# Patient Record
Sex: Female | Born: 1955 | Race: Black or African American | Hispanic: No | Smoking: Never smoker
Health system: Southern US, Community
[De-identification: ages and names within clinical notes are randomized; demographics above are authoritative.]

## PROBLEM LIST (undated history)

## (undated) DIAGNOSIS — I10 Essential (primary) hypertension: Secondary | ICD-10-CM

## (undated) DIAGNOSIS — D649 Anemia, unspecified: Secondary | ICD-10-CM

## (undated) DIAGNOSIS — M2550 Pain in unspecified joint: Secondary | ICD-10-CM

## (undated) DIAGNOSIS — M24112 Other articular cartilage disorders, left shoulder: Secondary | ICD-10-CM

## (undated) DIAGNOSIS — Z8619 Personal history of other infectious and parasitic diseases: Secondary | ICD-10-CM

## (undated) DIAGNOSIS — G43909 Migraine, unspecified, not intractable, without status migrainosus: Secondary | ICD-10-CM

## (undated) DIAGNOSIS — M0520 Rheumatoid vasculitis with rheumatoid arthritis of unspecified site: Secondary | ICD-10-CM

## (undated) DIAGNOSIS — R0681 Apnea, not elsewhere classified: Secondary | ICD-10-CM

## (undated) DIAGNOSIS — E119 Type 2 diabetes mellitus without complications: Secondary | ICD-10-CM

## (undated) DIAGNOSIS — T8859XA Other complications of anesthesia, initial encounter: Secondary | ICD-10-CM

## (undated) DIAGNOSIS — Z98811 Dental restoration status: Secondary | ICD-10-CM

## (undated) DIAGNOSIS — G5600 Carpal tunnel syndrome, unspecified upper limb: Secondary | ICD-10-CM

## (undated) DIAGNOSIS — M549 Dorsalgia, unspecified: Secondary | ICD-10-CM

## (undated) DIAGNOSIS — M7500 Adhesive capsulitis of unspecified shoulder: Secondary | ICD-10-CM

## (undated) DIAGNOSIS — M65332 Trigger finger, left middle finger: Secondary | ICD-10-CM

## (undated) DIAGNOSIS — E785 Hyperlipidemia, unspecified: Secondary | ICD-10-CM

## (undated) DIAGNOSIS — E669 Obesity, unspecified: Secondary | ICD-10-CM

## (undated) DIAGNOSIS — R011 Cardiac murmur, unspecified: Secondary | ICD-10-CM

## (undated) DIAGNOSIS — M51369 Other intervertebral disc degeneration, lumbar region without mention of lumbar back pain or lower extremity pain: Secondary | ICD-10-CM

## (undated) DIAGNOSIS — Z9289 Personal history of other medical treatment: Secondary | ICD-10-CM

## (undated) DIAGNOSIS — Z972 Presence of dental prosthetic device (complete) (partial): Secondary | ICD-10-CM

## (undated) DIAGNOSIS — M7542 Impingement syndrome of left shoulder: Secondary | ICD-10-CM

## (undated) DIAGNOSIS — E559 Vitamin D deficiency, unspecified: Secondary | ICD-10-CM

## (undated) DIAGNOSIS — M5136 Other intervertebral disc degeneration, lumbar region: Secondary | ICD-10-CM

## (undated) DIAGNOSIS — K219 Gastro-esophageal reflux disease without esophagitis: Secondary | ICD-10-CM

## (undated) DIAGNOSIS — R7303 Prediabetes: Secondary | ICD-10-CM

## (undated) DIAGNOSIS — T4145XA Adverse effect of unspecified anesthetic, initial encounter: Secondary | ICD-10-CM

## (undated) DIAGNOSIS — J302 Other seasonal allergic rhinitis: Secondary | ICD-10-CM

## (undated) DIAGNOSIS — G4730 Sleep apnea, unspecified: Secondary | ICD-10-CM

## (undated) DIAGNOSIS — R600 Localized edema: Secondary | ICD-10-CM

## (undated) HISTORY — DX: Essential (primary) hypertension: I10

## (undated) HISTORY — DX: Type 2 diabetes mellitus without complications: E11.9

## (undated) HISTORY — PX: COLONOSCOPY: SHX174

## (undated) HISTORY — DX: Apnea, not elsewhere classified: R06.81

## (undated) HISTORY — DX: Anemia, unspecified: D64.9

## (undated) HISTORY — DX: Personal history of other medical treatment: Z92.89

## (undated) HISTORY — DX: Dorsalgia, unspecified: M54.9

## (undated) HISTORY — PX: POLYPECTOMY: SHX149

## (undated) HISTORY — DX: Obesity, unspecified: E66.9

## (undated) HISTORY — DX: Rheumatoid vasculitis with rheumatoid arthritis of unspecified site: M05.20

## (undated) HISTORY — DX: Hyperlipidemia, unspecified: E78.5

## (undated) HISTORY — PX: TUBAL LIGATION: SHX77

## (undated) HISTORY — DX: Carpal tunnel syndrome, unspecified upper limb: G56.00

## (undated) HISTORY — DX: Gastro-esophageal reflux disease without esophagitis: K21.9

## (undated) HISTORY — DX: Pain in unspecified joint: M25.50

---

## 1997-11-04 ENCOUNTER — Ambulatory Visit (HOSPITAL_COMMUNITY): Admission: RE | Admit: 1997-11-04 | Discharge: 1997-11-04 | Payer: Self-pay | Admitting: Obstetrics

## 1997-11-09 ENCOUNTER — Other Ambulatory Visit: Admission: RE | Admit: 1997-11-09 | Discharge: 1997-11-09 | Payer: Self-pay | Admitting: Obstetrics & Gynecology

## 1997-11-12 ENCOUNTER — Ambulatory Visit (HOSPITAL_COMMUNITY): Admission: RE | Admit: 1997-11-12 | Discharge: 1997-11-12 | Payer: Self-pay | Admitting: Obstetrics

## 1997-11-18 ENCOUNTER — Inpatient Hospital Stay (HOSPITAL_COMMUNITY): Admission: RE | Admit: 1997-11-18 | Discharge: 1997-11-21 | Payer: Self-pay | Admitting: Obstetrics & Gynecology

## 1997-11-30 ENCOUNTER — Encounter: Admission: RE | Admit: 1997-11-30 | Discharge: 1997-11-30 | Payer: Self-pay | Admitting: Obstetrics & Gynecology

## 1997-12-28 ENCOUNTER — Encounter: Admission: RE | Admit: 1997-12-28 | Discharge: 1997-12-28 | Payer: Self-pay | Admitting: Obstetrics & Gynecology

## 1998-02-22 ENCOUNTER — Encounter: Admission: RE | Admit: 1998-02-22 | Discharge: 1998-02-22 | Payer: Self-pay | Admitting: Obstetrics & Gynecology

## 1998-05-06 ENCOUNTER — Ambulatory Visit (HOSPITAL_COMMUNITY): Admission: RE | Admit: 1998-05-06 | Discharge: 1998-05-06 | Payer: Self-pay | Admitting: Family Medicine

## 1998-05-06 ENCOUNTER — Encounter: Admission: RE | Admit: 1998-05-06 | Discharge: 1998-05-06 | Payer: Self-pay | Admitting: Family Medicine

## 1998-05-13 ENCOUNTER — Encounter: Admission: RE | Admit: 1998-05-13 | Discharge: 1998-05-13 | Payer: Self-pay | Admitting: Family Medicine

## 1998-05-24 ENCOUNTER — Ambulatory Visit (HOSPITAL_COMMUNITY): Admission: RE | Admit: 1998-05-24 | Discharge: 1998-05-24 | Payer: Self-pay | Admitting: Family Medicine

## 1998-06-08 ENCOUNTER — Encounter: Payer: Self-pay | Admitting: Family Medicine

## 1998-06-08 ENCOUNTER — Ambulatory Visit (HOSPITAL_COMMUNITY): Admission: RE | Admit: 1998-06-08 | Discharge: 1998-06-08 | Payer: Self-pay | Admitting: Family Medicine

## 1998-06-13 ENCOUNTER — Encounter: Admission: RE | Admit: 1998-06-13 | Discharge: 1998-06-13 | Payer: Self-pay | Admitting: Family Medicine

## 1998-07-08 ENCOUNTER — Encounter: Admission: RE | Admit: 1998-07-08 | Discharge: 1998-07-08 | Payer: Self-pay | Admitting: Family Medicine

## 1998-07-13 ENCOUNTER — Encounter: Admission: RE | Admit: 1998-07-13 | Discharge: 1998-07-13 | Payer: Self-pay | Admitting: Family Medicine

## 1998-07-27 ENCOUNTER — Encounter: Admission: RE | Admit: 1998-07-27 | Discharge: 1998-07-27 | Payer: Self-pay | Admitting: Family Medicine

## 1998-08-09 ENCOUNTER — Encounter: Admission: RE | Admit: 1998-08-09 | Discharge: 1998-11-07 | Payer: Self-pay | Admitting: Orthopedic Surgery

## 1998-11-18 ENCOUNTER — Ambulatory Visit (HOSPITAL_COMMUNITY): Admission: RE | Admit: 1998-11-18 | Discharge: 1998-11-18 | Payer: Self-pay | Admitting: Obstetrics

## 1998-11-18 ENCOUNTER — Encounter: Admission: RE | Admit: 1998-11-18 | Discharge: 1998-11-18 | Payer: Self-pay | Admitting: Obstetrics & Gynecology

## 1998-11-18 ENCOUNTER — Encounter: Payer: Self-pay | Admitting: Obstetrics

## 1998-12-26 ENCOUNTER — Ambulatory Visit (HOSPITAL_COMMUNITY): Admission: RE | Admit: 1998-12-26 | Discharge: 1998-12-26 | Payer: Self-pay | Admitting: *Deleted

## 1998-12-27 ENCOUNTER — Ambulatory Visit (HOSPITAL_BASED_OUTPATIENT_CLINIC_OR_DEPARTMENT_OTHER): Admission: RE | Admit: 1998-12-27 | Discharge: 1998-12-27 | Payer: Self-pay | Admitting: Orthopedic Surgery

## 1999-01-05 ENCOUNTER — Encounter: Admission: RE | Admit: 1999-01-05 | Discharge: 1999-03-10 | Payer: Self-pay | Admitting: Orthopedic Surgery

## 1999-02-10 ENCOUNTER — Ambulatory Visit (HOSPITAL_COMMUNITY): Admission: RE | Admit: 1999-02-10 | Discharge: 1999-02-10 | Payer: Self-pay | Admitting: *Deleted

## 1999-08-21 HISTORY — PX: VAGINAL HYSTERECTOMY: SUR661

## 2000-01-26 ENCOUNTER — Encounter: Payer: Self-pay | Admitting: Sports Medicine

## 2000-01-26 ENCOUNTER — Ambulatory Visit (HOSPITAL_COMMUNITY): Admission: RE | Admit: 2000-01-26 | Discharge: 2000-01-26 | Payer: Self-pay | Admitting: Obstetrics

## 2000-02-20 ENCOUNTER — Encounter: Admission: RE | Admit: 2000-02-20 | Discharge: 2000-02-20 | Payer: Self-pay | Admitting: Family Medicine

## 2000-05-17 ENCOUNTER — Encounter: Admission: RE | Admit: 2000-05-17 | Discharge: 2000-05-17 | Payer: Self-pay | Admitting: Family Medicine

## 2001-01-26 ENCOUNTER — Inpatient Hospital Stay (HOSPITAL_COMMUNITY): Admission: AD | Admit: 2001-01-26 | Discharge: 2001-01-26 | Payer: Self-pay | Admitting: *Deleted

## 2001-01-27 ENCOUNTER — Inpatient Hospital Stay (HOSPITAL_COMMUNITY): Admission: AD | Admit: 2001-01-27 | Discharge: 2001-01-27 | Payer: Self-pay | Admitting: Obstetrics & Gynecology

## 2001-01-27 ENCOUNTER — Encounter: Payer: Self-pay | Admitting: Obstetrics & Gynecology

## 2001-02-11 ENCOUNTER — Encounter: Admission: RE | Admit: 2001-02-11 | Discharge: 2001-02-11 | Payer: Self-pay | Admitting: Obstetrics & Gynecology

## 2001-02-25 ENCOUNTER — Ambulatory Visit (HOSPITAL_COMMUNITY): Admission: RE | Admit: 2001-02-25 | Discharge: 2001-02-25 | Payer: Self-pay | Admitting: Sports Medicine

## 2001-04-10 ENCOUNTER — Ambulatory Visit (HOSPITAL_COMMUNITY): Admission: RE | Admit: 2001-04-10 | Discharge: 2001-04-10 | Payer: Self-pay | Admitting: Obstetrics & Gynecology

## 2001-04-10 ENCOUNTER — Encounter: Payer: Self-pay | Admitting: Obstetrics & Gynecology

## 2001-05-26 ENCOUNTER — Encounter: Payer: Self-pay | Admitting: Emergency Medicine

## 2001-05-26 ENCOUNTER — Emergency Department (HOSPITAL_COMMUNITY): Admission: EM | Admit: 2001-05-26 | Discharge: 2001-05-26 | Payer: Self-pay | Admitting: Emergency Medicine

## 2001-09-30 ENCOUNTER — Ambulatory Visit (HOSPITAL_COMMUNITY): Admission: RE | Admit: 2001-09-30 | Discharge: 2001-09-30 | Payer: Self-pay | Admitting: Internal Medicine

## 2001-09-30 ENCOUNTER — Encounter: Payer: Self-pay | Admitting: Internal Medicine

## 2002-02-19 ENCOUNTER — Encounter: Payer: Self-pay | Admitting: Internal Medicine

## 2002-02-19 ENCOUNTER — Ambulatory Visit (HOSPITAL_COMMUNITY): Admission: RE | Admit: 2002-02-19 | Discharge: 2002-02-19 | Payer: Self-pay | Admitting: Internal Medicine

## 2002-03-17 ENCOUNTER — Ambulatory Visit (HOSPITAL_COMMUNITY): Admission: RE | Admit: 2002-03-17 | Discharge: 2002-03-17 | Payer: Self-pay | Admitting: *Deleted

## 2002-03-17 ENCOUNTER — Encounter: Payer: Self-pay | Admitting: *Deleted

## 2002-04-02 ENCOUNTER — Encounter: Payer: Self-pay | Admitting: Internal Medicine

## 2002-04-02 ENCOUNTER — Ambulatory Visit (HOSPITAL_COMMUNITY): Admission: RE | Admit: 2002-04-02 | Discharge: 2002-04-02 | Payer: Self-pay | Admitting: Internal Medicine

## 2002-04-24 ENCOUNTER — Emergency Department (HOSPITAL_COMMUNITY): Admission: EM | Admit: 2002-04-24 | Discharge: 2002-04-24 | Payer: Self-pay

## 2002-06-25 ENCOUNTER — Encounter: Admission: RE | Admit: 2002-06-25 | Discharge: 2002-06-25 | Payer: Self-pay | Admitting: Family Medicine

## 2003-02-03 ENCOUNTER — Encounter: Admission: RE | Admit: 2003-02-03 | Discharge: 2003-02-03 | Payer: Self-pay | Admitting: Family Medicine

## 2003-03-26 ENCOUNTER — Encounter: Admission: RE | Admit: 2003-03-26 | Discharge: 2003-03-26 | Payer: Self-pay | Admitting: Family Medicine

## 2003-08-31 ENCOUNTER — Emergency Department (HOSPITAL_COMMUNITY): Admission: AD | Admit: 2003-08-31 | Discharge: 2003-08-31 | Payer: Self-pay | Admitting: Family Medicine

## 2003-11-01 ENCOUNTER — Emergency Department (HOSPITAL_COMMUNITY): Admission: EM | Admit: 2003-11-01 | Discharge: 2003-11-01 | Payer: Self-pay | Admitting: Family Medicine

## 2003-12-08 ENCOUNTER — Emergency Department (HOSPITAL_COMMUNITY): Admission: EM | Admit: 2003-12-08 | Discharge: 2003-12-09 | Payer: Self-pay | Admitting: Podiatry

## 2003-12-09 ENCOUNTER — Encounter: Admission: RE | Admit: 2003-12-09 | Discharge: 2003-12-09 | Payer: Self-pay | Admitting: Family Medicine

## 2004-02-23 ENCOUNTER — Ambulatory Visit (HOSPITAL_COMMUNITY): Admission: RE | Admit: 2004-02-23 | Discharge: 2004-02-23 | Payer: Self-pay | Admitting: Obstetrics & Gynecology

## 2004-06-27 ENCOUNTER — Emergency Department (HOSPITAL_COMMUNITY): Admission: EM | Admit: 2004-06-27 | Discharge: 2004-06-27 | Payer: Self-pay | Admitting: Family Medicine

## 2004-10-03 ENCOUNTER — Ambulatory Visit: Payer: Self-pay | Admitting: Sports Medicine

## 2005-01-10 ENCOUNTER — Ambulatory Visit: Payer: Self-pay | Admitting: *Deleted

## 2005-01-18 ENCOUNTER — Encounter (INDEPENDENT_AMBULATORY_CARE_PROVIDER_SITE_OTHER): Payer: Self-pay | Admitting: *Deleted

## 2005-01-18 LAB — CONVERTED CEMR LAB

## 2005-01-19 ENCOUNTER — Ambulatory Visit: Payer: Self-pay | Admitting: Family Medicine

## 2005-02-26 ENCOUNTER — Ambulatory Visit: Payer: Self-pay | Admitting: Family Medicine

## 2005-08-21 ENCOUNTER — Ambulatory Visit (HOSPITAL_COMMUNITY): Admission: RE | Admit: 2005-08-21 | Discharge: 2005-08-21 | Payer: Self-pay | Admitting: Obstetrics & Gynecology

## 2005-08-27 ENCOUNTER — Ambulatory Visit: Payer: Self-pay | Admitting: Family Medicine

## 2005-09-06 ENCOUNTER — Ambulatory Visit: Payer: Self-pay | Admitting: Family Medicine

## 2005-09-08 ENCOUNTER — Emergency Department (HOSPITAL_COMMUNITY): Admission: EM | Admit: 2005-09-08 | Discharge: 2005-09-08 | Payer: Self-pay | Admitting: Family Medicine

## 2005-09-10 ENCOUNTER — Ambulatory Visit: Payer: Self-pay | Admitting: Sports Medicine

## 2005-09-30 ENCOUNTER — Emergency Department (HOSPITAL_COMMUNITY): Admission: EM | Admit: 2005-09-30 | Discharge: 2005-09-30 | Payer: Self-pay | Admitting: Family Medicine

## 2005-10-10 ENCOUNTER — Ambulatory Visit: Payer: Self-pay | Admitting: Sports Medicine

## 2006-01-08 ENCOUNTER — Ambulatory Visit: Payer: Self-pay | Admitting: Sports Medicine

## 2006-01-10 ENCOUNTER — Ambulatory Visit: Payer: Self-pay | Admitting: Sports Medicine

## 2006-01-11 ENCOUNTER — Emergency Department (HOSPITAL_COMMUNITY): Admission: EM | Admit: 2006-01-11 | Discharge: 2006-01-11 | Payer: Self-pay | Admitting: Family Medicine

## 2006-01-29 ENCOUNTER — Encounter: Admission: RE | Admit: 2006-01-29 | Discharge: 2006-01-29 | Payer: Self-pay | Admitting: Occupational Medicine

## 2006-02-13 ENCOUNTER — Emergency Department (HOSPITAL_COMMUNITY): Admission: EM | Admit: 2006-02-13 | Discharge: 2006-02-13 | Payer: Self-pay | Admitting: Emergency Medicine

## 2006-02-19 ENCOUNTER — Encounter: Payer: Self-pay | Admitting: Vascular Surgery

## 2006-02-19 ENCOUNTER — Ambulatory Visit (HOSPITAL_COMMUNITY): Admission: RE | Admit: 2006-02-19 | Discharge: 2006-02-19 | Payer: Self-pay | Admitting: Internal Medicine

## 2006-02-28 ENCOUNTER — Ambulatory Visit: Payer: Self-pay | Admitting: Family Medicine

## 2006-04-25 ENCOUNTER — Ambulatory Visit (HOSPITAL_BASED_OUTPATIENT_CLINIC_OR_DEPARTMENT_OTHER): Admission: RE | Admit: 2006-04-25 | Discharge: 2006-04-25 | Payer: Self-pay | Admitting: Orthopedic Surgery

## 2006-04-25 HISTORY — PX: CARPAL TUNNEL RELEASE: SHX101

## 2006-08-16 ENCOUNTER — Emergency Department (HOSPITAL_COMMUNITY): Admission: EM | Admit: 2006-08-16 | Discharge: 2006-08-16 | Payer: Self-pay | Admitting: Emergency Medicine

## 2006-08-21 ENCOUNTER — Emergency Department (HOSPITAL_COMMUNITY): Admission: EM | Admit: 2006-08-21 | Discharge: 2006-08-21 | Payer: Self-pay | Admitting: Family Medicine

## 2006-09-05 ENCOUNTER — Ambulatory Visit: Payer: Self-pay | Admitting: Family Medicine

## 2006-10-17 DIAGNOSIS — G5601 Carpal tunnel syndrome, right upper limb: Secondary | ICD-10-CM | POA: Insufficient documentation

## 2006-10-17 DIAGNOSIS — E669 Obesity, unspecified: Secondary | ICD-10-CM | POA: Insufficient documentation

## 2006-10-17 DIAGNOSIS — D509 Iron deficiency anemia, unspecified: Secondary | ICD-10-CM | POA: Insufficient documentation

## 2006-10-18 ENCOUNTER — Encounter (INDEPENDENT_AMBULATORY_CARE_PROVIDER_SITE_OTHER): Payer: Self-pay | Admitting: *Deleted

## 2006-11-28 ENCOUNTER — Ambulatory Visit (HOSPITAL_COMMUNITY): Admission: RE | Admit: 2006-11-28 | Discharge: 2006-11-28 | Payer: Self-pay | Admitting: Obstetrics & Gynecology

## 2007-02-25 ENCOUNTER — Ambulatory Visit: Payer: Self-pay | Admitting: Gastroenterology

## 2007-08-22 ENCOUNTER — Emergency Department (HOSPITAL_COMMUNITY): Admission: EM | Admit: 2007-08-22 | Discharge: 2007-08-22 | Payer: Self-pay | Admitting: Emergency Medicine

## 2007-08-25 ENCOUNTER — Encounter (INDEPENDENT_AMBULATORY_CARE_PROVIDER_SITE_OTHER): Payer: Self-pay | Admitting: Family Medicine

## 2007-08-25 ENCOUNTER — Ambulatory Visit: Payer: Self-pay | Admitting: Sports Medicine

## 2007-08-29 ENCOUNTER — Telehealth (INDEPENDENT_AMBULATORY_CARE_PROVIDER_SITE_OTHER): Payer: Self-pay | Admitting: Family Medicine

## 2007-08-29 LAB — CONVERTED CEMR LAB
BUN: 13 mg/dL (ref 6–23)
Calcium: 8.8 mg/dL (ref 8.4–10.5)

## 2007-09-02 ENCOUNTER — Ambulatory Visit: Payer: Self-pay | Admitting: Family Medicine

## 2007-09-02 ENCOUNTER — Encounter (INDEPENDENT_AMBULATORY_CARE_PROVIDER_SITE_OTHER): Payer: Self-pay | Admitting: Family Medicine

## 2007-09-02 DIAGNOSIS — E2839 Other primary ovarian failure: Secondary | ICD-10-CM | POA: Insufficient documentation

## 2007-09-02 DIAGNOSIS — E559 Vitamin D deficiency, unspecified: Secondary | ICD-10-CM | POA: Insufficient documentation

## 2007-09-30 ENCOUNTER — Encounter: Payer: Self-pay | Admitting: *Deleted

## 2007-10-08 ENCOUNTER — Encounter (INDEPENDENT_AMBULATORY_CARE_PROVIDER_SITE_OTHER): Payer: Self-pay | Admitting: Family Medicine

## 2008-01-07 ENCOUNTER — Telehealth: Payer: Self-pay | Admitting: *Deleted

## 2008-01-07 ENCOUNTER — Ambulatory Visit: Payer: Self-pay | Admitting: Family Medicine

## 2008-01-21 ENCOUNTER — Encounter: Payer: Self-pay | Admitting: *Deleted

## 2008-03-04 ENCOUNTER — Ambulatory Visit (HOSPITAL_COMMUNITY): Admission: RE | Admit: 2008-03-04 | Discharge: 2008-03-04 | Payer: Self-pay | Admitting: Obstetrics & Gynecology

## 2008-03-09 ENCOUNTER — Encounter (INDEPENDENT_AMBULATORY_CARE_PROVIDER_SITE_OTHER): Payer: Self-pay | Admitting: Family Medicine

## 2008-03-09 ENCOUNTER — Ambulatory Visit: Payer: Self-pay | Admitting: Family Medicine

## 2008-03-09 DIAGNOSIS — G43711 Chronic migraine without aura, intractable, with status migrainosus: Secondary | ICD-10-CM | POA: Insufficient documentation

## 2008-03-09 LAB — CONVERTED CEMR LAB
BUN: 11 mg/dL (ref 6–23)
Bilirubin Urine: NEGATIVE
Calcium: 9 mg/dL (ref 8.4–10.5)
Glucose, Urine, Semiquant: NEGATIVE
Ketones, urine, test strip: NEGATIVE
Potassium: 4.3 meq/L (ref 3.5–5.3)
Protein, U semiquant: NEGATIVE
Specific Gravity, Urine: 1.02
Vit D, 1,25-Dihydroxy: 20 — ABNORMAL LOW (ref 30–89)

## 2008-03-11 ENCOUNTER — Encounter (INDEPENDENT_AMBULATORY_CARE_PROVIDER_SITE_OTHER): Payer: Self-pay | Admitting: Family Medicine

## 2008-03-12 ENCOUNTER — Telehealth: Payer: Self-pay | Admitting: *Deleted

## 2008-03-16 ENCOUNTER — Encounter (INDEPENDENT_AMBULATORY_CARE_PROVIDER_SITE_OTHER): Payer: Self-pay | Admitting: Family Medicine

## 2008-07-26 ENCOUNTER — Emergency Department (HOSPITAL_COMMUNITY): Admission: EM | Admit: 2008-07-26 | Discharge: 2008-07-26 | Payer: Self-pay | Admitting: Emergency Medicine

## 2008-08-20 HISTORY — PX: ROTATOR CUFF REPAIR: SHX139

## 2008-08-24 ENCOUNTER — Ambulatory Visit: Payer: Self-pay | Admitting: Sports Medicine

## 2008-09-09 ENCOUNTER — Ambulatory Visit: Payer: Self-pay | Admitting: Sports Medicine

## 2008-09-14 ENCOUNTER — Encounter (INDEPENDENT_AMBULATORY_CARE_PROVIDER_SITE_OTHER): Payer: Self-pay | Admitting: *Deleted

## 2008-09-16 ENCOUNTER — Ambulatory Visit (HOSPITAL_COMMUNITY): Admission: RE | Admit: 2008-09-16 | Discharge: 2008-09-16 | Payer: Self-pay | Admitting: Sports Medicine

## 2008-09-22 ENCOUNTER — Encounter (INDEPENDENT_AMBULATORY_CARE_PROVIDER_SITE_OTHER): Payer: Self-pay | Admitting: *Deleted

## 2008-09-30 ENCOUNTER — Encounter (INDEPENDENT_AMBULATORY_CARE_PROVIDER_SITE_OTHER): Payer: Self-pay | Admitting: *Deleted

## 2008-10-04 ENCOUNTER — Ambulatory Visit (HOSPITAL_BASED_OUTPATIENT_CLINIC_OR_DEPARTMENT_OTHER): Admission: RE | Admit: 2008-10-04 | Discharge: 2008-10-04 | Payer: Self-pay | Admitting: Orthopedic Surgery

## 2008-10-04 HISTORY — PX: SHOULDER ARTHROSCOPY WITH ROTATOR CUFF REPAIR: SHX5685

## 2008-10-06 ENCOUNTER — Encounter: Admission: RE | Admit: 2008-10-06 | Discharge: 2009-01-04 | Payer: Self-pay | Admitting: Orthopedic Surgery

## 2008-10-08 ENCOUNTER — Telehealth: Payer: Self-pay | Admitting: Family Medicine

## 2008-10-08 ENCOUNTER — Encounter: Payer: Self-pay | Admitting: Family Medicine

## 2008-10-08 ENCOUNTER — Ambulatory Visit: Payer: Self-pay | Admitting: Vascular Surgery

## 2008-10-08 ENCOUNTER — Ambulatory Visit: Admission: RE | Admit: 2008-10-08 | Discharge: 2008-10-08 | Payer: Self-pay | Admitting: Family Medicine

## 2008-10-08 ENCOUNTER — Ambulatory Visit: Payer: Self-pay | Admitting: Family Medicine

## 2008-10-08 DIAGNOSIS — R609 Edema, unspecified: Secondary | ICD-10-CM | POA: Insufficient documentation

## 2008-11-22 ENCOUNTER — Encounter (INDEPENDENT_AMBULATORY_CARE_PROVIDER_SITE_OTHER): Payer: Self-pay | Admitting: Family Medicine

## 2008-11-22 ENCOUNTER — Ambulatory Visit: Payer: Self-pay | Admitting: Family Medicine

## 2008-11-22 ENCOUNTER — Ambulatory Visit: Payer: Self-pay

## 2008-11-22 LAB — CONVERTED CEMR LAB
ALT: 11 units/L (ref 0–35)
AST: 15 units/L (ref 0–37)
Blood in Urine, dipstick: NEGATIVE
Chloride: 107 meq/L (ref 96–112)
Creatinine, Ser: 0.99 mg/dL (ref 0.40–1.20)
Ketones, urine, test strip: NEGATIVE
Specific Gravity, Urine: 1.02
Total Bilirubin: 0.3 mg/dL (ref 0.3–1.2)
Total Protein: 7.1 g/dL (ref 6.0–8.3)
pH: 7

## 2008-11-23 ENCOUNTER — Telehealth (INDEPENDENT_AMBULATORY_CARE_PROVIDER_SITE_OTHER): Payer: Self-pay | Admitting: Family Medicine

## 2008-11-23 ENCOUNTER — Encounter: Payer: Self-pay | Admitting: Sports Medicine

## 2008-11-25 ENCOUNTER — Encounter: Payer: Self-pay | Admitting: Cardiovascular Disease

## 2008-11-25 ENCOUNTER — Ambulatory Visit: Payer: Self-pay | Admitting: Cardiovascular Disease

## 2008-11-25 LAB — CONVERTED CEMR LAB
Basophils Absolute: 0 10*3/uL (ref 0.0–0.1)
Basophils Relative: 0 % (ref 0.0–3.0)
Eosinophils Absolute: 0.1 10*3/uL (ref 0.0–0.7)
Free T4: 0.8 ng/dL (ref 0.6–1.6)
HCT: 39.3 % (ref 36.0–46.0)
Hemoglobin: 12.6 g/dL (ref 12.0–15.0)
Lymphocytes Relative: 33.6 % (ref 12.0–46.0)
Monocytes Relative: 6.7 % (ref 3.0–12.0)
RBC: 5.56 M/uL — ABNORMAL HIGH (ref 3.87–5.11)
Sed Rate: 17 mm/hr (ref 0–22)

## 2009-01-05 ENCOUNTER — Encounter: Admission: RE | Admit: 2009-01-05 | Discharge: 2009-01-20 | Payer: Self-pay | Admitting: Orthopedic Surgery

## 2009-04-11 ENCOUNTER — Ambulatory Visit: Payer: Self-pay | Admitting: Cardiovascular Disease

## 2009-04-11 DIAGNOSIS — I10 Essential (primary) hypertension: Secondary | ICD-10-CM | POA: Insufficient documentation

## 2009-04-20 LAB — CONVERTED CEMR LAB
Chloride: 107 meq/L (ref 96–112)
Creatinine, Ser: 0.9 mg/dL (ref 0.4–1.2)
Potassium: 3.6 meq/L (ref 3.5–5.1)

## 2009-05-27 ENCOUNTER — Ambulatory Visit (HOSPITAL_COMMUNITY): Admission: RE | Admit: 2009-05-27 | Discharge: 2009-05-27 | Payer: Self-pay | Admitting: Obstetrics & Gynecology

## 2009-07-02 ENCOUNTER — Emergency Department (HOSPITAL_COMMUNITY): Admission: EM | Admit: 2009-07-02 | Discharge: 2009-07-02 | Payer: Self-pay | Admitting: Family Medicine

## 2009-07-04 ENCOUNTER — Ambulatory Visit: Payer: Self-pay | Admitting: Family Medicine

## 2009-07-04 ENCOUNTER — Encounter: Payer: Self-pay | Admitting: Family Medicine

## 2009-07-07 ENCOUNTER — Encounter: Payer: Self-pay | Admitting: Family Medicine

## 2009-07-07 ENCOUNTER — Ambulatory Visit: Payer: Self-pay | Admitting: Family Medicine

## 2009-07-07 LAB — CONVERTED CEMR LAB
ALT: 14 units/L (ref 0–35)
AST: 25 units/L (ref 0–37)
Albumin: 3.9 g/dL (ref 3.5–5.2)
Alkaline Phosphatase: 96 units/L (ref 39–117)
Calcium: 9.1 mg/dL (ref 8.4–10.5)
Creatinine, Ser: 0.85 mg/dL (ref 0.40–1.20)
Glucose, Bld: 81 mg/dL (ref 70–99)
HCT: 38.8 % (ref 36.0–46.0)
Hemoglobin: 11.9 g/dL — ABNORMAL LOW (ref 12.0–15.0)
MCHC: 30.7 g/dL (ref 30.0–36.0)
RBC: 5.56 M/uL — ABNORMAL HIGH (ref 3.87–5.11)
RDW: 15.4 % (ref 11.5–15.5)
Total Bilirubin: 0.3 mg/dL (ref 0.3–1.2)
WBC: 9.5 10*3/uL (ref 4.0–10.5)

## 2009-09-26 ENCOUNTER — Ambulatory Visit: Payer: Self-pay | Admitting: Family Medicine

## 2009-09-26 ENCOUNTER — Encounter: Payer: Self-pay | Admitting: Family Medicine

## 2009-09-26 LAB — CONVERTED CEMR LAB
Glucose, Urine, Semiquant: NEGATIVE
HDL: 38 mg/dL — ABNORMAL LOW (ref >39)
Ketones, urine, test strip: NEGATIVE
Protein, U semiquant: NEGATIVE
Urobilinogen, UA: 1
pH: 6.5

## 2009-12-28 ENCOUNTER — Ambulatory Visit: Payer: Self-pay | Admitting: Sports Medicine

## 2010-01-05 ENCOUNTER — Ambulatory Visit: Payer: Self-pay | Admitting: Sports Medicine

## 2010-01-21 ENCOUNTER — Emergency Department (HOSPITAL_COMMUNITY): Admission: EM | Admit: 2010-01-21 | Discharge: 2010-01-21 | Payer: Self-pay | Admitting: Emergency Medicine

## 2010-01-24 ENCOUNTER — Encounter: Admission: RE | Admit: 2010-01-24 | Discharge: 2010-03-01 | Payer: Self-pay | Admitting: Sports Medicine

## 2010-01-24 ENCOUNTER — Encounter: Payer: Self-pay | Admitting: Family Medicine

## 2010-01-26 ENCOUNTER — Telehealth: Payer: Self-pay | Admitting: Family Medicine

## 2010-03-13 ENCOUNTER — Ambulatory Visit: Payer: Self-pay | Admitting: Family Medicine

## 2010-03-30 ENCOUNTER — Ambulatory Visit: Payer: Self-pay | Admitting: Sports Medicine

## 2010-06-28 ENCOUNTER — Encounter: Payer: Self-pay | Admitting: Family Medicine

## 2010-07-04 ENCOUNTER — Ambulatory Visit (HOSPITAL_COMMUNITY): Admission: RE | Admit: 2010-07-04 | Discharge: 2010-07-04 | Payer: Self-pay | Admitting: Obstetrics & Gynecology

## 2010-08-01 ENCOUNTER — Emergency Department (HOSPITAL_COMMUNITY)
Admission: EM | Admit: 2010-08-01 | Discharge: 2010-08-01 | Payer: Self-pay | Source: Home / Self Care | Admitting: Emergency Medicine

## 2010-09-10 ENCOUNTER — Encounter: Payer: Self-pay | Admitting: Obstetrics & Gynecology
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
## 2010-09-21 NOTE — Assessment & Plan Note (Signed)
Summary: F/U BACK/L LEG,[facility]   Vital Signs:  Patient profile:   55 year old female BP sitting:   128 / 84  Primary Provider:  Cat Ta MD   History of Present Illness: Mobic helped pain a lot flexeril - can only take at nite 2/2 drowsiness feels that she is moving beter pain in LB still radiates into left leg stops at knee no weakness no red flags  here for reck  Allergies: No Known Drug Allergies  Physical Exam  General:  Obese,well-nourished,in no acute distress; alert,appropriate and cooperative throughout examination Msk:  + SLR on left neg on RT pain w flex at 80 deg with ext at 20 deg some on lat bending none on Rotation  toe, heel and tandem walk OK  reflex dec at left knee and ankle and at rt knee   Impression & Recommendations:  Problem # 1:  LOW BACK PAIN, ACUTE (ICD-724.2)  The following medications were removed from the medication list:    Naprosyn 500 Mg Tabs (Naproxen) .[name]... 1 tablet by mouth twice a day Her updated medication list for this problem includes:    Tgt Ibuprofen 200 Mg Tabs (Ibuprofen) .[name]... Take 2 tablet by mouth every six hours    Vicodin 5-500 Mg Tabs (Hydrocodone-acetaminophen) .[name]... 1 tablet by mouth every six hours    Flexeril 10 Mg Tabs (Cyclobenzaprine hcl) .[name]... 1 by mouth qhs    Flexeril 10 Mg Tabs (Cyclobenzaprine hcl) .[name]... 1 tab by mouth qhs as needed spasm    Mobic 15 Mg Tabs (Meloxicam) .[name]... 1 by mouth with food as needed back pain  this is improved but I think she needs to stay on some mobic to work  Problem # 2:  LUMBAR RADICULOPATHY, LEFT (ICD-724.4)  The following medications were removed from the medication list:    Naprosyn 500 Mg Tabs (Naproxen) .[name]... 1 tablet by mouth twice a day Her updated medication list for this problem includes:    Tgt Ibuprofen 200 Mg Tabs (Ibuprofen) .[name]... Take 2 tablet by mouth every six hours    Vicodin 5-500 Mg Tabs (Hydrocodone-acetaminophen) .[name]... 1 tablet by mouth every six  hours    Flexeril 10 Mg Tabs (Cyclobenzaprine hcl) .[name]... 1 by mouth qhs    Flexeril 10 Mg Tabs (Cyclobenzaprine hcl) .[name]... 1 tab by mouth qhs as needed spasm    Mobic 15 Mg Tabs (Meloxicam) .[name]... 1 by mouth with food as needed back pain  cont on flexeril at night  begin PT  I don't think imaging would help unless worse  this seems disk related not a good surg candiadate  reck by dr Fredric Mare 4 wks  Complete Medication List: 1)  Protonix 40 Mg Tbec (Pantoprazole sodium) .... Take 1 tablet by mouth once a day 2)  Tgt Ibuprofen 200 Mg Tabs (Ibuprofen) .... Take 2 tablet by mouth every six hours 3)  Vicodin 5-500 Mg Tabs (Hydrocodone-acetaminophen) .[name].. 1 tablet by mouth every six hours 4)  Imitrex 25 Mg Tabs (Sumatriptan succinate) .[name].. 1 by mouth may repeat in 1 hour 5)  Vitamin D 69629 Unit Caps (Ergocalciferol) .[name].. 1 by mouth weekly for 8 weeks 6)  Lisinopril 10 Mg Tabs (Lisinopril) .[name].. 1 by mouth daily 7)  Flexeril 10 Mg Tabs (Cyclobenzaprine hcl) .[name].. 1 by mouth qhs 8)  Nitroglycerin 0.2 Mg/hr Pt24 (Nitroglycerin) .... Wear 1/4 patch 12 hours on, then 12 hours off 9)  Iron 325 (65 Fe) Mg Tabs (Ferrous sulfate) .[name].. 1 tab by mouth  two times a day for iron deficiency 10)  Colace 100 Mg Caps (Docusate sodium) .[name].. 1 tab by mouth bid 11)  Flexeril 10 Mg Tabs (Cyclobenzaprine hcl) .[name].. 1 tab by mouth qhs as needed spasm 12)  Mobic 15 Mg Tabs (Meloxicam) .[name].. 1 by mouth with food as needed back pain Prescriptions: FLEXERIL 10 MG TABS (CYCLOBENZAPRINE HCL) 1 by mouth qhs  #30 x 2   Entered and Authorized by:   Enid Baas MD   Signed by:   Enid Baas MD on 01/05/2010   Method used:   Electronically to        [facility]* (retail)       [address]       [address] Shipping/mailing       [place], [place]  [address]       Ph: [phone]       Fax: [phone]   RxID:   [id number] MOBIC 15 MG TABS (MELOXICAM) 1 by mouth with food as needed back pain  #30  x 2   Entered and Authorized by:   Enid Baas MD   Signed by:   Enid Baas MD on 01/05/2010   Method used:   Electronically to        [facility]* (retail)       [address]       [address] Shipping/mailing       [place], [place]  [address]       Ph: [phone]       Fax: [phone]   RxID:   [id number]

## 2010-09-21 NOTE — Miscellaneous (Signed)
Summary: Changing Prob List    Clinical Lists Changes  Problems: Removed problem of LOW BACK PAIN, ACUTE (ICD-724.2) Removed problem of NECK PAIN, ACUTE (ICD-723.1) Removed problem of DYSPNEA (ICD-786.05) Removed problem of SHOULDER PAIN, RIGHT (ICD-719.41) Removed problem of BACK PAIN (ICD-724.5) Removed problem of URINARY URGENCY (ZOX-096.04) Removed problem of HEADACHE (ICD-784.0) Removed problem of ELBOW PAIN NOS (ICD-719.42) Removed problem of SCREENING FOR MALIGNANT NEOPLASM OF THE CERVIX (ICD-V76.2) Removed problem of ENCOUNTER FOR LONG-TERM USE OF OTHER MEDICATIONS (ICD-V58.69) Removed problem of LUMBAR RADICULOPATHY, LEFT (ICD-724.4) Removed problem of HYPERGLYCEMIA (ICD-790.29) Removed problem of ARTHRITIS (ICD-716.90) Observations: Added new observation of PAST MED HX: Current Problems:  HTN GERD Migraine HA Multiple pain complaints: back, elbow, neck, etc  ARTHRITIS (ICD-716.90) VITAMIN D DEFICIENCY (ICD-268.9) OVARIAN FAILURE (ICD-256.39) OBESITY, NOS (ICD-278.00) CARPAL TUNNEL SYNDROME (ICD-354.0) ANEMIA, IRON DEFICIENCY, UNSPEC. (ICD-280.9)   (06/28/2010 21:09) Added new observation of PRIMARY MD: Cat Ta MD (06/28/2010 21:09)       Past History:  Past Medical History: Current Problems:  HTN GERD Migraine HA Multiple pain complaints: back, elbow, neck, etc  ARTHRITIS (ICD-716.90) VITAMIN D DEFICIENCY (ICD-268.9) OVARIAN FAILURE (ICD-256.39) OBESITY, NOS (ICD-278.00) CARPAL TUNNEL SYNDROME (ICD-354.0) ANEMIA, IRON DEFICIENCY, UNSPEC. (ICD-280.9)

## 2010-09-21 NOTE — Miscellaneous (Signed)
Summary: [facility]  [facility]   Imported By: Marily Memos 01/27/2010 11:06:06  _____________________________________________________________________  External Attachment:    Type:   Image     Comment:   External Document

## 2010-09-21 NOTE — Progress Notes (Signed)
Summary: triage   Phone Note Call from Patient Call back at Work Phone [phone]   Caller: Patient Summary of Call: Pt seen at urgent care for uti and started on cipro now thinks she has yeast infection. Initial call taken by: Clydell Hakim,  January 26, 2010 11:24 AM  Follow-up for Phone Call        went to [facility] saturday. was given antibiotics. now has yeast infection. wants [name] to call something in for that. told her she would have to be seen since we did not rx the med. she will call [facility] & see if they will call it in.  Follow-up by: Golden Circle RN,  January 26, 2010 11:31 AM

## 2010-09-21 NOTE — Assessment & Plan Note (Signed)
Summary: F/U [facility]   Vital Signs:  Patient profile:   55 year old female Height:      63 inches Pulse rate:   63 / minute BP sitting:   114 / 75  (right arm)  Vitals Entered By: Tessie Fass CMA (March 13, 2010 4:19 PM) CC: F/U back and leg   Primary Care Provider:  Cat Ta MD  CC:  F/U back and leg.  History of Present Illness: f/u low back pain--has completed PT and not much improvement. She thinks her pain in the very low back is actually aggravated by the PT. She is quite frustrated no new symptoms. Pain is still 4-5 / 10 esp after she stands or sits for a long time. No radiation into legs. No incontinence  Current Medications (verified): 1)  Protonix 40 Mg Tbec (Pantoprazole Sodium) .... Take 1 Tablet By Mouth Once A Day 2)  Imitrex 25 Mg  Tabs (Sumatriptan Succinate) .[name].. 1 By Mouth May Repeat in 1 Hour 3)  Vitamin D 16109 Unit  Caps (Ergocalciferol) .[name].. 1 By Mouth Weekly For 8 Weeks 4)  Lisinopril 10 Mg  Tabs (Lisinopril) .[name].. 1 By Mouth Daily 5)  Flexeril 10 Mg Tabs (Cyclobenzaprine Hcl) .[name].. 1 By Mouth Qhs 6)  Nitroglycerin 0.2 Mg/hr Pt24 (Nitroglycerin) .... Wear 1/4 Patch 12 Hours On, Then 12 Hours Off 7)  Iron 325 (65 Fe) Mg Tabs (Ferrous Sulfate) .[name].. 1 Tab By Mouth Two Times A Day For Iron Deficiency 8)  Colace 100 Mg Caps (Docusate Sodium) .[name].. 1 Tab By Mouth Bid 9)  Flexeril 10 Mg Tabs (Cyclobenzaprine Hcl) .[name].. 1 Tab By Mouth Qhs As Needed Spasm 10)  Mobic 15 Mg Tabs (Meloxicam) .[name].. 1 By Mouth With Food As Needed Back Pain  Allergies: No Known Drug Allergies  Review of Systems  The patient denies fever.         denies incontinence  Physical Exam  General:  overweight-appearing.   Msk:  TTO left SI joint + Pearlean Brownie on right. Poor extension of spine at hips secondary to extreme tightness of lower back muscles and poor core strength Neg SLR B  GAIT is normal   Impression & Recommendations:  Problem # 1:  LOW BACK PAIN, ACUTE  (ICD-724.2)  Orders: Joint Aspirate / Injection, Intermediate (60454) Kenalog 10 mg inj (U9811) we will try SI injection she had no improvement with PT--could consider starting HEP although possibility of that working if formaal PT did not is low she wonders about getting MRI and we can discuss fuurther at next ov  Complete Medication List: 1)  Protonix 40 Mg Tbec (Pantoprazole sodium) .... Take 1 tablet by mouth once a day 2)  Imitrex 25 Mg Tabs (Sumatriptan succinate) .[name].. 1 by mouth may repeat in 1 hour 3)  Vitamin D 91478 Unit Caps (Ergocalciferol) .[name].. 1 by mouth weekly for 8 weeks 4)  Lisinopril 10 Mg Tabs (Lisinopril) .[name].. 1 by mouth daily 5)  Flexeril 10 Mg Tabs (Cyclobenzaprine hcl) .[name].. 1 by mouth qhs 6)  Nitroglycerin 0.2 Mg/hr Pt24 (Nitroglycerin) .... Wear 1/4 patch 12 hours on, then 12 hours off 7)  Iron 325 (65 Fe) Mg Tabs (Ferrous sulfate) .[name].. 1 tab by mouth two times a day for iron deficiency 8)  Colace 100 Mg Caps (Docusate sodium) .[name].. 1 tab by mouth bid 9)  Flexeril 10 Mg Tabs (Cyclobenzaprine hcl) .[name].. 1 tab by mouth qhs as needed spasm 10)  Mobic 15 Mg Tabs (Meloxicam) .[name].. 1 by  mouth with food as needed back pain

## 2010-09-21 NOTE — Assessment & Plan Note (Signed)
Summary: [facility]/U [facility]   Vital Signs:  Patient profile:   55 year old female BP sitting:   116 / 76  Vitals Entered By: Lillia Pauls CMA (March 30, 2010 8:47 AM)  Primary Provider:  Angeline Slim MD   History of Present Illness: 55yo female to office for f/u hip/ SIJ pain.  s/p left SIJ injection with 75% improvement.  Starting to feel knot in left gluteal area w/ sensation of sitting on ball.  Some numbness/tingling radiating down back of left leg down to foot.  Sometimes feels taht leg is week.  Taking flexeril q hs & mobic with some improvement.  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  Allergies: No Known Drug Allergies  Physical Exam  General:  alert and overweight-appearing.   Pulses:  +2/4 lower ext b/l Neurologic:  Sensory change: normal lower ext sensation Reflex change: +2/4 patella & achilles b/l  Strength at foot - bilateral Plantar-flexion:  5/ 5    Dorsi-flexion:  5/ 5    Eversion:  5/ 5   Inversion:  5/ 5  Hip strength - globally weak right hip +4/5; left hip strength +5/5 throughout  Leg strength Quad:  5/ 5   Hamstring:  5/ 5     Gait: walks with slight limp favoring L lower ext. Normal toe walk, heel walk, tandem walk.   Detailed Back/Spine Exam  Lumbosacral Exam:  Inspection-deformity:    Normal Palpation-spinal tenderness:  Abnormal    (+)TTP L paraspinal muscles in area of L3-5.  Also tender over left SI joint.  No midline tenderness Range of Motion:    Forward Flexion:   80 degrees    Hyperextension:   15 degrees    Right Lateral Bend:   20 degrees    Left Lateral Bend:   20 degrees Lying Straight Leg Raise:    Right:  negative    Left:  positive at 60 degrees Sitting Straight Leg Raise:    Right:  negative    Left:  positive at 60 degrees Reverse Straight Leg Raise:    Right:  negative    Left:  negative Contralateral Straight Leg Raise:    Right:  negative    Left:  negative Sciatic Notch:    There is left sciatic  notch tenderness. Toe Walking:    Right:  normal    Left:  normal Heel Walking:    Right:  normal    Left:  normal Fabere Test:    Right:  negative    Left:  positive   Impression & Recommendations:  Problem # 1:  LOW BACK PAIN, ACUTE (ICD-724.2) - LBP with radicular symptoms, likely disc involvement - No red flags on today's examination.  - Reviewed various tx options at this time. - Pt not interested in surgery at this time, therefore defer MRI at this time unless symptoms change. - Start neurontin 300mg  qs & titrate to dose of 300mg  three times a day over 3-week period.  Counseled on side effects of medication. - Stop flexeril b/c may cause over sedation with neurontin. - Encouraged to walk regularly - 5-mins continuously at least 2-3 times daily - Should continue at least 2-3 back stretches daily. - Immediately seek medical attention for saddle anesthesia, incontinence, worsening symptoms, or any other concerns. - f/u 6 wks for re-evaluation, may return sooner if needed.   The following medications were removed from the medication list:    Flexeril 10 Mg Tabs (Cyclobenzaprine hcl) .Marland KitchenMarland KitchenMarland KitchenMarland Kitchen  1 tab by mouth qhs as needed spasm Her updated medication list for this problem includes:    Flexeril 10 Mg Tabs (Cyclobenzaprine hcl) .[name]... 1 by mouth qhs    Mobic 15 Mg Tabs (Meloxicam) .[name]... 1 by mouth with food as needed back pain  Problem # 2:  LUMBAR RADICULOPATHY, LEFT (ICD-724.4) - Sx's likely have discogenic component - see above   The following medications were removed from the medication list:    Flexeril 10 Mg Tabs (Cyclobenzaprine hcl) .[name]... 1 tab by mouth qhs as needed spasm Her updated medication list for this problem includes:    Flexeril 10 Mg Tabs (Cyclobenzaprine hcl) .[name]... 1 by mouth qhs    Mobic 15 Mg Tabs (Meloxicam) .[name]... 1 by mouth with food as needed back pain  Complete Medication List: 1)  Protonix 40 Mg Tbec (Pantoprazole sodium) .... Take 1 tablet by mouth  once a day 2)  Imitrex 25 Mg Tabs (Sumatriptan succinate) .[name].. 1 by mouth may repeat in 1 hour 3)  Vitamin D 16109 Unit Caps (Ergocalciferol) .[name].. 1 by mouth weekly for 8 weeks 4)  Lisinopril 10 Mg Tabs (Lisinopril) .[name].. 1 by mouth daily 5)  Flexeril 10 Mg Tabs (Cyclobenzaprine hcl) .[name].. 1 by mouth qhs 6)  Iron 325 (65 Fe) Mg Tabs (Ferrous sulfate) .[name].. 1 tab by mouth two times a day for iron deficiency 7)  Colace 100 Mg Caps (Docusate sodium) .[name].. 1 tab by mouth bid 8)  Mobic 15 Mg Tabs (Meloxicam) .[name].. 1 by mouth with food as needed back pain 9)  Gabapentin 300 Mg Caps (Gabapentin) .[name].. 1 by mouth tid  Patient Instructions: 1)  Use gabapentin at night for 1 week 2)  then two times a day for 1 week 3)  then three times a day until you see [name] 4)  rtc 6 weeks 5)  keep up at least 2 to 3 back stretches 6)  walk 5 mins at least 3 times per day Prescriptions: GABAPENTIN 300 MG CAPS (GABAPENTIN) 1 by mouth tid  #90 x 2   Entered by:   Lillia Pauls CMA   Authorized by:   Enid Baas MD   Signed by:   Lillia Pauls CMA on 03/30/2010   Method used:   Electronically to        [facility]* (retail)       [address]       [address] Shipping/mailing       [place], [place]  [address]       Ph: [phone]       Fax: [phone]   RxID:   [id number]

## 2010-09-22 NOTE — Letter (Signed)
Summary: [facility]  [facility]   Imported By: Marily Memos 01/05/2010 14:47:08  _____________________________________________________________________  External Attachment:    Type:   Image     Comment:   External Document

## 2010-11-06 LAB — POCT URINALYSIS DIP (DEVICE)
Bilirubin Urine: NEGATIVE
Ketones, ur: NEGATIVE mg/dL
Nitrite: NEGATIVE
Protein, ur: 100 mg/dL — AB
Specific Gravity, Urine: 1.025 (ref 1.005–1.030)
Urobilinogen, UA: 1 mg/dL (ref 0.0–1.0)

## 2010-11-22 LAB — POCT URINALYSIS DIP (DEVICE)
Glucose, UA: NEGATIVE mg/dL
Nitrite: NEGATIVE
Urobilinogen, UA: 0.2 mg/dL (ref 0.0–1.0)
pH: 5 (ref 5.0–8.0)

## 2010-11-22 LAB — URINE CULTURE

## 2010-11-22 LAB — POCT RAPID STREP A (OFFICE): Streptococcus, Group A Screen (Direct): NEGATIVE

## 2010-12-05 LAB — POCT HEMOGLOBIN-HEMACUE: Hemoglobin: 12.9 g/dL (ref 12.0–15.0)

## 2011-01-02 NOTE — Op Note (Signed)
Andrea Montes, Andrea Montes             ACCOUNT NO.:  [id number]   MEDICAL RECORD NO.:  [id number]          PATIENT TYPE:  AMB   LOCATION:  DSC                          FACILITY:  [facility]   PHYSICIAN:  Robert A. Thurston Hole, M.D. DATE OF BIRTH:  1956-07-18   DATE OF PROCEDURE:  10/04/2008  DATE OF DISCHARGE:                               OPERATIVE REPORT   PREOPERATIVE DIAGNOSES:  1. Right shoulder rotator cuff tear.  2. Right shoulder partial labrum tear.  3. Right shoulder impingement.  4. Right shoulder acromioclavicular joint degenerative joint disease      and spurring.   POSTOPERATIVE DIAGNOSES:  1. Right shoulder rotator cuff tear.  2. Right shoulder partial labrum tear.  3. Right shoulder impingement.  4. Right shoulder acromioclavicular joint degenerative joint disease      and spurring.   PROCEDURE:  1. Right shoulder examination under anesthesia followed by      arthroscopically assisted rotator cuff repair using Arthrex suture      anchor x1 plus PushLock anchor x1.  2. Right shoulder debridement of partial labrum tear.  3. Right shoulder subacromial decompression.  4. Right shoulder distal clavicle excision.   SURGEON:  Elana Alm. Thurston Hole, MD   ASSISTANT:  Julien Girt, PA   ANESTHESIA:  General.   OPERATIVE TIME:  1 hour.   COMPLICATIONS:  None.   INDICATION FOR PROCEDURE:  Andrea Montes is a 55 year old woman who has  had significant right shoulder pain for the past 3-4 months, increasing  in nature with exam and MRI documenting a rotator cuff tear, partial  labrum tear with impingement, and AC joint arthropathy.  She has failed  conservative care and is now to undergo arthroscopy and repair.   DESCRIPTION OF PROCEDURE:  Andrea Montes was brought to the operating  room on October 04, 2008, after an interscalene block was placed in the  holding room by Anesthesia.  She was placed on the operating table in  supine position.  After being placed under general  anesthesia, her right  shoulder was examined.  She had full range of motion of her shoulder  with stable ligamentous exam.  She received Ancef 2 g IV preoperatively  for prophylaxis.  She was then placed in a beach chair position, and her  shoulder and arm were prepped using sterile DuraPrep and draped using  sterile technique.  Originally, through a posterior arthroscopic portal,  the arthroscope with a pump attached was placed, and through an anterior  portal, an arthroscopic probe was placed.  On initial inspection, the  articular cartilage and the glenohumeral joint were intact.  She had  partial tearing in the anterior, superior, and posterior labrum 25%-30%,  which was debrided.  The anteroinferior labrum and anteroinferior  glenohumeral ligament complex were intact.  Biceps tendon anchor was  slightly hypermobile, which was debrided, but it was, otherwise, intact.  Biceps tendon was intact.  Rotator cuff showed a complete tear of the  supraspinatus and a partial tear of the infraspinatus, and this was  partially debrided arthroscopically.  The rest of the rotator cuff was  intact.  The  inferior capsular recess was free of pathology.  Subacromial space was entered, and a lateral arthroscopic portal was  made.  Moderately thickened bursitis was resected.  Impingement was  noted, and subacromial decompression was carried out removing 6-8 mm of  the undersurface of the anterior, anterolateral, and anteromedial  acromion, and CA ligament release carried out as well.  The [facility] joint  showed significant spurring and degenerative changes, and the distal 6-  mm clavicle was resected with a 6-mm bur.  After this was done, through  an accessory lateral portal, the rotator cuff tear was repaired  primarily with one Arthrex 5.5-mm suture anchor with the mattress suture  technique.  The 5.5 __________ anchor was deployed, and then each of the  sutures were passed arthroscopically and then tied down  with a firm and  tight repair.  This repair was further reinforced with one 4.5-mm  Arthrex PushLock anchor in the greater tuberosity.  This completely  repaired the rotator cuff tear tightly to the greater tuberosity.  The  shoulder could be brought through a full range of motion with no  impingement on the rotator cuff repair after this was done.  At this  point, it was felt that all pathology had been satisfactorily addressed.  The instruments were removed.  The portals were closed with 3-0 nylon  sutures.  Sterile dressings and abduction sling applied, and the patient  awakened and taken to the recovery room in stable condition.   FOLLOWUP CARE:  Andrea Montes to be followed as an outpatient on Percocet  and Robaxin with early physical therapy.  She will be back in our office  in a week for sutures out and followup.      Robert A. Thurston Hole, M.D.  Electronically Signed     RAW/MEDQ  D:  10/04/2008  T:  10/05/2008  Job:  [id number]

## 2011-01-02 NOTE — Assessment & Plan Note (Signed)
[facility]                         GASTROENTEROLOGY OFFICE NOTE   NAME:Andrea Montes, Andrea Montes                    MRN:          [id number]  DATE:02/25/2007                            DOB:          14-May-1956    PROBLEM:  Diarrhea.   Andrea Montes is a 55 year old African-American female here for  evaluation of diarrhea.  Approximately 2 days ago she developed severe  watery stools.  For 24 hours she had approximately 20 bowel movements.  Since that time her frequency has decreased.  She has had 3 bowel  movements over the past 12 hours.  Stools are loose and watery.  They  are without blood or mucus.  She had had mild abdominal discomfort,  myalgias and arthralgias.  She has had mild nausea but no vomiting.  She  is without frank fever.  Prior to this she had no objective complaints  except for a pyrosis, which is well-controlled with Protonix.   PAST MEDICAL HISTORY:  Pertinent for arthritis.  She is status post  hysterectomy and tubal ligation.   Family history is noncontributory.   MEDICATIONS:  Her only medication is hydrochlorothiazide.   She has no allergies.   She neither smokes nor drinks.  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   Review of systems is positive for feet swelling, joint pains, back pain  and leakage of urine.   She underwent colonoscopy in 2003 by Dr. Juanda Chance, which was normal.   PHYSICAL EXAMINATION:  VITAL SIGNS:  On exam, pulse 76, blood pressure  104/64, weight 258.  HEENT: EOMI. PERRLA. Sclerae are anicteric.  Conjunctivae are pink.  NECK:  Supple without thyromegaly, adenopathy or carotid bruits.  CHEST:  Clear to auscultation and percussion without adventitious  sounds.  CARDIAC:  Regular rhythm; normal S1 S2.  There are no murmurs, gallops  or rubs.  ABDOMEN:  She has very minimal diffuse tenderness without guarding or  rebound.  There are no abdominal masses or organomegaly.  EXTREMITIES:  Full range of motion.  No cyanosis, clubbing or edema.  RECTAL:  Deferred.   IMPRESSION:  Probable acute viral gastroenteritis.  Food poisoning is  less likely, though still possible.  At this point symptoms are  subsiding.   RECOMMENDATION:  No GI workup at this time.  Will continue her on the  current medications and advance her diet.     Barbette Hair. Arlyce Dice, MD,FACG  Electronically Signed    RDK/MedQ  DD: 02/25/2007  DT: 02/26/2007  Job #: [id number]   cc:   Deniece Portela A. Sheffield Slider, M.D.

## 2011-01-05 NOTE — Op Note (Signed)
NAMEFLORINE, Andrea Montes             ACCOUNT NO.:  [id number]   MEDICAL RECORD NO.:  [id number]          PATIENT TYPE:  AMB   LOCATION:  DSC                          FACILITY:  [facility]   PHYSICIAN:  Dionne Ano. Gramig III, M.D.DATE OF BIRTH:  [birth year]   DATE OF PROCEDURE:  DATE OF DISCHARGE:                                 OPERATIVE REPORT   PREOPERATIVE DIAGNOSIS:  Left carpal tunnel syndrome.   POSTOPERATIVE DIAGNOSIS:  Left carpal tunnel syndrome.   PROCEDURE:  1. Left median nerve/peripheral nerve block __________ level for      anesthetic purposes for a carpal tunnel release.  2. Left limited open carpal tunnel release.   SURGEON:  Dionne Ano. Amanda Pea, M.D.   ASSISTANT:  Karie Chimera, P.A.-C.   COMPLICATIONS:  None.   ANESTHESIA:  Peripheral nerve block with IV sedation.   TOURNIQUET TIME:  Less than 15 minutes.   ESTIMATED BLOOD LOSS:  Minimal.   DRAINS:  None.   INDICATIONS FOR THE PROCEDURE:  This patient is a pleasant 55 year old  female who presents with the above mentioned diagnosis.  I have counseled  him regarding the risks and benefits of surgery including risk of infection,  bleeding, anesthesia, damage to normal structures and failure of the surgery  to accomplish its intended goals of alleviating symptoms and restoring  function.  With this in mind, she desires to proceed.  All questions have  been encouraged and answered preoperatively.   OPERATIVE PROCEDURE:  The patient was seen by myself and anesthesia.  Taken  to the operative suite and underwent smooth induction of peripheral  nerve/median nerve block with lidocaine and Marcaine without epinephrine  placed about the wrist and median nerve region in the carpal canal.  This  provided a median nerve/peripheral nerve block and a supplemental field  block was placed as well.  Once this was done, she was prepped and draped a  second time with Betadine scrub and paint.  The sterile field was secured.  The  arm was elevated.  The tourniquet was insufflated to 250 mmHg.  An  incision was made at the distal edge of the transverse carpal ligament.  Dissection was carried down.  Retractor was placed.  Palmar fascia incised.  Following this the distal edge of the transverse carpal ligament was  released under 4.0 loupe magnification without difficulty.  Once this was  done, the fat pad aggressed nicely and distal to proximal dissection was  carried __________ available for canal preparatory device 1, 2 and 3, which  were placed directly midline without patient discomfort or problems.  I then  placed a security clip, the obturator disengaged and the security knife was  then placed in the security clip effectively releasing the proximal leaflet  of the transverse carpal ligament.  Following this I then irrigated the  canal copiously.  All looked quite well and I was pleased with the findings.  Once this was done I then deflated the tourniquet, obtained hemostasis,  irrigated once again and then closed the wound with interrupted Prolene.  Sterile dressing was applied.  The patient had excellent range  of motion to  the fingers and no pain or complications with the surgery.  All passes with  the canal preparatory device went quite smoothly without problems I should  note.  Once this was done, she was taken to the recovery room.  She will be  monitored and then released on Vicodin and Robaxin.  She will notify us if  any problems occur and return to the office to see [name] in seven days.   It has been a pleasure participating in her care.  We looked forward to  participating in her postoperative recovery.           ______________________________  Dionne Ano. Everlene Other, M.D.     Nash Mantis  D:  04/25/2006  T:  04/25/2006  Job:  [id number]

## 2011-01-05 NOTE — Procedures (Signed)
[facility]  Patient:    Andrea Montes, Andrea Montes Visit Number: [id number] MRN: [id number]          Service Type: END Location: ENDO Attending Physician:  Mervin Hack Dictated by:   Hedwig Morton. Juanda Chance, M.D. [facility] Admit Date:  09/30/2001   CC:         Theron Arista C. Eden Emms, M.D. [facility]   Procedure Report  PROCEDURE:  Colonoscopy.  INDICATIONS:  This 55 year old African-American female who works as a Systems developer for cardiology was found to be iron deficient.  She had total abdominal hysterectomy two years ago and notes that she was anemic at that time.  She denies seeing any bright red blood per rectum, hematemesis.  She does not take aspirin or NSAIDs.  There is no family history of colon cancer. She has been evaluated by cardiology for palpitations.  She is undergoing colonoscopy to evaluate her iron deficiency anemia.  INSTRUMENT:  Olympus single-channel videoscope.  PREMEDICATION:  Versed 8 mg IV, Demerol 100 mg IV.  DESCRIPTION OF PROCEDURE:  The Olympus single-channel videoscope was passed under direct vision through rectum to the sigmoid colon.  The patient was monitored by pulse oximetry.  Oxygen saturations were normal.  Her prep was good.  There were first-grade hemorrhoids in the rectum which were visualized by retroflexing the endoscope in the rectal ampulla.  Anal canal itself appeared somewhat erythematous, but there was no active bleeding.  The sigmoid colon had a redundant loop which was difficult to negotiate, but we were able to advance through using adjustable scope.  Descending colon, splenic flexure, and transverse colon were unremarkable.  Hepatic flexure and ascending colon were traversed without difficulty and showed normal-appearing mucosa.  Prep was excellent in the right colon all the way to the cecum.  Cecal pouch area and cecal valve were viewed fully and appeared normal.  Colonoscope was then retracted and colon decompressed.  The  patient tolerated the procedure well.  IMPRESSION: 1. Normal colonoscopy to the cecum. 2. Small internal hemorrhoids.  PLAN:  The patients anemia may be related to past blood loss prior to the hysterectomy that was never compensated, but she needs to be monitored for intermittent GI blood loss.  We will give her Hemoccult cards and restart her on iron supplements which were discontinued one week prior to colonoscopy. The patient was asked to have her blood count rechecked in four weeks and to follow up with Dr. Eden Emms.  If she continues to lose blood, then a small-bowel follow-through and upper endoscopy may be necessary for further evaluation. Dictated by:   Hedwig Morton. Juanda Chance, M.D. [facility] Attending Physician:  Mervin Hack DD:  09/30/01 TD:  09/30/01 Job: [id number]

## 2011-05-25 LAB — POCT URINALYSIS DIP (DEVICE)
Bilirubin Urine: NEGATIVE
Nitrite: POSITIVE — AB
Protein, ur: 100 mg/dL — AB
Urobilinogen, UA: 1 mg/dL (ref 0.0–1.0)
pH: 6 (ref 5.0–8.0)

## 2011-06-05 ENCOUNTER — Other Ambulatory Visit: Payer: Self-pay | Admitting: *Deleted

## 2011-06-05 ENCOUNTER — Other Ambulatory Visit: Payer: Self-pay | Admitting: Cardiovascular Disease

## 2011-06-05 ENCOUNTER — Other Ambulatory Visit: Payer: 59 | Admitting: *Deleted

## 2011-06-05 DIAGNOSIS — N390 Urinary tract infection, site not specified: Secondary | ICD-10-CM

## 2011-06-05 LAB — URINALYSIS, ROUTINE W REFLEX MICROSCOPIC
Bilirubin Urine: NEGATIVE
Ketones, ur: NEGATIVE
Specific Gravity, Urine: 1.02 (ref 1.000–1.030)
Total Protein, Urine: NEGATIVE
Urine Glucose: NEGATIVE
pH: 7 (ref 5.0–8.0)

## 2011-06-08 LAB — URINE CULTURE: Colony Count: >=100000

## 2011-07-05 ENCOUNTER — Other Ambulatory Visit: Payer: Self-pay | Admitting: Obstetrics & Gynecology

## 2011-07-05 DIAGNOSIS — Z1231 Encounter for screening mammogram for malignant neoplasm of breast: Secondary | ICD-10-CM

## 2011-07-06 ENCOUNTER — Ambulatory Visit (HOSPITAL_COMMUNITY)
Admission: RE | Admit: 2011-07-06 | Discharge: 2011-07-06 | Disposition: A | Discharge status: Home / Self Care | Payer: 59 | Source: Ambulatory Visit | Attending: Obstetrics & Gynecology | Admitting: Obstetrics & Gynecology

## 2011-07-06 DIAGNOSIS — Z1231 Encounter for screening mammogram for malignant neoplasm of breast: Secondary | ICD-10-CM | POA: Insufficient documentation

## 2011-09-25 ENCOUNTER — Ambulatory Visit (AMBULATORY_SURGERY_CENTER): Payer: 59 | Admitting: *Deleted

## 2011-09-25 VITALS — Ht 63.0 in | Wt 265.6 lb

## 2011-09-25 DIAGNOSIS — Z1211 Encounter for screening for malignant neoplasm of colon: Secondary | ICD-10-CM

## 2011-09-25 MED ORDER — MOVIPREP 100 G PO SOLR: ORAL | Status: DC

## 2011-10-09 ENCOUNTER — Ambulatory Visit (AMBULATORY_SURGERY_CENTER): Payer: 59 | Admitting: Internal Medicine

## 2011-10-09 ENCOUNTER — Encounter: Payer: Self-pay | Admitting: Internal Medicine

## 2011-10-09 VITALS — BP 132/103 | HR 60 | Temp 97.4°F | Resp 24 | Ht 63.0 in | Wt 265.0 lb

## 2011-10-09 DIAGNOSIS — D126 Benign neoplasm of colon, unspecified: Secondary | ICD-10-CM

## 2011-10-09 DIAGNOSIS — Z1211 Encounter for screening for malignant neoplasm of colon: Secondary | ICD-10-CM

## 2011-10-09 HISTORY — PX: COLONOSCOPY WITH PROPOFOL: SHX5780

## 2011-10-09 MED ORDER — SODIUM CHLORIDE 0.9 % IV SOLN: 500.0000 mL | INTRAVENOUS | Status: DC

## 2011-10-09 NOTE — Patient Instructions (Addendum)

## 2011-10-09 NOTE — Progress Notes (Signed)
Patient did not experience any of the following events: a burn prior to discharge; a fall within the facility; wrong site/side/patient/procedure/implant event; or a hospital transfer or hospital admission upon discharge from the facility. (G8907) Patient did not have preoperative order for IV antibiotic SSI prophylaxis. (G8918)  

## 2011-10-09 NOTE — Op Note (Signed)
[facility] [address] [place], [place]  [address]  COLONOSCOPY PROCEDURE REPORT  PATIENT:  Andrea Montes, Andrea Montes  MR#:  [id number] BIRTHDATE:  1956-04-12, 55 yrs. old  GENDER:  female ENDOSCOPIST:  Hedwig Morton. Juanda Chance, MD REF. BY:  Demetria Pore, M.D. PROCEDURE DATE:  10/09/2011 PROCEDURE:  Colonoscopy with biopsy ASA CLASS:  Class II INDICATIONS:  colorectal cancer screening, average risk last colon 09/2001 was normal MEDICATIONS:   MAC sedation, administered by CRNA, propofol (Diprivan) 250 mg  DESCRIPTION OF PROCEDURE:   After the risks and benefits and of the procedure were explained, informed consent was obtained. Digital rectal exam was performed and revealed no rectal masses. The LB 180AL [id number] endoscope was introduced through the anus and advanced to the cecum, which was identified by both the appendix and ileocecal valve.  The quality of the prep was good, using MoviPrep.  The instrument was then slowly withdrawn as the colon was fully examined. <<PROCEDUREIMAGES>>  FINDINGS:  Two polyps were found. 2 diminutive polyps at 20 cm The polyps were removed using cold biopsy forceps (see image4).  This was otherwise a normal examination of the colon (see image5, image3, image2, and image1).   Retroflexed views in the rectum revealed no abnormalities.    The scope was then withdrawn from the patient and the procedure completed.  COMPLICATIONS:  None ENDOSCOPIC IMPRESSION: 1) Two polyps 2) Otherwise normal examination RECOMMENDATIONS: 1) Await pathology results 2) High fiber diet.  REPEAT EXAM:  In 10 year(s) for.  ______________________________ Hedwig Morton. Juanda Chance, MD  CC:  n. eSIGNED:   Hedwig Morton. Talya Quain at 10/09/2011 09:10 AM  Omar Person, [phone]

## 2011-10-10 ENCOUNTER — Telehealth: Payer: Self-pay

## 2011-10-10 NOTE — Telephone Encounter (Signed)
  Follow up Call-  Call back number 10/09/2011  Post procedure Call Back phone  # [phone]  Permission to leave phone message Yes     Patient questions:  Do you have a fever, pain , or abdominal swelling? no Pain Score  0 *  Have you tolerated food without any problems? yes  Have you been able to return to your normal activities? yes  Do you have any questions about your discharge instructions: Diet   no Medications  no Follow up visit  no  Do you have questions or concerns about your Care? no  Actions: * If pain score is 4 or above: No action needed, pain <4.

## 2011-10-15 ENCOUNTER — Encounter: Payer: Self-pay | Admitting: Internal Medicine

## 2011-10-24 ENCOUNTER — Encounter (HOSPITAL_BASED_OUTPATIENT_CLINIC_OR_DEPARTMENT_OTHER): Payer: 59

## 2011-11-15 ENCOUNTER — Ambulatory Visit (HOSPITAL_BASED_OUTPATIENT_CLINIC_OR_DEPARTMENT_OTHER): Payer: 59 | Attending: Internal Medicine | Admitting: Radiology

## 2011-11-15 VITALS — Ht 63.0 in | Wt 261.0 lb

## 2011-11-15 DIAGNOSIS — G4733 Obstructive sleep apnea (adult) (pediatric): Secondary | ICD-10-CM | POA: Insufficient documentation

## 2011-11-15 DIAGNOSIS — Z6841 Body Mass Index (BMI) 40.0 and over, adult: Secondary | ICD-10-CM | POA: Insufficient documentation

## 2011-11-15 DIAGNOSIS — E669 Obesity, unspecified: Secondary | ICD-10-CM

## 2011-11-15 DIAGNOSIS — G4730 Sleep apnea, unspecified: Secondary | ICD-10-CM

## 2011-11-16 DIAGNOSIS — R0609 Other forms of dyspnea: Secondary | ICD-10-CM

## 2011-11-16 DIAGNOSIS — R0989 Other specified symptoms and signs involving the circulatory and respiratory systems: Secondary | ICD-10-CM

## 2011-11-16 DIAGNOSIS — M62838 Other muscle spasm: Secondary | ICD-10-CM

## 2011-11-16 DIAGNOSIS — G4733 Obstructive sleep apnea (adult) (pediatric): Secondary | ICD-10-CM

## 2011-11-17 NOTE — Procedures (Signed)
Andrea Montes, Andrea Montes             ACCOUNT NO.:  [id number]  MEDICAL RECORD NO.:  [id number]          PATIENT TYPE:  OUT  LOCATION:  SLEEP CENTER                 FACILITY[facility]  [facility]  PHYSICIAN:  Kaycie Pegues D. Maple Hudson, MD, FCCP, FACPDATE OF BIRTH:  [birth year] 09, 1956  DATE OF STUDY:  11/15/2011                           NOCTURNAL POLYSOMNOGRAM  REFERRING PHYSICIAN:  Deirdre Peer. Polite, M.D.  REFERRING PHYSICIAN:  Deirdre Peer. Polite, M.D.  INDICATION FOR STUDY:  Hypersomnia with sleep apnea.  EPWORTH SLEEPINESS SCORE:  10/24.  BMI 46.2, weight 261 pounds, height 63 inches, neck 17 inches.  HOME MEDICATIONS:  Charted and reviewed.  SLEEP ARCHITECTURE:  Split study protocol.  During the diagnostic phase, total sleep time 205.5 minutes with sleep efficiency 86.2%.  Stage I was 3.9%, stage II 89.3%, stage III absent, REM 6.8% of total sleep time. Sleep latency 11.5 minutes, REM latency 117.5 minutes, awake after sleep onset 8 minutes.  Arousal index 17.2.  Bedtime medication:  None.  RESPIRATORY DATA:  Split study protocol.  Apnea/hypopnea index (AHI) 18.4 per hour.  A total of 63 events was scored including 51 obstructive apneas and 12 hypopneas.  Events were more common while sleeping supine. REM AHI 60 per hour.  CPAP was titrated to 14 CWP, AHI 0 per hour.  She wore a small ResMed Quattro FX fullface mask with heated humidifier and a C-Flex setting of 3.  OXYGEN DATA:  Very loud snoring before CPAP with oxygen desaturation to a nadir of 83% on room air.  With CPAP titration, snoring was prevented and mean oxygen saturation held 96.4% on room air.  CARDIAC DATA:  Sinus rhythm with occasional PAC.  MOVEMENT/PARASOMNIA:  Occasional limb jerk with no effect on sleep pattern.  Bathroom x1.  IMPRESSION/RECOMMENDATION: 1. Moderate obstructive sleep apnea/hypopnea syndrome, AHI 18.4 per     hour with very loud snoring and oxygen desaturation to a nadir of     83% on room air. 2. Successful CPAP  titration to 14 CWP, AHI 0 per hour.  She wore a     small ResMed Quattro FX fullface mask     with heated humidifier and a C-Flex setting of 3.  Snoring was     prevented and mean oxygen saturation held 96.4% on room air.     Zeniyah Peaster D. Maple Hudson, MD, [name], FACP Diplomate, American Board of Sleep Medicine    CDY/MEDQ  D:  11/16/2011 11:09:11  T:  11/17/2011 00:34:24  Job:  [id number]

## 2011-11-20 ENCOUNTER — Other Ambulatory Visit: Payer: Self-pay | Admitting: Cardiovascular Disease

## 2012-02-08 ENCOUNTER — Other Ambulatory Visit: Payer: Self-pay | Admitting: *Deleted

## 2012-02-08 ENCOUNTER — Other Ambulatory Visit (INDEPENDENT_AMBULATORY_CARE_PROVIDER_SITE_OTHER): Payer: 59

## 2012-02-08 DIAGNOSIS — R0989 Other specified symptoms and signs involving the circulatory and respiratory systems: Secondary | ICD-10-CM

## 2012-02-08 DIAGNOSIS — R350 Frequency of micturition: Secondary | ICD-10-CM

## 2012-02-08 LAB — URINALYSIS, ROUTINE W REFLEX MICROSCOPIC
Hgb urine dipstick: NEGATIVE
Specific Gravity, Urine: 1.025 (ref 1.000–1.030)
Urine Glucose: NEGATIVE
Urobilinogen, UA: 0.2 (ref 0.0–1.0)

## 2012-03-27 ENCOUNTER — Ambulatory Visit (INDEPENDENT_AMBULATORY_CARE_PROVIDER_SITE_OTHER): Payer: 59 | Admitting: Sports Medicine

## 2012-03-27 VITALS — BP 139/91 | Ht 63.0 in | Wt 260.0 lb

## 2012-03-27 DIAGNOSIS — M549 Dorsalgia, unspecified: Secondary | ICD-10-CM

## 2012-03-27 MED ORDER — MELOXICAM 15 MG PO TABS: ORAL_TABLET | ORAL | Status: DC
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
## 2012-04-10 ENCOUNTER — Ambulatory Visit: Payer: 59 | Admitting: Sports Medicine

## 2012-05-01 ENCOUNTER — Telehealth: Payer: Self-pay | Admitting: *Deleted

## 2012-05-01 NOTE — Telephone Encounter (Signed)
Pt states she is having lower body numbness since this morning.  Denies any trouble going to the bathroom or any foot drop.  States she feels like her lower back is going to "seize up".  She cannot take the muscle relaxer she has at work, and mobic is not helping.  Per Dr. Darrick Penna- advised pt to go home and lay down with feet up, put heat on back and take muscle relaxer.  Advised will have dr. Margaretha Sheffield call to discuss any further changes.

## 2012-05-02 ENCOUNTER — Telehealth: Payer: Self-pay | Admitting: Sports Medicine

## 2012-05-02 NOTE — Telephone Encounter (Signed)
Pt states she is feeling much better today.  Back is still sore, but she is able to walk much better.  Per Dr. Margaretha Sheffield scheduled her for a f/u 05/06/12 at 945am.

## 2012-05-02 NOTE — Telephone Encounter (Signed)
No encounter

## 2012-05-02 NOTE — Telephone Encounter (Signed)
Patient needs a f/u appt with me next week to discuss ongoing low back pain

## 2012-05-06 ENCOUNTER — Ambulatory Visit (INDEPENDENT_AMBULATORY_CARE_PROVIDER_SITE_OTHER): Payer: 59 | Admitting: Sports Medicine

## 2012-05-06 VITALS — BP 110/76 | Ht 63.0 in | Wt 260.0 lb

## 2012-05-06 DIAGNOSIS — M545 Low back pain, unspecified: Secondary | ICD-10-CM

## 2012-05-06 DIAGNOSIS — M2170 Unequal limb length (acquired), unspecified site: Secondary | ICD-10-CM

## 2012-05-06 DIAGNOSIS — M79605 Pain in left leg: Secondary | ICD-10-CM | POA: Insufficient documentation

## 2012-05-06 MED ORDER — DICLOFENAC SODIUM 75 MG PO TBEC: 75.0000 mg | DELAYED_RELEASE_TABLET | Freq: Two times a day (BID) | ORAL | Status: DC

## 2012-05-06 MED ORDER — METHYLPREDNISOLONE ACETATE 80 MG/ML IJ SUSP
80.0000 mg | Freq: Once | INTRAMUSCULAR | Status: AC
  Administered 2012-05-06: 80 mg via INTRAMUSCULAR

## 2012-05-06 MED ORDER — DIAZEPAM 10 MG PO TABS: ORAL_TABLET | ORAL | Status: DC

## 2012-05-06 MED ORDER — METHYLPREDNISOLONE ACETATE 80 MG/ML IJ SUSP: 80.0000 mg | Freq: Once | INTRAMUSCULAR | Status: DC

## 2012-05-06 NOTE — Assessment & Plan Note (Signed)
She is a marked leg length discrepancy on exam.  Approximately 2 cm.  Will correct it with a 5/16 heel lift at this time.  This is likely functional length discrepancy as well as the structural.  She does have significant genu valgus that may be contributing to her anatomic dysfunction.

## 2012-05-06 NOTE — Progress Notes (Signed)
  [facility]  Patient name: Andrea Montes MRN [id number]  Date of birth: 1956-01-08  CC & HPI:  Andrea Montes is a 56 y.o. female presenting today for followup of acute on chronic low back pain.  She reports after being seen last month she does have some improvement in her pain however had an acute decompensation on 9-12.  Since that time she said worsening bilateral lower back pain.  Worsening right gluteal pain.  She does report pain radiating down her legs behind her thigh bilaterally.  When this initially occurred she did have problems walking and had severe pain that has not significantly improved since that time.  She does report difficulty with ambulating and being able to perform activities of daily living due to the pain.    She has scheduled start physical therapy next Tuesday.  She has not been doing strengthening or flexibility activities.   ROS:  No change in bowel or bladder.  No burning or  tingling in her feet.    Pertinent History Reviewed:  Medical & Surgical Hx:  Reviewed: Significant for hypertension, obesity, chronic migraine, iron deficiency anemia, carpal tunnel syndrome  Medications: Reviewed & Updated - see associated section Social History: Reviewed - Significant for nonsmoker   Objective Findings:  Vitals:  Filed Vitals:   05/06/12 0933  BP: 110/76    PE: GENERAL:  Adult obese African American female. In no discomfort; no respiratory distress. Back exam: Positive Bilateral straight leg raise,   Lower extremity DTRs diminished bilaterally to 1/4.   No focal weakness, no focal sensory deficit to light touch.  negative FABER.   Assessment & Plan:

## 2012-05-06 NOTE — Assessment & Plan Note (Addendum)
Acute on chronic low back pain.  This is acutely worsened starting on 9-12.  We are going to obtain imaging of her lumbar spine including plain film x-rays and MRI.  We'll provide her with 80 mg of Depo-Medrol for pain relief.  We will change her anti-inflammatory to diclofenac as she does not feel the Mobic is helping her.  She is having some worsening of her headache and adverse reactions to the muscle relaxer.  We will try half a tablet to see if this has any effect.  She is to follow up with physical therapy starting next Tuesday.    We will call her with results of her imaging studies.  I do to feel the physical therapy will be the foundation for treatment as well as weight loss. Delineate further treatment based on MRI findings.

## 2012-05-06 NOTE — Patient Instructions (Addendum)
It was nice to see you today.  We giving an injection for your back pain that should help of the next coming days.  Like to obtain further imaging including x-ray and MRI of her lumbar spine.    Please make sure to make your physical therapy appointments.  Although this may be slightly uncomfortable for you initially it will ultimately provide you significant pain relief.    You may continue using ice or heat.  We'll change your anti-inflammatory to Voltaren (Diclofenac) to be taken twice daily.  You can also try taking half of the muscle relaxer as needed.  This may help with the hang-over symptoms you are having

## 2012-05-08 ENCOUNTER — Other Ambulatory Visit (HOSPITAL_COMMUNITY): Payer: 59

## 2012-05-08 ENCOUNTER — Ambulatory Visit (HOSPITAL_COMMUNITY)
Admission: RE | Admit: 2012-05-08 | Discharge: 2012-05-08 | Disposition: A | Discharge status: Home / Self Care | Payer: 59 | Source: Ambulatory Visit | Attending: Sports Medicine | Admitting: Sports Medicine

## 2012-05-08 DIAGNOSIS — M79605 Pain in left leg: Secondary | ICD-10-CM

## 2012-05-08 DIAGNOSIS — M129 Arthropathy, unspecified: Secondary | ICD-10-CM | POA: Insufficient documentation

## 2012-05-08 DIAGNOSIS — M545 Low back pain, unspecified: Secondary | ICD-10-CM | POA: Insufficient documentation

## 2012-05-09 ENCOUNTER — Telehealth: Payer: Self-pay | Admitting: Sports Medicine

## 2012-05-09 NOTE — Telephone Encounter (Signed)
I spoke with the patient today on the phone regarding x-rays and MRI findings of her lumbar spine. Dominant finding is facet hypertrophy. No significant spinal or foraminal stenosis. Based on her level of pain I recommended referral to Dr. Maurice Small. She may benefit from a trial of facet injections. I want her to continue with physical therapy as well. At this point in time I would defer further treatment to the discretion of Dr. Maurice Small.

## 2012-05-12 ENCOUNTER — Telehealth: Payer: Self-pay | Admitting: *Deleted

## 2012-05-12 NOTE — Telephone Encounter (Signed)
Message copied by Mora Bellman on Mon May 12, 2012 11:07 AM ------      Message from: Ralene Cork      Created: Fri May 09, 2012 10:52 AM      Regarding: Maurice Small referal       Please refer to Dr.Ibazebo for evaluation and treatment.      ----- Message -----         From: Rad Results In Interface         Sent: 05/09/2012   8:43 AM           To: Ralene Cork, DO

## 2012-05-12 NOTE — Telephone Encounter (Signed)
Called [facility] to set pt up with Dr. Maurice Small.  Pt will need to contact billing before she can be scheduled for an appt.  She will be scheduled when she calls to handle her balance.  Pt notified, and agreeable to call [facility].  Asked her to call back and let me know when appt is.

## 2012-05-13 ENCOUNTER — Ambulatory Visit: Payer: 59 | Attending: Sports Medicine

## 2012-05-13 DIAGNOSIS — M2550 Pain in unspecified joint: Secondary | ICD-10-CM | POA: Insufficient documentation

## 2012-05-13 DIAGNOSIS — R293 Abnormal posture: Secondary | ICD-10-CM | POA: Insufficient documentation

## 2012-05-13 DIAGNOSIS — M2560 Stiffness of unspecified joint, not elsewhere classified: Secondary | ICD-10-CM | POA: Insufficient documentation

## 2012-05-13 DIAGNOSIS — IMO0001 Reserved for inherently not codable concepts without codable children: Secondary | ICD-10-CM | POA: Insufficient documentation

## 2012-05-13 DIAGNOSIS — R5381 Other malaise: Secondary | ICD-10-CM | POA: Insufficient documentation

## 2012-05-15 ENCOUNTER — Ambulatory Visit: Payer: 59 | Admitting: Rehabilitation

## 2012-05-19 ENCOUNTER — Encounter: Payer: 59 | Admitting: Rehabilitative and Restorative Service Providers"

## 2012-05-21 ENCOUNTER — Ambulatory Visit: Payer: 59 | Attending: Sports Medicine | Admitting: Rehabilitative and Restorative Service Providers"

## 2012-05-21 DIAGNOSIS — IMO0001 Reserved for inherently not codable concepts without codable children: Secondary | ICD-10-CM | POA: Insufficient documentation

## 2012-05-21 DIAGNOSIS — R5381 Other malaise: Secondary | ICD-10-CM | POA: Insufficient documentation

## 2012-05-21 DIAGNOSIS — M2550 Pain in unspecified joint: Secondary | ICD-10-CM | POA: Insufficient documentation

## 2012-05-21 DIAGNOSIS — R293 Abnormal posture: Secondary | ICD-10-CM | POA: Insufficient documentation

## 2012-05-21 DIAGNOSIS — M2560 Stiffness of unspecified joint, not elsewhere classified: Secondary | ICD-10-CM | POA: Insufficient documentation

## 2012-05-26 ENCOUNTER — Ambulatory Visit: Payer: 59 | Admitting: Rehabilitative and Restorative Service Providers"

## 2012-05-28 ENCOUNTER — Ambulatory Visit: Payer: 59 | Admitting: Rehabilitative and Restorative Service Providers"

## 2012-06-02 ENCOUNTER — Telehealth: Payer: Self-pay | Admitting: *Deleted

## 2012-06-02 NOTE — Telephone Encounter (Signed)
Referral sent to Dr. Verlee Rossetti office.  See media tab for referral details.

## 2012-06-02 NOTE — Telephone Encounter (Signed)
Message copied by Mora Bellman on Mon Jun 02, 2012  4:08 PM ------      Message from: Lizbeth Bark      Created: Fri May 30, 2012 10:33 AM      Regarding: phone message      Contact: [phone]       Pt would like you to call her regarding the referral.      Would like to be referred Dr. Danielle Dess Due to having  bad debt at [employer].

## 2012-06-03 ENCOUNTER — Ambulatory Visit: Payer: 59 | Admitting: Rehabilitation

## 2012-06-05 ENCOUNTER — Ambulatory Visit: Payer: 59 | Admitting: Rehabilitative and Restorative Service Providers"

## 2012-06-09 ENCOUNTER — Ambulatory Visit: Payer: 59 | Admitting: Rehabilitative and Restorative Service Providers"

## 2012-06-12 ENCOUNTER — Ambulatory Visit: Payer: 59 | Admitting: Rehabilitative and Restorative Service Providers"

## 2012-06-16 ENCOUNTER — Encounter: Payer: 59 | Admitting: Rehabilitative and Restorative Service Providers"

## 2012-06-18 ENCOUNTER — Ambulatory Visit: Payer: 59 | Admitting: Rehabilitative and Restorative Service Providers"

## 2012-08-02 ENCOUNTER — Emergency Department (HOSPITAL_COMMUNITY)
Admission: EM | Admit: 2012-08-02 | Discharge: 2012-08-02 | Disposition: A | Discharge status: Home / Self Care | Payer: 59 | Source: Home / Self Care | Attending: Emergency Medicine | Admitting: Emergency Medicine

## 2012-08-02 ENCOUNTER — Encounter (HOSPITAL_COMMUNITY): Payer: Self-pay | Admitting: *Deleted

## 2012-08-02 DIAGNOSIS — J0100 Acute maxillary sinusitis, unspecified: Secondary | ICD-10-CM

## 2012-08-02 DIAGNOSIS — B029 Zoster without complications: Secondary | ICD-10-CM

## 2012-08-02 MED ORDER — ACYCLOVIR 400 MG PO TABS: 800.0000 mg | ORAL_TABLET | Freq: Three times a day (TID) | ORAL | Status: AC

## 2012-08-02 MED ORDER — FEXOFENADINE HCL 60 MG PO TABS: 60.0000 mg | ORAL_TABLET | Freq: Two times a day (BID) | ORAL | Status: DC

## 2012-08-02 MED ORDER — AMOXICILLIN-POT CLAVULANATE 500-125 MG PO TABS: 1.0000 | ORAL_TABLET | Freq: Two times a day (BID) | ORAL | Status: AC

## 2012-08-02 NOTE — ED Notes (Signed)
Pt reports facial pressure and sneezing that she believes is related to a possible sinus infection and 3 small red areas on back that are painful to touch - unrelieved with warm compresses

## 2012-08-02 NOTE — ED Provider Notes (Signed)
History     CSN: [id number]  Arrival date & time 08/02/12  1116   First MD Initiated Contact with Patient 08/02/12 1147      Chief Complaint  Patient presents with  . Facial Pain  . Abscess    (Consider location/radiation/quality/duration/timing/severity/associated sxs/prior treatment) HPI Comments: Patient presents urgent care this morning complaining of an ongoing sinus congestion, pressure and postnasal dripping and a partially instructed right nostril for about 7 days. She is blowing off-colored mucosa and is also having postnasal drainage. (Patient points to right maxillary sinus region), some in term attempt sensation that her ears pop. She does describe that last week she had a minor cold seemed to have improved. She also is present she's been under a lot of stress taking care of her grandson and work problems etc. She started developing a rash on the right side of her mid back it is extremely sensitive and painful she has several little bumps that are painful to touch but does describe pain and discomfort that radiates toward the right lateral side of her rib cage." She is wondering if this could be shingles".   Patient is a 56 y.o. female presenting with abscess. The history is provided by the patient.  Abscess  This is a new problem. The problem occurs rarely. The abscess is present on the back. The abscess is characterized by redness. Associated symptoms include congestion, rhinorrhea and sore throat. Pertinent negatives include no fever.    Past Medical History  Diagnosis Date  . GERD (gastroesophageal reflux disease)   . Hypertension   . Blood transfusion   . Anemia   . History of blood transfusion   . Arthritis     Past Surgical History  Procedure Date  . Rotator cuff repair 2010  . Carpal tunnel repair 2008  . Colonoscopy   . Vaginal hysterectomy 2001    Family History  Problem Relation Age of Onset  . Kidney disease Brother   . Esophageal cancer Maternal  Uncle   . Colon cancer Neg Hx   . Rectal cancer Neg Hx   . Stomach cancer Neg Hx     History  Substance Use Topics  . Smoking status: Never Smoker   . Smokeless tobacco: Never Used  . Alcohol Use: No    OB History    Grav Para Term Preterm Abortions TAB SAB Ect Mult Living                  Review of Systems  Constitutional: Positive for activity change and appetite change. Negative for fever, chills, diaphoresis and fatigue.  HENT: Positive for congestion, sore throat, rhinorrhea, postnasal drip and sinus pressure. Negative for drooling, trouble swallowing, neck pain, neck stiffness and dental problem.   Respiratory: Negative for shortness of breath and wheezing.   Skin: Positive for rash.  Psychiatric/Behavioral: The patient is not nervous/anxious.     Allergies  Review of patient's allergies indicates no known allergies.  Home Medications   Current Outpatient Rx  Name  Route  Sig  Dispense  Refill  . DOCUSATE SODIUM 100 MG PO CAPS   Oral   Take 100 mg by mouth 2 (two) times daily.           . ERGOCALCIFEROL 50000 UNITS PO CAPS   Oral   Take 50,000 Units by mouth once a week. For 8 weeks          . IRON 325 (65 FE) MG PO TABS   Oral  Take 1 tablet by mouth 2 (two) times daily. For iron deficiency          . FUROSEMIDE 20 MG PO TABS      TAKE 1 TABLET BY MOUTH DAILY   30 tablet   12   . GABAPENTIN 300 MG PO CAPS   Oral   Take 300 mg by mouth 3 (three) times daily.           [name] LISINOPRIL 10 MG PO TABS   Oral   Take 10 mg by mouth daily.           [name] PANTOPRAZOLE SODIUM 40 MG PO TBEC   Oral   Take 40 mg by mouth daily.           . ACYCLOVIR 400 MG PO TABS   Oral   Take 2 tablets (800 mg total) by mouth 3 (three) times daily.   28 tablet   0   . AMOXICILLIN-POT CLAVULANATE 500-125 MG PO TABS   Oral   Take 1 tablet (500 mg total) by mouth 2 (two) times daily.   20 tablet   0   . DIAZEPAM 10 MG PO TABS      Take one hour before  MRI   1 tablet   0   . DICLOFENAC SODIUM 75 MG PO TBEC   Oral   Take 1 tablet (75 mg total) by mouth 2 (two) times daily.   60 tablet   3   . FEXOFENADINE HCL 60 MG PO TABS   Oral   Take 1 tablet (60 mg total) by mouth 2 (two) times daily.   20 tablet   0   . MELOXICAM 15 MG PO TABS      Take 1 tablet by mouth daily for 2 weeks, then as needed.   30 tablet   1   . SUMATRIPTAN SUCCINATE 25 MG PO TABS      1 tab by mouth. May repeat in 1 hour            BP 142/94  Pulse 76  Temp 97.6 F (36.4 C) (Oral)  Resp 16  SpO2 96%  Physical Exam  Vitals reviewed. Constitutional: Vital signs are normal. She appears well-developed and well-nourished.  Non-toxic appearance. She does not have a sickly appearance. She does not appear ill.  HENT:  Head:    Mouth/Throat: Uvula is midline and mucous membranes are normal. Posterior oropharyngeal erythema present. No oropharyngeal exudate or tonsillar abscesses.  Pulmonary/Chest: Effort normal and breath sounds normal. No respiratory distress. She has no decreased breath sounds. She has no wheezes. She has no rhonchi. She has no rales. She exhibits no tenderness.  Neurological: She is alert.  Skin: Rash noted. There is erythema.       ED Course  Procedures (including critical care time)  Labs Reviewed - No data to display No results found.   1. Sinusitis, acute, maxillary   2. Shingles       MDM   Right maxillary sinusitis. With coexistent shingles of the thoracic region ( posteriorly), patient has been prescribed a course of Augmentin, Allegra and a sickle tear.       Jimmie Molly, MD 08/02/12 [phone]

## 2012-09-17 ENCOUNTER — Other Ambulatory Visit (HOSPITAL_COMMUNITY): Payer: Self-pay | Admitting: Internal Medicine

## 2012-09-17 ENCOUNTER — Other Ambulatory Visit (HOSPITAL_COMMUNITY): Payer: Self-pay | Admitting: Obstetrics and Gynecology

## 2012-09-17 DIAGNOSIS — Z1231 Encounter for screening mammogram for malignant neoplasm of breast: Secondary | ICD-10-CM

## 2012-09-25 ENCOUNTER — Ambulatory Visit (HOSPITAL_COMMUNITY)
Admission: RE | Admit: 2012-09-25 | Discharge: 2012-09-25 | Disposition: A | Discharge status: Home / Self Care | Payer: 59 | Source: Ambulatory Visit | Attending: Obstetrics and Gynecology | Admitting: Obstetrics and Gynecology

## 2012-09-25 DIAGNOSIS — Z1231 Encounter for screening mammogram for malignant neoplasm of breast: Secondary | ICD-10-CM | POA: Insufficient documentation

## 2012-09-26 ENCOUNTER — Emergency Department (HOSPITAL_COMMUNITY)
Admission: EM | Admit: 2012-09-26 | Discharge: 2012-09-26 | Disposition: A | Discharge status: Home / Self Care | Payer: 59 | Attending: Emergency Medicine | Admitting: Emergency Medicine

## 2012-09-26 ENCOUNTER — Emergency Department (HOSPITAL_COMMUNITY)
Admission: EM | Admit: 2012-09-26 | Discharge: 2012-09-26 | Disposition: A | Discharge status: Home / Self Care | Payer: 59 | Source: Home / Self Care

## 2012-09-26 ENCOUNTER — Encounter (HOSPITAL_COMMUNITY): Payer: Self-pay

## 2012-09-26 ENCOUNTER — Encounter (HOSPITAL_COMMUNITY): Payer: Self-pay | Admitting: Emergency Medicine

## 2012-09-26 DIAGNOSIS — Z79899 Other long term (current) drug therapy: Secondary | ICD-10-CM | POA: Insufficient documentation

## 2012-09-26 DIAGNOSIS — R221 Localized swelling, mass and lump, neck: Secondary | ICD-10-CM

## 2012-09-26 DIAGNOSIS — K219 Gastro-esophageal reflux disease without esophagitis: Secondary | ICD-10-CM | POA: Insufficient documentation

## 2012-09-26 DIAGNOSIS — Z8739 Personal history of other diseases of the musculoskeletal system and connective tissue: Secondary | ICD-10-CM | POA: Insufficient documentation

## 2012-09-26 DIAGNOSIS — R220 Localized swelling, mass and lump, head: Secondary | ICD-10-CM

## 2012-09-26 DIAGNOSIS — R1310 Dysphagia, unspecified: Secondary | ICD-10-CM | POA: Insufficient documentation

## 2012-09-26 DIAGNOSIS — J029 Acute pharyngitis, unspecified: Secondary | ICD-10-CM

## 2012-09-26 DIAGNOSIS — H9209 Otalgia, unspecified ear: Secondary | ICD-10-CM | POA: Insufficient documentation

## 2012-09-26 DIAGNOSIS — I10 Essential (primary) hypertension: Secondary | ICD-10-CM | POA: Insufficient documentation

## 2012-09-26 DIAGNOSIS — M542 Cervicalgia: Secondary | ICD-10-CM | POA: Insufficient documentation

## 2012-09-26 DIAGNOSIS — D649 Anemia, unspecified: Secondary | ICD-10-CM | POA: Insufficient documentation

## 2012-09-26 LAB — POCT RAPID STREP A: Streptococcus, Group A Screen (Direct): NEGATIVE

## 2012-09-26 MED ORDER — PENICILLIN G BENZATHINE 1200000 UNIT/2ML IM SUSP
1.2000 10*6.[IU] | Freq: Once | INTRAMUSCULAR | Status: AC
  Administered 2012-09-26: 1.2 10*6.[IU] via INTRAMUSCULAR
  Filled 2012-09-26: qty 2

## 2012-09-26 MED ORDER — LIDOCAINE VISCOUS 2 % MT SOLN
20.0000 mL | Freq: Once | OROMUCOSAL | Status: AC
  Administered 2012-09-26: 20 mL via OROMUCOSAL
  Filled 2012-09-26: qty 15

## 2012-09-26 MED ORDER — KETOROLAC TROMETHAMINE 30 MG/ML IJ SOLN
30.0000 mg | Freq: Once | INTRAMUSCULAR | Status: AC
  Administered 2012-09-26: 30 mg via INTRAMUSCULAR
  Filled 2012-09-26: qty 1

## 2012-09-26 MED ORDER — AMOXICILLIN 500 MG PO CAPS: 1000.0000 mg | ORAL_CAPSULE | Freq: Two times a day (BID) | ORAL | Status: DC

## 2012-09-26 MED ORDER — DEXAMETHASONE 1 MG/ML PO CONC
10.0000 mg | Freq: Once | ORAL | Status: AC
  Administered 2012-09-26: 10 mg via ORAL
  Filled 2012-09-26: qty 10

## 2012-09-26 NOTE — ED Notes (Signed)
Rx x 0.  Pt voiced understanding to f/u with PCP and return for worsening condition.  

## 2012-09-26 NOTE — ED Notes (Signed)
C/o sore throat since last night. Posterior nasopharynx reddened, swollen; NAD, handling secretions well

## 2012-09-26 NOTE — ED Notes (Signed)
C/o sore throat on R side and painful swallowing since yesterday.  Pt states she was seen at [facility] and sent to ED for further eval.  Reports headache since this afternoon and pain on R side of neck and posterior neck when she turns her head.

## 2012-09-26 NOTE — ED Provider Notes (Signed)
History     CSN: [id number]  Arrival date & time 09/26/12  2002   First MD Initiated Contact with Patient 09/26/12 2118      Chief Complaint  Patient presents with  . Sore Throat    (Consider location/radiation/quality/duration/timing/severity/associated sxs/prior treatment) HPI  Patient presents with concern over sore throat.  Symptoms began approximately 30 hours ago, since onset has become worse.  There is focal fullness about the right side of the patient's neck and throat.  There is associated pain in that area with lateral head motion, but no pain with anterior/posterior neck motion.  There is no dyspnea, there is mild dysphagia.  There is no fever, no chest pain, no chills or fever. No clear alleviating or exacerbating factors. The patient was seen in urgent care, sent here for further evaluation and management.  Past Medical History  Diagnosis Date  . GERD (gastroesophageal reflux disease)   . Hypertension   . Blood transfusion   . Anemia   . History of blood transfusion   . Arthritis     Past Surgical History  Procedure Date  . Rotator cuff repair 2010  . Carpal tunnel repair 2008  . Colonoscopy   . Vaginal hysterectomy 2001    Family History  Problem Relation Age of Onset  . Kidney disease Brother   . Esophageal cancer Maternal Uncle   . Colon cancer Neg Hx   . Rectal cancer Neg Hx   . Stomach cancer Neg Hx     History  Substance Use Topics  . Smoking status: Never Smoker   . Smokeless tobacco: Never Used  . Alcohol Use: No    OB History    Grav Para Term Preterm Abortions TAB SAB Ect Mult Living                  Review of Systems  Constitutional:       Per HPI, otherwise negative  HENT:       Per HPI, otherwise negative  Eyes: Negative.   Respiratory:       Per HPI, otherwise negative  Cardiovascular:       Per HPI, otherwise negative  Gastrointestinal: Negative for vomiting.  Genitourinary: Negative.   Musculoskeletal:       Per  HPI, otherwise negative  Skin: Negative.   Neurological: Negative for syncope.    Allergies  Review of patient's allergies indicates no known allergies.  Home Medications   Current Outpatient Rx  Name  Route  Sig  Dispense  Refill  . DOCUSATE SODIUM 100 MG PO CAPS   Oral   Take 100 mg by mouth 2 (two) times daily.           . IRON 325 (65 FE) MG PO TABS   Oral   Take 1 tablet by mouth 2 (two) times daily. For iron deficiency          . FUROSEMIDE 20 MG PO TABS   Oral   Take 20 mg by mouth daily.         [name] PANTOPRAZOLE SODIUM 40 MG PO TBEC   Oral   Take 40 mg by mouth daily.           . SUMATRIPTAN SUCCINATE 25 MG PO TABS   Oral   Take 25 mg by mouth as needed. For migraines - may repeat in 1 hour         . SUMATRIPTAN SUCCINATE 25 MG PO TABS  1 tab by mouth. May repeat in 1 hour            BP 152/78  Pulse 87  Temp 98.1 F (36.7 C)  Resp 16  SpO2 97%  Physical Exam  Nursing note and vitals reviewed. Constitutional: She is oriented to person, place, and time. She appears well-developed and well-nourished. No distress.  HENT:  Head: Normocephalic and atraumatic.  Mouth/Throat: Oropharynx is clear and moist and mucous membranes are normal.       Unable to visual the uvula, or posterior oral pharynx as the patient retracts her tongue with exam.  The tongue is symmetric, there is no appreciable asymmetry of the face, and there is mild tenderness to palpation about the right lateral neck anterior to the ear.  Eyes: Conjunctivae normal and EOM are normal.  Neck: Trachea normal, normal range of motion and full passive range of motion without pain. Neck supple. No spinous process tenderness and no muscular tenderness present. No erythema and normal range of motion present.  Cardiovascular: Normal rate and regular rhythm.   Pulmonary/Chest: Effort normal and breath sounds normal. No stridor. No respiratory distress.  Abdominal: She exhibits no distension.   Musculoskeletal: She exhibits no edema.  Lymphadenopathy:       Head (right side): Preauricular adenopathy present. No posterior auricular and no occipital adenopathy present.       Head (left side): No posterior auricular and no occipital adenopathy present.    She has cervical adenopathy.       Right cervical: Superficial cervical adenopathy present.       Left cervical: No superficial cervical, no deep cervical and no posterior cervical adenopathy present.  Neurological: She is alert and oriented to person, place, and time. She displays no atrophy and no tremor. No cranial nerve deficit or sensory deficit. She exhibits normal muscle tone. She displays no seizure activity.  Skin: Skin is warm and dry.  Psychiatric: She has a normal mood and affect.    ED Course  Procedures (including critical care time)  Labs Reviewed - No data to display Mm Digital Screening  09/26/2012  *RADIOLOGY REPORT*  Clinical Data: Screening.  DIGITAL BILATERAL SCREENING MAMMOGRAM WITH CAD  Comparison:  Previous exams.  FINDINGS:  ACR Breast Density Category 2: There is a scattered fibroglandular pattern.  No suspicious masses, architectural distortion, or calcifications are present.  Images were processed with CAD.  IMPRESSION: No mammographic evidence of malignancy.  A result letter of this screening mammogram will be mailed directly to the patient.  RECOMMENDATION: Screening mammogram in one year. (Code:SM-B-01Y)  BI-RADS CATEGORY 1:  Negative.   Original Report Authenticated By: Sherian Rein, M.D.      1. Pharyngitis   2. Head or neck swelling, mass, or lump     At length I discussed the pros and cons of radiographic imaging with the patient.  Specifically CT of her neck to evaluate to the inability to visualize the posterior oropharynx.  Given the absence of dyspnea, fever, distress, there is low suspicion for airspace infection or significant abscess, though these are the considerations we discussed.  The  patient states that she has a family history of laryngeal carcinoma.  She stated an understanding of return precautions, explicitly, and stated a preference for discharge with medications and PMD followup.    MDM  This generally well-appearing female presents with 1+ day of sore throat with dysphagia, no dyspnea, no chest pain, no fever.  Neck is supple, and absent fever,  rash, distress, there is little suspicion for acute systemic toxicity.  The inability to visualize the posterior oropharynx is limiting, but given the absence of clinical signs of distress, definite indication for CT imaging is not concrete.  After a long discussion with the patient she'll be treated empirically for strep pharyngitis, received steroids, analgesics, and was discharged in stable condition, though with explicit return precautions     Gerhard Munch, MD 09/26/12 2153

## 2012-09-26 NOTE — ED Provider Notes (Signed)
History     CSN: [id number]  Arrival date & time 09/26/12  2002   None     Chief Complaint  Patient presents with  . Sore Throat    (Consider location/radiation/quality/duration/timing/severity/associated sxs/prior treatment) HPI Comments: 57 year old obese female presents with chief complaint of "I feel bad". More specifically she is complaining of pain on the right side of her throat was swelling in the right neck. She states it hurts to turn her neck to the left or right. She denies pain on the left side of the neck. She is able to swallow but it is painful. She denies fever chills, vomiting, chest pain, shortness of breath or abdominal pain. The symptoms began yesterday and have progressively worsened.   Past Medical History  Diagnosis Date  . GERD (gastroesophageal reflux disease)   . Hypertension   . Blood transfusion   . Anemia   . History of blood transfusion   . Arthritis     Past Surgical History  Procedure Date  . Rotator cuff repair 2010  . Carpal tunnel repair 2008  . Colonoscopy   . Vaginal hysterectomy 2001    Family History  Problem Relation Age of Onset  . Kidney disease Brother   . Esophageal cancer Maternal Uncle   . Colon cancer Neg Hx   . Rectal cancer Neg Hx   . Stomach cancer Neg Hx     History  Substance Use Topics  . Smoking status: Never Smoker   . Smokeless tobacco: Never Used  . Alcohol Use: No    OB History    Grav Para Term Preterm Abortions TAB SAB Ect Mult Living                  Review of Systems  Constitutional: Positive for activity change. Negative for fever.  HENT: Positive for ear pain, sore throat, neck pain and neck stiffness. Negative for congestion, facial swelling, rhinorrhea, drooling and postnasal drip.   Respiratory: Negative.   Gastrointestinal: Negative.   Skin: Negative.   Neurological: Negative.   Psychiatric/Behavioral: Negative.     Allergies  Review of patient's allergies indicates no known  allergies.  Home Medications   Current Outpatient Rx  Name  Route  Sig  Dispense  Refill  . DIAZEPAM 10 MG PO TABS      Take one hour before MRI   1 tablet   0   . DICLOFENAC SODIUM 75 MG PO TBEC   Oral   Take 1 tablet (75 mg total) by mouth 2 (two) times daily.   60 tablet   3   . DOCUSATE SODIUM 100 MG PO CAPS   Oral   Take 100 mg by mouth 2 (two) times daily.           . ERGOCALCIFEROL 50000 UNITS PO CAPS   Oral   Take 50,000 Units by mouth once a week. For 8 weeks          . IRON 325 (65 FE) MG PO TABS   Oral   Take 1 tablet by mouth 2 (two) times daily. For iron deficiency          . FEXOFENADINE HCL 60 MG PO TABS   Oral   Take 1 tablet (60 mg total) by mouth 2 (two) times daily.   20 tablet   0   . FUROSEMIDE 20 MG PO TABS      TAKE 1 TABLET BY MOUTH DAILY   30 tablet   12   .  GABAPENTIN 300 MG PO CAPS   Oral   Take 300 mg by mouth 3 (three) times daily.           [name] LISINOPRIL 10 MG PO TABS   Oral   Take 10 mg by mouth daily.           . MELOXICAM 15 MG PO TABS      Take 1 tablet by mouth daily for 2 weeks, then as needed.   30 tablet   1   . PANTOPRAZOLE SODIUM 40 MG PO TBEC   Oral   Take 40 mg by mouth daily.           . SUMATRIPTAN SUCCINATE 25 MG PO TABS      1 tab by mouth. May repeat in 1 hour            There were no vitals taken for this visit.  Physical Exam  Nursing note and vitals reviewed. Constitutional: She is oriented to person, place, and time. She appears well-developed and well-nourished. No distress.  HENT:       Bilateral TMs are normal. Am unable to visualize the pharynx. I attempted many times with and without a tongue depressor but the patient consistently and persistently reinforced the tongue to the roof of the mouth and would not allow visual access. Even when attempting to produce a gag reflex for just a one second look she would withdraw.   Eyes: EOM are normal.  Neck:       Decreased range of  motion due to right lateral neck pain. There is tenderness along the lateral aspect of the neck part of which is the musculature. There is swelling of the right neck with much tenderness. Unable to palpate well enough to assess for lymphadenitis as the patient would withdraw and complained of local pain. The patient is able to open her mouth sufficiently wide without pain or evidence of trismus.  Cardiovascular: Normal rate and normal heart sounds.   Pulmonary/Chest: Effort normal and breath sounds normal. No respiratory distress. She has no wheezes.  Lymphadenopathy:    She has cervical adenopathy.  Neurological: She is alert and oriented to person, place, and time. She exhibits normal muscle tone.  Skin: Skin is warm and dry.  Psychiatric: She has a normal mood and affect.    ED Course  Procedures (including critical care time)  Labs Reviewed - No data to display Mm Digital Screening  09/26/2012  *RADIOLOGY REPORT*  Clinical Data: Screening.  DIGITAL BILATERAL SCREENING MAMMOGRAM WITH CAD  Comparison:  Previous exams.  FINDINGS:  ACR Breast Density Category 2: There is a scattered fibroglandular pattern.  No suspicious masses, architectural distortion, or calcifications are present.  Images were processed with CAD.  IMPRESSION: No mammographic evidence of malignancy.  A result letter of this screening mammogram will be mailed directly to the patient.  RECOMMENDATION: Screening mammogram in one year. (Code:SM-B-01Y)  BI-RADS CATEGORY 1:  Negative.   Original Report Authenticated By: Sherian Rein, M.D.      1. Pharyngitis   2. Head or neck swelling, mass, or lump       MDM  The patient has moderate to severe pain in the right lateral neck extending to the ear and face. I am unable to visualize the pharynx due to the patient's inability to allow tongue depression or positioning her tongue in such a way to observe the pharynx as described above. The strep test was negative but uncertain as to  the accuracy of sampling. Because I am unable to perform an adequate exam and the patient's history and other physical findings suggest there may be a potentially serious condition she will be transferred to the emergency department. Differential would include lymphadenopathy, retropharyngeal or peritonsillar abscess, neck mass of unknown etiology.        Hayden Rasmussen, NP 09/26/12 4098  Hayden Rasmussen, NP 09/26/12 2025

## 2012-10-04 ENCOUNTER — Other Ambulatory Visit: Payer: Self-pay

## 2012-12-12 ENCOUNTER — Ambulatory Visit (INDEPENDENT_AMBULATORY_CARE_PROVIDER_SITE_OTHER): Payer: 59 | Admitting: Family Medicine

## 2012-12-12 ENCOUNTER — Encounter (HOSPITAL_COMMUNITY): Payer: Self-pay | Admitting: Radiology

## 2012-12-12 ENCOUNTER — Other Ambulatory Visit: Payer: Self-pay

## 2012-12-12 ENCOUNTER — Encounter: Payer: Self-pay | Admitting: Family Medicine

## 2012-12-12 ENCOUNTER — Emergency Department (HOSPITAL_COMMUNITY): Payer: 59

## 2012-12-12 ENCOUNTER — Emergency Department (HOSPITAL_COMMUNITY)
Admission: EM | Admit: 2012-12-12 | Discharge: 2012-12-12 | Disposition: A | Discharge status: Home / Self Care | Payer: 59 | Attending: Emergency Medicine | Admitting: Emergency Medicine

## 2012-12-12 ENCOUNTER — Encounter (HOSPITAL_COMMUNITY): Payer: Self-pay | Admitting: *Deleted

## 2012-12-12 VITALS — BP 149/81 | Ht 63.0 in | Wt 265.0 lb

## 2012-12-12 DIAGNOSIS — M542 Cervicalgia: Secondary | ICD-10-CM

## 2012-12-12 DIAGNOSIS — K219 Gastro-esophageal reflux disease without esophagitis: Secondary | ICD-10-CM | POA: Insufficient documentation

## 2012-12-12 DIAGNOSIS — Z79899 Other long term (current) drug therapy: Secondary | ICD-10-CM | POA: Insufficient documentation

## 2012-12-12 DIAGNOSIS — Z8739 Personal history of other diseases of the musculoskeletal system and connective tissue: Secondary | ICD-10-CM | POA: Insufficient documentation

## 2012-12-12 DIAGNOSIS — R071 Chest pain on breathing: Secondary | ICD-10-CM | POA: Insufficient documentation

## 2012-12-12 DIAGNOSIS — Z8619 Personal history of other infectious and parasitic diseases: Secondary | ICD-10-CM | POA: Insufficient documentation

## 2012-12-12 DIAGNOSIS — I10 Essential (primary) hypertension: Secondary | ICD-10-CM | POA: Insufficient documentation

## 2012-12-12 DIAGNOSIS — Z862 Personal history of diseases of the blood and blood-forming organs and certain disorders involving the immune mechanism: Secondary | ICD-10-CM | POA: Insufficient documentation

## 2012-12-12 LAB — COMPREHENSIVE METABOLIC PANEL
Alkaline Phosphatase: 87 U/L (ref 39–117)
BUN: 10 mg/dL (ref 6–23)
Chloride: 104 mEq/L (ref 96–112)
Creatinine, Ser: 0.7 mg/dL (ref 0.50–1.10)
GFR calc Af Amer: >90 mL/min (ref >90)
GFR calc non Af Amer: >90 mL/min (ref >90)
Glucose, Bld: 101 mg/dL — ABNORMAL HIGH (ref 70–99)
Potassium: 3.9 mEq/L (ref 3.5–5.1)
Total Bilirubin: 0.3 mg/dL (ref 0.3–1.2)

## 2012-12-12 LAB — PROTIME-INR
INR: 1.07 (ref 0.00–1.49)
Prothrombin Time: 13.8 seconds (ref 11.6–15.2)

## 2012-12-12 LAB — CBC WITH DIFFERENTIAL/PLATELET
Basophils Relative: 0 % (ref 0–1)
Eosinophils Absolute: 0.2 10*3/uL (ref 0.0–0.7)
HCT: 39.8 % (ref 36.0–46.0)
Hemoglobin: 13.3 g/dL (ref 12.0–15.0)
Lymphocytes Relative: 39 % (ref 12–46)
Lymphs Abs: 3.1 10*3/uL (ref 0.7–4.0)
MCHC: 33.4 g/dL (ref 30.0–36.0)
MCV: 68 fL — ABNORMAL LOW (ref 78.0–100.0)
Monocytes Relative: 7 % (ref 3–12)
Neutro Abs: 4 10*3/uL (ref 1.7–7.7)

## 2012-12-12 LAB — APTT: aPTT: 26 seconds (ref 24–37)

## 2012-12-12 MED ORDER — KETOROLAC TROMETHAMINE 60 MG/2ML IM SOLN
60.0000 mg | Freq: Once | INTRAMUSCULAR | Status: AC
  Administered 2012-12-12: 60 mg via INTRAMUSCULAR

## 2012-12-12 MED ORDER — HYDROMORPHONE HCL PF 1 MG/ML IJ SOLN
1.0000 mg | Freq: Once | INTRAMUSCULAR | Status: AC
  Administered 2012-12-12: 1 mg via INTRAVENOUS
  Filled 2012-12-12: qty 1

## 2012-12-12 MED ORDER — HYDROCODONE-ACETAMINOPHEN 5-325 MG PO TABS: 2.0000 | ORAL_TABLET | ORAL | Status: DC | PRN

## 2012-12-12 MED ORDER — SODIUM CHLORIDE 0.9 % IV BOLUS (SEPSIS)
500.0000 mL | Freq: Once | INTRAVENOUS | Status: AC
  Administered 2012-12-12: 500 mL via INTRAVENOUS

## 2012-12-12 MED ORDER — CYCLOBENZAPRINE HCL 10 MG PO TABS: 10.0000 mg | ORAL_TABLET | Freq: Two times a day (BID) | ORAL | Status: DC | PRN

## 2012-12-12 MED ORDER — ONDANSETRON HCL 4 MG/2ML IJ SOLN
4.0000 mg | Freq: Once | INTRAMUSCULAR | Status: AC
  Administered 2012-12-12: 4 mg via INTRAVENOUS
  Filled 2012-12-12: qty 2

## 2012-12-12 MED ORDER — IOHEXOL 350 MG/ML SOLN
100.0000 mL | Freq: Once | INTRAVENOUS | Status: AC | PRN
  Administered 2012-12-12: 100 mL via INTRAVENOUS

## 2012-12-12 NOTE — ED Notes (Signed)
Pt sent here by pcp for r/o L carotid dissection.  L neck pain that radiates to L scapula.  Pcp unable to perform doppler d/t pain.

## 2012-12-12 NOTE — ED Notes (Signed)
Pt states to contact her granddaughter Evalee Mutton at [phone] if question or updates.

## 2012-12-12 NOTE — Progress Notes (Signed)
Chief complaint: Left neck pain  History of present illness: Patient is a 57 year old female who is coming in with acute onset of left neck pain and swelling. Patient states that it started yesterday while she was at work and had more of a swelling as well as erythema of the left trapezius going up the left side of her neck into the left side of her face. Patient states that people she worked with were attempting to give her a massage and stated that they felt it was more of muscle spasm. Patient comes in today unable to move her neck and states that the swelling is worsening. Patient states that she has severe tenderness to palpation. Patient denies any rash, denies any recent illnesses or any fevers or chills. Patient denies any visual changes but states she does have a headache. Patient has done this pain debilitating and was unable to go to work today and was barely able to sleep last night.  Past Medical History  Diagnosis Date  . GERD (gastroesophageal reflux disease)   . Hypertension   . Blood transfusion   . Anemia   . History of blood transfusion   . Arthritis   . Shingles    Past Surgical History  Procedure Laterality Date  . Rotator cuff repair  2010  . Carpal tunnel repair  2008  . Colonoscopy    . Vaginal hysterectomy  2001   Family History  Problem Relation Age of Onset  . Kidney disease Brother   . Esophageal cancer Maternal Uncle   . Colon cancer Neg Hx   . Rectal cancer Neg Hx   . Stomach cancer Neg Hx    History  Substance Use Topics  . Smoking status: Never Smoker   . Smokeless tobacco: Never Used  . Alcohol Use: No    Physical exam Blood pressure 149/81, height 5\' 3"  (1.6 m), weight 265 lb (120.203 kg). General: Patient is very uncomfortable and moderately distressed. Neck exam: On inspection she does have swelling of the left lateral neck going up just posterior to the angle of the mandible an anterior to the auricle of the ear. This area is swollen and  very severe tenderness to palpation.  The swelling does continue into the superior and posterior aspects of the trapezius going towards the deltoid muscle on the lateral aspect of the shoulder. She is also very tender to palpation in this vicinity as well. Patient is able to turn her head approximately 40 to the right and 10 to the left. Patient does have flexion and extension of the neck. She is neurovascularly intact in the upper extremities. Cardiovascular: Regular rate and rhythm no murmur appreciated in the area or the heart. Pulmonary: Clear to auscultation bilaterally Patient is able to move all extremities and is neurovascularly intact with 2+ DTRs  Assessment: Acute onset of neck swelling with severe pain to palpation on the left lateral neck. This is very concerning for multiple urgent problems. This includes carotid dissection, temporal arteritis as well as an upper tourniquet clot. This could also be early herpes zoster or potentially a severe muscle spasm but I feel these are more unlikely. It was our opinion today the patient would be sent to the emergency department for emergent imaging. We did discuss disc case with the triage nurse and send patient over with a letter stating her concerns. Patient was given a shot of Toradol today to help with the pain.

## 2012-12-12 NOTE — ED Notes (Signed)
ekg brought to dr. Adriana Simas, not miller.

## 2012-12-12 NOTE — Progress Notes (Signed)
[facility]: Attending Note:  We saw Ms Cothron in sports med clinic this morning for left neck, jaw and shoulder pain. She has severe hyperesthesia of left neck. We attempted an US of the neck but exam waws difficult secondary to her extreme pain.  We are concerned she has something emergent. Our differential includes possible carotid dissection, temporal arteritis, possibly UE clot although not at top of my list. I think she needs fairly emergent imaging and so we are sending her to the ED. I will cal the triage nurse and make her aware as well.   I spoke with Triage Nurse TRACY at 11:02 and emphasized we were concerned for the above issues.  You can contact me by pager at [phone] or at the [facility] office [phone] or [phone]  I have reviewed the chart, discussed wit the Sports Medicine Fellow. I agree with assessment and treatment plan as detailed in the Fellow's note.

## 2012-12-12 NOTE — ED Provider Notes (Signed)
History     CSN: [id number]  Arrival date & time 12/12/12  1109   First MD Initiated Contact with Patient 12/12/12 1125      Chief Complaint  Patient presents with  . Neck Pain    sent by md for poss corotic disection    (Consider location/radiation/quality/duration/timing/severity/associated sxs/prior treatment) HPI.... complains of pain in left lateral neck radiating to left upper anterior chest wall since yesterday morning. No trauma. No meningeal signs, fever, shortness of breath, radicular symptoms.  Positioning and palpation makes it worse. Patient was sent from her primary care office. Pain is moderate.  Past Medical History  Diagnosis Date  . GERD (gastroesophageal reflux disease)   . Hypertension   . Blood transfusion   . Anemia   . History of blood transfusion   . Arthritis   . Shingles     Past Surgical History  Procedure Laterality Date  . Rotator cuff repair  2010  . Carpal tunnel repair  2008  . Colonoscopy    . Vaginal hysterectomy  2001    Family History  Problem Relation Age of Onset  . Kidney disease Brother   . Esophageal cancer Maternal Uncle   . Colon cancer Neg Hx   . Rectal cancer Neg Hx   . Stomach cancer Neg Hx     History  Substance Use Topics  . Smoking status: Never Smoker   . Smokeless tobacco: Never Used  . Alcohol Use: No    OB History   Grav Para Term Preterm Abortions TAB SAB Ect Mult Living                  Review of Systems  All other systems reviewed and are negative.    Allergies  Review of patient's allergies indicates no known allergies.  Home Medications   Current Outpatient Rx  Name  Route  Sig  Dispense  Refill  . docusate sodium (COLACE) 100 MG capsule   Oral   Take 100 mg by mouth 2 (two) times daily.           . Ferrous Sulfate (IRON) 325 (65 FE) MG TABS   Oral   Take 1 tablet by mouth 2 (two) times daily. For iron deficiency          . furosemide (LASIX) 20 MG tablet   Oral   Take 20 mg  by mouth daily.         . meloxicam (MOBIC) 15 MG tablet   Oral   Take 15 mg by mouth daily as needed for pain.         . methocarbamol (ROBAXIN) 750 MG tablet   Oral   Take 750 mg by mouth every other day as needed. For muscle spasms         . pantoprazole (PROTONIX) 40 MG tablet   Oral   Take 40 mg by mouth daily.           . SUMAtriptan (IMITREX) 25 MG tablet      1 tab by mouth. May repeat in 1 hour          . SUMAtriptan (IMITREX) 25 MG tablet   Oral   Take 25 mg by mouth as needed. For migraines - may repeat in 1 hour           BP 136/56  Pulse 68  Temp(Src) 98 F (36.7 C) (Oral)  Resp 16  SpO2 94%  Physical Exam  Nursing note and vitals  reviewed. Constitutional: She is oriented to person, place, and time. She appears well-developed and well-nourished.  HENT:  Head: Normocephalic and atraumatic.  Eyes: Conjunctivae and EOM are normal. Pupils are equal, round, and reactive to light.  Neck:  Tender and edematous left lateral inferior neck  Cardiovascular: Normal rate, regular rhythm and normal heart sounds.   Pulmonary/Chest: Effort normal and breath sounds normal.  Tender left anterior superior chest wall  Abdominal: Soft. Bowel sounds are normal.  Musculoskeletal: Normal range of motion.  Neurological: She is alert and oriented to person, place, and time.  Skin: Skin is warm and dry.  Psychiatric: She has a normal mood and affect.    ED Course  Procedures (including critical care time)  Labs Reviewed  CBC WITH DIFFERENTIAL - Abnormal; Notable for the following:    RBC 5.85 (*)    MCV 68.0 (*)    MCH 22.7 (*)    All other components within normal limits  COMPREHENSIVE METABOLIC PANEL - Abnormal; Notable for the following:    Glucose, Bld 101 (*)    All other components within normal limits  APTT  PROTIME-INR   No results found.   No diagnosis found.   Date: 12/12/2012  Rate: 84  Rhythm: normal sinus rhythm  QRS Axis: normal   Intervals: normal  ST/T Wave abnormalities: normal  Conduction Disutrbances: none  Narrative Interpretation: unremarkable   Ct Angio Neck W/cm &/or Wo/cm  12/12/2012  *RADIOLOGY REPORT*  Clinical Data:  57 year old female with neck swelling, pain, hypertension.  Possible carotid dissection.  CT ANGIOGRAPHY NECK  Technique:  Multidetector CT imaging of the neck was performed using the standard protocol during bolus administration of intravenous contrast.  Multiplanar CT image reconstructions including MIPs were obtained to evaluate the vascular anatomy. Carotid stenosis measurements (when applicable) are obtained utilizing NASCET criteria, using the distal internal carotid diameter as the denominator.  Contrast: OMNIPAQUE IOHEXOL 350 MG/ML SOLN  Comparison:  Report of the [facility] neck CT 05/26/2001 (no images available).  Findings:  Dependent pulmonary atelectasis in the lung apices. Cardiomegaly.  No superior mediastinal lymphadenopathy. Mediastinal lipomatosis.  Negative thyroid, larynx, pharynx, parapharyngeal spaces, retropharyngeal space, sublingual space, submandibular glands, parotid glands, visualized brain parenchyma, orbit soft tissues. No cervical lymphadenopathy.  No inflammatory changes identified.  No fluid collection.  No superficial hematoma identified.  Small left maxillary sinus mucous retention cyst.  Other visualized paranasal sinuses and mastoids are clear.  Degenerative changes in cervical spine. No acute osseous abnormality identified.  Vascular Findings: Central pulmonary arteries incidentally included and appear patent.  Bovine arch configuration.  Tortuous proximal great vessels.  No arch atherosclerosis.  No great vessel origin stenosis.  Normal right CCA origin.  Normal right CCA aside from a partially retropharyngeal course.  Mild soft and calcified atherosclerotic plaque at the right carotid bifurcation.  Widely patent right ICA origin and bulb.  Negative  cervical right ICA otherwise.  Negative visualized right ICA siphon.  No proximal right subclavian artery stenosis.  Nondominant right vertebral artery origin is normal.  Normal cervical right vertebral artery, incidental fenestration at the skull base (series 5 image 108). Intracranial distal right vertebral artery that is included is normal.  The right PICA origin is normal.  Vertebrobasilar junction and visible basilar artery are normal.  Mildly tortuous and partially retropharyngeal left CCA is otherwise normal.  Left carotid bifurcation is widely patent.  Left ICA origin and bulb are within normal limits.  Cervical left ICA within normal  limits.  Negative visible left ICA siphon.  No proximal left subclavian artery stenosis.  Dominant left vertebral artery origin is normal aside from tortuosity.  Cervical left vertebral artery is normal.  The left vertebral artery remains dominant at the vertebrobasilar junction.  Normal left PICA origin.   Review of the MIP images confirms the above findings.  IMPRESSION:  Negative neck CTA.   Original Report Authenticated By: Erskine Speed, M.D.    Ct Chest W Contrast  12/12/2012  *RADIOLOGY REPORT*  Clinical Data: Left neck and upper chest swelling.  Headache. Hypertension.  CT CHEST WITH CONTRAST  Technique:  Multidetector CT imaging of the chest was performed following the standard protocol during bolus administration of intravenous contrast.  Contrast: OMNIPAQUE IOHEXOL 350 MG/ML SOLN  Comparison: Neck CTA obtained earlier today.  Findings: Minimal dependent atelectasis in both lower lobes.  No lung masses or enlarged lymph nodes.  No soft tissue abnormalities or asymmetry seen.  The examination is limited by streak artifacts produced by the patient's arms.  Small left renal cyst.  Mild thoracic spine degenerative changes.  IMPRESSION: No acute abnormality.   Original Report Authenticated By: Beckie Salts, M.D.        MDM   CT angio neck and CT chest negative  for masses or dissection.  Rx Norco #20 and Flexeril 10 mg #20.   No acute distress at discharge      Donnetta Hutching, MD 12/12/12 1816

## 2013-02-08 ENCOUNTER — Encounter: Payer: Self-pay | Admitting: Sports Medicine

## 2013-02-09 ENCOUNTER — Telehealth: Payer: Self-pay | Admitting: *Deleted

## 2013-02-09 NOTE — Telephone Encounter (Signed)
Pt states she is having the same type of neck swelling on rt side of neck that she was having on lt side in April.  States she wakes up in the mornings with a headache, and has nausea if she turns her head a certain way.  This has been going on for a few days.  Advised Dr. Margaretha Sheffield is out of town this week, and 1st available is 2nd week of July.  Advised her to be seen today at PCP office or urgent care.  Pt agreeable.  Asked pt to call me back and let me know when her appt is.

## 2013-02-09 NOTE — Telephone Encounter (Signed)
Pt states she spoke with her PCP's office and is going to their after hours clinic to be seen today at 5pm

## 2013-02-25 ENCOUNTER — Other Ambulatory Visit: Payer: Self-pay | Admitting: *Deleted

## 2013-02-25 MED ORDER — FUROSEMIDE 20 MG PO TABS: 20.0000 mg | ORAL_TABLET | Freq: Every day | ORAL | Status: DC

## 2013-03-09 ENCOUNTER — Ambulatory Visit: Payer: 59 | Admitting: Sports Medicine

## 2013-03-11 ENCOUNTER — Ambulatory Visit (HOSPITAL_COMMUNITY)
Admission: RE | Admit: 2013-03-11 | Discharge: 2013-03-11 | Disposition: A | Discharge status: Home / Self Care | Payer: 59 | Source: Ambulatory Visit | Attending: Sports Medicine | Admitting: Sports Medicine

## 2013-03-11 ENCOUNTER — Ambulatory Visit (INDEPENDENT_AMBULATORY_CARE_PROVIDER_SITE_OTHER): Payer: 59 | Admitting: Sports Medicine

## 2013-03-11 ENCOUNTER — Encounter: Payer: Self-pay | Admitting: Sports Medicine

## 2013-03-11 VITALS — BP 133/85 | HR 66 | Ht 63.0 in | Wt 265.0 lb

## 2013-03-11 DIAGNOSIS — R221 Localized swelling, mass and lump, neck: Secondary | ICD-10-CM

## 2013-03-11 DIAGNOSIS — Z01812 Encounter for preprocedural laboratory examination: Secondary | ICD-10-CM | POA: Insufficient documentation

## 2013-03-11 DIAGNOSIS — R220 Localized swelling, mass and lump, head: Secondary | ICD-10-CM

## 2013-03-11 DIAGNOSIS — R609 Edema, unspecified: Secondary | ICD-10-CM

## 2013-03-11 DIAGNOSIS — R600 Localized edema: Secondary | ICD-10-CM

## 2013-03-11 LAB — CREATININE, SERUM
Creatinine, Ser: 0.81 mg/dL (ref 0.50–1.10)
GFR calc Af Amer: >90 mL/min (ref >90)
GFR calc non Af Amer: 80 mL/min — ABNORMAL LOW (ref >90)

## 2013-03-11 MED ORDER — PREDNISONE (PAK) 10 MG PO TABS: 10.0000 mg | ORAL_TABLET | Freq: Every day | ORAL | Status: DC

## 2013-03-11 MED ORDER — DIAZEPAM 10 MG PO TABS: ORAL_TABLET | ORAL | Status: DC

## 2013-03-11 NOTE — Progress Notes (Signed)
  Subjective:    Patient ID: Andrea Montes, female    DOB: Oct 15, 1955, 57 y.o.   MRN: [id number]  HPI Patient comes in today complaining of intermittent swelling along the left side of her neck. Please see previous notes for detailed history regarding this. In short, patient last saw Dr. Jennette Kettle who actually sent her to the emergency room after concern was raised about a potential carotid dissection. A CT angiogram was basically unremarkable. She tells me that over the past several months she has been experiencing intermittent swelling is along the left side of her neck. It is limiting her ability to rotate her neck to the left. Her swelling does seem to improve with over-the-counter anti-inflammatories and occasional prednisone. When the swelling occurs she gets intermittent numbness and tingling down the left arm but no swelling in the arm. She is also complaining of some returning numbness and pain down the left leg. X-rays as well as an MRI of her lumbar spine showed facet arthropathy and she was ultimately referred to Dr. Danielle Dess who did injections in her back that were very beneficial. However, she feels like her symptoms are returning because she is having to park on the upper parking deck at work and walking down to her office is very painful.    Review of Systems     Objective:   Physical Exam Well-developed, well-nourished. No acute distress.  Cervical spine: Patient has limited cervical rotation to the left by about 50%. Full cervical rotation to the right. Full flexion and extension. There is definite soft tissue swelling along the left side of the neck primarily at the base of the neck in the supraclavicular region. It is nontender to palpation. It does not appear to be fluctuant. It is nonerythematous. No real tenderness to palpation along cervical midline. No weakness in either upper extremity. Good radial and ulnar pulses.  She has a negative straight leg raise bilaterally. No gross  focal neurological deficit of either lower extremity.  1+ pitting edema in the ankles and one third of the tibias bilaterally       Assessment & Plan:  1. Soft tissue swelling in the neck of unknown etiology 2. Lumbar facet arthropathy 3. Lower extremity edema  Previous ultrasound was inconclusive and CT angiogram was basically unremarkable. However, patient has definite swelling along the left side of her neck and is growing frustrated by this. Going to order an MRI of the soft tissue of the neck. In the meantime, over-the-counter anti-inflammatories do seem to help. He has responded to prednisone in the past as well so I called her in a 6 day Sterapred Dosepak just to have on hold with instructions to take if swelling is severe. I will send her to [facility] medical supply for some compression stockings for her lower extremity edema. I'll followup with the patient via telephone with the MRI findings once available. In regards to her returning low back pain I've agreed to give her permission to park in the lower parking deck at work. If symptoms persist she may need to return to Dr. Danielle Dess for further treatment.

## 2013-03-11 NOTE — Patient Instructions (Addendum)
MRI [facility] FRI JUL 25TH [address] [phone]

## 2013-03-11 NOTE — Addendum Note (Signed)
Addended by: Jacki Cones C on: 03/11/2013 02:06 PM   Modules accepted: Orders

## 2013-03-12 ENCOUNTER — Ambulatory Visit (HOSPITAL_COMMUNITY): Admission: RE | Admit: 2013-03-12 | Payer: 59 | Source: Ambulatory Visit

## 2013-03-12 ENCOUNTER — Ambulatory Visit (HOSPITAL_COMMUNITY)
Admission: RE | Admit: 2013-03-12 | Discharge: 2013-03-12 | Disposition: A | Discharge status: Home / Self Care | Payer: 59 | Source: Ambulatory Visit | Attending: Sports Medicine | Admitting: Sports Medicine

## 2013-03-12 MED ORDER — GADOBENATE DIMEGLUMINE 529 MG/ML IV SOLN
20.0000 mL | Freq: Once | INTRAVENOUS | Status: AC
  Administered 2013-03-12: 20 mL via INTRAVENOUS

## 2013-03-13 ENCOUNTER — Ambulatory Visit (HOSPITAL_COMMUNITY): Payer: 59

## 2013-03-14 ENCOUNTER — Ambulatory Visit (HOSPITAL_BASED_OUTPATIENT_CLINIC_OR_DEPARTMENT_OTHER)
Admission: RE | Admit: 2013-03-14 | Discharge: 2013-03-14 | Disposition: A | Discharge status: Home / Self Care | Payer: 59 | Source: Ambulatory Visit | Attending: Sports Medicine | Admitting: Sports Medicine

## 2013-03-14 MED ORDER — GADOBENATE DIMEGLUMINE 529 MG/ML IV SOLN
20.0000 mL | Freq: Once | INTRAVENOUS | Status: AC | PRN
  Administered 2013-03-14: 20 mL via INTRAVENOUS

## 2013-03-15 ENCOUNTER — Other Ambulatory Visit: Payer: 59

## 2013-03-16 ENCOUNTER — Ambulatory Visit (HOSPITAL_BASED_OUTPATIENT_CLINIC_OR_DEPARTMENT_OTHER)
Admission: RE | Admit: 2013-03-16 | Discharge: 2013-03-16 | Disposition: A | Discharge status: Home / Self Care | Payer: 59 | Source: Ambulatory Visit | Attending: Sports Medicine | Admitting: Sports Medicine

## 2013-03-16 ENCOUNTER — Other Ambulatory Visit: Payer: Self-pay | Admitting: Sports Medicine

## 2013-03-16 DIAGNOSIS — R221 Localized swelling, mass and lump, neck: Secondary | ICD-10-CM

## 2013-03-16 DIAGNOSIS — M542 Cervicalgia: Secondary | ICD-10-CM | POA: Insufficient documentation

## 2013-03-16 DIAGNOSIS — R220 Localized swelling, mass and lump, head: Secondary | ICD-10-CM | POA: Insufficient documentation

## 2013-03-16 DIAGNOSIS — M47812 Spondylosis without myelopathy or radiculopathy, cervical region: Secondary | ICD-10-CM | POA: Insufficient documentation

## 2013-03-16 DIAGNOSIS — M25519 Pain in unspecified shoulder: Secondary | ICD-10-CM | POA: Insufficient documentation

## 2013-03-18 ENCOUNTER — Telehealth: Payer: Self-pay | Admitting: Sports Medicine

## 2013-03-18 NOTE — Telephone Encounter (Signed)
I spoke with Andrea Montes on the phone this morning regarding the MRI scan of the soft tissues of her neck. It is an unremarkable study. At this point I am at a loss to explain the intermittent swelling that she is getting along the left side of her neck. Musculoskeletal workup to date including a CT angiogram has been unremarkable. There is no evidence of any sort of soft tissue mass or obvious vascular pathology. My recommendation is for her to followup with her primary care physician to see if there is some sort of medical reason that she may be experiencing the symptoms. Followup with me when necessary.

## 2013-06-25 ENCOUNTER — Other Ambulatory Visit: Payer: Self-pay

## 2013-10-22 ENCOUNTER — Emergency Department (HOSPITAL_COMMUNITY)
Admission: EM | Admit: 2013-10-22 | Discharge: 2013-10-22 | Disposition: A | Discharge status: Home / Self Care | Payer: 59 | Source: Home / Self Care | Attending: Emergency Medicine | Admitting: Emergency Medicine

## 2013-10-22 ENCOUNTER — Encounter (HOSPITAL_COMMUNITY): Payer: Self-pay | Admitting: Emergency Medicine

## 2013-10-22 DIAGNOSIS — L089 Local infection of the skin and subcutaneous tissue, unspecified: Secondary | ICD-10-CM

## 2013-10-22 HISTORY — DX: Vitamin D deficiency, unspecified: E55.9

## 2013-10-22 MED ORDER — VALACYCLOVIR HCL 1 G PO TABS: 1000.0000 mg | ORAL_TABLET | Freq: Three times a day (TID) | ORAL | Status: AC

## 2013-10-22 MED ORDER — HYDROCODONE-ACETAMINOPHEN 5-325 MG PO TABS: 1.0000 | ORAL_TABLET | ORAL | Status: DC | PRN

## 2013-10-22 NOTE — ED Notes (Signed)
C/o throbbing and itching on L buttocks onset 3 days ago. C/o burning stabbing pain now.  Skin appears excoriated, like blisters that opened and are raw. Hx. Shingles on R back 5-6 mos. ago.

## 2013-10-22 NOTE — Discharge Instructions (Signed)
Shingles Shingles (herpes zoster) is an infection that is caused by the same virus that causes chickenpox (varicella). The infection causes a painful skin rash and fluid-filled blisters, which eventually break open, crust over, and heal. It may occur in any area of the body, but it usually affects only one side of the body or face. The pain of shingles usually lasts about 1 month. However, some people with shingles may develop long-term (chronic) pain in the affected area of the body. Shingles often occurs many years after the person had chickenpox. It is more common:  In people older than 50 years.  In people with weakened immune systems, such as those with HIV, AIDS, or cancer.  In people taking medicines that weaken the immune system, such as transplant medicines.  In people under great stress. CAUSES  Shingles is caused by the varicella zoster virus (VZV), which also causes chickenpox. After a person is infected with the virus, it can remain in the person's body for years in an inactive state (dormant). To cause shingles, the virus reactivates and breaks out as an infection in a nerve root. The virus can be spread from person to person (contagious) through contact with open blisters of the shingles rash. It will only spread to people who have not had chickenpox. When these people are exposed to the virus, they may develop chickenpox. They will not develop shingles. Once the blisters scab over, the person is no longer contagious and cannot spread the virus to others. SYMPTOMS  Shingles shows up in stages. The initial symptoms may be pain, itching, and tingling in an area of the skin. This pain is usually described as burning, stabbing, or throbbing.In a few days or weeks, a painful red rash will appear in the area where the pain, itching, and tingling were felt. The rash is usually on one side of the body in a band or belt-like pattern. Then, the rash usually turns into fluid-filled blisters. They  will scab over and dry up in approximately 2 3 weeks. Flu-like symptoms may also occur with the initial symptoms, the rash, or the blisters. These may include:  Fever.  Chills.  Headache.  Upset stomach. DIAGNOSIS  Your caregiver will perform a skin exam to diagnose shingles. Skin scrapings or fluid samples may also be taken from the blisters. This sample will be examined under a microscope or sent to a lab for further testing. TREATMENT  There is no specific cure for shingles. Your caregiver will likely prescribe medicines to help you manage the pain, recover faster, and avoid long-term problems. This may include antiviral drugs, anti-inflammatory drugs, and pain medicines. HOME CARE INSTRUCTIONS   Take a cool bath or apply cool compresses to the area of the rash or blisters as directed. This may help with the pain and itching.   Only take over-the-counter or prescription medicines as directed by your caregiver.   Rest as directed by your caregiver.  Keep your rash and blisters clean with mild soap and cool water or as directed by your caregiver.  Do not pick your blisters or scratch your rash. Apply an anti-itch cream or numbing creams to the affected area as directed by your caregiver.  Keep your shingles rash covered with a loose bandage (dressing).  Avoid skin contact with:  Babies.   Pregnant women.   Children with eczema.   Elderly people with transplants.   People with chronic illnesses, such as leukemia or AIDS.   Wear loose-fitting clothing to help ease   the pain of material rubbing against the rash.  Keep all follow-up appointments with your caregiver.If the area involved is on your face, you may receive a referral for follow-up to a specialist, such as an eye doctor (ophthalmologist) or an ear, nose, and throat (ENT) doctor. Keeping all follow-up appointments will help you avoid eye complications, chronic pain, or disability.  SEEK IMMEDIATE MEDICAL  CARE IF:   You have facial pain, pain around the eye area, or loss of feeling on one side of your face.  You have ear pain or ringing in your ear.  You have loss of taste.  Your pain is not relieved with prescribed medicines.   Your redness or swelling spreads.   You have more pain and swelling.  Your condition is worsening or has changed.   You have a feveror persistent symptoms for more than 2 3 days.  You have a fever and your symptoms suddenly get worse. MAKE SURE YOU:  Understand these instructions.  Will watch your condition.  Will get help right away if you are not doing well or get worse. Document Released: 08/06/2005 Document Revised: 04/30/2012 Document Reviewed: 03/20/2012 [employer] Patient Information 2014 [employer].  

## 2013-10-22 NOTE — ED Provider Notes (Signed)
CSN: [id number]     Arrival date & time 10/22/13  1840 History   First MD Initiated Contact with Patient 10/22/13 2003     Chief Complaint  Patient presents with  . Rash   (Consider location/radiation/quality/duration/timing/severity/associated sxs/prior Treatment) HPI Comments: 58 year old female presents complaining of possible shingles on her left upper gluteal cleft. She has a history of shingles in a different spot that felt very similar to this. Her symptoms began 2 days ago. They began with a sensation of tingling and burning. The pain started soon after, and the pain has been getting progressively worse and worse. She had her daughter looked at the area and her daughter says it looks scratched. She has not had any discharge from the area. She denies any systemic symptoms at this time. The pain is constant and is worse with sitting down. No history of abscesses.   Past Medical History  Diagnosis Date  . GERD (gastroesophageal reflux disease)   . Hypertension   . Blood transfusion   . Anemia   . History of blood transfusion   . Arthritis   . Shingles   . Vitamin D deficiency    Past Surgical History  Procedure Laterality Date  . Rotator cuff repair  2010  . Carpal tunnel repair  2008  . Colonoscopy    . Vaginal hysterectomy  2001  . Steroid injections in back     Family History  Problem Relation Age of Onset  . Kidney disease Brother   . Hypertension Brother   . Esophageal cancer Maternal Uncle   . Colon cancer Neg Hx   . Rectal cancer Neg Hx   . Stomach cancer Neg Hx   . Leukemia Mother   . Hypertension Brother    History  Substance Use Topics  . Smoking status: Never Smoker   . Smokeless tobacco: Never Used  . Alcohol Use: No   OB History   Grav Para Term Preterm Abortions TAB SAB Ect Mult Living                 Review of Systems  Constitutional: Negative for fever and chills.  Eyes: Negative for visual disturbance.  Respiratory: Negative for cough and  shortness of breath.   Cardiovascular: Negative for chest pain, palpitations and leg swelling.  Gastrointestinal: Negative for nausea, vomiting and abdominal pain.  Endocrine: Negative for polydipsia and polyuria.  Genitourinary: Negative for dysuria, urgency and frequency.  Musculoskeletal: Negative for arthralgias and myalgias.  Skin: Positive for rash.       See history of present illness  Neurological: Negative for dizziness, weakness and light-headedness.    Allergies  Review of patient's allergies indicates no known allergies.  Home Medications   Current Outpatient Rx  Name  Route  Sig  Dispense  Refill  . cholecalciferol (VITAMIN D) 1000 UNITS tablet   Oral   Take 2,000 Units by mouth daily.         . cyclobenzaprine (FLEXERIL) 10 MG tablet   Oral   Take 1 tablet (10 mg total) by mouth 2 (two) times daily as needed for muscle spasms.   20 tablet   0   . docusate sodium (COLACE) 100 MG capsule   Oral   Take 100 mg by mouth 2 (two) times daily.           . Ferrous Sulfate (IRON) 325 (65 FE) MG TABS   Oral   Take 1 tablet by mouth 2 (two) times daily. For iron  deficiency          . furosemide (LASIX) 20 MG tablet   Oral   Take 1 tablet (20 mg total) by mouth daily.   30 tablet   6   . pantoprazole (PROTONIX) 40 MG tablet   Oral   Take 40 mg by mouth daily.           . diazepam (VALIUM) 10 MG tablet      Take one hour before procedure   2 tablet   0   . HYDROcodone-acetaminophen (NORCO) 5-325 MG per tablet   Oral   Take 2 tablets by mouth every 4 (four) hours as needed for pain.   20 tablet   0   . HYDROcodone-acetaminophen (NORCO) 5-325 MG per tablet   Oral   Take 1 tablet by mouth every 4 (four) hours as needed for moderate pain.   30 tablet   0   . meloxicam (MOBIC) 15 MG tablet   Oral   Take 15 mg by mouth daily as needed for pain.         . methocarbamol (ROBAXIN) 750 MG tablet   Oral   Take 750 mg by mouth every other day as  needed. For muscle spasms         . predniSONE (STERAPRED UNI-PAK) 10 MG tablet   Oral   Take 1 tablet (10 mg total) by mouth daily. Sterapred 6 day dose pack   21 tablet   0   . SUMAtriptan (IMITREX) 25 MG tablet      1 tab by mouth. May repeat in 1 hour          . SUMAtriptan (IMITREX) 25 MG tablet   Oral   Take 25 mg by mouth as needed. For migraines - may repeat in 1 hour         . valACYclovir (VALTREX) 1000 MG tablet   Oral   Take 1 tablet (1,000 mg total) by mouth 3 (three) times daily.   21 tablet   0    BP 144/77  Pulse 72  Temp(Src) 97.6 F (36.4 C) (Oral)  Resp 16  SpO2 98% Physical Exam  Nursing note and vitals reviewed. Constitutional: She is oriented to person, place, and time. Vital signs are normal. She appears well-developed and well-nourished. No distress.  HENT:  Head: Normocephalic and atraumatic.  Pulmonary/Chest: Effort normal. No respiratory distress.  Neurological: She is alert and oriented to person, place, and time. She has normal strength. Coordination normal.  Skin: Skin is warm and dry. Rash noted. Rash is vesicular. She is not diaphoretic.     Psychiatric: She has a normal mood and affect. Judgment normal.    ED Course  Procedures (including critical care time) Labs Review Labs Reviewed  HERPES SIMPLEX VIRUS CULTURE  WOUND CULTURE   Imaging Review No results found.   MDM   1. Skin infection    Patient seen in conjunction with the attending Dr. Jake Michaelis, this is most likely a viral infection such as HSV or shingles. HSV culture sent, bacterial culture sent as well. She will return if she has any worsening, we are treating with Valtrex and with Norco at this time.   Meds ordered this encounter  Medications  . valACYclovir (VALTREX) 1000 MG tablet    Sig: Take 1 tablet (1,000 mg total) by mouth 3 (three) times daily.    Dispense:  21 tablet    Refill:  0    Order Specific Question:  Supervising Provider    Answer:   Lynne Leader, Flowella  . HYDROcodone-acetaminophen (NORCO) 5-325 MG per tablet    Sig: Take 1 tablet by mouth every 4 (four) hours as needed for moderate pain.    Dispense:  30 tablet    Refill:  0    Order Specific Question:  Supervising Provider    Answer:  Lynne Leader, Dana       Liam Graham, PA-C 10/22/13 [phone]

## 2013-10-22 NOTE — ED Provider Notes (Signed)
Medical screening examination/treatment/procedure(s) were performed by non-physician practitioner and as supervising physician I was immediately available for consultation/collaboration.  Philipp Deputy, M.D.  Harden Mo, MD 10/22/13 [phone]

## 2013-10-25 LAB — WOUND CULTURE

## 2013-10-26 ENCOUNTER — Telehealth (HOSPITAL_COMMUNITY): Payer: Self-pay | Admitting: *Deleted

## 2013-10-26 LAB — HERPES SIMPLEX VIRUS CULTURE: CULTURE: DETECTED

## 2013-10-26 MED ORDER — AMOXICILLIN 875 MG PO TABS: 875.0000 mg | ORAL_TABLET | Freq: Two times a day (BID) | ORAL | Status: DC

## 2013-10-26 NOTE — ED Notes (Addendum)
Wound culture: Mod. Group B Strep (S. Agalactiae).  3/8 Message sent to Archie Balboa PA.  3/9  Zach sent a Rx. of Amoxicillin to [facility].  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  Lab shown to [employer]. He said to tell pt. to finish Valtrex.  He said he tried to call pt. twice and it went to voicemail. I called and left a message to call. Call 1. Roselyn Meier 10/26/2013  Pt. called back.  Pt. verified x 2 and given results.  Pt. told she needs Amoxicillin for the Strep infection and to finish all of Valtrex for the Herpes infection.  Pt. Told where to pick up her Rx.  Pt. Instructed to notify her partner, that she can pass the virus even when she doesn't have an outbreak, so always practice safe sex and to get treated for each outbreak with Acyclovir or Valtrex. I told her that she can notify her PCP  who can give her 1 yr Rx. to fill as needed or give suppressive therapy if frequent outbreaks. Roselyn Meier 10/26/2013

## 2013-11-19 ENCOUNTER — Other Ambulatory Visit (HOSPITAL_COMMUNITY): Payer: Self-pay | Admitting: Obstetrics and Gynecology

## 2013-11-19 DIAGNOSIS — Z1231 Encounter for screening mammogram for malignant neoplasm of breast: Secondary | ICD-10-CM

## 2013-11-27 ENCOUNTER — Ambulatory Visit (HOSPITAL_COMMUNITY)
Admission: RE | Admit: 2013-11-27 | Discharge: 2013-11-27 | Disposition: A | Discharge status: Home / Self Care | Payer: 59 | Source: Ambulatory Visit | Attending: Obstetrics and Gynecology | Admitting: Obstetrics and Gynecology

## 2013-11-27 DIAGNOSIS — Z1231 Encounter for screening mammogram for malignant neoplasm of breast: Secondary | ICD-10-CM | POA: Insufficient documentation

## 2014-04-19 ENCOUNTER — Ambulatory Visit (INDEPENDENT_AMBULATORY_CARE_PROVIDER_SITE_OTHER): Payer: 59 | Admitting: Sports Medicine

## 2014-04-19 ENCOUNTER — Encounter: Payer: Self-pay | Admitting: Sports Medicine

## 2014-04-19 VITALS — BP 128/71 | Ht 63.0 in | Wt 260.0 lb

## 2014-04-19 DIAGNOSIS — M47816 Spondylosis without myelopathy or radiculopathy, lumbar region: Secondary | ICD-10-CM

## 2014-04-19 DIAGNOSIS — M47817 Spondylosis without myelopathy or radiculopathy, lumbosacral region: Secondary | ICD-10-CM

## 2014-04-20 NOTE — Progress Notes (Signed)
   Subjective:    Patient ID: Andrea Montes, female    DOB: [birth year], 58 y.o.   MRN: [id number]  HPI chief complaint: Low back pain  Patient comes in today for followup on chronic low back pain. She has a well-documented history of lumbar facet arthropathy. She has been treated in the past with facet injections with some relief of symptoms but her most recent injection caused some sort of allergic reaction. She has been followed by Dr.Nudelman for this. Her most recent imaging was done in November. Patient reports that both x-rays and an MRI were done. Those images are not available for my review. Patient reports that Dr.Nudelman has suggested that she continue to treat her symptoms conservatively. She takes over-the-counter anti-inflammatories as needed with some relief. Pain is worse with prolonged walking. Also worse with going from a seated to standing position. Some radiating pain into her legs but no associated numbness or tingling. No groin pain.    Review of Systems     Objective:   Physical Exam Obese. No acute distress  Lumbar spine: Limited lumbar mobility secondary to pain. She has pain with both forward flexion and extension. No spasm. No tenderness over the SI joint. Negative straight leg raise bilaterally. Reflexes are equal the Achilles and patellar tendons bilaterally. No atrophy. Good strength.       Assessment & Plan:  Chronic low back pain secondary to advanced facet arthropathy  Patient will continue to manage her pain with over-the-counter anti-inflammatories. We will not plan on repeating her facet injections given her adverse affect to her last injection. She may occasionally need a day off of work if her pain is severe so I will recertify her FMLA paperwork to reflect this. Followup with me in one year or sooner if needed.

## 2014-05-17 ENCOUNTER — Other Ambulatory Visit (HOSPITAL_COMMUNITY): Payer: Self-pay | Admitting: Neurosurgery

## 2014-05-17 ENCOUNTER — Ambulatory Visit (HOSPITAL_COMMUNITY)
Admission: RE | Admit: 2014-05-17 | Discharge: 2014-05-17 | Disposition: A | Discharge status: Home / Self Care | Payer: 59 | Source: Ambulatory Visit | Attending: Neurosurgery | Admitting: Neurosurgery

## 2014-05-17 DIAGNOSIS — R29898 Other symptoms and signs involving the musculoskeletal system: Secondary | ICD-10-CM | POA: Insufficient documentation

## 2014-05-17 DIAGNOSIS — IMO0002 Reserved for concepts with insufficient information to code with codable children: Secondary | ICD-10-CM | POA: Insufficient documentation

## 2014-05-17 DIAGNOSIS — R209 Unspecified disturbances of skin sensation: Secondary | ICD-10-CM | POA: Diagnosis not present

## 2014-05-17 DIAGNOSIS — M5416 Radiculopathy, lumbar region: Secondary | ICD-10-CM

## 2014-05-18 ENCOUNTER — Other Ambulatory Visit: Payer: Self-pay | Admitting: Cardiovascular Disease

## 2014-05-18 NOTE — Telephone Encounter (Signed)
I don's see where Dr Johnsie Cancel has ever seen this patient. Please advise. Thanks, MI

## 2014-05-21 ENCOUNTER — Other Ambulatory Visit: Payer: Self-pay | Admitting: *Deleted

## 2014-05-21 MED ORDER — CYCLOBENZAPRINE HCL 10 MG PO TABS: 10.0000 mg | ORAL_TABLET | Freq: Two times a day (BID) | ORAL | Status: DC | PRN

## 2014-08-28 ENCOUNTER — Encounter: Payer: 59 | Attending: Internal Medicine | Admitting: Dietician

## 2014-08-28 DIAGNOSIS — Z6841 Body Mass Index (BMI) 40.0 and over, adult: Secondary | ICD-10-CM | POA: Insufficient documentation

## 2014-08-28 DIAGNOSIS — E669 Obesity, unspecified: Secondary | ICD-10-CM | POA: Diagnosis present

## 2014-08-28 DIAGNOSIS — Z713 Dietary counseling and surveillance: Secondary | ICD-10-CM | POA: Diagnosis not present

## 2014-08-28 NOTE — Progress Notes (Signed)
Patient was seen on 08/28/2014 for the Weight Loss Class at the [facility]. The following learning objectives were met by the patient during this class:   Describe healthy choices in each food group  Describe portion size of foods  Use plate method for meal planning  Demonstrate how to read Nutrition Facts food label  Set realistic goals for weight loss, diet changes, and physical activity.   Goals:  1. Make healthy food choices in each food group.  2. Reduce portion size of foods.  3. Increase fruit and vegetable intake.  4. Use plate method for meal planning.  5. Increase physical activity.    Handouts given:   1. Nutrition Strategies for Weight Loss   2. Meal plan/portion card   3. MyPlate Planner   4. Weight Management Recipe Resources   5. [employer], [place], [place]

## 2014-10-08 ENCOUNTER — Other Ambulatory Visit (HOSPITAL_COMMUNITY): Payer: Self-pay | Admitting: Orthopedic Surgery

## 2014-10-08 DIAGNOSIS — M25512 Pain in left shoulder: Secondary | ICD-10-CM

## 2014-10-11 ENCOUNTER — Inpatient Hospital Stay (HOSPITAL_COMMUNITY)
Admission: RE | Admit: 2014-10-11 | Discharge: 2014-10-11 | Disposition: A | Discharge status: Home / Self Care | Payer: 59 | Source: Ambulatory Visit

## 2014-10-11 ENCOUNTER — Other Ambulatory Visit (HOSPITAL_COMMUNITY): Payer: Self-pay | Admitting: Orthopedic Surgery

## 2014-10-11 ENCOUNTER — Ambulatory Visit (HOSPITAL_COMMUNITY)
Admission: RE | Admit: 2014-10-11 | Discharge: 2014-10-11 | Disposition: A | Discharge status: Home / Self Care | Payer: 59 | Source: Ambulatory Visit | Attending: Orthopedic Surgery | Admitting: Orthopedic Surgery

## 2014-10-11 DIAGNOSIS — M25512 Pain in left shoulder: Secondary | ICD-10-CM

## 2014-10-16 ENCOUNTER — Ambulatory Visit (HOSPITAL_BASED_OUTPATIENT_CLINIC_OR_DEPARTMENT_OTHER)
Admission: RE | Admit: 2014-10-16 | Discharge: 2014-10-16 | Disposition: A | Discharge status: Home / Self Care | Payer: 59 | Source: Ambulatory Visit | Attending: Orthopedic Surgery | Admitting: Orthopedic Surgery

## 2014-10-16 DIAGNOSIS — X58XXXA Exposure to other specified factors, initial encounter: Secondary | ICD-10-CM | POA: Insufficient documentation

## 2014-10-16 DIAGNOSIS — M7552 Bursitis of left shoulder: Secondary | ICD-10-CM | POA: Insufficient documentation

## 2014-10-16 DIAGNOSIS — S46812A Strain of other muscles, fascia and tendons at shoulder and upper arm level, left arm, initial encounter: Secondary | ICD-10-CM | POA: Diagnosis not present

## 2014-10-16 DIAGNOSIS — M129 Arthropathy, unspecified: Secondary | ICD-10-CM | POA: Diagnosis not present

## 2014-10-16 DIAGNOSIS — R200 Anesthesia of skin: Secondary | ICD-10-CM | POA: Diagnosis present

## 2014-10-19 DIAGNOSIS — M7542 Impingement syndrome of left shoulder: Secondary | ICD-10-CM

## 2014-10-19 DIAGNOSIS — M24112 Other articular cartilage disorders, left shoulder: Secondary | ICD-10-CM

## 2014-10-19 HISTORY — DX: Impingement syndrome of left shoulder: M75.42

## 2014-10-19 HISTORY — DX: Other articular cartilage disorders, left shoulder: M24.112

## 2014-10-25 ENCOUNTER — Other Ambulatory Visit: Payer: Self-pay | Admitting: Physician Assistant

## 2014-10-28 ENCOUNTER — Encounter (HOSPITAL_BASED_OUTPATIENT_CLINIC_OR_DEPARTMENT_OTHER): Payer: Self-pay | Admitting: *Deleted

## 2014-10-28 NOTE — Pre-Procedure Instructions (Signed)
To come for anesthesia airway evaluation. 

## 2014-10-28 NOTE — Pre-Procedure Instructions (Signed)
CMET results received from [place] at [place] - test performed 10/15/2014

## 2014-10-29 ENCOUNTER — Ambulatory Visit (INDEPENDENT_AMBULATORY_CARE_PROVIDER_SITE_OTHER): Payer: 59 | Admitting: Cardiovascular Disease

## 2014-10-29 DIAGNOSIS — Z01818 Encounter for other preprocedural examination: Secondary | ICD-10-CM

## 2014-10-29 NOTE — Progress Notes (Signed)
Patient ID: Andrea Montes, female   DOB: December 10, 1955, 59 y.o.   MRN: [id number] ECG only

## 2014-11-01 NOTE — Progress Notes (Signed)
Patient here for anesthesia consult for BMI >45. Dr Marcie Bal evaluated patient, [place] for [facility] She will bring CPAP machine DOS

## 2014-11-02 ENCOUNTER — Encounter: Payer: Self-pay | Admitting: Cardiovascular Disease

## 2014-11-02 NOTE — H&P (Signed)
[facility] [address], [place] [address] [phone] A Division of [facility]  Ninetta Lights, M.D.   Robert A. Noemi Chapel, M.D.   Faythe Casa, M.D.   Johnny Bridge, M.D.   Almedia Balls, M.D Ernesta Amble. Percell Miller, M.D.  Joseph Pierini, M.D.  Lanier Prude, M.D.    Verner Chol, M.D. Lovett Calender, PA- C  Mary L. Venida Jarvis, PA-C  Kirstin A. 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 She states this started insidiously about 2 weeks ago. No known injury or change in activity.  Pain is over the deltoid and occasionally radiates to the lateral aspect of her neck and down towards the elbow. She describes a constant toothache with associated cramping and weakness. Any movement is bothersome and she is unable to sleep on that side. She does have a little numbness over the deltoid and lateral aspect of her neck no radicular symptoms into the hand or fingers. She was seen in our Urgent Care on 09/25/14 and was given a subacromial injection with good relief for a week. She has history of a right shoulder attritional rotator cuff tear with subsequent rotator cuff repair 6 years ago.  Past medical, family, social history is detailed on the chart. Review of systems detailed in HPI all others are reviewed and are negative.   EXAMINATION: Well-developed well-nourished female in no acute distress. Alert and oriented x3. Exam of the left shoulder reveals active and passive motion to about 110 degrees. She has external rotation about 30 degrees internal rotation to her back pocket. Positive empty can ops cross body and [place]. Negative drop arm. She is neurovascularly intact distally.  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 2 views cervical spine show significant degenerative disc disease with anterior spurring C5-6, C6-7.  IMPRESSION: Questionable rotator cuff tear versus bursitis.  PLAN: Given the nature of the rotator cuff tear on the right shoulder we feel it's appropriate to obtain a left shoulder MRI to rule out rotator cuff tear. We feel symptoms are coming from the shoulder due to significant relief after the subacromial injection. She will call us when the MRI is complete. We will proceed with arthroscopy for decompression with or without rotator cuff repair depending on MRI. Discussed risks benefits and possible complications of the surgery and she understands.   Ninetta Lights, M.D.  Electronically verified by Ninetta Lights, M.D. DFM(MLS):kah D 10-08-14 T 10-11-14  [facility] [address] [place], [place] [address] [phone] A Division of [facility]  Ninetta Lights, M.D.   Robert A. Noemi Chapel, M.D.   Faythe Casa, M.D.   Johnny Bridge, M.D.   Almedia Balls, M.D Ernesta Amble. Percell Miller, M.D.  Joseph Pierini, M.D.  Lanier Prude, M.D.    Verner Chol, M.D. Lovett Calender, PA- C  Mary L. Venida Jarvis, PA-C  Kirstin A. Shepperson, PA-C  Josh Petersburg, PA-C  [place], [place]   RE: Andrea Montes, Andrea Montes  [id number]      DOB: [birth year] PROGRESS NOTE: 10-18-14 I called Kaedence to go over the MRI of her left shoulder.  Danicka has a history of right shoulder attritional rotator cuff tear and then repair six years ago.  She has done well with this.  She came in approximately one week ago with similar symptoms in the left shoulder.  She was given a subacromial injection which gave her relief, but nothing lasting greater than a week.  At that point we ordered an MRI.  I went over the MRI results with Rayvon this morning in regards to  her left shoulder.  She has a partial thickness tear of the distal supraspinatus with 1.1 centimeter retraction.  At this point we are proceeding with a left shoulder arthroscopic decompression and rotator cuff repair.  All of this was discussed with [name] at length today.  We will see her at the time of operative intervention.  Madalyn Rob, PA-C   Electronically verified by Ninetta Lights, M.D. DFM(MLS):jjh D 10-18-14 T 10-20-14

## 2014-11-04 ENCOUNTER — Encounter (HOSPITAL_BASED_OUTPATIENT_CLINIC_OR_DEPARTMENT_OTHER)
Admission: RE | Disposition: A | Discharge status: Home / Self Care | Payer: Self-pay | Source: Ambulatory Visit | Attending: Orthopedic Surgery

## 2014-11-04 ENCOUNTER — Ambulatory Visit (HOSPITAL_BASED_OUTPATIENT_CLINIC_OR_DEPARTMENT_OTHER): Payer: 59 | Admitting: Anesthesiology

## 2014-11-04 ENCOUNTER — Encounter (HOSPITAL_BASED_OUTPATIENT_CLINIC_OR_DEPARTMENT_OTHER): Payer: Self-pay | Admitting: *Deleted

## 2014-11-04 ENCOUNTER — Ambulatory Visit (HOSPITAL_BASED_OUTPATIENT_CLINIC_OR_DEPARTMENT_OTHER)
Admission: RE | Admit: 2014-11-04 | Discharge: 2014-11-04 | Disposition: A | Discharge status: Home / Self Care | Payer: 59 | Source: Ambulatory Visit | Attending: Orthopedic Surgery | Admitting: Orthopedic Surgery

## 2014-11-04 DIAGNOSIS — M75102 Unspecified rotator cuff tear or rupture of left shoulder, not specified as traumatic: Secondary | ICD-10-CM | POA: Diagnosis present

## 2014-11-04 DIAGNOSIS — G4730 Sleep apnea, unspecified: Secondary | ICD-10-CM | POA: Diagnosis not present

## 2014-11-04 DIAGNOSIS — M24112 Other articular cartilage disorders, left shoulder: Secondary | ICD-10-CM | POA: Diagnosis not present

## 2014-11-04 DIAGNOSIS — Z6841 Body Mass Index (BMI) 40.0 and over, adult: Secondary | ICD-10-CM | POA: Insufficient documentation

## 2014-11-04 DIAGNOSIS — M7542 Impingement syndrome of left shoulder: Secondary | ICD-10-CM | POA: Insufficient documentation

## 2014-11-04 DIAGNOSIS — K219 Gastro-esophageal reflux disease without esophagitis: Secondary | ICD-10-CM | POA: Diagnosis not present

## 2014-11-04 DIAGNOSIS — I10 Essential (primary) hypertension: Secondary | ICD-10-CM | POA: Diagnosis not present

## 2014-11-04 DIAGNOSIS — M89512 Osteolysis, left shoulder: Secondary | ICD-10-CM | POA: Insufficient documentation

## 2014-11-04 HISTORY — DX: Sleep apnea, unspecified: G47.30

## 2014-11-04 HISTORY — DX: Migraine, unspecified, not intractable, without status migrainosus: G43.909

## 2014-11-04 HISTORY — DX: Other intervertebral disc degeneration, lumbar region: M51.36

## 2014-11-04 HISTORY — DX: Localized edema: R60.0

## 2014-11-04 HISTORY — PX: SHOULDER ARTHROSCOPY WITH SUBACROMIAL DECOMPRESSION, ROTATOR CUFF REPAIR AND BICEP TENDON REPAIR: SHX5687

## 2014-11-04 HISTORY — DX: Presence of dental prosthetic device (complete) (partial): Z97.2

## 2014-11-04 HISTORY — DX: Other intervertebral disc degeneration, lumbar region without mention of lumbar back pain or lower extremity pain: M51.369

## 2014-11-04 HISTORY — DX: Other articular cartilage disorders, left shoulder: M24.112

## 2014-11-04 HISTORY — DX: Impingement syndrome of left shoulder: M75.42

## 2014-11-04 HISTORY — DX: Personal history of other infectious and parasitic diseases: Z86.19

## 2014-11-04 HISTORY — PX: RESECTION DISTAL CLAVICAL: SHX5053

## 2014-11-04 HISTORY — DX: Dental restoration status: Z98.811

## 2014-11-04 SURGERY — SHOULDER ARTHROSCOPY WITH SUBACROMIAL DECOMPRESSION, ROTATOR CUFF REPAIR AND BICEP TENDON REPAIR
Anesthesia: General | Site: Shoulder | Laterality: Left

## 2014-11-04 MED ORDER — MIDAZOLAM HCL 2 MG/ML PO SYRP: 12.0000 mg | ORAL_SOLUTION | Freq: Once | ORAL | Status: DC | PRN

## 2014-11-04 MED ORDER — HYDROMORPHONE HCL 1 MG/ML IJ SOLN: 0.2500 mg | INTRAMUSCULAR | Status: DC | PRN

## 2014-11-04 MED ORDER — FENTANYL CITRATE 0.05 MG/ML IJ SOLN
50.0000 ug | INTRAMUSCULAR | Status: DC | PRN
Start: 2014-11-04 — End: 2014-11-04
  Administered 2014-11-04: 100 ug via INTRAVENOUS

## 2014-11-04 MED ORDER — CHLORHEXIDINE GLUCONATE 4 % EX LIQD: 60.0000 mL | Freq: Once | CUTANEOUS | Status: DC

## 2014-11-04 MED ORDER — FENTANYL CITRATE 0.05 MG/ML IJ SOLN
INTRAMUSCULAR | Status: AC
  Filled 2014-11-04: qty 6

## 2014-11-04 MED ORDER — ONDANSETRON HCL 4 MG/2ML IJ SOLN
INTRAMUSCULAR | Status: DC | PRN
Start: 2014-11-04 — End: 2014-11-04
  Administered 2014-11-04: 4 mg via INTRAVENOUS

## 2014-11-04 MED ORDER — CEFAZOLIN SODIUM-DEXTROSE 2-3 GM-% IV SOLR
2.0000 g | INTRAVENOUS | Status: AC
  Administered 2014-11-04: 2 g via INTRAVENOUS

## 2014-11-04 MED ORDER — PROPOFOL 10 MG/ML IV BOLUS
INTRAVENOUS | Status: DC | PRN
  Administered 2014-11-04 (×2): 20 mg via INTRAVENOUS
  Administered 2014-11-04: 200 mg via INTRAVENOUS
  Administered 2014-11-04 (×2): 20 mg via INTRAVENOUS

## 2014-11-04 MED ORDER — BUPIVACAINE HCL (PF) 0.25 % IJ SOLN
INTRAMUSCULAR | Status: AC
  Filled 2014-11-04: qty 60

## 2014-11-04 MED ORDER — ONDANSETRON HCL 4 MG/2ML IJ SOLN: 4.0000 mg | Freq: Once | INTRAMUSCULAR | Status: DC | PRN

## 2014-11-04 MED ORDER — SUCCINYLCHOLINE CHLORIDE 20 MG/ML IJ SOLN
INTRAMUSCULAR | Status: DC | PRN
  Administered 2014-11-04: 100 mg via INTRAVENOUS

## 2014-11-04 MED ORDER — OXYCODONE-ACETAMINOPHEN 5-325 MG PO TABS: 1.0000 | ORAL_TABLET | ORAL | Status: DC | PRN

## 2014-11-04 MED ORDER — GLYCOPYRROLATE 0.2 MG/ML IJ SOLN
INTRAMUSCULAR | Status: DC | PRN
  Administered 2014-11-04: 0.2 mg via INTRAVENOUS

## 2014-11-04 MED ORDER — DEXAMETHASONE SODIUM PHOSPHATE 4 MG/ML IJ SOLN
INTRAMUSCULAR | Status: DC | PRN
  Administered 2014-11-04: 10 mg via INTRAVENOUS

## 2014-11-04 MED ORDER — EPHEDRINE SULFATE 50 MG/ML IJ SOLN
INTRAMUSCULAR | Status: DC | PRN
  Administered 2014-11-04 (×4): 10 mg via INTRAVENOUS

## 2014-11-04 MED ORDER — ONDANSETRON HCL 4 MG PO TABS: 4.0000 mg | ORAL_TABLET | Freq: Three times a day (TID) | ORAL | Status: DC | PRN

## 2014-11-04 MED ORDER — BUPIVACAINE HCL (PF) 0.5 % IJ SOLN
INTRAMUSCULAR | Status: AC
  Filled 2014-11-04: qty 60

## 2014-11-04 MED ORDER — OXYCODONE HCL 5 MG PO TABS: 5.0000 mg | ORAL_TABLET | Freq: Once | ORAL | Status: DC | PRN

## 2014-11-04 MED ORDER — MIDAZOLAM HCL 2 MG/2ML IJ SOLN
1.0000 mg | INTRAMUSCULAR | Status: DC | PRN
  Administered 2014-11-04: 2 mg via INTRAVENOUS

## 2014-11-04 MED ORDER — CEFAZOLIN SODIUM-DEXTROSE 2-3 GM-% IV SOLR
INTRAVENOUS | Status: AC
  Filled 2014-11-04: qty 50

## 2014-11-04 MED ORDER — MIDAZOLAM HCL 2 MG/2ML IJ SOLN
INTRAMUSCULAR | Status: AC
  Filled 2014-11-04: qty 2

## 2014-11-04 MED ORDER — LIDOCAINE HCL (CARDIAC) 10 MG/ML IV SOLN
INTRAVENOUS | Status: DC | PRN
  Administered 2014-11-04: 80 mg via INTRAVENOUS

## 2014-11-04 MED ORDER — BUPIVACAINE-EPINEPHRINE (PF) 0.5% -1:200000 IJ SOLN
INTRAMUSCULAR | Status: DC | PRN
  Administered 2014-11-04: 20 mL via PERINEURAL

## 2014-11-04 MED ORDER — FENTANYL CITRATE 0.05 MG/ML IJ SOLN
INTRAMUSCULAR | Status: AC
  Filled 2014-11-04: qty 2

## 2014-11-04 MED ORDER — LACTATED RINGERS IV SOLN
INTRAVENOUS | Status: DC
  Administered 2014-11-04: 08:00:00 via INTRAVENOUS

## 2014-11-04 MED ORDER — SODIUM CHLORIDE 0.9 % IR SOLN
Status: DC | PRN
  Administered 2014-11-04 (×2): 6000 mL

## 2014-11-04 MED ORDER — OXYCODONE HCL 5 MG/5ML PO SOLN: 5.0000 mg | Freq: Once | ORAL | Status: DC | PRN

## 2014-11-04 MED ORDER — LACTATED RINGERS IV SOLN
INTRAVENOUS | Status: DC
  Administered 2014-11-04 (×2): via INTRAVENOUS

## 2014-11-04 SURGICAL SUPPLY — 75 items
ANCH SUT SWLK 19.1X4.75 VT (Anchor) ×2 IMPLANT
ANCHOR PEEK 4.75X19.1 SWLK C (Anchor) ×2 IMPLANT
APL SKNCLS STERI-STRIP NONHPOA (GAUZE/BANDAGES/DRESSINGS)
BENZOIN TINCTURE PRP APPL 2/3 (GAUZE/BANDAGES/DRESSINGS) IMPLANT
BLADE CUTTER GATOR 3.5 (BLADE) ×3 IMPLANT
BLADE CUTTER MENIS 5.5 (BLADE) IMPLANT
BLADE GREAT WHITE 4.2 (BLADE) ×2 IMPLANT
BLADE SURG 15 STRL LF DISP TIS (BLADE) ×1 IMPLANT
BLADE SURG 15 STRL SS (BLADE) ×2
BUR OVAL 6.0 (BURR) ×2 IMPLANT
CANNULA DRY DOC 8X75 (CANNULA) ×1 IMPLANT
CANNULA TWIST IN 8.25X7CM (CANNULA) IMPLANT
DECANTER SPIKE VIAL GLASS SM (MISCELLANEOUS) IMPLANT
DRAPE STERI 35X30 U-POUCH (DRAPES) ×2 IMPLANT
DRAPE U-SHAPE 47X51 STRL (DRAPES) ×2 IMPLANT
DRAPE U-SHAPE 76X120 STRL (DRAPES) ×4 IMPLANT
DRSG PAD ABDOMINAL 8X10 ST (GAUZE/BANDAGES/DRESSINGS) ×2 IMPLANT
DURAPREP 26ML APPLICATOR (WOUND CARE) ×2 IMPLANT
ELECT MENISCUS 165MM 90D (ELECTRODE) ×2 IMPLANT
ELECT REM PT RETURN 9FT ADLT (ELECTROSURGICAL) ×2
ELECTRODE REM PT RTRN 9FT ADLT (ELECTROSURGICAL) ×1 IMPLANT
GAUZE SPONGE 4X4 12PLY STRL (GAUZE/BANDAGES/DRESSINGS) ×5 IMPLANT
GAUZE XEROFORM 1X8 LF (GAUZE/BANDAGES/DRESSINGS) ×2 IMPLANT
GLOVE BIOGEL M STRL SZ7.5 (GLOVE) ×1 IMPLANT
GLOVE BIOGEL PI IND STRL 6.5 (GLOVE) IMPLANT
GLOVE BIOGEL PI IND STRL 7.0 (GLOVE) ×1 IMPLANT
GLOVE BIOGEL PI INDICATOR 6.5 (GLOVE) ×1
GLOVE BIOGEL PI INDICATOR 7.0 (GLOVE) ×2
GLOVE ECLIPSE 6.5 STRL STRAW (GLOVE) ×2 IMPLANT
GLOVE ECLIPSE 7.0 STRL STRAW (GLOVE) ×2 IMPLANT
GLOVE ORTHO TXT STRL SZ7.5 (GLOVE) ×1 IMPLANT
GLOVE SURG ORTHO 8.0 STRL STRW (GLOVE) ×2 IMPLANT
GOWN STRL REUS W/ TWL LRG LVL3 (GOWN DISPOSABLE) ×2 IMPLANT
GOWN STRL REUS W/ TWL XL LVL3 (GOWN DISPOSABLE) ×1 IMPLANT
GOWN STRL REUS W/TWL LRG LVL3 (GOWN DISPOSABLE) ×6
GOWN STRL REUS W/TWL XL LVL3 (GOWN DISPOSABLE) ×2
IV NS IRRIG 3000ML ARTHROMATIC (IV SOLUTION) ×8 IMPLANT
MANIFOLD NEPTUNE II (INSTRUMENTS) ×2 IMPLANT
NDL SCORPION MULTI FIRE (NEEDLE) IMPLANT
NDL SUT 6 .5 CRC .975X.05 MAYO (NEEDLE) IMPLANT
NEEDLE MAYO TAPER (NEEDLE)
NEEDLE SCORPION MULTI FIRE (NEEDLE) ×2 IMPLANT
NS IRRIG 1000ML POUR BTL (IV SOLUTION) IMPLANT
PACK ARTHROSCOPY DSU (CUSTOM PROCEDURE TRAY) ×2 IMPLANT
PACK BASIN DAY SURGERY FS (CUSTOM PROCEDURE TRAY) ×2 IMPLANT
PASSER SUT SWANSON 36MM LOOP (INSTRUMENTS) IMPLANT
PENCIL BUTTON HOLSTER BLD 10FT (ELECTRODE) ×2 IMPLANT
SET ARTHROSCOPY TUBING (MISCELLANEOUS) ×2
SET ARTHROSCOPY TUBING LN (MISCELLANEOUS) ×1 IMPLANT
SLEEVE SCD COMPRESS KNEE MED (MISCELLANEOUS) ×1 IMPLANT
SLING ARM IMMOBILIZER LRG (SOFTGOODS) ×1 IMPLANT
SLING ARM IMMOBILIZER MED (SOFTGOODS) IMPLANT
SLING ARM LRG ADULT FOAM STRAP (SOFTGOODS) IMPLANT
SLING ARM MED ADULT FOAM STRAP (SOFTGOODS) IMPLANT
SLING ARM XL FOAM STRAP (SOFTGOODS) IMPLANT
SPONGE LAP 4X18 X RAY DECT (DISPOSABLE) ×1 IMPLANT
STRIP CLOSURE SKIN 1/2X4 (GAUZE/BANDAGES/DRESSINGS) IMPLANT
SUCTION FRAZIER TIP 10 FR DISP (SUCTIONS) IMPLANT
SUT ETHIBOND 2 OS 4 DA (SUTURE) IMPLANT
SUT ETHILON 2 0 FS 18 (SUTURE) IMPLANT
SUT ETHILON 3 0 PS 1 (SUTURE) IMPLANT
SUT FIBERWIRE #2 38 T-5 BLUE (SUTURE)
SUT RETRIEVER MED (INSTRUMENTS) IMPLANT
SUT TIGER TAPE 7 IN WHITE (SUTURE) ×2 IMPLANT
SUT VIC AB 0 CT1 27 (SUTURE)
SUT VIC AB 0 CT1 27XBRD ANBCTR (SUTURE) IMPLANT
SUT VIC AB 2-0 SH 27 (SUTURE)
SUT VIC AB 2-0 SH 27XBRD (SUTURE) IMPLANT
SUT VIC AB 3-0 FS2 27 (SUTURE) IMPLANT
SUTURE FIBERWR #2 38 T-5 BLUE (SUTURE) IMPLANT
TAPE FIBER 2MM 7IN #2 BLUE (SUTURE) ×1 IMPLANT
TOWEL OR 17X24 6PK STRL BLUE (TOWEL DISPOSABLE) ×2 IMPLANT
TOWEL OR NON WOVEN STRL DISP B (DISPOSABLE) ×2 IMPLANT
WATER STERILE IRR 1000ML POUR (IV SOLUTION) ×2 IMPLANT
YANKAUER SUCT BULB TIP NO VENT (SUCTIONS) IMPLANT

## 2014-11-04 NOTE — Interval H&P Note (Signed)
History and Physical Interval Note:  11/04/2014 7:32 AM  Andrea Montes  has presented today for surgery, with the diagnosis of other articular cartilage disorders, left shoulder, impingement syndrome of left shoulder, strain of muscle(s) and tendon(s) of the rotator cuff of left shoulder, initial encounter, [place]  The various methods of treatment have been discussed with the patient and family. After consideration of risks, benefits and other options for treatment, the patient has consented to  Procedure(s): LEFT SHOULDER ARTHROSCOPY WITH DEBRIDEMENT, DISTAL CLAVICLE EXCISION, ACROMIOPLASTY, ROTATOR CUFF REPAIR AND BICEP TENODESIS (Left) as a surgical intervention .  The patient's history has been reviewed, patient examined, no change in status, stable for surgery.  I have reviewed the patient's chart and labs.  Questions were answered to the patient's satisfaction.     [name]

## 2014-11-04 NOTE — Anesthesia Postprocedure Evaluation (Signed)
  Anesthesia Post-op Note  Patient: Andrea Montes  Procedure(s) Performed: Procedure(s): LEFT SHOULDER ARTHROSCOPY WITH DEBRIDEMENT, DISTAL CLAVICLE EXCISION, ACROMIOPLASTY, ROTATOR CUFF REPAIR AND BICEP TENODESIS (Left) RESECTION DISTAL CLAVICAL (Left)  Patient Location: PACU  Anesthesia Type: General with regional for post op pain   Level of Consciousness: awake, alert  and oriented  Airway and Oxygen Therapy: Patient Spontanous Breathing  Post-op Pain: none  Post-op Assessment: Post-op Vital signs reviewed  Post-op Vital Signs: Reviewed  Last Vitals:  Filed Vitals:   11/04/14 1145  BP: 136/68  Pulse: 90  Temp: 36.6 C  Resp: 16    Complications: No apparent anesthesia complications

## 2014-11-04 NOTE — Anesthesia Procedure Notes (Addendum)
Anesthesia Regional Block:  Interscalene brachial plexus block  Pre-Anesthetic Checklist: ,, timeout performed, Correct Patient, Correct Site, Correct Laterality, Correct Procedure, Correct Position, site marked, Risks and benefits discussed,  Surgical consent,  Pre-op evaluation,  At surgeon's request and post-op pain management  Laterality: Left and Upper  Prep: chloraprep       Needles:  Injection technique: Single-shot  Needle Type: Echogenic Needle     Needle Length: 5cm 5 cm Needle Gauge: 21 and 21 G    Additional Needles:  Procedures: ultrasound guided (picture in chart) Interscalene brachial plexus block Narrative:  Start time: 11/04/2014 8:17 AM End time: 11/04/2014 8:23 AM Injection made incrementally with aspirations every 5 mL.  Performed by: Personally  Anesthesiologist: CREWS, DAVID   Procedure Name: Intubation Date/Time: 11/04/2014 8:42 AM Performed by: Lyndee Leo Pre-anesthesia Checklist: Patient identified, Emergency Drugs available, Suction available and Patient being monitored Patient Re-evaluated:Patient Re-evaluated prior to inductionOxygen Delivery Method: Circle System Utilized Preoxygenation: Pre-oxygenation with 100% oxygen Intubation Type: IV induction Ventilation: Mask ventilation without difficulty Laryngoscope Size: Mac and 3 Grade View: Grade III Tube type: Oral Tube size: 7.0 mm Number of attempts: 1 Airway Equipment and Method: Stylet and Oral airway Placement Confirmation: ETT inserted through vocal cords under direct vision,  positive ETCO2 and breath sounds checked- equal and bilateral Secured at: 22 cm Tube secured with: Tape Dental Injury: Teeth and Oropharynx as per pre-operative assessment

## 2014-11-04 NOTE — Discharge Instructions (Signed)
Shouder arthroscopy, rotator cuff repair, subacromial decompression  Care After Instructions Refer to this sheet in the next few weeks. These discharge instructions provide you with general information on caring for yourself after you leave the hospital. Your caregiver may also give you specific instructions. Your treatment has been planned according to the most current medical practices available, but unavoidable complications sometimes occur. If you have any problems or questions after discharge, please call your caregiver.  HOME INSTRUCTIONS  You may resume a normal diet and activities as directed. ery Take showers instead of baths until informed otherwise.  Change bandages (dressings) in 3 days.  Swab wounds daily with betadine.  Wash shoulder with soap and water.  Pat dry.  Cover wounds with bandaids. Only take over-the-counter or prescription medicines for pain, discomfort, or fever as directed by your caregiver.  Wear your sling for the next 6 weeks unless otherwise instructed. Eat a well-balanced diet.  Avoid lifting or driving until you are instructed otherwise.  Make an appointment to see your caregiver for stitches (suture) or staple removal as directed.   SEEK MEDICAL CARE IF:  You have swelling of your calf or leg.  You develop shortness of breath or chest pain.  You have redness, swelling, or increasing pain in the wound.  There is pus or any unusual drainage coming from the surgical site.  You notice a bad smell coming from the surgical site or dressing.  The surgical site breaks open after sutures or staples have been removed.  There is persistent bleeding from the suture or staple line.  You are getting worse or are not improving.  You have any other questions or concerns.   SEEK IMMEDIATE MEDICAL CARE IF:   You have a fever greater than 101 You develop a rash.  You have difficulty breathing.  You develop any reaction or side effects to medicines given.  Your knee  motion is decreasing rather than improving.  MAKE SURE YOU:  Understand these instructions.  Will watch your condition.  Will get help right away if you are not doing well or get worse.    Post Anesthesia Home Care Instructions  Activity: Get plenty of rest for the remainder of the day. A responsible adult should stay with you for 24 hours following the procedure.  For the next 24 hours, DO NOT: -Drive a car -Paediatric nurse -Drink alcoholic beverages -Take any medication unless instructed by your physician -Make any legal decisions or sign important papers.  Meals: Start with liquid foods such as gelatin or soup. Progress to regular foods as tolerated. Avoid greasy, spicy, heavy foods. If nausea and/or vomiting occur, drink only clear liquids until the nausea and/or vomiting subsides. Call your physician if vomiting continues.  Special Instructions/Symptoms: Your throat may feel dry or sore from the anesthesia or the breathing tube placed in your throat during surgery. If this causes discomfort, gargle with warm salt water. The discomfort should disappear within 24 hours.  Regional Anesthesia Blocks  1. Numbness or the inability to move the "blocked" extremity may last from 3-48 hours after placement. The length of time depends on the medication injected and your individual response to the medication. If the numbness is not going away after 48 hours, call your surgeon.  2. The extremity that is blocked will need to be protected until the numbness is gone and the  Strength has returned. Because you cannot feel it, you will need to take extra care to avoid injury. Because it may  be weak, you may have difficulty moving it or using it. You may not know what position it is in without looking at it while the block is in effect.  3. For blocks in the legs and feet, returning to weight bearing and walking needs to be done carefully. You will need to wait until the numbness is entirely gone  and the strength has returned. You should be able to move your leg and foot normally before you try and bear weight or walk. You will need someone to be with you when you first try to ensure you do not fall and possibly risk injury.  4. Bruising and tenderness at the needle site are common side effects and will resolve in a few days.  5. Persistent numbness or new problems with movement should be communicated to the surgeon or the [facility] [phone] [place] [phone]).

## 2014-11-04 NOTE — Transfer of Care (Signed)
Immediate Anesthesia Transfer of Care Note  Patient: Andrea Montes  Procedure(s) Performed: Procedure(s): LEFT SHOULDER ARTHROSCOPY WITH DEBRIDEMENT, DISTAL CLAVICLE EXCISION, ACROMIOPLASTY, ROTATOR CUFF REPAIR AND BICEP TENODESIS (Left) RESECTION DISTAL CLAVICAL (Left)  Patient Location: PACU  Anesthesia Type:GA combined with regional for post-op pain  Level of Consciousness: sedated and patient cooperative  Airway & Oxygen Therapy: Patient Spontanous Breathing and Patient connected to face mask oxygen  Post-op Assessment: Report given to RN and Post -op Vital signs reviewed and stable  Post vital signs: Reviewed and stable  Last Vitals:  Filed Vitals:   11/04/14 0822  BP:   Pulse: 82  Temp:   Resp: 19    Complications: No apparent anesthesia complications

## 2014-11-04 NOTE — Anesthesia Preprocedure Evaluation (Signed)
Anesthesia Evaluation  Patient identified by MRN, date of birth, ID band Patient awake    Reviewed: Allergy & Precautions, NPO status , Patient's Chart, lab work & pertinent test results  Airway Mallampati: I  TM Distance: >3 FB Neck ROM: Full    Dental  (+) Teeth Intact, Dental Advisory Given   Pulmonary sleep apnea and Continuous Positive Airway Pressure Ventilation ,  breath sounds clear to auscultation        Cardiovascular hypertension, Pt. on medications Rhythm:Regular Rate:Normal     Neuro/Psych    GI/Hepatic GERD-  Medicated and Controlled,  Endo/Other  Morbid obesity  Renal/GU      Musculoskeletal   Abdominal   Peds  Hematology   Anesthesia Other Findings   Reproductive/Obstetrics                             Anesthesia Physical Anesthesia Plan  ASA: III  Anesthesia Plan: General   Post-op Pain Management:    Induction: Intravenous  Airway Management Planned: Oral ETT  Additional Equipment:   Intra-op Plan:   Post-operative Plan: Extubation in OR  Informed Consent: I have reviewed the patients History and Physical, chart, labs and discussed the procedure including the risks, benefits and alternatives for the proposed anesthesia with the patient or authorized representative who has indicated his/her understanding and acceptance.   Dental advisory given  Plan Discussed with: CRNA, Anesthesiologist and Surgeon  Anesthesia Plan Comments:         Anesthesia Quick Evaluation

## 2014-11-04 NOTE — Progress Notes (Signed)
Assisted Dr. Crews with left, ultrasound guided, interscalene  block. Side rails up, monitors on throughout procedure. See vital signs in flow sheet. Tolerated Procedure well. 

## 2014-11-05 ENCOUNTER — Encounter (HOSPITAL_BASED_OUTPATIENT_CLINIC_OR_DEPARTMENT_OTHER): Payer: Self-pay | Admitting: Orthopedic Surgery

## 2014-11-05 NOTE — Op Note (Signed)
NAMECLEON, THOMA NO.:  [id number]  MEDICAL RECORD NO.:  [id number]  LOCATION:                                 FACILITY:  PHYSICIAN:  Ninetta Lights, M.D. DATE OF BIRTH:  [birth year]  DATE OF PROCEDURE:  11/04/2014 DATE OF DISCHARGE:  11/04/2014                              OPERATIVE REPORT   PREOPERATIVE DIAGNOSES:  Left shoulder rotator cuff tear.  Impingement. Distal clavicle osteolysis.  POSTOPERATIVE DIAGNOSES:  Left shoulder rotator cuff tear.  Impingement. Distal clavicle osteolysis, with marked tearing biceps tendon at the origin of the glenoid with circumferential labral tears.  Mild grade 2 and 3 changes in the shoulder on the glenoid.  Functional full-thickness tear supraspinatus tendon, crescent region.  PROCEDURES:  Left shoulder exam under anesthesia, arthroscopy. Debridement of labrum.  Chondroplasty glenohumeral joint.  Debridement and immobilization of rotator cuff.  Release and resection of intraarticular portion of biceps allowing it to recess into the bicipital groove.  Acromioplasty, CA ligament release.  Excision of distal clavicle.  Arthroscopic-assisted rotator cuff repair with FiberWire suture x2, SwiveLock anchors x2.  SURGEON-Deira Shimer:  Ninetta Lights, M.D.  ASSISTANT:  Elmyra Ricks, PA, present throughout the entire case and necessary for timely completion of procedure.  ANESTHESIA:  General.  ESTIMATED BLOOD LOSS:  Minimal.  SPECIMENS:  None.  CULTURES:  None.  COMPLICATIONS:  None.  DRESSING:  Soft compressive, shoulder immobilizer.  DESCRIPTION OF PROCEDURE:  The patient was brought to the operating room, placed on the operating table in supine position.  After adequate anesthesia had been obtained, shoulder examined.  Full motion and stable shoulder.  Placed in a beach-chair position on the shoulder positioner, prepped and draped in usual sterile fashion.  Three portals; anterior, posterior, and lateral.   Arthroscope introduced, shoulder distended and inspected.  Circumferentially, degenerative tearing labrum debrided. Chondroplasty of the glenoid.  Biceps tendon 50% tearing near the attachment of the glenoid.  Intra-articular portion released, resected laterally to recess the bicipital groove.  Full-thickness tear, supraspinatus crescent region debrided above and below and immobilized. Tuberosity roughened.  Cannula redirected subacromially.  Acromioplasty from type 3 to type 1 acromion, releasing CA ligament.  Periarticular spurs and lateral centimeter of clavicle resected.  With a cannula laterally, the cuff was captured with 2 horizontal mattress sutures, firmly anchored down into the tuberosity which had been roughened. Two SwiveLock anchors.  At completion, adequacy of acromioplasty, cuff repair confirmed, viewing from all portals.  Instruments and fluids were removed.  Portals were closed with nylon.  Sterile compressive dressing applied.  Anesthesia reversed.  Brought to the recovery room.  Tolerated the surgery well.  No complications.     Ninetta Lights, M.D.     DFM/MEDQ  D:  11/04/2014  T:  11/05/2014  Job:  [id number]

## 2014-11-17 ENCOUNTER — Ambulatory Visit: Payer: 59 | Attending: Orthopedic Surgery | Admitting: Physical Therapy

## 2014-11-17 DIAGNOSIS — Z4789 Encounter for other orthopedic aftercare: Secondary | ICD-10-CM | POA: Diagnosis present

## 2014-11-17 DIAGNOSIS — M25612 Stiffness of left shoulder, not elsewhere classified: Secondary | ICD-10-CM

## 2014-11-17 DIAGNOSIS — R29898 Other symptoms and signs involving the musculoskeletal system: Secondary | ICD-10-CM | POA: Diagnosis not present

## 2014-11-17 DIAGNOSIS — M25512 Pain in left shoulder: Secondary | ICD-10-CM | POA: Insufficient documentation

## 2014-11-17 DIAGNOSIS — Z9889 Other specified postprocedural states: Secondary | ICD-10-CM

## 2014-11-17 NOTE — Therapy (Signed)
Andrea Di[facility]az [place], [place], [address] Phone: [phone]   Fax:  [phone]  Physical Therapy Evaluation  Patient Details  Name: Andrea Montes MRN: [id number] Date of Birth: 1956/04/03 Referring Provider:  Kathryne Hitch, MD  Encounter Date: 11/17/2014      PT End of Session - 11/17/14 1145    Visit Number 1   Number of Visits 16   Date for PT Re-Evaluation 01/12/15   PT Start Time 0934   PT Stop Time 1020   PT Time Calculation (min) 46 min   Activity Tolerance Patient tolerated treatment well   Behavior During Therapy [facility] for tasks assessed/performed      Past Medical History  Diagnosis Date  . History of blood transfusion   . Vitamin D deficiency   . Migraines   . Anemia     no current med.  . DDD (degenerative disc disease), lumbar   . History of shingles   . GERD (gastroesophageal reflux disease)     no current med.  . 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 Impingement syndrome of left shoulder region 10/2014  . Sleep apnea     uses CPAP nightly  . Wears partial dentures     upper  . Dental crowns present   . Edema of lower extremity     bilateral    Past Surgical History  Procedure Laterality Date  . Rotator cuff repair  2010  . Vaginal hysterectomy  2001    partial  . Shoulder arthroscopy with rotator cuff repair Right 10/04/2008  . Carpal tunnel release Left 04/25/2006  . Colonoscopy with propofol  10/09/2011  . Shoulder arthroscopy with subacromial decompression, rotator cuff repair and bicep tendon repair Left 11/04/2014    Procedure: LEFT SHOULDER ARTHROSCOPY WITH DEBRIDEMENT, DISTAL CLAVICLE EXCISION, ACROMIOPLASTY, ROTATOR CUFF REPAIR AND BICEP TENODESIS;  Surgeon: Kathryne Hitch, MD;  Location: [place];  Service: Orthopedics;  Laterality: Left;  . Resection distal clavical Left 11/04/2014    Procedure: RESECTION DISTAL CLAVICAL;  Surgeon:  Kathryne Hitch, MD;  Location: [place];  Service: Orthopedics;  Laterality: Left;    There were no vitals filed for this visit.  Visit Diagnosis:  S/P rotator cuff repair  Shoulder weakness  Decreased range of motion of left shoulder      Subjective Assessment - 11/17/14 1118    Symptoms Pt. presents amost 2 weeks post rotator cuff repair with pain in L shoulder, mild edema, and limited ROM. Pt is right handed. She is wearing a sling at all times except when showering and during PT sessions. Pt staes that she is independent with most ADLs, but is not driving due to decreaed reaction time. See follows MD's precaustions and avoids lifting; lives with her daughter and 3 grandchildren and can get help with household activities when necessary.       Pertinent History R shoulder rotator cuff repair 6 years ago   Limitations House hold activities;Lifting;Other (comment)  Laying - pain, can lay on her back only in bed or recliner    How long can you sit comfortably? Not limited   How long can you stand comfortably? Not limited   How long can you walk comfortably? Not limited   Diagnostic tests MRI prior to surgery   Patient Stated Goals Wants to get back to doing crafts with her hands   Currently in Pain? Yes   Pain Score 5  Pain Location Shoulder   Pain Orientation Left   Pain Descriptors / Indicators Throbbing   Pain Type Surgical pain   Pain Onset 1 to 4 weeks ago   Pain Frequency Constant   Aggravating Factors  Moving the shoulder   Pain Relieving Factors medication, heat, cold   Effect of Pain on Daily Activities limited in lifting, driving, and other ADLs involving UE elevation   Multiple Pain Sites No          [facility] PT Assessment - 11/17/14 0952    Assessment   Medical Diagnosis rotator cuff repair   Onset Date 11/04/14   Next MD Visit 12/21/14   Prior Therapy None    Precautions   Precautions Shoulder   Type of Shoulder Precautions no lifting     Shoulder Interventions Shoulder sling/immobilizer;At all times   Precaution Booklet Issued No   Precaution Comments Educated pt on importance of following precautions    Restrictions   Weight Bearing Restrictions No   Balance Screen   Has the patient fallen in the past 6 months No   Has the patient had a decrease in activity level because of a fear of falling?  No   Is the patient reluctant to leave their home because of a fear of falling?  No   Prior Function   Level of Independence Independent with basic ADLs;Independent with homemaking with ambulation;Independent with homemaking with wheelchair;Independent with gait;Independent with transfers   Cognition   Overall Cognitive Status Within Functional Limits for tasks assessed   Observation/Other Assessments   Observations No visible abnormalities    Skin Integrity WNL   Sensation   Light Touch Appears Intact   Coordination   Gross Motor Movements are Fluid and Coordinated Yes   Posture/Postural Control   Posture/Postural Control No significant limitations   PROM   Right Shoulder External Rotation 40 Degrees  at slight abduction   Left Shoulder Flexion 50 Degrees   Left Shoulder ABduction 80 Degrees   Left Shoulder Internal Rotation --  [facility]   Palpation   Palpation TTP at and around incision area          [facility] Adult PT Treatment/Exercise - 11/17/14 0952    Shoulder Exercises: ROM/Strengthening   Pendulum standing, 2 minutes          PT Education - 11/17/14 1143    Education provided Yes   Education Details Posture, crytherapy, protecting the incision, HEP (pendulums, elbow and wrist motions), following MD's orders for precautions     Person(s) Educated Patient   Methods Explanation;Demonstration;Verbal cues   Comprehension Verbalized understanding;Returned demonstration          PT Short Term Goals - 11/17/14 1211    PT SHORT TERM GOAL #1   Title Pt will be independent with basic HEP   Time 4   Period Weeks    Status New   PT SHORT TERM GOAL #2   Title Pt will be able to achieve 120 deg abduction and 100 deg flexion passively with min pain.     Time 4   Period Weeks   Status New   PT SHORT TERM GOAL #3   Title Pt's swelling will decrease to bearly visible in order to decrease pain and improve ROM   Time 4   Period Weeks   Status New   PT SHORT TERM GOAL #4   Title Pt's elbow, wrist, and finger motion will continue to be WNL in order to maintain function of these  joints    Time 4   Period Weeks   Status New           PT Long Term Goals - 11/17/14 1219    PT LONG TERM GOAL #1   Title Pt will be independent with advanced HEP   Time 8   Period Weeks   Status New   PT LONG TERM GOAL #2   Title Pt will have shoulder flexion and abduction srength 4/5 in order to start lifting light grocery bags and kitchen pots while cooking    Time 8   Period Weeks   Status New   PT LONG TERM GOAL #3   Title Pt's L shoulder ROM flexion will improve 130 deg and abduction to 140 deg to be able to safely reach to overhead cabinets     PT LONG TERM GOAL #4   Title Pt will be able to activate mm quickly during rhythmic stabilization in order to return to driving safely    Time 8   Period Weeks   Status New           Plan - 11/17/14 1146    Clinical Impression Statement Pt presents s/p rotator cuff repair on 11/04/2014 with pain, limited ROM and weakness of L shoulder. These limitations prevent pt from driving, lifting, and elevating L UE. She needs skilled PT to improve quantity and quality of motion, regain strength, and prevent re-injury in order to go back to functional mobility of L UE for safe household activities, working, and driving.      Pt will benefit from skilled therapeutic intervention in order to improve on the following deficits Decreased range of motion;Impaired flexibility;Increased edema;Decreased endurance;Postural dysfunction;Increased fascial restricitons;Obesity;Pain;Impaired  UE functional use;Decreased scar mobility;Decreased strength   Rehab Potential Good   PT Frequency 2x / week   PT Duration 8 weeks   PT Treatment/Interventions Moist Heat;Cryotherapy;Electrical Stimulation;Neuromuscular re-education;Manual lymph drainage;Dry needling;Manual techniques;Ultrasound;Therapeutic exercise;Passive range of motion;Scar mobilization;Patient/family education;Therapeutic activities;ADLs/Self Care Home Management   PT Next Visit Plan Measure grip strength, continue PROM shoulder, AROM elbow and wrist/fingers, education, and modalities for pain and swelling    PT Home Exercise Plan Pendulums, elbow flexion, grip exercise, cryotherapy, posture   Consulted and Agree with Plan of Care Patient       Problem List Patient Active Problem List   Diagnosis Date Noted  . Low back pain radiating to both legs 05/06/2012  . Leg length discrepancy 05/06/2012  . HYPERTENSION, BENIGN 04/11/2009  . LEG EDEMA, BILATERAL 10/08/2008  . 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 OVARIAN FAILURE 09/02/2007  . VITAMIN D DEFICIENCY 09/02/2007  . OBESITY, NOS 10/17/2006  . ANEMIA, IRON DEFICIENCY, UNSPEC. 10/17/2006  . CARPAL TUNNEL SYNDROME 10/17/2006    Ileana Roup 11/17/2014, 12:31 PM  [facility] [address] [place], [place], [address] Phone: [phone]   Fax:  [phone]

## 2014-11-17 NOTE — Patient Instructions (Addendum)
ROM: Pendulum (Circular)  Let right arm move in circle clockwise, then counterclockwise, by rocking body weight in circular pattern. Circle _10___ times each direction per set. Do _3___ sessions per day.  Pendulum Side to Side  Bend forward 90 at waist, leaning on table for support. Rock body from side to side and let arm swing freely. Repeat _10___ times. Do __3__ sessions per day.  Finger Flexors  Keeping right fingertips straight, press putty toward base of palm. Repeat __20__ times. Do __3__ sessions per day. Activity: Squeeze flour sifter, plastic squeeze bottles, [place] baster, juice from fruit.*  AROM: Elbow Flexion / Extension  With left hand palm up, gently bend elbow as far as possible. Then straighten arm as far as possible. Repeat __10__ times per set. Do __3__ sessions per day.  SHOULDER: Flexion On Table  Place hands on table, elbows straight. Move hips away from body. Press hands down into table. Hold _3__ seconds. _10__ reps per set, __3_ sets per day. Posture - Sitting   Sit upright, head facing forward. Try using a roll to support lower back. Keep shoulders relaxed, and avoid rounded back. Keep hips level with knees. Avoid crossing legs for long periods.  Copyright  VHI. All rights reserved.   Cryotherapy  ICE 20 mins at most, 3 times a day following exercises.   Cryotherapy means treatment with cold. Ice or gel packs can be used to reduce both pain and swelling. Ice is the most helpful within the first 24 to 48 hours after an injury or flare-up from overusing a muscle or joint. Sprains, strains, spasms, burning pain, shooting pain, and aches can all be eased with ice. Ice can also be used when recovering from surgery. Ice is effective, has very few side effects, and is safe for most people to use. PRECAUTIONS  Ice is not a safe treatment option for people with:  Raynaud phenomenon. This is a condition affecting small blood vessels in the extremities.  Exposure to cold may cause your problems to return.  Cold hypersensitivity. There are many forms of cold hypersensitivity, including:  Cold urticaria. Red, itchy hives appear on the skin when the tissues begin to warm after being iced.  Cold erythema. This is a red, itchy rash caused by exposure to cold.  Cold hemoglobinuria. Red blood cells break down when the tissues begin to warm after being iced. The hemoglobin that carry oxygen are passed into the urine because they cannot combine with blood proteins fast enough.  Numbness or altered sensitivity in the area being iced. If you have any of the following conditions, do not use ice until you have discussed cryotherapy with your caregiver:  Heart conditions, such as arrhythmia, angina, or chronic heart disease.  High blood pressure.  Healing wounds or open skin in the area being iced.  Current infections.  Rheumatoid arthritis.  Poor circulation.  Diabetes. Ice slows the blood flow in the region it is applied. This is beneficial when trying to stop inflamed tissues from spreading irritating chemicals to surrounding tissues. However, if you expose your skin to cold temperatures for too long or without the proper protection, you can damage your skin or nerves. Watch for signs of skin damage due to cold. HOME CARE INSTRUCTIONS Follow these tips to use ice and cold packs safely.  Place a dry or damp towel between the ice and skin. A damp towel will cool the skin more quickly, so you may need to shorten the time that the ice is  used.  For a more rapid response, add gentle compression to the ice.  Ice for no more than 20 minutes at a time. The bonier the area you are icing, the less time it will take to get the benefits of ice.  Check your skin after 5 minutes to make sure there are no signs of a poor response to cold or skin damage.  Rest 20 minutes or more between uses.  Once your skin is numb, you can end your treatment. You can  test numbness by very lightly touching your skin. The touch should be so light that you do not see the skin dimple from the pressure of your fingertip. When using ice, most people will feel these normal sensations in this order: cold, burning, aching, and numbness.  Do not use ice on someone who cannot communicate their responses to pain, such as small children or people with dementia. HOW TO MAKE AN ICE PACK Ice packs are the most common way to use ice therapy. Other methods include ice massage, ice baths, and cryosprays. Muscle creams that cause a cold, tingly feeling do not offer the same benefits that ice offers and should not be used as a substitute unless recommended by your caregiver. To make an ice pack, do one of the following:  Place crushed ice or a bag of frozen vegetables in a sealable plastic bag. Squeeze out the excess air. Place this bag inside another plastic bag. Slide the bag into a pillowcase or place a damp towel between your skin and the bag.  Mix 3 parts water with 1 part rubbing alcohol. Freeze the mixture in a sealable plastic bag. When you remove the mixture from the freezer, it will be slushy. Squeeze out the excess air. Place this bag inside another plastic bag. Slide the bag into a pillowcase or place a damp towel between your skin and the bag. SEEK MEDICAL CARE IF:  You develop white spots on your skin. This may give the skin a blotchy (mottled) appearance.  Your skin turns blue or pale.  Your skin becomes waxy or hard.  Your swelling gets worse. MAKE SURE YOU:   Understand these instructions.  Will watch your condition.  Will get help right away if you are not doing well or get worse. Document Released: 04/02/2011 Document Revised: 12/21/2013 Document Reviewed: 04/02/2011 [facility] Patient Information 2015 [place], [place]. This information is not intended to replace advice given to you by your health care provider. Make sure you discuss any questions you have  with your health care provider.

## 2014-11-30 ENCOUNTER — Ambulatory Visit: Payer: 59 | Attending: Orthopedic Surgery | Admitting: Physical Therapy

## 2014-11-30 DIAGNOSIS — M25612 Stiffness of left shoulder, not elsewhere classified: Secondary | ICD-10-CM

## 2014-11-30 DIAGNOSIS — R29898 Other symptoms and signs involving the musculoskeletal system: Secondary | ICD-10-CM | POA: Insufficient documentation

## 2014-11-30 DIAGNOSIS — Z9889 Other specified postprocedural states: Secondary | ICD-10-CM

## 2014-11-30 DIAGNOSIS — Z4789 Encounter for other orthopedic aftercare: Secondary | ICD-10-CM | POA: Insufficient documentation

## 2014-11-30 DIAGNOSIS — M25512 Pain in left shoulder: Secondary | ICD-10-CM | POA: Diagnosis not present

## 2014-11-30 NOTE — Therapy (Signed)
[place], [place], [address] Phone: [phone]   Fax:  [phone]  Physical Therapy Treatment  Patient Details  Name: Andrea Montes MRN: [id number] Date of Birth: 1956/03/25 Referring Provider:  Seward Carol, MD  Encounter Date: 11/30/2014      PT End of Session - 11/30/14 1331    Visit Number 2   Number of Visits 16   Date for PT Re-Evaluation 01/12/15   PT Start Time 0630   PT Stop Time 0950   PT Time Calculation (min) 63 min   Activity Tolerance Patient tolerated treatment well;No increased pain   Behavior During Therapy [facility] for tasks assessed/performed      Past Medical History  Diagnosis Date  . History of blood transfusion   . Vitamin D deficiency   . Migraines   . Anemia     no current med.  . DDD (degenerative disc disease), lumbar   . History of shingles   . GERD (gastroesophageal reflux disease)     no current med.  . 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 Impingement syndrome of left shoulder region 10/2014  . Sleep apnea     uses CPAP nightly  . Wears partial dentures     upper  . Dental crowns present   . Edema of lower extremity     bilateral    Past Surgical History  Procedure Laterality Date  . Rotator cuff repair  2010  . Vaginal hysterectomy  2001    partial  . Shoulder arthroscopy with rotator cuff repair Right 10/04/2008  . Carpal tunnel release Left 04/25/2006  . Colonoscopy with propofol  10/09/2011  . Shoulder arthroscopy with subacromial decompression, rotator cuff repair and bicep tendon repair Left 11/04/2014    Procedure: LEFT SHOULDER ARTHROSCOPY WITH DEBRIDEMENT, DISTAL CLAVICLE EXCISION, ACROMIOPLASTY, ROTATOR CUFF REPAIR AND BICEP TENODESIS;  Surgeon: Kathryne Hitch, MD;  Location: [place];  Service: Orthopedics;  Laterality: Left;  . Resection distal clavical Left 11/04/2014    Procedure: RESECTION DISTAL  CLAVICAL;  Surgeon: Kathryne Hitch, MD;  Location: [place];  Service: Orthopedics;  Laterality: Left;    There were no vitals filed for this visit.  Visit Diagnosis:  S/P rotator cuff repair  Shoulder weakness  Decreased range of motion of left shoulder      Subjective Assessment - 11/30/14 1318    Subjective pt reports less pain today, did not take her medication before therapy because it makes her drowsy. Sling irritates her skin on the medial side of the elbow and contralateral neck. States that she is compliant with HEP.    Currently in Pain? Yes   Pain Score 4    Pain Location Shoulder   Pain Orientation Left   Pain Descriptors / Indicators Throbbing   Pain Type Surgical pain   Pain Frequency Intermittent   Multiple Pain Sites No          [facility] PT Assessment - 11/30/14 0915    Assessment   Next MD Visit 12/21/14   Home Environment   Living Enviornment Private residence   Observation/Other Assessments   Observations Grip strength R 45 lbs, L 35 lbs   PROM   Right Shoulder External Rotation 66 Degrees   Left Shoulder Flexion 120 Degrees  post tx   Left Shoulder ABduction 85 Degrees  post tx          [facility] Adult PT Treatment/Exercise - 11/30/14  0819    Shoulder Exercises: Standing   External Rotation --   Theraband Level (Shoulder External Rotation) --   Flexion --   Extension --   Retraction --   Shoulder Exercises: ROM/Strengthening   Pendulum standing and sitting, 3 minutes   Manual Therapy   Joint Mobilization GH AP mobs grades 2-3 for imp flexion, inf glide for abd    Passive ROM with distraction into all planes          PT Education - 11/30/14 1330    Education provided Yes   Education Details Pendulums, pain management, positioning    Person(s) Educated Patient   Methods Explanation;Demonstration;Tactile cues;Verbal cues   Comprehension Verbalized understanding;Returned demonstration          PT Short Term Goals -  11/30/14 1358    PT SHORT TERM GOAL #1   Title Pt will be independent with basic HEP   Status On-going   PT SHORT TERM GOAL #2   Title Pt will be able to achieve 120 deg scaption passively with min pain.     Status Achieved   PT SHORT TERM GOAL #3   Title Pt's swelling will decrease to bearly visible in order to decrease pain and improve ROM   Status On-going   PT SHORT TERM GOAL #4   Title Maintain elbow and wrist ROM and set goal for grip strength   Status On-going           PT Long Term Goals - 11/30/14 1359    PT LONG TERM GOAL #1   Title Pt will be independent with advanced HEP   Status On-going   PT LONG TERM GOAL #2   Title Pt will have shoulder flexion and abduction srength 4/5 in order to start lifting light grocery bags and kitchen pots while cooking    Status On-going   PT LONG TERM GOAL #3   Title Pt's L shoulder AROM elevation to 130 deg to be able to safely reach to overhead cabinets     Status On-going   PT LONG TERM GOAL #4   Title Pt will be able to activate mm quickly during rhythmic stabilization in order to return to driving safely    Status On-going   PT LONG TERM GOAL #5   Title Grip strength will improve to __43lb more_ for improved lifting, carrying objects.    Status New          Plan - 11/30/14 1332    Clinical Impression Statement Pt has difficulties relaxing her L UE. Had minimal pain today during PROM and mobilizations. Pain at end range ER, Flexion, and ABD. Measured grip strength today.     PT Next Visit Plan continue PROM shoulder, AROM elbow and wrist/fingers, education, and modalities for pain and swelling. Add soft tissue.   PT Home Exercise Plan Pendulums, elbow flexion, grip exercise, cryotherapy, posture   Consulted and Agree with Plan of Care Patient     Problem List Patient Active Problem List   Diagnosis Date Noted  . Low back pain radiating to both legs 05/06/2012  . Leg length discrepancy 05/06/2012  . HYPERTENSION, BENIGN  04/11/2009  . LEG EDEMA, BILATERAL 10/08/2008  . 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 OVARIAN FAILURE 09/02/2007  . VITAMIN D DEFICIENCY 09/02/2007  . OBESITY, NOS 10/17/2006  . ANEMIA, IRON DEFICIENCY, UNSPEC. 10/17/2006  . CARPAL TUNNEL SYNDROME 10/17/2006    PAA,JENNIFER 11/30/2014, 2:00 PM  [facility]  [place], [place], [address] Phone: [phone]   Fax:  [phone]

## 2014-12-01 ENCOUNTER — Ambulatory Visit: Payer: 59 | Admitting: Physical Therapy

## 2014-12-01 DIAGNOSIS — Z9889 Other specified postprocedural states: Secondary | ICD-10-CM

## 2014-12-01 DIAGNOSIS — M25612 Stiffness of left shoulder, not elsewhere classified: Secondary | ICD-10-CM

## 2014-12-01 DIAGNOSIS — Z4789 Encounter for other orthopedic aftercare: Secondary | ICD-10-CM | POA: Diagnosis not present

## 2014-12-01 DIAGNOSIS — R29898 Other symptoms and signs involving the musculoskeletal system: Secondary | ICD-10-CM

## 2014-12-01 NOTE — Therapy (Signed)
[place], [place], [address] Phone: [phone]   Fax:  [phone]  Physical Therapy Treatment  Patient Details  Name: Andrea Montes MRN: [id number] Date of Birth: November 14, 1955 Referring Provider:  Seward Carol, MD  Encounter Date: 12/01/2014      PT End of Session - 12/01/14 0915    Visit Number 3   Number of Visits 16   Date for PT Re-Evaluation 01/12/15   PT Start Time 0845   PT Stop Time 0945   PT Time Calculation (min) 60 min      Past Medical History  Diagnosis Date  . History of blood transfusion   . Vitamin D deficiency   . Migraines   . Anemia     no current med.  . DDD (degenerative disc disease), lumbar   . History of shingles   . GERD (gastroesophageal reflux disease)     no current med.  . 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 Impingement syndrome of left shoulder region 10/2014  . Sleep apnea     uses CPAP nightly  . Wears partial dentures     upper  . Dental crowns present   . Edema of lower extremity     bilateral    Past Surgical History  Procedure Laterality Date  . Rotator cuff repair  2010  . Vaginal hysterectomy  2001    partial  . Shoulder arthroscopy with rotator cuff repair Right 10/04/2008  . Carpal tunnel release Left 04/25/2006  . Colonoscopy with propofol  10/09/2011  . Shoulder arthroscopy with subacromial decompression, rotator cuff repair and bicep tendon repair Left 11/04/2014    Procedure: LEFT SHOULDER ARTHROSCOPY WITH DEBRIDEMENT, DISTAL CLAVICLE EXCISION, ACROMIOPLASTY, ROTATOR CUFF REPAIR AND BICEP TENODESIS;  Surgeon: Kathryne Hitch, MD;  Location: [place];  Service: Orthopedics;  Laterality: Left;  . Resection distal clavical Left 11/04/2014    Procedure: RESECTION DISTAL CLAVICAL;  Surgeon: Kathryne Hitch, MD;  Location: [place];  Service: Orthopedics;  Laterality: Left;    There were no  vitals filed for this visit.  Visit Diagnosis:  S/P rotator cuff repair  Shoulder weakness  Decreased range of motion of left shoulder      Subjective Assessment - 12/01/14 0846    Subjective No pain today. Was very sore after yesterday.    Currently in Pain? No/denies            [facility] PT Assessment - 11/30/14 0915    Assessment   Next MD Visit 12/21/14   Home Environment   Living Enviornment Private residence   Observation/Other Assessments   Observations Grip strength R 45 lbs, L 35 lbs   PROM   Right Shoulder External Rotation 66 Degrees   Left Shoulder Flexion 120 Degrees  post tx   Left Shoulder ABduction 85 Degrees  post tx          [facility] Adult PT Treatment/Exercise - 12/01/14 0857    Shoulder Exercises: Supine   Other Supine Exercises Scap retraction 2 sets x 10    Other Supine Exercises elevation x 20    Shoulder Exercises: Prone   Other Prone Exercises prone scapular protraction x10    Cryotherapy   Number Minutes Cryotherapy 15 Minutes   Cryotherapy Location Shoulder   Type of Cryotherapy Other (comment)  Vaso   Manual Therapy   Joint Mobilization GH A/P, Gr 1-2 inf glides, PROm all planes, joint distraction/oscillation  Passive ROM all planes in supine and prone      Sidelying PNF for scapular protraction and retraction, elevation and depression (guided resistance) Gentle table slide into Forward flexion        PT Education - 12/01/14 0914    Education provided Yes   Education Details trigger points   Person(s) Educated Patient   Methods Explanation   Comprehension Verbalized understanding          PT Short Term Goals - 12/01/14 0916    PT SHORT TERM GOAL #1   Title Pt will be independent with basic HEP   Status On-going   PT SHORT TERM GOAL #2   Title Pt will be able to achieve 120 deg scaption passively with min pain.     Status On-going   PT SHORT TERM GOAL #3   Title Pt's swelling will decrease to bearly visible in order to  decrease pain and improve ROM   Status On-going   PT SHORT TERM GOAL #4   Title Maintain elbow and wrist ROM and set goal for grip strength   Status On-going           PT Long Term Goals - 12/01/14 0920    PT LONG TERM GOAL #1   Title Pt will be independent with advanced HEP   Status On-going   PT LONG TERM GOAL #2   Title Pt will have shoulder flexion and abduction srength 4/5 in order to start lifting light grocery bags and kitchen pots while cooking    Status On-going   PT LONG TERM GOAL #3   Title Pt's L shoulder AROM elevation to 130 deg to be able to safely reach to overhead cabinets     Status On-going   PT LONG TERM GOAL #4   Title Pt will be able to activate mm quickly during rhythmic stabilization in order to return to driving safely    Status On-going   PT LONG TERM GOAL #5   Title Grip strength will improve to __43lb more_ for improved lifting, carrying objects.    Status On-going               Plan - 12/01/14 0915    Clinical Impression Statement Pain and muscle guarding during manual PT.          Problem List Patient Active Problem List   Diagnosis Date Noted  . Low back pain radiating to both legs 05/06/2012  . Leg length discrepancy 05/06/2012  . HYPERTENSION, BENIGN 04/11/2009  . LEG EDEMA, BILATERAL 10/08/2008  . 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 OVARIAN FAILURE 09/02/2007  . VITAMIN D DEFICIENCY 09/02/2007  . OBESITY, NOS 10/17/2006  . ANEMIA, IRON DEFICIENCY, UNSPEC. 10/17/2006  . CARPAL TUNNEL SYNDROME 10/17/2006    Reggie Bise 12/01/2014, 9:25 AM  [facility] [address] [place], [place], [address] Phone: [phone]   Fax:  [phone]

## 2014-12-06 ENCOUNTER — Ambulatory Visit: Payer: 59 | Admitting: Physical Therapy

## 2014-12-06 DIAGNOSIS — Z4789 Encounter for other orthopedic aftercare: Secondary | ICD-10-CM | POA: Diagnosis not present

## 2014-12-06 DIAGNOSIS — Z9889 Other specified postprocedural states: Secondary | ICD-10-CM

## 2014-12-06 DIAGNOSIS — M25612 Stiffness of left shoulder, not elsewhere classified: Secondary | ICD-10-CM

## 2014-12-06 DIAGNOSIS — R29898 Other symptoms and signs involving the musculoskeletal system: Secondary | ICD-10-CM

## 2014-12-06 NOTE — Therapy (Signed)
[place], [place], [address] Phone: [phone]   Fax:  [phone]  Physical Therapy Treatment  Patient Details  Name: Andrea Montes MRN: [id number] Date of Birth: [birth year] Referring Provider:  Seward Carol, MD  Encounter Date: 12/06/2014      PT End of Session - 12/06/14 0958    Visit Number 4   Number of Visits 16   Date for PT Re-Evaluation 01/12/15   PT Start Time 0932   PT Stop Time 1030   PT Time Calculation (min) 58 min      Past Medical History  Diagnosis Date  . History of blood transfusion   . Vitamin D deficiency   . Migraines   . Anemia     no current med.  . DDD (degenerative disc disease), lumbar   . History of shingles   . GERD (gastroesophageal reflux disease)     no current med.  . 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 Impingement syndrome of left shoulder region 10/2014  . Sleep apnea     uses CPAP nightly  . Wears partial dentures     upper  . Dental crowns present   . Edema of lower extremity     bilateral    Past Surgical History  Procedure Laterality Date  . Rotator cuff repair  2010  . Vaginal hysterectomy  2001    partial  . Shoulder arthroscopy with rotator cuff repair Right 10/04/2008  . Carpal tunnel release Left 04/25/2006  . Colonoscopy with propofol  10/09/2011  . Shoulder arthroscopy with subacromial decompression, rotator cuff repair and bicep tendon repair Left 11/04/2014    Procedure: LEFT SHOULDER ARTHROSCOPY WITH DEBRIDEMENT, DISTAL CLAVICLE EXCISION, ACROMIOPLASTY, ROTATOR CUFF REPAIR AND BICEP TENODESIS;  Surgeon: Kathryne Hitch, MD;  Location: [place];  Service: Orthopedics;  Laterality: Left;  . Resection distal clavical Left 11/04/2014    Procedure: RESECTION DISTAL CLAVICAL;  Surgeon: Kathryne Hitch, MD;  Location: [place];  Service: Orthopedics;  Laterality: Left;    There were no  vitals filed for this visit.  Visit Diagnosis:  S/P rotator cuff repair  Shoulder weakness  Decreased range of motion of left shoulder      Subjective Assessment - 12/06/14 0936    Subjective Alot of pain this weekend.    Pain Score 4    Pain Descriptors / Indicators Throbbing   Pain Type Surgical pain   Aggravating Factors  movement   Pain Relieving Factors sling            [facility] PT Assessment - 12/06/14 1025    PROM   Right Shoulder External Rotation 30 Degrees  pain   Left Shoulder Flexion 120 Degrees   Left Shoulder ABduction 90 Degrees   Left Shoulder Internal Rotation 50 Degrees                   [facility] Adult PT Treatment/Exercise - 12/06/14 1018    Elbow Exercises   Elbow Flexion AROM;Left;20 reps  supine   Shoulder Exercises: Supine   External Rotation AAROM   External Rotation Limitations cane exercise x 10 with 10 sec hold  cues for positioning and gentle stretch   Shoulder Exercises: Seated   Flexion AAROM   Flexion Limitations table slides flexion x 10, scaption x 10, abduction x 3 (painful)   cues for gentle stretch and 30 sec hold time   Modalities   Modalities  Electrical Stimulation   Cryotherapy   Number Minutes Cryotherapy 15 Minutes   Cryotherapy Location Shoulder   Type of Cryotherapy Other (comment)  vaso low pressure 2 snowflakes x 17min   Manual Therapy   Passive ROM all planes in supine                  PT Short Term Goals - 12/01/14 0916    PT SHORT TERM GOAL #1   Title Pt will be independent with basic HEP   Status On-going   PT SHORT TERM GOAL #2   Title Pt will be able to achieve 120 deg scaption passively with min pain.     Status On-going   PT SHORT TERM GOAL #3   Title Pt's swelling will decrease to bearly visible in order to decrease pain and improve ROM   Status On-going   PT SHORT TERM GOAL #4   Title Maintain elbow and wrist ROM and set goal for grip strength   Status On-going           PT  Long Term Goals - 12/01/14 0920    PT LONG TERM GOAL #1   Title Pt will be independent with advanced HEP   Status On-going   PT LONG TERM GOAL #2   Title Pt will have shoulder flexion and abduction srength 4/5 in order to start lifting light grocery bags and kitchen pots while cooking    Status On-going   PT LONG TERM GOAL #3   Title Pt's L shoulder AROM elevation to 130 deg to be able to safely reach to overhead cabinets     Status On-going   PT LONG TERM GOAL #4   Title Pt will be able to activate mm quickly during rhythmic stabilization in order to return to driving safely    Status On-going   PT LONG TERM GOAL #5   Title Grip strength will improve to __43lb more_ for improved lifting, carrying objects.    Status On-going               Plan - 12/06/14 1026    Clinical Impression Statement Pt reports increased pain beginning Thursday of last week from unknown cause, She reports almost going to the ER Saturday due to pain. Her f/u with MD is May 3rd. She does not display warmth or redness in her shoulder. She may call her MD and try to be seen sooner if pain persists.  Pt requires max cues to relax during PROM and pain is controlled if patient remains unguarded.     PT Next Visit Plan continue PROM shoulder, AROM elbow and wrist/fingers, education, and modalities for pain and swelling. Add soft tissue. check protocol and advance as tolerated        Problem List Patient Active Problem List   Diagnosis Date Noted  . Low back pain radiating to both legs 05/06/2012  . Leg length discrepancy 05/06/2012  . HYPERTENSION, BENIGN 04/11/2009  . LEG EDEMA, BILATERAL 10/08/2008  . 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 OVARIAN FAILURE 09/02/2007  . VITAMIN D DEFICIENCY 09/02/2007  . OBESITY, NOS 10/17/2006  . ANEMIA, IRON DEFICIENCY, UNSPEC. 10/17/2006  . CARPAL TUNNEL SYNDROME 10/17/2006    Dorene Ar, PTA 12/06/2014, 10:51 AM  [facility] [address] [place], [place], [address] Phone: [phone]   Fax:  [phone]

## 2014-12-08 ENCOUNTER — Ambulatory Visit: Payer: 59 | Admitting: Physical Therapy

## 2014-12-08 DIAGNOSIS — Z4789 Encounter for other orthopedic aftercare: Secondary | ICD-10-CM | POA: Diagnosis not present

## 2014-12-08 DIAGNOSIS — R29898 Other symptoms and signs involving the musculoskeletal system: Secondary | ICD-10-CM

## 2014-12-08 DIAGNOSIS — Z9889 Other specified postprocedural states: Secondary | ICD-10-CM

## 2014-12-08 DIAGNOSIS — M25612 Stiffness of left shoulder, not elsewhere classified: Secondary | ICD-10-CM

## 2014-12-08 NOTE — Therapy (Signed)
[place], [place], [address] Phone: [phone]   Fax:  [phone]  Physical Therapy Treatment  Patient Details  Name: Andrea Montes MRN: [id number] Date of Birth: [birth year] Referring Provider:  Seward Carol, MD  Encounter Date: 12/08/2014      PT End of Session - 12/08/14 0849    Visit Number 5   Number of Visits 16   Date for PT Re-Evaluation 01/12/15   PT Start Time 0848   PT Stop Time 0948   PT Time Calculation (min) 60 min      Past Medical History  Diagnosis Date  . History of blood transfusion   . Vitamin D deficiency   . Migraines   . Anemia     no current med.  . DDD (degenerative disc disease), lumbar   . History of shingles   . GERD (gastroesophageal reflux disease)     no current med.  . 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 Impingement syndrome of left shoulder region 10/2014  . Sleep apnea     uses CPAP nightly  . Wears partial dentures     upper  . Dental crowns present   . Edema of lower extremity     bilateral    Past Surgical History  Procedure Laterality Date  . Rotator cuff repair  2010  . Vaginal hysterectomy  2001    partial  . Shoulder arthroscopy with rotator cuff repair Right 10/04/2008  . Carpal tunnel release Left 04/25/2006  . Colonoscopy with propofol  10/09/2011  . Shoulder arthroscopy with subacromial decompression, rotator cuff repair and bicep tendon repair Left 11/04/2014    Procedure: LEFT SHOULDER ARTHROSCOPY WITH DEBRIDEMENT, DISTAL CLAVICLE EXCISION, ACROMIOPLASTY, ROTATOR CUFF REPAIR AND BICEP TENODESIS;  Surgeon: Kathryne Hitch, MD;  Location: [place];  Service: Orthopedics;  Laterality: Left;  . Resection distal clavical Left 11/04/2014    Procedure: RESECTION DISTAL CLAVICAL;  Surgeon: Kathryne Hitch, MD;  Location: [place];  Service: Orthopedics;  Laterality: Left;    There were no  vitals filed for this visit.  Visit Diagnosis:  S/P rotator cuff repair  Shoulder weakness  Decreased range of motion of left shoulder      Subjective Assessment - 12/08/14 0849    Subjective The swelling has gone down.  I was sore after last treatment but it wasn't any more than normal.     Currently in Pain? Yes   Pain Score 3    Pain Location Shoulder   Pain Descriptors / Indicators Sore                         [facility] Adult PT Treatment/Exercise - 12/08/14 0908    Shoulder Exercises: Supine   External Rotation Limitations cane exercise x 10 with 10 sec hold  cues for positioning and gentle stretch   Shoulder Exercises: Seated   Flexion Limitations table slides flexion x 10, scaption x 10, no abduction due to significant amount of pain  cues for gentle stretch and 30 sec hold time   Other Seated Exercises shoulder squeezes x 10, 3 sec hold   Shoulder Exercises: Isometric Strengthening   Extension 5X10"  standing with towel on wall   Internal Rotation 5X10"   Internal Rotation Limitations standing with towel on wall   Modalities   Modalities Electrical Stimulation   Cryotherapy   Number Minutes Cryotherapy 15 Minutes  Cryotherapy Location Shoulder   Type of Cryotherapy Other (comment)  vaso low pressure 2 snowflakes x 15 mins   Manual Therapy   Passive ROM all planes in supine                PT Education - 12/08/14 0948    Education provided Yes   Education Details shoulder isometric strengthening extension and internal rotation HEP   Person(s) Educated Patient   Methods Explanation;Demonstration;Tactile cues;Verbal cues;Handout   Comprehension Verbalized understanding;Returned demonstration;Verbal cues required;Tactile cues required          PT Short Term Goals - 12/01/14 0916    PT SHORT TERM GOAL #1   Title Pt will be independent with basic HEP   Status On-going   PT SHORT TERM GOAL #2   Title Pt will be able to achieve 120 deg  scaption passively with min pain.     Status On-going   PT SHORT TERM GOAL #3   Title Pt's swelling will decrease to bearly visible in order to decrease pain and improve ROM   Status On-going   PT SHORT TERM GOAL #4   Title Maintain elbow and wrist ROM and set goal for grip strength   Status On-going           PT Long Term Goals - 12/01/14 0920    PT LONG TERM GOAL #1   Title Pt will be independent with advanced HEP   Status On-going   PT LONG TERM GOAL #2   Title Pt will have shoulder flexion and abduction srength 4/5 in order to start lifting light grocery bags and kitchen pots while cooking    Status On-going   PT LONG TERM GOAL #3   Title Pt's L shoulder AROM elevation to 130 deg to be able to safely reach to overhead cabinets     Status On-going   PT LONG TERM GOAL #4   Title Pt will be able to activate mm quickly during rhythmic stabilization in order to return to driving safely    Status On-going   PT LONG TERM GOAL #5   Title Grip strength will improve to __43lb more_ for improved lifting, carrying objects.    Status On-going               Plan - 12/08/14 0950    Clinical Impression Statement Pt. required cues to relax due to muscle guarding during PROM, but seemed to go a little farther than last treatment.  She said she has been sore but it's no more than normal.  The pt. was able to progress to shoulder squeezes and isometric shoulder extension and internal rotation strengthening.  She was not able to perform scaption table slides due to significant amount of pain in that position.     PT Next Visit Plan continue PROM shoulder, AROM elbow and wrist/fingers, education, and modalities for pain and swelling. add soft tissue. check protocol and advance as tolerated, continue isometric strengthening        Problem List Patient Active Problem List   Diagnosis Date Noted  . Low back pain radiating to both legs 05/06/2012  . Leg length discrepancy 05/06/2012  .  HYPERTENSION, BENIGN 04/11/2009  . LEG EDEMA, BILATERAL 10/08/2008  . 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 OVARIAN FAILURE 09/02/2007  . VITAMIN D DEFICIENCY 09/02/2007  . OBESITY, NOS 10/17/2006  . ANEMIA, IRON DEFICIENCY, UNSPEC. 10/17/2006  . CARPAL TUNNEL SYNDROME 10/17/2006    Glenden Rossell,[place], SPTA 12/08/2014,  9:53 AM  [facility] [address] [place], [place], [address] Phone: [phone]   Fax:  [phone]

## 2014-12-08 NOTE — Patient Instructions (Signed)
shStrengthening: Isometric Extension   Using wall for resistance, press back of left arm into towel using light pressure. Hold __5__ seconds. Repeat _10___ times per set. Do _2___ sets per session. Do __2__ sessions per day.  http://orth.exer.us/805   Copyright  VHI. All rights reserved.   Strengthening: Isometric Internal Rotation   Using door frame for resistance, press palm of right hand into towel using light pressure. Keep elbow in at side. Hold __5__ seconds. Repeat _10___ times per set. Do __2__ sets per session. Do _2___ sessions per day.  http://orth.exer.us/817   Copyright  VHI. All rights reserved.

## 2014-12-13 ENCOUNTER — Ambulatory Visit: Payer: 59 | Admitting: Physical Therapy

## 2014-12-13 DIAGNOSIS — Z9889 Other specified postprocedural states: Secondary | ICD-10-CM

## 2014-12-13 DIAGNOSIS — M25612 Stiffness of left shoulder, not elsewhere classified: Secondary | ICD-10-CM

## 2014-12-13 DIAGNOSIS — R29898 Other symptoms and signs involving the musculoskeletal system: Secondary | ICD-10-CM

## 2014-12-13 DIAGNOSIS — Z4789 Encounter for other orthopedic aftercare: Secondary | ICD-10-CM | POA: Diagnosis not present

## 2014-12-13 NOTE — Therapy (Signed)
[place], [place], [address] Phone: [phone]   Fax:  [phone]  Physical Therapy Treatment  Patient Details  Name: Andrea Montes MRN: [id number] Date of Birth: [birth year] Referring Provider:  Seward Carol, MD  Encounter Date: 12/13/2014      PT End of Session - 12/13/14 0845    Visit Number 6   Number of Visits 16   Date for PT Re-Evaluation 01/12/15   PT Start Time 0845   PT Stop Time 0947   PT Time Calculation (min) 62 min      Past Medical History  Diagnosis Date  . History of blood transfusion   . Vitamin D deficiency   . Migraines   . Anemia     no current med.  . DDD (degenerative disc disease), lumbar   . History of shingles   . GERD (gastroesophageal reflux disease)     no current med.  . 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 Impingement syndrome of left shoulder region 10/2014  . Sleep apnea     uses CPAP nightly  . Wears partial dentures     upper  . Dental crowns present   . Edema of lower extremity     bilateral    Past Surgical History  Procedure Laterality Date  . Rotator cuff repair  2010  . Vaginal hysterectomy  2001    partial  . Shoulder arthroscopy with rotator cuff repair Right 10/04/2008  . Carpal tunnel release Left 04/25/2006  . Colonoscopy with propofol  10/09/2011  . Shoulder arthroscopy with subacromial decompression, rotator cuff repair and bicep tendon repair Left 11/04/2014    Procedure: LEFT SHOULDER ARTHROSCOPY WITH DEBRIDEMENT, DISTAL CLAVICLE EXCISION, ACROMIOPLASTY, ROTATOR CUFF REPAIR AND BICEP TENODESIS;  Surgeon: Kathryne Hitch, MD;  Location: [facility];  Service: Orthopedics;  Laterality: Left;  . Resection distal clavical Left 11/04/2014    Procedure: RESECTION DISTAL CLAVICAL;  Surgeon: Kathryne Hitch, MD;  Location: [facility];  Service: Orthopedics;  Laterality: Left;    There were no  vitals filed for this visit.  Visit Diagnosis:  S/P rotator cuff repair  Shoulder weakness  Decreased range of motion of left shoulder      Subjective Assessment - 12/13/14 0859    Subjective the pt. reports not feeling the "catching" feeling at the end range of PROM.  She says she has been doing her isometric exercises at home and has been able to do more and hold them for longer periods of time.  2/10 pain before and after treatment.     Currently in Pain? Yes   Pain Score 2             [facility] PT Assessment - 12/13/14 0851    PROM   Right Shoulder External Rotation 46 Degrees   Left Shoulder Flexion 125 Degrees   Left Shoulder ABduction 110 Degrees   Left Shoulder Internal Rotation 55 Degrees                     [facility] Adult PT Treatment/Exercise - 12/13/14 0908    Shoulder Exercises: Supine   Other Supine Exercises cane exercise external rotation, flexion, punches 5 sec hold x 15   Shoulder Exercises: Seated   Flexion Limitations table slides flexion x 10  cues for gentle stretch and 30 sec hold time   Other Seated Exercises pulleys flexion x 2 mins   Shoulder Exercises:  Standing   Other Standing Exercises shoulder squeezes 5 sec hold x 20   Shoulder Exercises: Isometric Strengthening   Flexion Other (comment)  10 sec hold x 8   Extension Other (comment)  10 sec hold x 10   External Rotation Other (comment)  10 sec hold x 8   External Rotation Limitations c/o pain; cues for proper technique   Internal Rotation Other (comment)  10 sec hold x 10   ABduction Other (comment)  10 sec hold x 5   ABduction Limitations c/o pain and fatique    Shoulder Exercises: Stretch   External Rotation Stretch 3 reps;30 seconds  sitting - table stretch   Modalities   Modalities Electrical Stimulation   Cryotherapy   Number Minutes Cryotherapy 15 Minutes   Cryotherapy Location Shoulder   Type of Cryotherapy Ice pack   Electrical Stimulation   Electrical Stimulation  Location left shoulder   Electrical Stimulation Action IFC   Electrical Stimulation Parameters level 11   Electrical Stimulation Goals Pain   Manual Therapy   Passive ROM all planes in supine                  PT Short Term Goals - 12/01/14 0916    PT SHORT TERM GOAL #1   Title Pt will be independent with basic HEP   Status On-going   PT SHORT TERM GOAL #2   Title Pt will be able to achieve 120 deg scaption passively with min pain.     Status On-going   PT SHORT TERM GOAL #3   Title Pt's swelling will decrease to bearly visible in order to decrease pain and improve ROM   Status On-going   PT SHORT TERM GOAL #4   Title Maintain elbow and wrist ROM and set goal for grip strength   Status On-going           PT Long Term Goals - 12/01/14 0920    PT LONG TERM GOAL #1   Title Pt will be independent with advanced HEP   Status On-going   PT LONG TERM GOAL #2   Title Pt will have shoulder flexion and abduction srength 4/5 in order to start lifting light grocery bags and kitchen pots while cooking    Status On-going   PT LONG TERM GOAL #3   Title Pt's L shoulder AROM elevation to 130 deg to be able to safely reach to overhead cabinets     Status On-going   PT LONG TERM GOAL #4   Title Pt will be able to activate mm quickly during rhythmic stabilization in order to return to driving safely    Status On-going   PT LONG TERM GOAL #5   Title Grip strength will improve to __43lb more_ for improved lifting, carrying objects.    Status On-going               Plan - 12/13/14 2505    Clinical Impression Statement The pt. appears to be in much less pain and was able to progress to flexion, abduction, and external rotation isometric strengthening, along with pulleys for flexion. She was also able to perform supine cane exercises for shoulder flexion and external rotation.   She has increased shoulder flexion PROM (125), abduction PROM (110), external rotation PROM (46) and  internal rotation PROM (55).     PT Next Visit Plan continue PROM shoulder, AROM elbow and wrist/fingers, education, and modalities for pain. add soft tissue. check protocol and advance  as tolerated, continue isometric strengthening, recheck shoulder/elbow strength        Problem List Patient Active Problem List   Diagnosis Date Noted  . Low back pain radiating to both legs 05/06/2012  . Leg length discrepancy 05/06/2012  . HYPERTENSION, BENIGN 04/11/2009  . LEG EDEMA, BILATERAL 10/08/2008  . 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 OVARIAN FAILURE 09/02/2007  . VITAMIN D DEFICIENCY 09/02/2007  . OBESITY, NOS 10/17/2006  . ANEMIA, IRON DEFICIENCY, UNSPEC. 10/17/2006  . CARPAL TUNNEL SYNDROME 10/17/2006    Andrea Montes,McKinnley, SPTA 12/13/2014, 10:27 AM  [facility] [address] [place], [place], [address] Phone: [phone]   Fax:  [phone]

## 2014-12-15 ENCOUNTER — Ambulatory Visit: Payer: 59 | Admitting: Physical Therapy

## 2014-12-15 DIAGNOSIS — R29898 Other symptoms and signs involving the musculoskeletal system: Secondary | ICD-10-CM

## 2014-12-15 DIAGNOSIS — Z9889 Other specified postprocedural states: Secondary | ICD-10-CM

## 2014-12-15 DIAGNOSIS — M25612 Stiffness of left shoulder, not elsewhere classified: Secondary | ICD-10-CM

## 2014-12-15 DIAGNOSIS — Z4789 Encounter for other orthopedic aftercare: Secondary | ICD-10-CM | POA: Diagnosis not present

## 2014-12-15 NOTE — Therapy (Addendum)
[place], [place], [address] Phone: [phone]   Fax:  [phone]  Physical Therapy Treatment  Patient Details  Name: Andrea Montes MRN: [id number] Date of Birth: Oct 12, 1955 Referring Provider:  Seward Carol, MD  Encounter Date: 12/15/2014      PT End of Session - 12/15/14 1029    Visit Number 7   Number of Visits 16   Date for PT Re-Evaluation 01/12/15   PT Start Time 0850   PT Stop Time 0940   PT Time Calculation (min) 50 min      Past Medical History  Diagnosis Date  . History of blood transfusion   . Vitamin D deficiency   . Migraines   . Anemia     no current med.  . DDD (degenerative disc disease), lumbar   . History of shingles   . GERD (gastroesophageal reflux disease)     no current med.  . 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 Impingement syndrome of left shoulder region 10/2014  . Sleep apnea     uses CPAP nightly  . Wears partial dentures     upper  . Dental crowns present   . Edema of lower extremity     bilateral    Past Surgical History  Procedure Laterality Date  . Rotator cuff repair  2010  . Vaginal hysterectomy  2001    partial  . Shoulder arthroscopy with rotator cuff repair Right 10/04/2008  . Carpal tunnel release Left 04/25/2006  . Colonoscopy with propofol  10/09/2011  . Shoulder arthroscopy with subacromial decompression, rotator cuff repair and bicep tendon repair Left 11/04/2014    Procedure: LEFT SHOULDER ARTHROSCOPY WITH DEBRIDEMENT, DISTAL CLAVICLE EXCISION, ACROMIOPLASTY, ROTATOR CUFF REPAIR AND BICEP TENODESIS;  Surgeon: Kathryne Hitch, MD;  Location: [facility];  Service: Orthopedics;  Laterality: Left;  . Resection distal clavical Left 11/04/2014    Procedure: RESECTION DISTAL CLAVICAL;  Surgeon: Kathryne Hitch, MD;  Location: [facility];  Service: Orthopedics;  Laterality: Left;    There were no  vitals filed for this visit.  Visit Diagnosis:  No diagnosis found.      Subjective Assessment - 12/15/14 0919    Subjective "My pain has definitely gone down and stayed down.  It stays around a 2/10." she reports that she continues to do her isometric exercises at home and is doing well with them.  2/10 pain after treatment.   Currently in Pain? Yes   Pain Score 2    Pain Location Shoulder   Pain Orientation Left            [facility] PT Assessment - 12/15/14 0001    PROM   Right Shoulder External Rotation 55 Degrees   Left Shoulder Flexion 125 Degrees   Left Shoulder ABduction 111 Degrees   Left Shoulder Internal Rotation 55 Degrees                     [facility] Adult PT Treatment/Exercise - 12/15/14 0900    Shoulder Exercises: Supine   Horizontal ABduction Strengthening;10 reps;Theraband   Theraband Level (Shoulder Horizontal ABduction) Level 1 (Yellow)   Horizontal ABduction Limitations cues for control   Flexion Strengthening;10 reps;Theraband   Theraband Level (Shoulder Flexion) Level 1 (Yellow)   Other Supine Exercises cane exercise external rotation, flexion, punches 10 sec hold x 10   Shoulder Exercises: Pulleys   Flexion 2 minutes   ABduction 2  minutes  cues to keep arms out to the side   Shoulder Exercises: Isometric Strengthening   Flexion Other (comment)  30 sec hold x 5   Extension Other (comment)  30 sec hold x 5   Extension Limitations cues for good posture   External Rotation Other (comment)  30 sec hold x 5   Internal Rotation Other (comment)  30 sec hold x 5   ABduction Other (comment)  30 sec hold x 5   Modalities   Modalities Electrical Stimulation;Cryotherapy   Cryotherapy   Number Minutes Cryotherapy 15 Minutes   Cryotherapy Location Shoulder   Type of Cryotherapy Ice pack   Electrical Stimulation   Electrical Stimulation Location left shoulder   Electrical Stimulation Action IFC   Electrical Stimulation Parameters level 14    Electrical Stimulation Goals Pain   Manual Therapy   Passive ROM all planes in supine                  PT Short Term Goals - 12/15/14 0920    PT SHORT TERM GOAL #1   Title Pt will be independent with basic HEP   Time 4   Period Weeks   Status Achieved   PT SHORT TERM GOAL #2   Title Pt will be able to achieve 120 deg scaption passively with min pain.     Time 4   Period Weeks   Status On-going   PT SHORT TERM GOAL #3   Title Pt's swelling will decrease to bearly visible in order to decrease pain and improve ROM   Time 4   Period Weeks   Status Achieved   PT SHORT TERM GOAL #4   Title Maintain elbow and wrist ROM and set goal for grip strength   Time 4   Period Weeks   Status On-going           PT Long Term Goals - 12/01/14 0920    PT LONG TERM GOAL #1   Title Pt will be independent with advanced HEP   Status On-going   PT LONG TERM GOAL #2   Title Pt will have shoulder flexion and abduction srength 4/5 in order to start lifting light grocery bags and kitchen pots while cooking    Status On-going   PT LONG TERM GOAL #3   Title Pt's L shoulder AROM elevation to 130 deg to be able to safely reach to overhead cabinets     Status On-going   PT LONG TERM GOAL #4   Title Pt will be able to activate mm quickly during rhythmic stabilization in order to return to driving safely    Status On-going   PT LONG TERM GOAL #5   Title Grip strength will improve to __43lb more_ for improved lifting, carrying objects.    Status On-going               Plan - 12/15/14 1025    Clinical Impression Statement The pt. is continuing to be at a decreased pain level (2/10) and was able to progress to increased isometric hold time and supine theraband exercises.  She is right on track with her protocol.  She also reported that the swelling has mostly gone down and is staying down.  She sees the MD on Tuesday.    PT Next Visit Plan continue PROM shoulder, AROM, modalities for  pain. check protocol and advance as tolerated,, recheck shoulder/elbow strength for MD note needed next visit  Problem List Patient Active Problem List   Diagnosis Date Noted  . Low back pain radiating to both legs 05/06/2012  . Leg length discrepancy 05/06/2012  . HYPERTENSION, BENIGN 04/11/2009  . LEG EDEMA, BILATERAL 10/08/2008  . 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 OVARIAN FAILURE 09/02/2007  . VITAMIN D DEFICIENCY 09/02/2007  . OBESITY, NOS 10/17/2006  . ANEMIA, IRON DEFICIENCY, UNSPEC. 10/17/2006  . CARPAL TUNNEL SYNDROME 10/17/2006    Orvie Caradine,McKinnley, SPTA 12/15/2014, 12:11 PM  [facility] [address] [place], [place], [address] Phone: [phone]   Fax:  [phone]

## 2014-12-20 ENCOUNTER — Ambulatory Visit: Payer: 59 | Attending: Orthopedic Surgery | Admitting: Physical Therapy

## 2014-12-20 DIAGNOSIS — M25612 Stiffness of left shoulder, not elsewhere classified: Secondary | ICD-10-CM

## 2014-12-20 DIAGNOSIS — Z9889 Other specified postprocedural states: Secondary | ICD-10-CM

## 2014-12-20 DIAGNOSIS — R29898 Other symptoms and signs involving the musculoskeletal system: Secondary | ICD-10-CM | POA: Diagnosis not present

## 2014-12-20 DIAGNOSIS — Z4789 Encounter for other orthopedic aftercare: Secondary | ICD-10-CM | POA: Insufficient documentation

## 2014-12-20 DIAGNOSIS — M25512 Pain in left shoulder: Secondary | ICD-10-CM | POA: Diagnosis not present

## 2014-12-20 NOTE — Therapy (Addendum)
[place], [place], [address] Phone: [phone]   Fax:  [phone]  Physical Therapy Treatment  Patient Details  Name: Andrea Montes MRN: [id number] Date of Birth: Oct 18, 1955 Referring Provider:  Seward Carol, MD  Encounter Date: 12/20/2014      PT End of Session - 12/20/14 0807    Visit Number 8   Number of Visits 16   Date for PT Re-Evaluation 01/12/15   PT Start Time 0805   PT Stop Time 0905   PT Time Calculation (min) 60 min      Past Medical History  Diagnosis Date  . History of blood transfusion   . Vitamin D deficiency   . Migraines   . Anemia     no current med.  . DDD (degenerative disc disease), lumbar   . History of shingles   . GERD (gastroesophageal reflux disease)     no current med.  . 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 Impingement syndrome of left shoulder region 10/2014  . Sleep apnea     uses CPAP nightly  . Wears partial dentures     upper  . Dental crowns present   . Edema of lower extremity     bilateral    Past Surgical History  Procedure Laterality Date  . Rotator cuff repair  2010  . Vaginal hysterectomy  2001    partial  . Shoulder arthroscopy with rotator cuff repair Right 10/04/2008  . Carpal tunnel release Left 04/25/2006  . Colonoscopy with propofol  10/09/2011  . Shoulder arthroscopy with subacromial decompression, rotator cuff repair and bicep tendon repair Left 11/04/2014    Procedure: LEFT SHOULDER ARTHROSCOPY WITH DEBRIDEMENT, DISTAL CLAVICLE EXCISION, ACROMIOPLASTY, ROTATOR CUFF REPAIR AND BICEP TENODESIS;  Surgeon: Kathryne Hitch, MD;  Location: [facility];  Service: Orthopedics;  Laterality: Left;  . Resection distal clavical Left 11/04/2014    Procedure: RESECTION DISTAL CLAVICAL;  Surgeon: Kathryne Hitch, MD;  Location: [facility];  Service: Orthopedics;  Laterality: Left;    There were no  vitals filed for this visit.  Visit Diagnosis:  S/P rotator cuff repair  Shoulder weakness  Decreased range of motion of left shoulder      Subjective Assessment - 12/20/14 0808    Subjective "I'm not in pain today, just stiff.  The swelling has also returned around my shoulder." 2/10 pain before treatment, 4/10 pain after treatment and before ice/e-stim, 4/26 after application of ice and e-stim.     Currently in Pain? Yes   Pain Score 2             [facility] PT Assessment - 12/20/14 0815    ROM / Strength   AROM / PROM / Strength --   PROM   Right Shoulder External Rotation 63 Degrees   Left Shoulder Flexion 130 Degrees   Left Shoulder ABduction 115 Degrees   Left Shoulder Internal Rotation 66 Degrees                     [facility] Adult PT Treatment/Exercise - 12/20/14 0811    Shoulder Exercises: Supine   External Rotation Strengthening;10 reps;Theraband   Theraband Level (Shoulder External Rotation) Level 1 (Yellow)   Internal Rotation 10 reps;Strengthening;Theraband   Theraband Level (Shoulder Internal Rotation) Level 1 (Yellow)   Other Supine Exercises cane exercises -flexion and external rotation x 10  pt. c/o significant amount of pain with flexion  Shoulder Exercises: Sidelying   ABduction 10 reps;AROM  w/ bent elbow; pt. c/o pain   Shoulder Exercises: Pulleys   Flexion 2 minutes   Shoulder Exercises: ROM/Strengthening   UBE (Upper Arm Bike) 90 RPM x 5 min   Cryotherapy   Number Minutes Cryotherapy 15 Minutes   Cryotherapy Location Shoulder   Type of Cryotherapy Ice pack   Electrical Stimulation   Electrical Stimulation Location left shoulder   Electrical Stimulation Action IFC   Electrical Stimulation Parameters level 14   Electrical Stimulation Goals Pain   Manual Therapy   Passive ROM all planes in supine                  PT Short Term Goals - 12/15/14 0920    PT SHORT TERM GOAL #1   Title Pt will be independent with basic HEP    Time 4   Period Weeks   Status Achieved   PT SHORT TERM GOAL #2   Title Pt will be able to achieve 120 deg scaption passively with min pain.     Time 4   Period Weeks   Status On-going   PT SHORT TERM GOAL #3   Title Pt's swelling will decrease to bearly visible in order to decrease pain and improve ROM   Time 4   Period Weeks   Status Achieved   PT SHORT TERM GOAL #4   Title Maintain elbow and wrist ROM and set goal for grip strength   Time 4   Period Weeks   Status On-going           PT Long Term Goals - 12/01/14 0920    PT LONG TERM GOAL #1   Title Pt will be independent with advanced HEP   Status On-going   PT LONG TERM GOAL #2   Title Pt will have shoulder flexion and abduction srength 4/5 in order to start lifting light grocery bags and kitchen pots while cooking    Status On-going   PT LONG TERM GOAL #3   Title Pt's L shoulder AROM elevation to 130 deg to be able to safely reach to overhead cabinets     Status On-going   PT LONG TERM GOAL #4   Title Pt will be able to activate mm quickly during rhythmic stabilization in order to return to driving safely    Status On-going   PT LONG TERM GOAL #5   Title Grip strength will improve to __43lb more_ for improved lifting, carrying objects.    Status On-going               Plan - 12/20/14 1017    Clinical Impression Statement The pt. was stiff upon arrival and had pain throughout mid range of AAROM.  Her PROM ER measued at 63 degrees and her PROM IR rotation measured at 66 degrees. Shoulder flexion was measured AAROM due to the pt. being stiff/in pain and unable to do AROM.  AAROM shoulder flexion measured at 111 degrees.  The pt. also reported that the swelling has returned and it's very tender around her shoulder and surgery incision. She able to perform supine IR and ER theraband exercises and supine AAROM flexion with cane.  The pt. was very limited by pain today.        PT Next Visit Plan continue PROM  shoulder, AROM, modalities for pain. check protocol and advance as tolerated         Problem List Patient Active Problem List  Diagnosis Date Noted  . Low back pain radiating to both legs 05/06/2012  . Leg length discrepancy 05/06/2012  . HYPERTENSION, BENIGN 04/11/2009  . LEG EDEMA, BILATERAL 10/08/2008  . 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 OVARIAN FAILURE 09/02/2007  . VITAMIN D DEFICIENCY 09/02/2007  . OBESITY, NOS 10/17/2006  . ANEMIA, IRON DEFICIENCY, UNSPEC. 10/17/2006  . CARPAL TUNNEL SYNDROME 10/17/2006    Usama Harkless,McKinnley, SPTA 12/20/2014, 9:15 AM  [place], [place], [address] Phone: [phone]   Fax:  [phone]

## 2014-12-22 ENCOUNTER — Ambulatory Visit: Payer: 59 | Admitting: Physical Therapy

## 2014-12-22 DIAGNOSIS — Z4789 Encounter for other orthopedic aftercare: Secondary | ICD-10-CM | POA: Diagnosis not present

## 2014-12-22 DIAGNOSIS — Z9889 Other specified postprocedural states: Secondary | ICD-10-CM

## 2014-12-22 DIAGNOSIS — M25612 Stiffness of left shoulder, not elsewhere classified: Secondary | ICD-10-CM

## 2014-12-22 DIAGNOSIS — R29898 Other symptoms and signs involving the musculoskeletal system: Secondary | ICD-10-CM

## 2014-12-22 NOTE — Therapy (Signed)
[place], [place], [address] Phone: [phone]   Fax:  [phone]  Physical Therapy Treatment  Patient Details  Name: Andrea Montes MRN: [id number] Date of Birth: 11-30-1955 Referring Provider:  Seward Carol, MD  Encounter Date: 12/22/2014      PT End of Session - 12/22/14 1058    Visit Number 9   Number of Visits 16   Date for PT Re-Evaluation 01/12/15   PT Start Time 7619   PT Stop Time 0937   PT Time Calculation (min) 50 min   Activity Tolerance Patient tolerated treatment well;Patient limited by pain   Behavior During Therapy [facility] for tasks assessed/performed      Past Medical History  Diagnosis Date  . History of blood transfusion   . Vitamin D deficiency   . Migraines   . Anemia     no current med.  . DDD (degenerative disc disease), lumbar   . History of shingles   . GERD (gastroesophageal reflux disease)     no current med.  . 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 Impingement syndrome of left shoulder region 10/2014  . Sleep apnea     uses CPAP nightly  . Wears partial dentures     upper  . Dental crowns present   . Edema of lower extremity     bilateral    Past Surgical History  Procedure Laterality Date  . Rotator cuff repair  2010  . Vaginal hysterectomy  2001    partial  . Shoulder arthroscopy with rotator cuff repair Right 10/04/2008  . Carpal tunnel release Left 04/25/2006  . Colonoscopy with propofol  10/09/2011  . Shoulder arthroscopy with subacromial decompression, rotator cuff repair and bicep tendon repair Left 11/04/2014    Procedure: LEFT SHOULDER ARTHROSCOPY WITH DEBRIDEMENT, DISTAL CLAVICLE EXCISION, ACROMIOPLASTY, ROTATOR CUFF REPAIR AND BICEP TENODESIS;  Surgeon: Andrea Hitch, MD;  Location: [place];  Service: Orthopedics;  Laterality: Left;  . Resection distal clavical Left 11/04/2014    Procedure: RESECTION DISTAL  CLAVICAL;  Surgeon: Andrea Hitch, MD;  Location: [place];  Service: Orthopedics;  Laterality: Left;    There were no vitals filed for this visit.  Visit Diagnosis:  S/P rotator cuff repair  Shoulder weakness  Decreased range of motion of left shoulder      Subjective Assessment - 12/22/14 1047    Subjective Pain and swelling is better today at 1/10. Went to see MD yestedray, he is pleased with the progress and says that biceps tendon is just irritated due to exercises as part of regaining strength and motion.    Currently in Pain? Yes   Pain Score 1    Pain Location Shoulder   Pain Orientation Left   Pain Descriptors / Indicators Sore   Pain Type Surgical pain   Pain Onset 1 to 4 weeks ago   Pain Frequency Intermittent   Aggravating Factors  end range motion   Pain Relieving Factors rest   Multiple Pain Sites No          [facility] Adult PT Treatment/Exercise - 12/22/14 1050    Shoulder Exercises: Supine   Other Supine Exercises cane exercises -flex, hor abd, ER x 10   Shoulder Exercises: ROM/Strengthening   UBE (Upper Arm Bike) res level2 4 minutes, 2 frwd, 2 bckwrd   [place] into flexion x 5  pt didn't tolerate, severe pain   Ultrasound  Ultrasound Location L shoulder   Ultrasound Parameters 50% duty, 1 w/cm2 intensity, 1 Mhz frequency 8 minutes   Ultrasound Goals Edema;Pain   Manual Therapy   Joint Mobilization distraction grades 3-4, GH AP didn't tolerate, increased pain. Mobs with movemnt into flexion, abd, ER   Passive ROM all planes in supine          PT Education - 12/22/14 1056    Education provided Yes   Education Details HEP: gentle stretching, no vigorous strengthening secondary to exacerbation of pain    Person(s) Educated Patient   Methods Explanation   Comprehension Verbalized understanding          PT Short Term Goals - 12/22/14 1057    PT SHORT TERM GOAL #1   Title Pt will be independent with basic HEP   Status Achieved    PT SHORT TERM GOAL #2   Title Pt will be able to achieve 120 deg scaption passively with min pain.     Status On-going   PT SHORT TERM GOAL #3   Title Pt's swelling will decrease to bearly visible in order to decrease pain and improve ROM   Status Achieved   PT SHORT TERM GOAL #4   Title Maintain elbow and wrist ROM and set goal for grip strength   Status On-going           PT Long Term Goals - 12/22/14 1058    PT LONG TERM GOAL #1   Title Pt will be independent with advanced HEP   Status On-going   PT LONG TERM GOAL #2   Title Pt will have shoulder flexion and abduction srength 4/5 in order to start lifting light grocery bags and kitchen pots while cooking    Status On-going   PT LONG TERM GOAL #3   Title Pt's L shoulder AROM elevation to 130 deg to be able to safely reach to overhead cabinets     Status On-going   PT LONG TERM GOAL #4   Title Pt will be able to activate mm quickly during rhythmic stabilization in order to return to driving safely    Status On-going   PT LONG TERM GOAL #5   Title Grip strength will improve to __43lb more_ for improved lifting, carrying objects.    Status On-going           Plan - 12/22/14 1059    Clinical Impression Statement Andrea Montes has increase in pain during AROM of L shoulder, especially flexion and abduction. She describes it as "catching of biceps tendon", the pain becomes severe and she is unable to continue exercises. Performed AAROM, mobilizations, PROM, and ultrasound today to improve pain and ROM. Pt tolerated well.    PT Next Visit Plan continue PROM shoulder, AROM, modalities for pain. check protocol and advance as tolerated. Assess HEP, add gentle strengthening.   PT Home Exercise Plan Pendulums, elbow flexion, grip exercise, cryotherapy, posture   Consulted and Agree with Plan of Care Patient        Problem List Patient Active Problem List   Diagnosis Date Noted  . Low back pain radiating to both legs 05/06/2012  .  Leg length discrepancy 05/06/2012  . HYPERTENSION, BENIGN 04/11/2009  . LEG EDEMA, BILATERAL 10/08/2008  . 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 OVARIAN FAILURE 09/02/2007  . VITAMIN D DEFICIENCY 09/02/2007  . OBESITY, NOS 10/17/2006  . ANEMIA, IRON DEFICIENCY, UNSPEC. 10/17/2006  . CARPAL TUNNEL SYNDROME 10/17/2006    PAA,Andrea Montes  12/22/2014, 1:12 PM  [name], [place], [address] Phone: [phone]   Fax:  [phone]

## 2014-12-29 ENCOUNTER — Ambulatory Visit: Payer: 59 | Admitting: Physical Therapy

## 2014-12-29 DIAGNOSIS — M25612 Stiffness of left shoulder, not elsewhere classified: Secondary | ICD-10-CM

## 2014-12-29 DIAGNOSIS — R29898 Other symptoms and signs involving the musculoskeletal system: Secondary | ICD-10-CM

## 2014-12-29 DIAGNOSIS — Z9889 Other specified postprocedural states: Secondary | ICD-10-CM

## 2014-12-29 DIAGNOSIS — Z4789 Encounter for other orthopedic aftercare: Secondary | ICD-10-CM | POA: Diagnosis not present

## 2014-12-29 NOTE — Patient Instructions (Signed)
Resisted External Rotation: in Neutral - Bilateral   Sit or stand, tubing in both hands, elbows at sides, bent to 90, forearms forward. Pinch shoulder blades together and rotate forearms out. Keep elbows at sides. Repeat _10-20___ times per set. Do __2__ sets per session. Do ____2 sessions per day. YELLOW  http://orth.exer.us/967   Copyright  VHI. All rights reserved.  Low Row: Thumbs Up   Face anchor, medium to wide stance. Thumbs up, pull arms back, squeezing shoulder blades together.  Repeat _10-20_ times per set. Do _1-2_ sets per session. Do _5_ sessions per week. RED Anchor Height: Waist  http://tub.exer.us/68   Copyright  VHI. All rights reserved.  Resisted Horizontal Abduction: Bilateral   Sit or stand, tubing in both hands, arms out in front. Keeping arms straight, pinch shoulder blades together and stretch arms out. Repeat __10-20__ times per set. Do __1-2__ sets per session. Do __2__ sessions per day. YELLOW  http://orth.exer.us/969   Copyright  VHI. All rights reserved.

## 2014-12-29 NOTE — Therapy (Signed)
[name], [place], [address] Phone: [phone]   Fax:  [phone]  Physical Therapy Treatment  Patient Details  Name: Andrea Montes MRN: [id number] Date of Birth: [birth year] Referring Provider:  Seward Carol, MD  Encounter Date: 12/29/2014      PT End of Session - 12/29/14 0845    Visit Number 10   Number of Visits 16   Date for PT Re-Evaluation 01/12/15   PT Start Time 0800   PT Stop Time 0900   PT Time Calculation (min) 60 min   Activity Tolerance Patient tolerated treatment well   Behavior During Therapy [facility] for tasks assessed/performed      Past Medical History  Diagnosis Date  . History of blood transfusion   . Vitamin D deficiency   . Migraines   . Anemia     no current med.  . DDD (degenerative disc disease), lumbar   . History of shingles   . GERD (gastroesophageal reflux disease)     no current med.  . 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 Impingement syndrome of left shoulder region 10/2014  . Sleep apnea     uses CPAP nightly  . Wears partial dentures     upper  . Dental crowns present   . Edema of lower extremity     bilateral    Past Surgical History  Procedure Laterality Date  . Rotator cuff repair  2010  . Vaginal hysterectomy  2001    partial  . Shoulder arthroscopy with rotator cuff repair Right 10/04/2008  . Carpal tunnel release Left 04/25/2006  . Colonoscopy with propofol  10/09/2011  . Shoulder arthroscopy with subacromial decompression, rotator cuff repair and bicep tendon repair Left 11/04/2014    Procedure: LEFT SHOULDER ARTHROSCOPY WITH DEBRIDEMENT, DISTAL CLAVICLE EXCISION, ACROMIOPLASTY, ROTATOR CUFF REPAIR AND BICEP TENODESIS;  Surgeon: Kathryne Hitch, MD;  Location: [place];  Service: Orthopedics;  Laterality: Left;  . Resection distal clavical Left 11/04/2014    Procedure: RESECTION DISTAL CLAVICAL;  Surgeon:  Kathryne Hitch, MD;  Location: [place];  Service: Orthopedics;  Laterality: Left;    There were no vitals filed for this visit.  Visit Diagnosis:  S/P rotator cuff repair  Shoulder weakness  Decreased range of motion of left shoulder      Subjective Assessment - 12/29/14 0804    Subjective My arm is not as tight, still not sleeping too well due to pain.  Min pain 1/0-2/10 lateral L shoulder. I can raise             [facility] PT Assessment - 12/29/14 0812    ROM / Strength   AROM / PROM / Strength --  Lt grip: 17, 18, 12 kg , Rt. 19 kg           [facility] Adult PT Treatment/Exercise - 12/29/14 0817    Shoulder Exercises: Supine   Protraction Strengthening;Both;20 reps;Weights   Protraction Weight (lbs) 2   Horizontal ABduction Strengthening;10 reps;Theraband   Theraband Level (Shoulder Horizontal ABduction) Level 1 (Yellow)   External Rotation Strengthening;Both;20 reps;Theraband   Theraband Level (Shoulder External Rotation) Level 1 (Yellow)   Internal Rotation Strengthening;Both;20 reps;Theraband   Theraband Level (Shoulder Internal Rotation) Level 1 (Yellow)   Shoulder Exercises: Standing   Flexion Strengthening;Left;20 reps;Theraband   Theraband Level (Shoulder Flexion) Level 1 (Yellow)   ABduction Strengthening;Left;10 reps;Theraband   Theraband Level (Shoulder ABduction) Level 1 (Yellow)  Shoulder Exercises: ROM/Strengthening   Other ROM/Strengthening Exercises supine cane: ER, chest press, abduction, flexion x10 each    Modalities   Modalities Electrical Stimulation;Cryotherapy   Cryotherapy   Cryotherapy Location Shoulder   Electrical Stimulation   Electrical Stimulation Location left shoulder   Electrical Stimulation Action IFC   Electrical Stimulation Parameters 11   Electrical Stimulation Goals Pain   Manual Therapy   Joint Mobilization distraction grades 3-4, GH AP didn't tolerate, increased pain. Mobs with movemnt into flexion, abd, ER    Passive ROM all planes in supine      Pulleys flexion, scaption 3 min each plane in sitting        PT Education - 12/29/14 0843    Education provided Yes   Education Details strength HEP   Person(s) Educated Patient   Methods Explanation;Demonstration;Handout   Comprehension Returned demonstration;Verbalized understanding          PT Short Term Goals - 12/29/14 0846    PT SHORT TERM GOAL #1   Title Pt will be independent with basic HEP   Status Achieved   PT SHORT TERM GOAL #2   Title Pt will be able to achieve 120 deg scaption passively with min pain.     Status On-going   PT SHORT TERM GOAL #3   Title Pt's swelling will decrease to bearly visible in order to decrease pain and improve ROM   Status Achieved   PT SHORT TERM GOAL #4   Title Maintain elbow and wrist ROM and set goal for grip strength   Status Achieved           PT Long Term Goals - 12/29/14 0847    PT LONG TERM GOAL #1   Title Pt will be independent with advanced HEP   Status On-going   PT LONG TERM GOAL #2   Title Pt will have shoulder flexion and abduction srength 4/5 in order to start lifting light grocery bags and kitchen pots while cooking    Status On-going   PT LONG TERM GOAL #3   Title Pt's L shoulder AROM elevation to 130 deg to be able to safely reach to overhead cabinets     Status On-going   PT LONG TERM GOAL #4   Title Pt will be able to activate mm quickly during rhythmic stabilization in order to return to driving safely    Status On-going   PT LONG TERM GOAL #5   Title Grip strength will improve to __43lb more_ for improved lifting, carrying objects.    Baseline 38 lbs (15 kg)   Status On-going               Plan - 12/29/14 0845    Clinical Impression Statement Patient tolerated light theraband ex with min increase in pain.  Given for HEP as she was not doing much yet for strength.     PT Next Visit Plan check new HEP, cont with PRE and ROM, manual, modailites   PT  Home Exercise Plan added today   Consulted and Agree with Plan of Care Patient        Problem List Patient Active Problem List   Diagnosis Date Noted  . Low back pain radiating to both legs 05/06/2012  . Leg length discrepancy 05/06/2012  . HYPERTENSION, BENIGN 04/11/2009  . LEG EDEMA, BILATERAL 10/08/2008  . 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 OVARIAN FAILURE 09/02/2007  . VITAMIN D DEFICIENCY 09/02/2007  . OBESITY, NOS 10/17/2006  .  ANEMIA, IRON DEFICIENCY, UNSPEC. 10/17/2006  . CARPAL TUNNEL SYNDROME 10/17/2006    Jaynee Winters 12/29/2014, 8:48 AM  [facility] [address] [place], [place], [address] Phone: [phone]   Fax:  [phone]

## 2014-12-31 ENCOUNTER — Ambulatory Visit: Payer: 59 | Admitting: Physical Therapy

## 2014-12-31 DIAGNOSIS — Z9889 Other specified postprocedural states: Secondary | ICD-10-CM

## 2014-12-31 DIAGNOSIS — M25612 Stiffness of left shoulder, not elsewhere classified: Secondary | ICD-10-CM

## 2014-12-31 DIAGNOSIS — Z4789 Encounter for other orthopedic aftercare: Secondary | ICD-10-CM | POA: Diagnosis not present

## 2014-12-31 DIAGNOSIS — R29898 Other symptoms and signs involving the musculoskeletal system: Secondary | ICD-10-CM

## 2014-12-31 NOTE — Therapy (Signed)
[place], [place], [address] Phone: [phone]   Fax:  [phone]  Physical Therapy Treatment  Patient Details  Name: Andrea Montes MRN: [id number] Date of Birth: Sep 25, 1955 Referring Provider:  Seward Carol, MD  Encounter Date: 12/31/2014      PT End of Session - 12/31/14 0915    Visit Number 11   Number of Visits 16   Date for PT Re-Evaluation 01/12/15   PT Start Time 0846   PT Stop Time 0925   PT Time Calculation (min) 39 min   Activity Tolerance Patient tolerated treatment well      Past Medical History  Diagnosis Date  . History of blood transfusion   . Vitamin D deficiency   . Migraines   . Anemia     no current med.  . DDD (degenerative disc disease), lumbar   . History of shingles   . GERD (gastroesophageal reflux disease)     no current med.  . 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 Impingement syndrome of left shoulder region 10/2014  . Sleep apnea     uses CPAP nightly  . Wears partial dentures     upper  . Dental crowns present   . Edema of lower extremity     bilateral    Past Surgical History  Procedure Laterality Date  . Rotator cuff repair  2010  . Vaginal hysterectomy  2001    partial  . Shoulder arthroscopy with rotator cuff repair Right 10/04/2008  . Carpal tunnel release Left 04/25/2006  . Colonoscopy with propofol  10/09/2011  . Shoulder arthroscopy with subacromial decompression, rotator cuff repair and bicep tendon repair Left 11/04/2014    Procedure: LEFT SHOULDER ARTHROSCOPY WITH DEBRIDEMENT, DISTAL CLAVICLE EXCISION, ACROMIOPLASTY, ROTATOR CUFF REPAIR AND BICEP TENODESIS;  Surgeon: Kathryne Hitch, MD;  Location: [place];  Service: Orthopedics;  Laterality: Left;  . Resection distal clavical Left 11/04/2014    Procedure: RESECTION DISTAL CLAVICAL;  Surgeon: Kathryne Hitch, MD;  Location: [place];   Service: Orthopedics;  Laterality: Left;    There were no vitals filed for this visit.  Visit Diagnosis:  S/P rotator cuff repair  Shoulder weakness  Decreased range of motion of left shoulder      Subjective Assessment - 12/31/14 0914    Subjective Pain 2/10 today, LUE.  Going back to light duty work today!   Currently in Pain? Yes   Pain Score 2           [facility] Adult PT Treatment/Exercise - 12/31/14 0857    Shoulder Exercises: Standing   External Rotation Strengthening;Left;20 reps;Theraband   Theraband Level (Shoulder External Rotation) Level 1 (Yellow)   Internal Rotation Strengthening;Left;20 reps;Theraband   Theraband Level (Shoulder Internal Rotation) Level 1 (Yellow)   Flexion Strengthening;Left;20 reps   Theraband Level (Shoulder Flexion) Level 1 (Yellow)   Extension Strengthening;Both;20 reps;Theraband   Theraband Level (Shoulder Extension) Level 3 (Green)   Row Strengthening;Both;20 reps;Theraband   Theraband Level (Shoulder Row) Level 2 (Red);Level 3 (Green)   Shoulder Exercises: ROM/Strengthening   UBE (Upper Arm Bike) level 1, 3 min FW, 3 min back   Modalities   Modalities Electrical Stimulation;Cryotherapy   Electrical Stimulation   Electrical Stimulation Location left shoulder   Electrical Stimulation Action IFC   Electrical Stimulation Parameters 13   Electrical Stimulation Goals Pain           PT Education - 12/31/14  0915    Education provided No          PT Short Term Goals - 12/29/14 0846    PT SHORT TERM GOAL #1   Title Pt will be independent with basic HEP   Status Achieved   PT SHORT TERM GOAL #2   Title Pt will be able to achieve 120 deg scaption passively with min pain.     Status On-going   PT SHORT TERM GOAL #3   Title Pt's swelling will decrease to bearly visible in order to decrease pain and improve ROM   Status Achieved   PT SHORT TERM GOAL #4   Title Maintain elbow and wrist ROM and set goal for grip strength   Status  Achieved           PT Long Term Goals - 12/29/14 0847    PT LONG TERM GOAL #1   Title Pt will be independent with advanced HEP   Status On-going   PT LONG TERM GOAL #2   Title Pt will have shoulder flexion and abduction srength 4/5 in order to start lifting light grocery bags and kitchen pots while cooking    Status On-going   PT LONG TERM GOAL #3   Title Pt's L shoulder AROM elevation to 130 deg to be able to safely reach to overhead cabinets     Status On-going   PT LONG TERM GOAL #4   Title Pt will be able to activate mm quickly during rhythmic stabilization in order to return to driving safely    Status On-going   PT LONG TERM GOAL #5   Title Grip strength will improve to __43lb more_ for improved lifting, carrying objects.    Baseline 38 lbs (15 kg)   Status On-going               Plan - 12/31/14 0915    Clinical Impression Statement Back to work today, requested lighter session as a result.     PT Next Visit Plan check new HEP, cont with PRE and ROM, manual, modailites   Consulted and Agree with Plan of Care Patient        Problem List Patient Active Problem List   Diagnosis Date Noted  . Low back pain radiating to both legs 05/06/2012  . Leg length discrepancy 05/06/2012  . HYPERTENSION, BENIGN 04/11/2009  . LEG EDEMA, BILATERAL 10/08/2008  . 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 OVARIAN FAILURE 09/02/2007  . VITAMIN D DEFICIENCY 09/02/2007  . OBESITY, NOS 10/17/2006  . ANEMIA, IRON DEFICIENCY, UNSPEC. 10/17/2006  . CARPAL TUNNEL SYNDROME 10/17/2006    [name] 12/31/2014, 9:16 AM  [facility] [address] [place], [place], [address] Phone: [phone]   Fax:  [phone]   Raeford Razor, PT 12/31/2014 9:17 AM Phone: [phone] Fax: [phone]

## 2015-01-03 ENCOUNTER — Ambulatory Visit: Payer: 59 | Admitting: Physical Therapy

## 2015-01-03 DIAGNOSIS — Z4789 Encounter for other orthopedic aftercare: Secondary | ICD-10-CM | POA: Diagnosis not present

## 2015-01-03 DIAGNOSIS — M25612 Stiffness of left shoulder, not elsewhere classified: Secondary | ICD-10-CM

## 2015-01-03 DIAGNOSIS — R29898 Other symptoms and signs involving the musculoskeletal system: Secondary | ICD-10-CM

## 2015-01-03 DIAGNOSIS — Z9889 Other specified postprocedural states: Secondary | ICD-10-CM

## 2015-01-03 NOTE — Patient Instructions (Signed)
Printed off info on Home TENS purchase

## 2015-01-03 NOTE — Therapy (Signed)
Andrea Montes, [place], [address] Phone: [phone]   Fax:  [phone]  Physical Therapy Treatment  Patient Details  Name: Andrea Montes MRN: [id number] Date of Birth: 1956-06-06 Referring Provider:  Seward Carol, MD  Encounter Date: 01/03/2015      PT End of Session - 01/03/15 0916    Visit Number 12   Number of Visits 16   Date for PT Re-Evaluation 01/12/15   PT Start Time 0822  pt late, lost keys   PT Stop Time 0912   PT Time Calculation (min) 50 min   Activity Tolerance Patient tolerated treatment well      Past Medical History  Diagnosis Date  . History of blood transfusion   . Vitamin D deficiency   . Migraines   . Anemia     no current med.  . DDD (degenerative disc disease), lumbar   . History of shingles   . GERD (gastroesophageal reflux disease)     no current med.  . 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 Impingement syndrome of left shoulder region 10/2014  . Sleep apnea     uses CPAP nightly  . Wears partial dentures     upper  . Dental crowns present   . Edema of lower extremity     bilateral    Past Surgical History  Procedure Laterality Date  . Rotator cuff repair  2010  . Vaginal hysterectomy  2001    partial  . Shoulder arthroscopy with rotator cuff repair Right 10/04/2008  . Carpal tunnel release Left 04/25/2006  . Colonoscopy with propofol  10/09/2011  . Shoulder arthroscopy with subacromial decompression, rotator cuff repair and bicep tendon repair Left 11/04/2014    Procedure: LEFT SHOULDER ARTHROSCOPY WITH DEBRIDEMENT, DISTAL CLAVICLE EXCISION, ACROMIOPLASTY, ROTATOR CUFF REPAIR AND BICEP TENODESIS;  Surgeon: Kathryne Hitch, MD;  Location: [facility];  Service: Orthopedics;  Laterality: Left;  . Resection distal clavical Left 11/04/2014    Procedure: RESECTION DISTAL CLAVICAL;  Surgeon: Kathryne Hitch, MD;  Location: [facility];  Service: Orthopedics;  Laterality: Left;    There were no vitals filed for this visit.  Visit Diagnosis:  S/P rotator cuff repair  Shoulder weakness  Decreased range of motion of left shoulder      Subjective Assessment - 01/03/15 0827    Subjective Pt reports soreness today, Friday was really "rough", pain end of day 8/10.  Not resting well. Can't get comfortable.            [facility] PT Assessment - 01/03/15 0914    Palpation   Palpation L lateral neck and supraclavicular area puffy and swollen, limiting ROM today           [facility] Adult PT Treatment/Exercise - 01/03/15 0829    Shoulder Exercises: Seated   Elevation AAROM;Strengthening;Both;20 reps   Extension AAROM;Strengthening;Both;20 reps   Horizontal ABduction AAROM;Both;20 reps   External Rotation AAROM;Left;20 reps   Flexion AAROM;Strengthening;5 reps   Other Seated Exercises cane ex initially done in sitting, pain and decr ROM limited, change to supine   Shoulder Exercises: ROM/Strengthening   UBE (Upper Arm Bike) level 1, 6 min (3 min FW and 3 min back)   Cryotherapy   Number Minutes Cryotherapy 15 Minutes   Cryotherapy Location Shoulder   Type of Cryotherapy --  Game Ready   Manual Therapy   Manual therapy comments K tape for edema and L shoulder  pain, 2 fans           PT Education - 01/03/15 0915    Education provided Yes   Education Details home TENS and K tape for swelling   Person(s) Educated Patient   Methods Explanation;Demonstration;Handout   Comprehension Verbalized understanding          PT Short Term Goals - 12/29/14 0846    PT SHORT TERM GOAL #1   Title Pt will be independent with basic HEP   Status Achieved   PT SHORT TERM GOAL #2   Title Pt will be able to achieve 120 deg scaption passively with min pain.     Status On-going   PT SHORT TERM GOAL #3   Title Pt's swelling will decrease to bearly visible in order to decrease pain and improve ROM   Status Achieved    PT SHORT TERM GOAL #4   Title Maintain elbow and wrist ROM and set goal for grip strength   Status Achieved           PT Long Term Goals - 12/29/14 0847    PT LONG TERM GOAL #1   Title Pt will be independent with advanced HEP   Status On-going   PT LONG TERM GOAL #2   Title Pt will have shoulder flexion and abduction srength 4/5 in order to start lifting light grocery bags and kitchen pots while cooking    Status On-going   PT LONG TERM GOAL #3   Title Pt's L shoulder AROM elevation to 130 deg to be able to safely reach to overhead cabinets     Status On-going   PT LONG TERM GOAL #4   Title Pt will be able to activate mm quickly during rhythmic stabilization in order to return to driving safely    Status On-going   PT LONG TERM GOAL #5   Title Grip strength will improve to __43lb more_ for improved lifting, carrying objects.    Baseline 38 lbs (15 kg)   Status On-going               Plan - 01/03/15 0916    Clinical Impression Statement Patient stiff today, unable to lift arm to 90 deg with use of cane.  She reports significant pain at night and difficulty getting comfortable. May consider home TENS.    PT Next Visit Plan check new HEP, cont with PRE and ROM, manual, modailites   PT Home Exercise Plan as prev.    Consulted and Agree with Plan of Care Patient        Problem List Patient Active Problem List   Diagnosis Date Noted  . Low back pain radiating to both legs 05/06/2012  . Leg length discrepancy 05/06/2012  . HYPERTENSION, BENIGN 04/11/2009  . LEG EDEMA, BILATERAL 10/08/2008  . 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 OVARIAN FAILURE 09/02/2007  . VITAMIN D DEFICIENCY 09/02/2007  . OBESITY, NOS 10/17/2006  . ANEMIA, IRON DEFICIENCY, UNSPEC. 10/17/2006  . CARPAL TUNNEL SYNDROME 10/17/2006    Andrea Montes 01/03/2015, 9:18 AM  [facility] [address] [place], [place],  [address] Phone: [phone]   Fax:  [phone]    Andrea Montes, PT 01/03/2015 9:19 AM Phone: [phone] Fax: [phone]

## 2015-01-05 ENCOUNTER — Ambulatory Visit: Payer: 59 | Admitting: Physical Therapy

## 2015-01-05 DIAGNOSIS — Z9889 Other specified postprocedural states: Secondary | ICD-10-CM

## 2015-01-05 DIAGNOSIS — R29898 Other symptoms and signs involving the musculoskeletal system: Secondary | ICD-10-CM

## 2015-01-05 DIAGNOSIS — Z4789 Encounter for other orthopedic aftercare: Secondary | ICD-10-CM | POA: Diagnosis not present

## 2015-01-05 DIAGNOSIS — M25612 Stiffness of left shoulder, not elsewhere classified: Secondary | ICD-10-CM

## 2015-01-05 NOTE — Therapy (Signed)
[place], [place], [address] Phone: [phone]   Fax:  [phone]  Physical Therapy Treatment  Patient Details  Name: Andrea Montes MRN: [id number] Date of Birth: 07-01-1956 Referring Provider:  Seward Carol, MD  Encounter Date: 01/05/2015      PT End of Session - 01/05/15 0945    Visit Number 13   Number of Visits 16   Date for PT Re-Evaluation 01/12/15   PT Start Time 0805   PT Stop Time 0905   PT Time Calculation (min) 60 min      Past Medical History  Diagnosis Date  . History of blood transfusion   . Vitamin D deficiency   . Migraines   . Anemia     no current med.  . DDD (degenerative disc disease), lumbar   . History of shingles   . GERD (gastroesophageal reflux disease)     no current med.  . 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 Impingement syndrome of left shoulder region 10/2014  . Sleep apnea     uses CPAP nightly  . Wears partial dentures     upper  . Dental crowns present   . Edema of lower extremity     bilateral    Past Surgical History  Procedure Laterality Date  . Rotator cuff repair  2010  . Vaginal hysterectomy  2001    partial  . Shoulder arthroscopy with rotator cuff repair Right 10/04/2008  . Carpal tunnel release Left 04/25/2006  . Colonoscopy with propofol  10/09/2011  . Shoulder arthroscopy with subacromial decompression, rotator cuff repair and bicep tendon repair Left 11/04/2014    Procedure: LEFT SHOULDER ARTHROSCOPY WITH DEBRIDEMENT, DISTAL CLAVICLE EXCISION, ACROMIOPLASTY, ROTATOR CUFF REPAIR AND BICEP TENODESIS;  Surgeon: Kathryne Hitch, MD;  Location: [facility];  Service: Orthopedics;  Laterality: Left;  . Resection distal clavical Left 11/04/2014    Procedure: RESECTION DISTAL CLAVICAL;  Surgeon: Kathryne Hitch, MD;  Location: [facility];  Service: Orthopedics;  Laterality: Left;    There were  no vitals filed for this visit.  Visit Diagnosis:  S/P rotator cuff repair  Shoulder weakness  Decreased range of motion of left shoulder      Subjective Assessment - 01/05/15 0806    Subjective I am always in pain. I am still not sleeping good. I am back at work and have good days and bad days.    Currently in Pain? Yes   Pain Score 2    Pain Location Shoulder   Pain Orientation Left   Pain Descriptors / Indicators Aching;Throbbing   Pain Type Surgical pain   Aggravating Factors  move a certain way, raising it up, donning shirt   Pain Relieving Factors rest            [facility] PT Assessment - 01/05/15 0811    ROM / Strength   AROM / PROM / Strength AROM   AROM   AROM Assessment Site Shoulder   Right/Left Shoulder Left   Left Shoulder Flexion 112 Degrees  AAROM with pulleys   Left Shoulder ABduction 92 Degrees  AAROM with pulleys   PROM   Right Shoulder External Rotation 65 Degrees   Left Shoulder Flexion 140 Degrees   Left Shoulder ABduction 125 Degrees   Left Shoulder Internal Rotation 70 Degrees                     [facility]  Adult PT Treatment/Exercise - 01/05/15 0808    Shoulder Exercises: Supine   Other Supine Exercises cane pullover x 20, press ups x 20   Other Supine Exercises supine left puch x 10, then left AROM in pain free ROM  from 45 degrees to 130 degrees of flexion x 10 as well as small circles from supine    Shoulder Exercises: Standing   External Rotation Strengthening;Left;20 reps;Theraband   Theraband Level (Shoulder External Rotation) Level 1 (Yellow)   Internal Rotation Strengthening;Left;20 reps;Theraband   Theraband Level (Shoulder Internal Rotation) Level 1 (Yellow)   Flexion Strengthening;Left;20 reps   Theraband Level (Shoulder Flexion) Level 1 (Yellow)   Extension Strengthening;Both;20 reps;Theraband   Theraband Level (Shoulder Extension) Level 2 (Red)   Row Strengthening;Both;20 reps;Theraband   Theraband Level (Shoulder Row)  Level 2 (Red);Level 3 (Green)   Other Standing Exercises UE ranger x 20 flexion x 10, small ROM horiz ab/add x 10   Other Standing Exercises wall slides x 10 with RUE assist x 10, wall ladder  flexion    Shoulder Exercises: Pulleys   Flexion 2 minutes   ABduction 2 minutes  cues to keep arms out to the side   Shoulder Exercises: ROM/Strengthening   UBE (Upper Arm Bike) Level 2 3 min for 3 min back   Cryotherapy   Number Minutes Cryotherapy 15 Minutes   Cryotherapy Location Shoulder   Type of Cryotherapy Other (comment)  vaso medium pressure 32 degrees   Manual Therapy   Manual Therapy Joint mobilization;Passive ROM   Joint Mobilization grade 3 A/P and iferior glides to left glenohumeral joint   Passive ROM followed by PROM left shoulder flexion, abduction, ER and IR to tolerance. Pt reports decreased pain after manual and was able to relax during treatment.                   PT Short Term Goals - 12/29/14 0846    PT SHORT TERM GOAL #1   Title Pt will be independent with basic HEP   Status Achieved   PT SHORT TERM GOAL #2   Title Pt will be able to achieve 120 deg scaption passively with min pain.     Status On-going   PT SHORT TERM GOAL #3   Title Pt's swelling will decrease to bearly visible in order to decrease pain and improve ROM   Status Achieved   PT SHORT TERM GOAL #4   Title Maintain elbow and wrist ROM and set goal for grip strength   Status Achieved           PT Long Term Goals - 12/29/14 0847    PT LONG TERM GOAL #1   Title Pt will be independent with advanced HEP   Status On-going   PT LONG TERM GOAL #2   Title Pt will have shoulder flexion and abduction srength 4/5 in order to start lifting light grocery bags and kitchen pots while cooking    Status On-going   PT LONG TERM GOAL #3   Title Pt's L shoulder AROM elevation to 130 deg to be able to safely reach to overhead cabinets     Status On-going   PT LONG TERM GOAL #4   Title Pt will be able  to activate mm quickly during rhythmic stabilization in order to return to driving safely    Status On-going   PT LONG TERM GOAL #5   Title Grip strength will improve to __43lb more_ for improved lifting, carrying objects.  Baseline 38 lbs (15 kg)   Status On-going               Plan - 01/05/15 1700    Clinical Impression Statement Pt is 8 weeks s/p RTC repair. Her PROM is improving.See objective measures. SHe continues to have difficulty with reaching activites in standing/ seated positions due to weakness. She reports compliance with HEP and slow strengthening progress. She does well with gravity eliminated positions and AROM. She tolerated manual without increased pain.    PT Next Visit Plan check new HEP, cont with PRE and ROM, manual, modailites        Problem List Patient Active Problem List   Diagnosis Date Noted  . Low back pain radiating to both legs 05/06/2012  . Leg length discrepancy 05/06/2012  . HYPERTENSION, BENIGN 04/11/2009  . LEG EDEMA, BILATERAL 10/08/2008  . 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 OVARIAN FAILURE 09/02/2007  . VITAMIN D DEFICIENCY 09/02/2007  . OBESITY, NOS 10/17/2006  . ANEMIA, IRON DEFICIENCY, UNSPEC. 10/17/2006  . CARPAL TUNNEL SYNDROME 10/17/2006    Dorene Ar, PTA 01/05/2015, 9:52 AM  [facility] [address] [place], [place], [address] Phone: [phone]   Fax:  [phone]

## 2015-01-12 ENCOUNTER — Ambulatory Visit: Payer: 59 | Admitting: Physical Therapy

## 2015-01-12 DIAGNOSIS — R29898 Other symptoms and signs involving the musculoskeletal system: Secondary | ICD-10-CM

## 2015-01-12 DIAGNOSIS — Z9889 Other specified postprocedural states: Secondary | ICD-10-CM

## 2015-01-12 DIAGNOSIS — Z4789 Encounter for other orthopedic aftercare: Secondary | ICD-10-CM | POA: Diagnosis not present

## 2015-01-12 DIAGNOSIS — M25612 Stiffness of left shoulder, not elsewhere classified: Secondary | ICD-10-CM

## 2015-01-12 DIAGNOSIS — M7989 Other specified soft tissue disorders: Secondary | ICD-10-CM

## 2015-01-12 DIAGNOSIS — M25512 Pain in left shoulder: Secondary | ICD-10-CM

## 2015-01-12 NOTE — Patient Instructions (Signed)
Advised patient to use an anchor (door, cabinet, hook) to reach for and do a sustained gentle stretch into flexion, abd/scaption.

## 2015-01-12 NOTE — Therapy (Signed)
[place], [place], [address] Phone: [phone]   Fax:  [phone]  Physical Therapy Treatment  Patient Details  Name: Andrea Montes MRN: [id number] Date of Birth: 02-Jun-1956 Referring Provider:  Seward Carol, MD  Encounter Date: 01/12/2015      PT End of Session - 01/12/15 0855    Visit Number 14   Number of Visits 26   Date for PT Re-Evaluation 03/09/15   PT Start Time 0818  late   PT Stop Time 0905   PT Time Calculation (min) 47 min   Activity Tolerance Patient limited by pain      Past Medical History  Diagnosis Date  . History of blood transfusion   . Vitamin D deficiency   . Migraines   . Anemia     no current med.  . DDD (degenerative disc disease), lumbar   . History of shingles   . GERD (gastroesophageal reflux disease)     no current med.  . 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 Impingement syndrome of left shoulder region 10/2014  . Sleep apnea     uses CPAP nightly  . Wears partial dentures     upper  . Dental crowns present   . Edema of lower extremity     bilateral    Past Surgical History  Procedure Laterality Date  . Rotator cuff repair  2010  . Vaginal hysterectomy  2001    partial  . Shoulder arthroscopy with rotator cuff repair Right 10/04/2008  . Carpal tunnel release Left 04/25/2006  . Colonoscopy with propofol  10/09/2011  . Shoulder arthroscopy with subacromial decompression, rotator cuff repair and bicep tendon repair Left 11/04/2014    Procedure: LEFT SHOULDER ARTHROSCOPY WITH DEBRIDEMENT, DISTAL CLAVICLE EXCISION, ACROMIOPLASTY, ROTATOR CUFF REPAIR AND BICEP TENODESIS;  Surgeon: Kathryne Hitch, MD;  Location: [facility];  Service: Orthopedics;  Laterality: Left;  . Resection distal clavical Left 11/04/2014    Procedure: RESECTION DISTAL CLAVICAL;  Surgeon: Kathryne Hitch, MD;  Location: [facility];   Service: Orthopedics;  Laterality: Left;    There were no vitals filed for this visit.  Visit Diagnosis:  S/P rotator cuff repair  Shoulder weakness  Decreased range of motion of left shoulder  Pain in joint, shoulder region, left  Swelling of limb      Subjective Assessment - 01/12/15 0821    Subjective Patient arr late, swollen, has diff lifting arm, lifting, working, sleeping and doing her hair.     Currently in Pain? Yes   Pain Score 2    Pain Location Shoulder   Pain Orientation Left   Pain Descriptors / Indicators Aching;Throbbing   Pain Type Chronic pain;Surgical pain   Pain Radiating Towards mid humerus   Pain Onset More than a month ago   Pain Frequency Intermittent            [facility] PT Assessment - 01/12/15 0830    AROM   Left Shoulder Flexion 90 Degrees   Left Shoulder ABduction 70 Degrees   Left Shoulder Internal Rotation 70 Degrees  reach to L hip   Left Shoulder External Rotation 30 Degrees  L side of head   PROM   Left Shoulder Flexion 140 Degrees   Left Shoulder ABduction 125 Degrees   Left Shoulder Internal Rotation 70 Degrees   Strength   Left Shoulder Flexion 3-/5   Left Shoulder ABduction 3-/5   Left Shoulder  Internal Rotation 4-/5   Left Shoulder External Rotation 3/5           [facility] Adult PT Treatment/Exercise - 01/12/15 0833    Shoulder Exercises: Supine   Other Supine Exercises supine cane chest press 1 lb and overhead , ER and abd with cane mod cues to perfomr correctly   Shoulder Exercises: ROM/Strengthening   UBE (Upper Arm Bike) Level 2 3 min for 3 min back   Ball on Wall flex, Abd  too painful, weak   Other ROM/Strengthening Exercises wall ladder x3 for flexion and abd focus on 30 sec hold and realxing scapula. pain with eccentiric lowering, achieved approx. 120 in each direction   Cryotherapy   Number Minutes Cryotherapy 15 Minutes   Cryotherapy Location Shoulder   Type of Cryotherapy Ice pack   Manual Therapy   Manual  Therapy Joint mobilization;Passive ROM   Joint Mobilization grade 3 A/P and iferior glides to left glenohumeral joint   Passive ROM PROM all planes            PT Education - 01/12/15 0855    Education provided Yes   Education Details stretching          PT Short Term Goals - 01/12/15 0839    PT SHORT TERM GOAL #1   Title Pt will be independent with basic HEP   Status Achieved   PT SHORT TERM GOAL #2   Title Pt will be able to achieve 120 deg scaption passively with min pain.     Status Achieved   PT SHORT TERM GOAL #3   Title Pt's swelling will decrease to bearly visible in order to decrease pain and improve ROM   Baseline fluctuates   Status Partially Met   PT SHORT TERM GOAL #4   Title Maintain elbow and wrist ROM and set goal for grip strength   Status Achieved           PT Long Term Goals - 01/12/15 0839    PT LONG TERM GOAL #1   Title Pt will be independent with advanced HEP   Status On-going   PT LONG TERM GOAL #2   Title Pt will have shoulder flexion and abduction srength 4/5 in order to start lifting light grocery bags and kitchen pots while cooking    Baseline 3-/5   Status On-going   PT LONG TERM GOAL #3   Title Pt's L shoulder AROM elevation to 130 deg to be able to safely reach to overhead cabinets     Status On-going   PT LONG TERM GOAL #4   Title Pt will be able to activate mm quickly during rhythmic stabilization in order to return to driving safely    Status On-going   PT LONG TERM GOAL #5   Title Grip strength will improve to __43lb more_ for improved lifting, carrying objects.    Baseline 38 lbs   Status On-going               Plan - 01/12/15 0165    Clinical Impression Statement Patient has been renewed for 8 more weeks of PT as she has significant weakness, limitations in AROM and pain/swelling.  She is making slow gains. She may benefit from Home TENS unit for improving rest and comfort at night.    Pt will benefit from skilled  therapeutic intervention in order to improve on the following deficits Decreased range of motion;Impaired flexibility;Increased edema;Decreased endurance;Postural dysfunction;Increased fascial restricitons;Obesity;Pain;Impaired UE functional use;Decreased scar  mobility;Decreased strength;Decreased activity tolerance   PT Frequency 2x / week   PT Duration 8 weeks   PT Treatment/Interventions Moist Heat;Cryotherapy;Electrical Stimulation;Neuromuscular re-education;Manual lymph drainage;Dry needling;Manual techniques;Ultrasound;Therapeutic exercise;Passive range of motion;Scar mobilization;Patient/family education;Therapeutic activities;ADLs/Self Care Home Management;Iontophoresis 72m/ml Dexamethasone;Taping;Vasopneumatic Device   PT Next Visit Plan cont pain min PRE and AROM/AAROM, modalities, ionto?   PT Home Exercise Plan as prev.    Consulted and Agree with Plan of Care Patient        Problem List Patient Active Problem List   Diagnosis Date Noted  . Low back pain radiating to both legs 05/06/2012  . Leg length discrepancy 05/06/2012  . HYPERTENSION, BENIGN 04/11/2009  . LEG EDEMA, BILATERAL 10/08/2008  . 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 OVARIAN FAILURE 09/02/2007  . VITAMIN D DEFICIENCY 09/02/2007  . OBESITY, NOS 10/17/2006  . ANEMIA, IRON DEFICIENCY, UNSPEC. 10/17/2006  . CARPAL TUNNEL SYNDROME 10/17/2006    Marvine Encalade 01/12/2015, 9:07 AM  [facility] [address]Phone: [phone]  Fax:  [phone] JRaeford Razor PT 01/12/2015 9:09 AM Phone: [phone]: [phone]

## 2015-01-19 ENCOUNTER — Ambulatory Visit: Payer: 59 | Attending: Orthopedic Surgery | Admitting: Physical Therapy

## 2015-01-19 DIAGNOSIS — Z9889 Other specified postprocedural states: Secondary | ICD-10-CM | POA: Insufficient documentation

## 2015-01-19 DIAGNOSIS — M7989 Other specified soft tissue disorders: Secondary | ICD-10-CM | POA: Diagnosis present

## 2015-01-19 DIAGNOSIS — M25612 Stiffness of left shoulder, not elsewhere classified: Secondary | ICD-10-CM

## 2015-01-19 DIAGNOSIS — M25512 Pain in left shoulder: Secondary | ICD-10-CM | POA: Insufficient documentation

## 2015-01-19 DIAGNOSIS — M7582 Other shoulder lesions, left shoulder: Secondary | ICD-10-CM | POA: Insufficient documentation

## 2015-01-19 DIAGNOSIS — R29898 Other symptoms and signs involving the musculoskeletal system: Secondary | ICD-10-CM | POA: Diagnosis present

## 2015-01-19 NOTE — Patient Instructions (Signed)

## 2015-01-19 NOTE — Therapy (Signed)
[place], [place], [address] Phone: [phone]   Fax:  [phone]  Physical Therapy Treatment  Patient Details  Name: Andrea Montes MRN: [id number] Date of Birth: September 01, 1955 Referring Provider:  Kathryne Hitch, MD  Encounter Date: 01/19/2015      PT End of Session - 01/19/15 0825    Visit Number 15   Number of Visits 26   Date for PT Re-Evaluation 03/09/15   PT Start Time 0805   PT Stop Time 0905   PT Time Calculation (min) 60 min      Past Medical History  Diagnosis Date  . History of blood transfusion   . Vitamin D deficiency   . Migraines   . Anemia     no current med.  . DDD (degenerative disc disease), lumbar   . History of shingles   . GERD (gastroesophageal reflux disease)     no current med.  . 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 Impingement syndrome of left shoulder region 10/2014  . Sleep apnea     uses CPAP nightly  . Wears partial dentures     upper  . Dental crowns present   . Edema of lower extremity     bilateral    Past Surgical History  Procedure Laterality Date  . Rotator cuff repair  2010  . Vaginal hysterectomy  2001    partial  . Shoulder arthroscopy with rotator cuff repair Right 10/04/2008  . Carpal tunnel release Left 04/25/2006  . Colonoscopy with propofol  10/09/2011  . Shoulder arthroscopy with subacromial decompression, rotator cuff repair and bicep tendon repair Left 11/04/2014    Procedure: LEFT SHOULDER ARTHROSCOPY WITH DEBRIDEMENT, DISTAL CLAVICLE EXCISION, ACROMIOPLASTY, ROTATOR CUFF REPAIR AND BICEP TENODESIS;  Surgeon: Kathryne Hitch, MD;  Location: [facility];  Service: Orthopedics;  Laterality: Left;  . Resection distal clavical Left 11/04/2014    Procedure: RESECTION DISTAL CLAVICAL;  Surgeon: Kathryne Hitch, MD;  Location: [facility];  Service: Orthopedics;  Laterality: Left;    There were no  vitals filed for this visit.  Visit Diagnosis:  S/P rotator cuff repair  Shoulder weakness  Decreased range of motion of left shoulder  Pain in joint, shoulder region, left  Swelling of limb      Subjective Assessment - 01/19/15 0825    Currently in Pain? Yes   Pain Score 3    Pain Location Shoulder   Pain Orientation Left   Pain Descriptors / Indicators Aching;Throbbing   Pain Type Chronic pain;Surgical pain   Pain Frequency Intermittent   Aggravating Factors  raising arm up   Pain Relieving Factors rest                         [facility] Adult PT Treatment/Exercise - 01/19/15 0804    Shoulder Exercises: Supine   External Rotation Strengthening;Both;20 reps;Theraband   Theraband Level (Shoulder External Rotation) Level 1 (Yellow)   Internal Rotation Strengthening;Both;20 reps;Theraband   Theraband Level (Shoulder Internal Rotation) Level 1 (Yellow)   Other Supine Exercises supine cane pressup 2# x 20 3# x 20, also pullovers 2# x 20, 3 # x 20  supine biceps curls 3# x 20, tricep press 1 # x 20   Other Supine Exercises supine 1# clocks 12 to 6, 3 to 9 x 20 each   Shoulder Exercises: ROM/Strengthening   UBE (Upper Arm Bike) Level 2 3 min  for 3 min back   Other ROM/Strengthening Exercises wall ladder flexion and scaption x 3 each, painful   Other ROM/Strengthening Exercises UE ranger flexion, scaption x 10 each, painful, standing cane x 10 each flexion, scaption, abduction    Modalities   Modalities Cryotherapy;Iontophoresis   Cryotherapy   Number Minutes Cryotherapy 15 Minutes   Cryotherapy Location Shoulder   Type of Cryotherapy Ice pack   Iontophoresis   Type of Iontophoresis Dexamethasone   Location lt shoulder   Dose 1.0 cc   Time 6 hours                  PT Short Term Goals - 01/12/15 6222    PT SHORT TERM GOAL #1   Title Pt will be independent with basic HEP   Status Achieved   PT SHORT TERM GOAL #2   Title Pt will be able to achieve  120 deg scaption passively with min pain.     Status Achieved   PT SHORT TERM GOAL #3   Title Pt's swelling will decrease to bearly visible in order to decrease pain and improve ROM   Baseline fluctuates   Status Partially Met   PT SHORT TERM GOAL #4   Title Maintain elbow and wrist ROM and set goal for grip strength   Status Achieved           PT Long Term Goals - 01/12/15 0839    PT LONG TERM GOAL #1   Title Pt will be independent with advanced HEP   Status On-going   PT LONG TERM GOAL #2   Title Pt will have shoulder flexion and abduction srength 4/5 in order to start lifting light grocery bags and kitchen pots while cooking    Baseline 3-/5   Status On-going   PT LONG TERM GOAL #3   Title Pt's L shoulder AROM elevation to 130 deg to be able to safely reach to overhead cabinets     Status On-going   PT LONG TERM GOAL #4   Title Pt will be able to activate mm quickly during rhythmic stabilization in order to return to driving safely    Status On-going   PT LONG TERM GOAL #5   Title Grip strength will improve to __43lb more_ for improved lifting, carrying objects.    Baseline 38 lbs   Status On-going               Plan - 01/19/15 9798    Clinical Impression Statement Pt given info on where to purchase Tens unit. Pt also began Ionto trial for pain and inflammation today on left shoulder. Most of treatment focused on strengthening in gravity eliminated position due to intolerance to gravity resisted positions.    PT Next Visit Plan cont pain min PRE and AROM/AAROM, modalities, Ionto        Problem List Patient Active Problem List   Diagnosis Date Noted  . Low back pain radiating to both legs 05/06/2012  . Leg length discrepancy 05/06/2012  . HYPERTENSION, BENIGN 04/11/2009  . LEG EDEMA, BILATERAL 10/08/2008  . 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 OVARIAN FAILURE 09/02/2007  . VITAMIN D DEFICIENCY 09/02/2007  . OBESITY, NOS 10/17/2006   . ANEMIA, IRON DEFICIENCY, UNSPEC. 10/17/2006  . CARPAL TUNNEL SYNDROME 10/17/2006    Dorene Ar, PTA 01/19/2015, 12:17 PM  [facility] [address] [place], [place], [address] Phone: [phone]   Fax:  [phone]

## 2015-01-21 ENCOUNTER — Ambulatory Visit: Payer: 59 | Admitting: Physical Therapy

## 2015-01-25 ENCOUNTER — Encounter: Payer: 59 | Admitting: Physical Therapy

## 2015-01-26 ENCOUNTER — Encounter: Payer: Self-pay | Admitting: Physical Therapy

## 2015-01-26 ENCOUNTER — Ambulatory Visit: Payer: 59 | Admitting: Physical Therapy

## 2015-01-26 DIAGNOSIS — M25612 Stiffness of left shoulder, not elsewhere classified: Secondary | ICD-10-CM

## 2015-01-26 DIAGNOSIS — R29898 Other symptoms and signs involving the musculoskeletal system: Secondary | ICD-10-CM

## 2015-01-26 DIAGNOSIS — Z9889 Other specified postprocedural states: Secondary | ICD-10-CM

## 2015-01-26 DIAGNOSIS — M7989 Other specified soft tissue disorders: Secondary | ICD-10-CM

## 2015-01-26 DIAGNOSIS — M25512 Pain in left shoulder: Secondary | ICD-10-CM

## 2015-01-26 NOTE — Therapy (Signed)
[place], [place], [address] Phone: [phone]   Fax:  [phone]  Physical Therapy Treatment  Patient Details  Name: Andrea Montes MRN: [id number] Date of Birth: 05/02/1956 Referring Provider:  Seward Carol, MD  Encounter Date: 01/26/2015      PT End of Session - 01/26/15 0904    Visit Number 16   Number of Visits 26   Date for PT Re-Evaluation 03/09/15   PT Start Time 0805   PT Stop Time 0904   PT Time Calculation (min) 59 min   Activity Tolerance Patient tolerated treatment well      Past Medical History  Diagnosis Date  . History of blood transfusion   . Vitamin D deficiency   . Migraines   . Anemia     no current med.  . DDD (degenerative disc disease), lumbar   . History of shingles   . GERD (gastroesophageal reflux disease)     no current med.  . 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 Impingement syndrome of left shoulder region 10/2014  . Sleep apnea     uses CPAP nightly  . Wears partial dentures     upper  . Dental crowns present   . Edema of lower extremity     bilateral    Past Surgical History  Procedure Laterality Date  . Rotator cuff repair  2010  . Vaginal hysterectomy  2001    partial  . Shoulder arthroscopy with rotator cuff repair Right 10/04/2008  . Carpal tunnel release Left 04/25/2006  . Colonoscopy with propofol  10/09/2011  . Shoulder arthroscopy with subacromial decompression, rotator cuff repair and bicep tendon repair Left 11/04/2014    Procedure: LEFT SHOULDER ARTHROSCOPY WITH DEBRIDEMENT, DISTAL CLAVICLE EXCISION, ACROMIOPLASTY, ROTATOR CUFF REPAIR AND BICEP TENODESIS;  Surgeon: Kathryne Hitch, MD;  Location: [place] [facility];  Service: Orthopedics;  Laterality: Left;  . Resection distal clavical Left 11/04/2014    Procedure: RESECTION DISTAL CLAVICAL;  Surgeon: Kathryne Hitch, MD;  Location: [place];   Service: Orthopedics;  Laterality: Left;    There were no vitals filed for this visit.  Visit Diagnosis:  S/P rotator cuff repair  Shoulder weakness  Decreased range of motion of left shoulder  Pain in joint, shoulder region, left  Swelling of limb      Subjective Assessment - 01/26/15 0809    Subjective Really tired today, missed last visit due to family event, called [name].  She states her pain is getting better everyday, still has soreness anterior L shoulder. Sees MD 02/01/15.    Currently in Pain? Yes   Pain Score 2    Pain Location Shoulder   Pain Orientation Left   Aggravating Factors  raising arm , sleeping on it, lifting   Pain Relieving Factors rest, ice, pillows            [facility] PT Assessment - 01/26/15 0811    AROM   Left Shoulder Flexion 110 Degrees   Left Shoulder ABduction 80 Degrees          [facility] Adult PT Treatment/Exercise - 01/26/15 0815    Shoulder Exercises: Supine   Protraction Strengthening;20 reps   Protraction Weight (lbs) 6   Horizontal ABduction Strengthening;Both;20 reps;Theraband   Theraband Level (Shoulder Horizontal ABduction) Level 3 (Green)   External Rotation Strengthening;Both;10 reps;Theraband   Theraband Level (Shoulder External Rotation) Level 3 (Green)   External Rotation Weight (lbs) cues for  proper ROM, substitution   Internal Rotation Strengthening;Left;20 reps;Theraband   Theraband Level (Shoulder Internal Rotation) Level 3 (Green)   Flexion Strengthening;10 reps   Theraband Level (Shoulder Flexion) Level 3 (Green)   ABduction Strengthening;Left;10 reps   Theraband Level (Shoulder ABduction) Level 3 (Green)   Other Supine Exercises manual resist by PT: ER/IR, flex/ext and h. add/abd (rhythmic stab) 2 rounds x 10 each    Shoulder Exercises: Standing   Flexion Strengthening;Left;10 reps   Shoulder Flexion Weight (lbs) 2   ABduction Strengthening;Left;10 reps;Weights   Shoulder ABduction Weight (lbs) 2   Row Left;20  reps;Weights   Row Weight (lbs) 3, then added triceps ext   Other Standing Exercises bicep curls with bar and 6lb cuff x 20    Shoulder Exercises: Pulleys   Flexion 3 minutes   ABduction 2 minutes   Shoulder Exercises: ROM/Strengthening   Other ROM/Strengthening Exercises supine cane 3 lbs weight overhead, chest press 6 lbs x20    Cryotherapy   Number Minutes Cryotherapy 10 Minutes   Cryotherapy Location Shoulder   Type of Cryotherapy Ice pack   Iontophoresis   Type of Iontophoresis Dexamethasone   Location lt shoulder   Dose 1.0 cc   Time 6 hours   Manual Therapy   Manual Therapy Soft tissue mobilization   Manual therapy comments to anterior deltoid, mid delt and pec release, mod pressure tolerated   Soft tissue mobilization see above   Passive ROM PROM all planes                PT Education - 01/26/15 0904    Education provided No          PT Short Term Goals - 01/26/15 0856    PT SHORT TERM GOAL #1   Title Pt will be independent with basic HEP   Status Achieved   PT SHORT TERM GOAL #2   Title Pt will be able to achieve 120 deg scaption passively with min pain.     Status Achieved   PT SHORT TERM GOAL #3   Title Pt's swelling will decrease to bearly visible in order to decrease pain and improve ROM   Status Achieved   PT SHORT TERM GOAL #4   Title Maintain elbow and wrist ROM and set goal for grip strength   Status Achieved           PT Long Term Goals - 01/26/15 0856    PT LONG TERM GOAL #1   Title Pt will be independent with advanced HEP   Status On-going   PT LONG TERM GOAL #2   Title Pt will have shoulder flexion and abduction srength 4/5 in order to start lifting light grocery bags and kitchen pots while cooking    Status On-going   PT LONG TERM GOAL #3   Title Pt's L shoulder AROM elevation to 130 deg to be able to safely reach to overhead cabinets     Status On-going   PT LONG TERM GOAL #4   Title Pt will be able to activate mm quickly  during rhythmic stabilization in order to return to driving safely    Status On-going   PT LONG TERM GOAL #5   Title Grip strength will improve to __43lb more_ for improved lifting, carrying objects.    Status On-going               Plan - 01/26/15 0848    Clinical Impression Statement Patient with improved AROM against gravity  and was able to strengthen without lasting pain. Repeated Ionto today, added in soft tissue to ease soreness that seems persistent.  Fascia restricted in anterior shoulder        Problem List Patient Active Problem List   Diagnosis Date Noted  . Low back pain radiating to both legs 05/06/2012  . Leg length discrepancy 05/06/2012  . HYPERTENSION, BENIGN 04/11/2009  . LEG EDEMA, BILATERAL 10/08/2008  . 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 OVARIAN FAILURE 09/02/2007  . VITAMIN D DEFICIENCY 09/02/2007  . OBESITY, NOS 10/17/2006  . ANEMIA, IRON DEFICIENCY, UNSPEC. 10/17/2006  . CARPAL TUNNEL SYNDROME 10/17/2006    Graison Leinberger 01/26/2015, 9:06 AM  [facility] [address] [place], [place], [address] Phone: [phone]   Fax:  [phone]     Raeford Razor, PT 01/26/2015 9:09 AM Phone: [phone] Fax: [phone]

## 2015-01-28 ENCOUNTER — Ambulatory Visit: Payer: 59 | Admitting: Physical Therapy

## 2015-01-28 DIAGNOSIS — Z9889 Other specified postprocedural states: Secondary | ICD-10-CM

## 2015-01-28 DIAGNOSIS — M25512 Pain in left shoulder: Secondary | ICD-10-CM

## 2015-01-28 DIAGNOSIS — R29898 Other symptoms and signs involving the musculoskeletal system: Secondary | ICD-10-CM

## 2015-01-28 DIAGNOSIS — M25612 Stiffness of left shoulder, not elsewhere classified: Secondary | ICD-10-CM

## 2015-01-28 DIAGNOSIS — M7989 Other specified soft tissue disorders: Secondary | ICD-10-CM

## 2015-01-28 NOTE — Therapy (Signed)
[place], [place], [address] Phone: [phone]   Fax:  [phone]  Physical Therapy Treatment  Patient Details  Name: Andrea Montes MRN: [id number] Date of Birth: July 07, 1956 Referring Provider:  Seward Carol, MD  Encounter Date: 01/28/2015      PT End of Session - 01/28/15 0841    Visit Number 16   Number of Visits 26   Date for PT Re-Evaluation 03/09/15   PT Start Time 0800   PT Stop Time 0109   PT Time Calculation (min) 55 min      Past Medical History  Diagnosis Date  . History of blood transfusion   . Vitamin D deficiency   . Migraines   . Anemia     no current med.  . DDD (degenerative disc disease), lumbar   . History of shingles   . GERD (gastroesophageal reflux disease)     no current med.  . 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 Impingement syndrome of left shoulder region 10/2014  . Sleep apnea     uses CPAP nightly  . Wears partial dentures     upper  . Dental crowns present   . Edema of lower extremity     bilateral    Past Surgical History  Procedure Laterality Date  . Rotator cuff repair  2010  . Vaginal hysterectomy  2001    partial  . Shoulder arthroscopy with rotator cuff repair Right 10/04/2008  . Carpal tunnel release Left 04/25/2006  . Colonoscopy with propofol  10/09/2011  . Shoulder arthroscopy with subacromial decompression, rotator cuff repair and bicep tendon repair Left 11/04/2014    Procedure: LEFT SHOULDER ARTHROSCOPY WITH DEBRIDEMENT, DISTAL CLAVICLE EXCISION, ACROMIOPLASTY, ROTATOR CUFF REPAIR AND BICEP TENODESIS;  Surgeon: Kathryne Hitch, MD;  Location: [facility];  Service: Orthopedics;  Laterality: Left;  . Resection distal clavical Left 11/04/2014    Procedure: RESECTION DISTAL CLAVICAL;  Surgeon: Kathryne Hitch, MD;  Location: [facility];  Service: Orthopedics;  Laterality: Left;    There were  no vitals filed for this visit.  Visit Diagnosis:  S/P rotator cuff repair  Shoulder weakness  Decreased range of motion of left shoulder  Swelling of limb  Pain in joint, shoulder region, left      Subjective Assessment - 01/28/15 0807    Subjective This is my last scheduled appointment. I see MD 02/01/15. I feel like my shoulder is a whole lot stronger than it has been. I still cannot raise my arm out to the side.    Currently in Pain? No/denies   Aggravating Factors  raising arm to side, sleeping on it.    Pain Relieving Factors rest, ice, pillows   Effect of Pain on Daily Activities rest, ice, pillows            [facility] PT Assessment - 01/28/15 0811    ROM / Strength   AROM / PROM / Strength --  L grip 23 Kg, Rt 9kg due to trigger finger   AROM   Left Shoulder Flexion 115 Degrees   Left Shoulder ABduction 90 Degrees   Left Shoulder Internal Rotation 70 Degrees  reach to L hip   Left Shoulder External Rotation 35 Degrees  L side of head   PROM   Right Shoulder External Rotation 45 Degrees   Left Shoulder Flexion 140 Degrees   Left Shoulder ABduction 132 Degrees   Left Shoulder Internal Rotation  75 Degrees   Strength   Left Shoulder Flexion 3+/5   Left Shoulder ABduction 3-/5   Left Shoulder Internal Rotation 4/5   Left Shoulder External Rotation 3+/5                     [facility] Adult PT Treatment/Exercise - 01/28/15 0833    Shoulder Exercises: Supine   Flexion 15 reps;Weights   Shoulder Flexion Weight (lbs) 2   Flexion Limitations puches and AROM    Shoulder Exercises: Sidelying   External Rotation Strengthening;Left;20 reps;Weights   External Rotation Weight (lbs) 2   Shoulder Exercises: Standing   ABduction Strengthening;Left;10 reps;Weights  Dc due to poor form    Other Standing Exercises UE ranger x 20 flexion x 10, small ROM horiz ab/add x 10   Shoulder Exercises: ROM/Strengthening   UBE (Upper Arm Bike) Level 2 3 min for 3 min back    Cryotherapy   Number Minutes Cryotherapy 15 Minutes   Cryotherapy Location Shoulder   Type of Cryotherapy Other (comment)  vasopneumatic device 32 degrees low pressure                  PT Short Term Goals - 01/26/15 0856    PT SHORT TERM GOAL #1   Title Pt will be independent with basic HEP   Status Achieved   PT SHORT TERM GOAL #2   Title Pt will be able to achieve 120 deg scaption passively with min pain.     Status Achieved   PT SHORT TERM GOAL #3   Title Pt's swelling will decrease to bearly visible in order to decrease pain and improve ROM   Status Achieved   PT SHORT TERM GOAL #4   Title Maintain elbow and wrist ROM and set goal for grip strength   Status Achieved           PT Long Term Goals - 01/28/15 2671    PT LONG TERM GOAL #1   Title Pt will be independent with advanced HEP   Status On-going   PT LONG TERM GOAL #2   Title Pt will have shoulder flexion and abduction srength 4/5 in order to start lifting light grocery bags and kitchen pots while cooking    Status On-going  3+/5, 3-/5   PT LONG TERM GOAL #3   Title Pt's L shoulder AROM elevation to 130 deg to be able to safely reach to overhead cabinets     Status On-going   PT LONG TERM GOAL #4   Title Pt will be able to activate mm quickly during rhythmic stabilization in order to return to driving safely    Time 8   Status Achieved   PT LONG TERM GOAL #5   Title Grip strength will improve to __43lb more_ for improved lifting, carrying objects.    Time 8   Status Achieved  49               Plan - 01/28/15 0825    Clinical Impression Statement Pt has returned to driving and reports using L UE for lifting 5lb or less including grocerry bags and pots. Her grip strength has improved to 49lbs. Her AROM is impoving slowly as well as her strength-see objective measures. LTG #4 #5 achieved. Pt seed MD soonto discuss continuing PT.    PT Next Visit Plan cont pain min PRE and AROM/AAROM,  modalities,Dc  Ionto due to rash, See what MD says.  Problem List Patient Active Problem List   Diagnosis Date Noted  . Low back pain radiating to both legs 05/06/2012  . Leg length discrepancy 05/06/2012  . HYPERTENSION, BENIGN 04/11/2009  . LEG EDEMA, BILATERAL 10/08/2008  . 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 OVARIAN FAILURE 09/02/2007  . VITAMIN D DEFICIENCY 09/02/2007  . OBESITY, NOS 10/17/2006  . ANEMIA, IRON DEFICIENCY, UNSPEC. 10/17/2006  . CARPAL TUNNEL SYNDROME 10/17/2006    Dorene Ar, PTA 01/28/2015, 8:42 AM  [facility] [address] [place], [place], [address] Phone: [phone]   Fax:  [phone]

## 2015-02-02 ENCOUNTER — Encounter (HOSPITAL_BASED_OUTPATIENT_CLINIC_OR_DEPARTMENT_OTHER): Payer: Self-pay | Admitting: *Deleted

## 2015-02-02 ENCOUNTER — Other Ambulatory Visit: Payer: Self-pay | Admitting: Physician Assistant

## 2015-02-02 NOTE — H&P (Signed)
  [facility] [address] [place], [place] [address] [phone] A Division of [facility]  Ninetta Lights, M.D.   Robert A. Noemi Chapel, M.D.   Faythe Casa, M.D.   Johnny Bridge, M.D.   Almedia Balls, M.D Ernesta Amble. Percell Miller, M.D.  Joseph Pierini, M.D.  Lanier Prude, M.D.    Verner Chol, M.D. Lovett Calender, PA- C  Mary L. Venida Jarvis, PA-C  Kirstin A. Shepperson, PA-C  [place], PA-C  [place], [place]   RE: Andrea, Montes                                [id number]      DOB: 1956/03/11 PROGRESS NOTE: 02-01-15 Andrea Montes comes in for follow up.  Three months out from left shoulder rotator cuff repair.  Completed the first phase of therapy reasonably well.  Second phase she is really being beleaguered by stiffness.  They have really not been able to progress with strengthening.  She is getting more and more adhesive capsulitis and has somewhat plateaued in terms of that improving.  She comes in to discuss options.   The other issue is a new problem in the right hand.  Increasing triggering right middle finger.  This has been mild in the past, but lately incredibly symptomatic for her.  No numbness.  No tingling.  No new injury.  She does do repetitive work.   History and general exam is reviewed.    EXAMINATION: Specifically, obvious triggering A1 pulley, right long finger.  Neurovascularly intact.  Left shoulder she only has about 60% passive motion with very tight end points.  Neurovascularly intact.    DISPOSITION:  1. In regards to her shoulder, I feel like she has plateaued and she would really be helped with manipulation to breakup scar tissue and then reinstitution of therapy for motion and strength.  We can give therapy more time, but she really has plateaued based on what she is telling me and looking at her therapy notes.  She would like to proceed with that.  We are going to do it in the near  future and then aggressive therapy thereafter.   2. The other issue is her right hand.  At the time we manipulate her left shoulder, I am going to do an A1 pulley release of the right long finger.  That procedure, risks, benefits and complications reviewed and she understands.  I will see her at the time of operative intervention.  I am hoping to let her go back to work after a couple of days after both of these are done.    Ninetta Lights, M.D.   Electronically verified by Ninetta Lights, M.D. DFM:jjh D 02-01-15 T 02-02-15

## 2015-02-02 NOTE — Progress Notes (Signed)
Patient here at [facility] 11-04-14 for shoulder surgery, had preop anesthesia consultation by Dr Marcie Bal due to BMI and OSA. Was cleared for surgery here and she brought her CPAP. She is having a shoulder manipulation 02-03-15 and she will bring her CPAP machine dos

## 2015-02-03 ENCOUNTER — Ambulatory Visit (HOSPITAL_BASED_OUTPATIENT_CLINIC_OR_DEPARTMENT_OTHER): Payer: 59 | Admitting: Anesthesiology

## 2015-02-03 ENCOUNTER — Encounter (HOSPITAL_BASED_OUTPATIENT_CLINIC_OR_DEPARTMENT_OTHER): Payer: Self-pay | Admitting: Anesthesiology

## 2015-02-03 ENCOUNTER — Ambulatory Visit (HOSPITAL_BASED_OUTPATIENT_CLINIC_OR_DEPARTMENT_OTHER)
Admission: RE | Admit: 2015-02-03 | Discharge: 2015-02-03 | Disposition: A | Discharge status: Home / Self Care | Payer: 59 | Source: Ambulatory Visit | Attending: Orthopedic Surgery | Admitting: Orthopedic Surgery

## 2015-02-03 ENCOUNTER — Encounter (HOSPITAL_BASED_OUTPATIENT_CLINIC_OR_DEPARTMENT_OTHER)
Admission: RE | Disposition: A | Discharge status: Home / Self Care | Payer: Self-pay | Source: Ambulatory Visit | Attending: Orthopedic Surgery

## 2015-02-03 DIAGNOSIS — M24612 Ankylosis, left shoulder: Secondary | ICD-10-CM | POA: Insufficient documentation

## 2015-02-03 DIAGNOSIS — G4733 Obstructive sleep apnea (adult) (pediatric): Secondary | ICD-10-CM | POA: Diagnosis not present

## 2015-02-03 DIAGNOSIS — M65332 Trigger finger, left middle finger: Secondary | ICD-10-CM | POA: Diagnosis not present

## 2015-02-03 HISTORY — DX: Adhesive capsulitis of unspecified shoulder: M75.00

## 2015-02-03 HISTORY — PX: TRIGGER FINGER RELEASE: SHX641

## 2015-02-03 HISTORY — PX: CLOSED MANIPULATION SHOULDER WITH STERIOD INJECTION: SHX5611

## 2015-02-03 LAB — POCT HEMOGLOBIN-HEMACUE: HEMOGLOBIN: 13.4 g/dL (ref 12.0–15.0)

## 2015-02-03 SURGERY — CLOSED MANIPULATION SHOULDER WITH STEROID INJECTION
Anesthesia: General | Laterality: Right

## 2015-02-03 MED ORDER — CHLORHEXIDINE GLUCONATE 4 % EX LIQD: 60.0000 mL | Freq: Once | CUTANEOUS | Status: DC

## 2015-02-03 MED ORDER — MIDAZOLAM HCL 2 MG/2ML IJ SOLN
INTRAMUSCULAR | Status: AC
  Filled 2015-02-03: qty 2

## 2015-02-03 MED ORDER — LIDOCAINE HCL (CARDIAC) 20 MG/ML IV SOLN
INTRAVENOUS | Status: DC | PRN
  Administered 2015-02-03: 50 mg via INTRAVENOUS

## 2015-02-03 MED ORDER — FENTANYL CITRATE (PF) 100 MCG/2ML IJ SOLN
INTRAMUSCULAR | Status: AC
  Filled 2015-02-03: qty 2

## 2015-02-03 MED ORDER — MIDAZOLAM HCL 2 MG/2ML IJ SOLN
1.0000 mg | INTRAMUSCULAR | Status: DC | PRN
  Administered 2015-02-03: 1 mg via INTRAVENOUS

## 2015-02-03 MED ORDER — HYDROMORPHONE HCL 1 MG/ML IJ SOLN
0.2500 mg | INTRAMUSCULAR | Status: DC | PRN
  Administered 2015-02-03 (×2): 0.5 mg via INTRAVENOUS

## 2015-02-03 MED ORDER — BUPIVACAINE-EPINEPHRINE (PF) 0.25% -1:200000 IJ SOLN
INTRAMUSCULAR | Status: AC
  Filled 2015-02-03: qty 30

## 2015-02-03 MED ORDER — OXYCODONE HCL 5 MG PO TABS
5.0000 mg | ORAL_TABLET | Freq: Once | ORAL | Status: AC | PRN
  Administered 2015-02-03: 5 mg via ORAL

## 2015-02-03 MED ORDER — FENTANYL CITRATE (PF) 100 MCG/2ML IJ SOLN
50.0000 ug | INTRAMUSCULAR | Status: DC | PRN
  Administered 2015-02-03: 50 ug via INTRAVENOUS

## 2015-02-03 MED ORDER — ONDANSETRON HCL 4 MG/2ML IJ SOLN: 4.0000 mg | Freq: Once | INTRAMUSCULAR | Status: DC | PRN

## 2015-02-03 MED ORDER — SUCCINYLCHOLINE CHLORIDE 20 MG/ML IJ SOLN
INTRAMUSCULAR | Status: DC | PRN
  Administered 2015-02-03: 100 mg via INTRAVENOUS

## 2015-02-03 MED ORDER — BUPIVACAINE HCL (PF) 0.5 % IJ SOLN
INTRAMUSCULAR | Status: AC
  Filled 2015-02-03: qty 30

## 2015-02-03 MED ORDER — BUPIVACAINE HCL (PF) 0.5 % IJ SOLN
INTRAMUSCULAR | Status: DC | PRN
  Administered 2015-02-03: 4 mL via INTRA_ARTICULAR
  Administered 2015-02-03: 7 mL

## 2015-02-03 MED ORDER — CEFAZOLIN SODIUM-DEXTROSE 2-3 GM-% IV SOLR
2.0000 g | INTRAVENOUS | Status: AC
  Administered 2015-02-03: 2 g via INTRAVENOUS

## 2015-02-03 MED ORDER — ONDANSETRON HCL 4 MG/2ML IJ SOLN
INTRAMUSCULAR | Status: DC | PRN
  Administered 2015-02-03: 4 mg via INTRAVENOUS

## 2015-02-03 MED ORDER — FENTANYL CITRATE (PF) 100 MCG/2ML IJ SOLN
INTRAMUSCULAR | Status: AC
  Filled 2015-02-03: qty 4

## 2015-02-03 MED ORDER — LACTATED RINGERS IV SOLN: INTRAVENOUS | Status: DC

## 2015-02-03 MED ORDER — SCOPOLAMINE 1 MG/3DAYS TD PT72: 1.0000 | MEDICATED_PATCH | Freq: Once | TRANSDERMAL | Status: DC | PRN

## 2015-02-03 MED ORDER — DEXAMETHASONE SODIUM PHOSPHATE 4 MG/ML IJ SOLN
INTRAMUSCULAR | Status: DC | PRN
  Administered 2015-02-03: 10 mg via INTRAVENOUS

## 2015-02-03 MED ORDER — ACETAMINOPHEN 160 MG/5ML PO SOLN: 960.0000 mg | Freq: Once | ORAL | Status: DC

## 2015-02-03 MED ORDER — OXYCODONE HCL 5 MG PO TABS
ORAL_TABLET | ORAL | Status: AC
  Filled 2015-02-03: qty 1

## 2015-02-03 MED ORDER — HYDROMORPHONE HCL 1 MG/ML IJ SOLN
INTRAMUSCULAR | Status: AC
  Filled 2015-02-03: qty 1

## 2015-02-03 MED ORDER — BUPIVACAINE-EPINEPHRINE (PF) 0.5% -1:200000 IJ SOLN
INTRAMUSCULAR | Status: AC
  Filled 2015-02-03: qty 30

## 2015-02-03 MED ORDER — METHYLPREDNISOLONE ACETATE 80 MG/ML IJ SUSP
INTRAMUSCULAR | Status: AC
  Filled 2015-02-03: qty 1

## 2015-02-03 MED ORDER — PROPOFOL 10 MG/ML IV BOLUS
INTRAVENOUS | Status: DC | PRN
  Administered 2015-02-03: 200 mg via INTRAVENOUS

## 2015-02-03 MED ORDER — METHYLPREDNISOLONE ACETATE 80 MG/ML IJ SUSP
INTRAMUSCULAR | Status: DC | PRN
  Administered 2015-02-03: 80 mg via INTRA_ARTICULAR

## 2015-02-03 MED ORDER — LACTATED RINGERS IV SOLN
INTRAVENOUS | Status: DC
  Administered 2015-02-03: 10:00:00 via INTRAVENOUS

## 2015-02-03 MED ORDER — CEFAZOLIN SODIUM-DEXTROSE 2-3 GM-% IV SOLR
INTRAVENOUS | Status: AC
  Filled 2015-02-03: qty 50

## 2015-02-03 MED ORDER — ACETAMINOPHEN 500 MG PO TABS: 1000.0000 mg | ORAL_TABLET | Freq: Once | ORAL | Status: DC

## 2015-02-03 SURGICAL SUPPLY — 56 items
BANDAGE ELASTIC 3 VELCRO ST LF (GAUZE/BANDAGES/DRESSINGS) ×3 IMPLANT
BLADE SURG 15 STRL LF DISP TIS (BLADE) ×2 IMPLANT
BLADE SURG 15 STRL SS (BLADE) ×3
BNDG CMPR 9X4 STRL LF SNTH (GAUZE/BANDAGES/DRESSINGS) ×2
BNDG COHESIVE 3X5 TAN STRL LF (GAUZE/BANDAGES/DRESSINGS) ×3 IMPLANT
BNDG ESMARK 4X9 LF (GAUZE/BANDAGES/DRESSINGS) ×1 IMPLANT
CORDS BIPOLAR (ELECTRODE) IMPLANT
COVER BACK TABLE 60X90IN (DRAPES) ×3 IMPLANT
COVER MAYO STAND STRL (DRAPES) ×3 IMPLANT
CUFF TOURNIQUET SINGLE 18IN (TOURNIQUET CUFF) ×1 IMPLANT
DRAPE EXTREMITY T 121X128X90 (DRAPE) ×3 IMPLANT
DRAPE SURG 17X23 STRL (DRAPES) ×3 IMPLANT
DRSG PAD ABDOMINAL 8X10 ST (GAUZE/BANDAGES/DRESSINGS) ×2 IMPLANT
DURAPREP 26ML APPLICATOR (WOUND CARE) ×3 IMPLANT
GAUZE SPONGE 4X4 12PLY STRL (GAUZE/BANDAGES/DRESSINGS) ×3 IMPLANT
GAUZE XEROFORM 1X8 LF (GAUZE/BANDAGES/DRESSINGS) ×3 IMPLANT
GLOVE BIOGEL PI IND STRL 7.0 (GLOVE) ×2 IMPLANT
GLOVE BIOGEL PI IND STRL 8 (GLOVE) ×2 IMPLANT
GLOVE BIOGEL PI INDICATOR 7.0 (GLOVE) ×3
GLOVE BIOGEL PI INDICATOR 8 (GLOVE) ×1
GLOVE ECLIPSE 6.5 STRL STRAW (GLOVE) ×1 IMPLANT
GLOVE ECLIPSE 7.0 STRL STRAW (GLOVE) ×3 IMPLANT
GLOVE ORTHO TXT STRL SZ7.5 (GLOVE) ×4 IMPLANT
GLOVE SURG ORTHO 8.0 STRL STRW (GLOVE) ×3 IMPLANT
GOWN STRL REUS W/ TWL LRG LVL3 (GOWN DISPOSABLE) ×4 IMPLANT
GOWN STRL REUS W/ TWL XL LVL3 (GOWN DISPOSABLE) ×2 IMPLANT
GOWN STRL REUS W/TWL LRG LVL3 (GOWN DISPOSABLE) ×6
GOWN STRL REUS W/TWL XL LVL3 (GOWN DISPOSABLE) ×3
NDL HYPO 25X1 1.5 SAFETY (NEEDLE) ×2 IMPLANT
NDL SAFETY ECLIPSE 18X1.5 (NEEDLE) ×2 IMPLANT
NDL SPNL 22GX3.5 QUINCKE BK (NEEDLE) IMPLANT
NEEDLE HYPO 18GX1.5 SHARP (NEEDLE) ×3
NEEDLE HYPO 22GX1.5 SAFETY (NEEDLE) ×3 IMPLANT
NEEDLE HYPO 25X1 1.5 SAFETY (NEEDLE) ×3 IMPLANT
NEEDLE SPNL 22GX3.5 QUINCKE BK (NEEDLE) IMPLANT
NS IRRIG 1000ML POUR BTL (IV SOLUTION) ×3 IMPLANT
PACK BASIN DAY SURGERY FS (CUSTOM PROCEDURE TRAY) ×3 IMPLANT
PAD ALCOHOL SWAB (MISCELLANEOUS) ×6 IMPLANT
PAD CAST 3X4 CTTN HI CHSV (CAST SUPPLIES) ×4 IMPLANT
PADDING CAST ABS 3INX4YD NS (CAST SUPPLIES)
PADDING CAST ABS 4INX4YD NS (CAST SUPPLIES)
PADDING CAST ABS COTTON 3X4 (CAST SUPPLIES) ×2 IMPLANT
PADDING CAST ABS COTTON 4X4 ST (CAST SUPPLIES) ×2 IMPLANT
PADDING CAST COTTON 3X4 STRL (CAST SUPPLIES) ×3
SLING ARM FOAM STRAP LRG (SOFTGOODS) ×1 IMPLANT
SPLINT FIBERGLASS 3X35 (CAST SUPPLIES) ×2 IMPLANT
SPLINT PLASTER CAST XFAST 3X15 (CAST SUPPLIES) IMPLANT
SPLINT PLASTER XTRA FASTSET 3X (CAST SUPPLIES)
STOCKINETTE 4X48 STRL (DRAPES) ×3 IMPLANT
SUT ETHILON 3 0 PS 1 (SUTURE) ×3 IMPLANT
SWABSTICK POVIDONE IODINE SNGL (MISCELLANEOUS) IMPLANT
SYR 20CC LL (SYRINGE) ×3 IMPLANT
SYR BULB 3OZ (MISCELLANEOUS) ×3 IMPLANT
SYR CONTROL 10ML LL (SYRINGE) ×4 IMPLANT
TOWEL OR 17X24 6PK STRL BLUE (TOWEL DISPOSABLE) ×3 IMPLANT
UNDERPAD 30X30 (UNDERPADS AND DIAPERS) ×3 IMPLANT

## 2015-02-03 NOTE — Anesthesia Procedure Notes (Signed)
Procedure Name: Intubation Date/Time: 02/03/2015 10:13 AM Performed by: Lieutenant Diego Pre-anesthesia Checklist: Patient identified, Emergency Drugs available, Suction available and Patient being monitored Patient Re-evaluated:Patient Re-evaluated prior to inductionOxygen Delivery Method: Circle System Utilized Preoxygenation: Pre-oxygenation with 100% oxygen Intubation Type: IV induction Ventilation: Mask ventilation without difficulty Laryngoscope Size: Miller and 2 Grade View: Grade I Tube type: Oral Number of attempts: 1 Airway Equipment and Method: Stylet and Oral airway Placement Confirmation: ETT inserted through vocal cords under direct vision,  positive ETCO2 and breath sounds checked- equal and bilateral Secured at: 21 cm Tube secured with: Tape Dental Injury: Teeth and Oropharynx as per pre-operative assessment

## 2015-02-03 NOTE — Anesthesia Postprocedure Evaluation (Signed)
  Anesthesia Post-op Note  Patient: Andrea Montes  Procedure(s) Performed: Procedure(s): LEFT  MANIPULATION SHOULDER UNDER ANESTHESIA WITH INJECTION OF STEROID (Left) RIGHT LONG FINGER TRIGGER RELEASE (Right)  Patient Location: PACU  Anesthesia Type:General and GA combined with regional for post-op pain  Level of Consciousness: awake, alert  and oriented  Airway and Oxygen Therapy: Patient Spontanous Breathing and Patient connected to nasal cannula oxygen  Post-op Pain: none  Post-op Assessment: Post-op Vital signs reviewed, Patient's Cardiovascular Status Stable, Respiratory Function Stable, RESPIRATORY FUNCTION UNSTABLE, No signs of Nausea or vomiting and Pain level controlled              Post-op Vital Signs: stable  Last Vitals:  Filed Vitals:   02/03/15 1130  BP: 100/79  Pulse: 72  Temp:   Resp: 21    Complications: No apparent anesthesia complications

## 2015-02-03 NOTE — Discharge Instructions (Signed)
Care After  Left shoulder:  Wear sling until block has worn off.  Start physical therapy tomorrow.  Right trigger finger: Do not remove bandages.  Do not shower.  May ice for up to 20 minutes at a time for pain and swelling.  Follow up appointment in one week.  Refer to this sheet in the next few weeks. These discharge instructions provide you with general information on caring for yourself after you leave the hospital. Your  SEEK MEDICAL CARE IF: You have swelling of your calf or leg.  You develop shortness of breath or chest pain.  You have redness, swelling, or increasing pain in the wound.  There is pus or any unusual drainage coming from the surgical site.  You notice a bad smell coming from the surgical site or dressing.  The surgical site breaks open after sutures or staples have been removed.  There is persistent bleeding from the suture or staple line.  You are getting worse or are not improving.  You have any other questions or concerns.  SEEK IMMEDIATE MEDICAL CARE IF:  You have a fever.  You develop a rash.  You have difficulty breathing.  You develop any reaction or side effects to medicines given.  Your knee motion is decreasing rather than improving.  MAKE SURE YOU:  Understand these instructions.  Will watch your condition.  Will get help right away if you are not doing well or get worse.   Regional Anesthesia Blocks  1. Numbness or the inability to move the "blocked" extremity may last from 3-48 hours after placement. The length of time depends on the medication injected and your individual response to the medication. If the numbness is not going away after 48 hours, call your surgeon.  2. The extremity that is blocked will need to be protected until the numbness is gone and the  Strength has returned. Because you cannot feel it, you will need to take extra care to avoid injury. Because it may be weak, you may have difficulty moving it or using it. You may not know  what position it is in without looking at it while the block is in effect.  3. For blocks in the legs and feet, returning to weight bearing and walking needs to be done carefully. You will need to wait until the numbness is entirely gone and the strength has returned. You should be able to move your leg and foot normally before you try and bear weight or walk. You will need someone to be with you when you first try to ensure you do not fall and possibly risk injury.  4. Bruising and tenderness at the needle site are common side effects and will resolve in a few days.  5. Persistent numbness or new problems with movement should be communicated to the surgeon or the Tuesday [phone] [place] [phone]).   Post Anesthesia Home Care Instructions  Activity: Get plenty of rest for the remainder of the day. A responsible adult should stay with you for 24 hours following the procedure.  For the next 24 hours, DO NOT: -Drive a car -Paediatric nurse -Drink alcoholic beverages -Take any medication unless instructed by your physician -Make any legal decisions or sign important papers.  Meals: Start with liquid foods such as gelatin or soup. Progress to regular foods as tolerated. Avoid greasy, spicy, heavy foods. If nausea and/or vomiting occur, drink only clear liquids until the nausea and/or vomiting subsides. Call your  physician if vomiting continues.  Special Instructions/Symptoms: Your throat may feel dry or sore from the anesthesia or the breathing tube placed in your throat during surgery. If this causes discomfort, gargle with warm salt water. The discomfort should disappear within 24 hours.  If you had a scopolamine patch placed behind your ear for the management of post- operative nausea and/or vomiting:  1. The medication in the patch is effective for 72 hours, after which it should be removed.  Wrap patch in a tissue and discard in the trash.  Wash hands thoroughly with soap and water. 2. You may remove the patch earlier than 72 hours if you experience unpleasant side effects which may include dry mouth, dizziness or visual disturbances. 3. Avoid touching the patch. Wash your hands with soap and water after contact with the patch.

## 2015-02-03 NOTE — Interval H&P Note (Signed)
History and Physical Interval Note:  02/03/2015 7:28 AM  Andrea Montes  has presented today for surgery, with the diagnosis of ADHESIVE CAPSULITIS OF LEFT SHOULDER,TRIGGER FINGER,RIGHT MIDDLE FINGER  The various methods of treatment have been discussed with the patient and family. After consideration of risks, benefits and other options for treatment, the patient has consented to  Procedure(s): LEFT  MANIPULATION SHOULDER UNDER ANESTHESIA (Left) RIGHT LONG FINGER TRIGGER RELEASE (Right) as a surgical intervention .  The patient's history has been reviewed, patient examined, no change in status, stable for surgery.  I have reviewed the patient's chart and labs.  Questions were answered to the patient's satisfaction.     Dodge Ator F

## 2015-02-03 NOTE — Transfer of Care (Signed)
Immediate Anesthesia Transfer of Care Note  Patient: Andrea Montes  Procedure(s) Performed: Procedure(s): LEFT  MANIPULATION SHOULDER UNDER ANESTHESIA WITH INJECTION OF STEROID (Left) RIGHT LONG FINGER TRIGGER RELEASE (Right)  Patient Location: PACU  Anesthesia Type:General  Level of Consciousness: awake  Airway & Oxygen Therapy: Patient Spontanous Breathing and Patient connected to face mask oxygen  Post-op Assessment: Report given to RN and Post -op Vital signs reviewed and stable  Post vital signs: Reviewed and stable  Last Vitals:  Filed Vitals:   02/03/15 0955  BP: 116/70  Pulse:   Temp:   Resp: 18    Complications: No apparent anesthesia complications

## 2015-02-03 NOTE — Anesthesia Preprocedure Evaluation (Signed)
Anesthesia Evaluation  Patient identified by MRN, date of birth, ID band Patient awake    Reviewed: Allergy & Precautions, NPO status , Patient's Chart, lab work & pertinent test results  Airway Mallampati: II  TM Distance: >3 FB Neck ROM: Full    Dental  (+) Teeth Intact, Dental Advisory Given   Pulmonary  breath sounds clear to auscultation        Cardiovascular Rhythm:Regular Rate:Normal     Neuro/Psych    GI/Hepatic   Endo/Other    Renal/GU      Musculoskeletal   Abdominal   Peds  Hematology   Anesthesia Other Findings   Reproductive/Obstetrics                             Anesthesia Physical Anesthesia Plan  ASA: III  Anesthesia Plan: General   Post-op Pain Management:    Induction: Intravenous  Airway Management Planned: Oral ETT  Additional Equipment:   Intra-op Plan:   Post-operative Plan: Extubation in OR  Informed Consent:   Dental advisory given  Plan Discussed with: CRNA and Anesthesiologist  Anesthesia Plan Comments: (59 year old female S/P L. Shoulder  arthroscopy and decompression on 11/04/14 now with frozen shoulder Sleep apnea Obesity  Plan GA with interscalene block  [facility])        Anesthesia Quick Evaluation

## 2015-02-03 NOTE — Progress Notes (Signed)
Assisted Dr. Joslin with left, ultrasound guided, popliteal block. Side rails up, monitors on throughout procedure. See vital signs in flow sheet. Tolerated Procedure well. 

## 2015-02-04 ENCOUNTER — Encounter (HOSPITAL_BASED_OUTPATIENT_CLINIC_OR_DEPARTMENT_OTHER): Payer: Self-pay | Admitting: Orthopedic Surgery

## 2015-02-04 ENCOUNTER — Ambulatory Visit: Payer: 59 | Admitting: Physical Therapy

## 2015-02-04 DIAGNOSIS — Z9889 Other specified postprocedural states: Secondary | ICD-10-CM | POA: Diagnosis not present

## 2015-02-04 DIAGNOSIS — M7989 Other specified soft tissue disorders: Secondary | ICD-10-CM

## 2015-02-04 DIAGNOSIS — M25612 Stiffness of left shoulder, not elsewhere classified: Secondary | ICD-10-CM

## 2015-02-04 DIAGNOSIS — M25512 Pain in left shoulder: Secondary | ICD-10-CM

## 2015-02-04 DIAGNOSIS — R29898 Other symptoms and signs involving the musculoskeletal system: Secondary | ICD-10-CM

## 2015-02-04 NOTE — Therapy (Signed)
[place], [place], [address] Phone: [phone]   Fax:  [phone]  Physical Therapy Treatment/Re-Evaluation  Patient Details  Name: Andrea Montes MRN: [id number] Date of Birth: [birth year] Referring Provider:  Seward Carol, MD  Encounter Date: 02/04/2015      PT End of Session - 02/04/15 0730    Visit Number 17   Number of Visits 26   Date for PT Re-Evaluation 03/09/15   PT Start Time 0702   PT Stop Time 0755   PT Time Calculation (min) 53 min   Activity Tolerance Patient tolerated treatment well      Past Medical History  Diagnosis Date  . History of blood transfusion   . Vitamin D deficiency   . Migraines   . Anemia     no current med.  [name] History of shingles   . GERD (gastroesophageal reflux disease)     no current med.  . 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 Impingement syndrome of left shoulder region 10/2014  . Sleep apnea     uses CPAP nightly  . Wears partial dentures     upper  . Dental crowns present   . Edema of lower extremity     bilateral  . DDD (degenerative disc disease), lumbar   . Frozen shoulder     surgery 11-04-14    Past Surgical History  Procedure Laterality Date  . Rotator cuff repair  2010  . Vaginal hysterectomy  2001    partial  . Shoulder arthroscopy with rotator cuff repair Right 10/04/2008  . Carpal tunnel release Left 04/25/2006  . Colonoscopy with propofol  10/09/2011  . Shoulder arthroscopy with subacromial decompression, rotator cuff repair and bicep tendon repair Left 11/04/2014    Procedure: LEFT SHOULDER ARTHROSCOPY WITH DEBRIDEMENT, DISTAL CLAVICLE EXCISION, ACROMIOPLASTY, ROTATOR CUFF REPAIR AND BICEP TENODESIS;  Surgeon: Kathryne Hitch, MD;  Location: [facility];  Service: Orthopedics;  Laterality: Left;  . Resection distal clavical Left 11/04/2014    Procedure: RESECTION DISTAL CLAVICAL;  Surgeon: Kathryne Hitch, MD;  Location: [facility];  Service: Orthopedics;  Laterality: Left;    There were no vitals filed for this visit.  Visit Diagnosis:  S/P rotator cuff repair - Plan: PT plan of care cert/re-cert  Shoulder weakness - Plan: PT plan of care cert/re-cert  Decreased range of motion of left shoulder - Plan: PT plan of care cert/re-cert  Swelling of limb - Plan: PT plan of care cert/re-cert  Pain in joint, shoulder region, left - Plan: PT plan of care cert/re-cert      Subjective Assessment - 02/04/15 0705    Subjective Patient underwent left shoulder manipulation yesterday morning and right trigger finger release.  States her left shoulder is still a little numb and not overly sore.  Feels right hand bandage feels too tight so she is going to go by MD office  afterwards.     Currently in Pain? Yes   Pain Score 4    Pain Location Shoulder   Pain Orientation Left   Pain Type Surgical pain            [facility] PT Assessment - 02/04/15 0711    AROM   Left Shoulder Flexion 115 Degrees   Left Shoulder ABduction 85 Degrees   Left Shoulder Internal Rotation 60 Degrees   Left Shoulder External Rotation --  behind back to L4-5  [facility] Adult PT Treatment/Exercise - 02/04/15 0722    Shoulder Exercises: Supine   External Rotation AAROM;Left;15 reps  UE Ranger   Flexion AAROM;Left;15 reps  UE Ranger   Shoulder Exercises: Seated   Row AAROM;Left;10 reps  UE Ranger   Shoulder Exercises: Standing   Flexion AAROM;Left;10 reps;Other (comment)   Theraband Level (Shoulder Flexion) --  UE Ranger wall mount   Other Standing Exercises UE Ranger on floor, 2 steps and mat table 10x each   Other Standing Exercises NMR supraspinatus 15x UE Ranger   Shoulder Exercises: ROM/Strengthening   Other ROM/Strengthening Exercises Ladder walk 10x 2-3 rungs from top   Cryotherapy   Number Minutes Cryotherapy 10 Minutes   Cryotherapy Location Shoulder    Type of Cryotherapy Ice pack                  PT Short Term Goals - 02/04/15 6811    PT SHORT TERM GOAL #1   Title Pt will be independent with basic HEP   Status Achieved   PT SHORT TERM GOAL #2   Title Pt will be able to achieve 120 deg scaption passively with min pain.     Status Achieved   PT SHORT TERM GOAL #3   Title Pt's swelling will decrease to bearly visible in order to decrease pain and improve ROM   Status Achieved   PT SHORT TERM GOAL #4   Title Maintain elbow and wrist ROM and set goal for grip strength   Status Achieved           PT Long Term Goals - 02/04/15 5726    PT LONG TERM GOAL #1   Title Pt will be independent with advanced HEP   Time 4   Period Weeks   Status On-going   PT LONG TERM GOAL #2   Title Pt will have shoulder flexion and abduction srength 4/5 in order to start lifting light grocery bags and kitchen pots while cooking    Time 4   Period Weeks   Status On-going   PT LONG TERM GOAL #3   Title Pt's L shoulder AROM elevation to 130 deg to be able to safely reach to overhead cabinets     Time 4   Period Weeks   Status On-going   PT LONG TERM GOAL #4   Title Pt will be able to activate mm quickly during rhythmic stabilization in order to return to driving safely    Status Achieved   PT LONG TERM GOAL #5   Title Grip strength will improve to __43lb more_ for improved lifting, carrying objects.    Status Achieved   Additional Long Term Goals   Additional Long Term Goals Yes   PT LONG TERM GOAL #6   Title Patient will have improved ER to 70 degrees and IR behind back to L1 needed for fixing her hair and dressing tasks   Time 4   Period Weeks   Status New               Plan - 02/04/15 0732    Clinical Impression Statement Patient s/p left shoulder manipulation and right trigger finger release yesterday on 02/03/15.  The patient states her left shoulder not hurting much but her right hand/finger is hurting.  Patient reports  today's ROM is the best she has ever had.  Passively  her ROM  is 150 degrees flexion and abduction, ER to 70 degrees.  Actively she is limited by decreased  strength and compensatory shoulder hike:  flex 115, abd 85.  Patient states she "feels wonderful"  today although pain block from yesterday may still be controlling her pain level.  Patient states the doctor wants her to have PT 5x/week.  She plans to go back to work Monday and will need early AM appts.  Continue PT for progressive PROM, AAROM to full range and strengthening in newly found ROM.     Pt will benefit from skilled therapeutic intervention in order to improve on the following deficits Decreased range of motion;Impaired flexibility;Increased edema;Decreased endurance;Postural dysfunction;Increased fascial restricitons;Obesity;Pain;Impaired UE functional use;Decreased scar mobility;Decreased strength;Decreased activity tolerance   PT Frequency 5x / week   PT Duration 4 weeks   PT Treatment/Interventions Moist Heat;Cryotherapy;Electrical Stimulation;Neuromuscular re-education;Manual lymph drainage;Dry needling;Manual techniques;Ultrasound;Therapeutic exercise;Passive range of motion;Scar mobilization;Patient/family education;Therapeutic activities;ADLs/Self Care Home Management;Iontophoresis 4mg /ml Dexamethasone;Taping;Vasopneumatic Device   PT Next Visit Plan Continue with manual techniques as needed; progressive AAROM/AROM and strengthening to decrease shoulder hike with reaching        Problem List Patient Active Problem List   Diagnosis Date Noted  . Low back pain radiating to both legs 05/06/2012  . Leg length discrepancy 05/06/2012  . HYPERTENSION, BENIGN 04/11/2009  . LEG EDEMA, BILATERAL 10/08/2008  . 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 OVARIAN FAILURE 09/02/2007  . VITAMIN D DEFICIENCY 09/02/2007  . OBESITY, NOS 10/17/2006  . ANEMIA, IRON DEFICIENCY, UNSPEC. 10/17/2006  . CARPAL TUNNEL SYNDROME  10/17/2006    Alvera Singh 02/04/2015, 9:20 AM  [facility] [address] [place], [place], [address] Phone: [phone]   Fax:  [phone]   Ruben Im, PT 02/04/2015 9:22 AM Phone: [phone] Fax: [phone]

## 2015-02-04 NOTE — Op Note (Signed)
Andrea Montes, Andrea Montes NO.:  [id number]  MEDICAL RECORD NO.:  [id number]  LOCATION:                                FACILITY:  [facility]  PHYSICIAN:  Ninetta Lights, M.D. DATE OF BIRTH:  28-Apr-1956  DATE OF PROCEDURE:  02/03/2015 DATE OF DISCHARGE:                              OPERATIVE REPORT   PREOPERATIVE DIAGNOSIS: 1. Left shoulder status post rotator cuff repair.  Early     arthrofibrosis for __________ therapy. 2. Marked triggering A1 pulley, right middle finger.  POSTOPERATIVE DIAGNOSIS: 1. Left shoulder status post rotator cuff repair.  Early     arthrofibrosis for __________ therapy. 2. Marked triggering A1 pulley, right middle finger.  PROCEDURE: 1. Right shoulder exam under anesthesia with manipulation.     Subacromial injection Depo-Medrol Marcaine. 2. Release of A1 pulley, right long finger.  SURGEON:  Ninetta Lights, M.D.  ASSISTANT:  Elmyra Ricks, P.A.  ANESTHESIA:  General.  BLOOD LOSS:  Minimal.  SPECIMENS:  None.  CULTURES:  None.  COMPLICATIONS:  None.  DRESSING:  Soft compressive.  PROCEDURE IN DETAIL:  The patient was brought to the operating room; and after adequate anesthesia had been obtained, left shoulder examined. Lacking about 20% of motion from adhesions.  Gently manipulated, achieving full motion and stable shoulder without difficulty.  Injected subacromially Depo-Medrol Marcaine.  Attention was turned on the right.  Forearm tourniquet.  Prepped and draped in usual sterile fashion.  Exsanguinated with elevation of Esmarch.  Tourniquet inflated to 250 mmHg.  A small transverse incision made over the A1 pulley, long finger.  Skin and subcutaneous tissue divided.  Neurovascular structures identified and retracted.  A1 pulley incised in its entirety.  Some attrition on the tendon.  Obliterated all triggering.  A small portion of pulley excised.  Wound irrigated.  Closed with nylon.  Injected Marcaine. Sterile  compressive dressing applied.  Tourniquet deflated and removed. Anesthesia reversed.  Brought to the recovery room.  Tolerated the surgery well.  No complications.     Ninetta Lights, M.D.     DFM/MEDQ  D:  02/03/2015  T:  02/04/2015  Job:  [id number]

## 2015-02-07 ENCOUNTER — Encounter: Payer: 59 | Admitting: Physical Therapy

## 2015-02-08 ENCOUNTER — Ambulatory Visit: Payer: 59 | Admitting: Physical Therapy

## 2015-02-08 DIAGNOSIS — M7989 Other specified soft tissue disorders: Secondary | ICD-10-CM

## 2015-02-08 DIAGNOSIS — Z9889 Other specified postprocedural states: Secondary | ICD-10-CM | POA: Diagnosis not present

## 2015-02-08 DIAGNOSIS — R29898 Other symptoms and signs involving the musculoskeletal system: Secondary | ICD-10-CM

## 2015-02-08 DIAGNOSIS — M25512 Pain in left shoulder: Secondary | ICD-10-CM

## 2015-02-08 DIAGNOSIS — M25612 Stiffness of left shoulder, not elsewhere classified: Secondary | ICD-10-CM

## 2015-02-08 NOTE — Therapy (Addendum)
[place], [place], [address] Phone: [phone]   Fax:  [phone]  Physical Therapy Treatment  Patient Details  Name: Andrea Montes MRN: [id number] Date of Birth: Mar 19, 1956 Referring Provider:  Seward Carol, MD  Encounter Date: 02/08/2015      PT End of Session - 02/08/15 0847    Visit Number 18   Number of Visits 26   Date for PT Re-Evaluation 03/09/15   PT Start Time 0755   PT Stop Time 0855   PT Time Calculation (min) 60 min   Activity Tolerance Patient tolerated treatment well   Behavior During Therapy [facility] for tasks assessed/performed      Past Medical History  Diagnosis Date  . History of blood transfusion   . Vitamin D deficiency   . Migraines   . Anemia     no current med.  [name] History of shingles   . GERD (gastroesophageal reflux disease)     no current med.  . 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 Impingement syndrome of left shoulder region 10/2014  . Sleep apnea     uses CPAP nightly  . Wears partial dentures     upper  . Dental crowns present   . Edema of lower extremity     bilateral  . DDD (degenerative disc disease), lumbar   . Frozen shoulder     surgery 11-04-14    Past Surgical History  Procedure Laterality Date  . Rotator cuff repair  2010  . Vaginal hysterectomy  2001    partial  . Shoulder arthroscopy with rotator cuff repair Right 10/04/2008  . Carpal tunnel release Left 04/25/2006  . Colonoscopy with propofol  10/09/2011  . Shoulder arthroscopy with subacromial decompression, rotator cuff repair and bicep tendon repair Left 11/04/2014    Procedure: LEFT SHOULDER ARTHROSCOPY WITH DEBRIDEMENT, DISTAL CLAVICLE EXCISION, ACROMIOPLASTY, ROTATOR CUFF REPAIR AND BICEP TENODESIS;  Surgeon: Kathryne Hitch, MD;  Location: [facility];  Service: Orthopedics;  Laterality: Left;  . Resection distal clavical Left 11/04/2014   Procedure: RESECTION DISTAL CLAVICAL;  Surgeon: Kathryne Hitch, MD;  Location: [place];  Service: Orthopedics;  Laterality: Left;  . Closed manipulation shoulder with steriod injection Left 02/03/2015    Procedure: LEFT  MANIPULATION SHOULDER UNDER ANESTHESIA WITH INJECTION OF STEROID;  Surgeon: Kathryne Hitch, MD;  Location: [place];  Service: Orthopedics;  Laterality: Left;  . Trigger finger release Right 02/03/2015    Procedure: RIGHT LONG FINGER TRIGGER RELEASE;  Surgeon: Kathryne Hitch, MD;  Location: [facility];  Service: Orthopedics;  Laterality: Right;    There were no vitals filed for this visit.  Visit Diagnosis:  Shoulder weakness  Decreased range of motion of left shoulder  Swelling of limb  Pain in joint, shoulder region, left      Subjective Assessment - 02/08/15 0751    Subjective Stiff only no pain sleeping good Back to work My arm is working like it never has.moved this far   Currently in Pain? No/denies                         [facility] Adult PT Treatment/Exercise - 02/08/15 0800    Shoulder Exercises: Supine   Protraction Strengthening   Protraction Limitations 5 LBS 2 sets 10   Horizontal ABduction Weight (lbs) 5 lbs.   Horizontal ABduction Limitations small movements   External Rotation PROM;AROM;AAROM;Left;10  reps   External Rotation Limitations 63 degrees AA ROM   Internal Rotation Strengthening   Theraband Level (Shoulder Internal Rotation) Level 3 (Green)  2 sets of 10   Flexion 10 reps   Flexion Limitations 160 degrees AROM   ABduction Limitations 127 degrees AAROM  LT   Shoulder Exercises: Standing   Other Standing Exercises UE RANGER multiple moves with focus on shoulder posture   Other Standing Exercises Wall ladder and reaching 10 reps    Cryotherapy   Number Minutes Cryotherapy 10 Minutes   Cryotherapy Location Shoulder   Type of Cryotherapy --  Cold pack                   PT Short Term Goals - 02/04/15 0017    PT SHORT TERM GOAL #1   Title Pt will be independent with basic HEP   Status Achieved   PT SHORT TERM GOAL #2   Title Pt will be able to achieve 120 deg scaption passively with min pain.     Status Achieved   PT SHORT TERM GOAL #3   Title Pt's swelling will decrease to bearly visible in order to decrease pain and improve ROM   Status Achieved   PT SHORT TERM GOAL #4   Title Maintain elbow and wrist ROM and set goal for grip strength   Status Achieved           PT Long Term Goals - 02/04/15 4944    PT LONG TERM GOAL #1   Title Pt will be independent with advanced HEP   Time 4   Period Weeks   Status On-going   PT LONG TERM GOAL #2   Title Pt will have shoulder flexion and abduction srength 4/5 in order to start lifting light grocery bags and kitchen pots while cooking    Time 4   Period Weeks   Status On-going   PT LONG TERM GOAL #3   Title Pt's L shoulder AROM elevation to 130 deg to be able to safely reach to overhead cabinets     Time 4   Period Weeks   Status On-going   PT LONG TERM GOAL #4   Title Pt will be able to activate mm quickly during rhythmic stabilization in order to return to driving safely    Status Achieved   PT LONG TERM GOAL #5   Title Grip strength will improve to __43lb more_ for improved lifting, carrying objects.    Status Achieved   Additional Long Term Goals   Additional Long Term Goals Yes   PT LONG TERM GOAL #6   Title Patient will have improved ER to 70 degrees and IR behind back to L1 needed for fixing her hair and dressing tasks   Time 4   Period Weeks   Status New               Plan - 02/08/15 0848    Clinical Impression Statement ROM improving .  She is back to work .[name] FLEXION  AA 160, ABD 127, ER 63,  IR 82 degrees.  Movements continue to have shoulder hike.     PT Next Visit Plan Continue with manual techniques as needed; progressive AAROM/AROM and strengthening to decrease shoulder  hike with reaching   Consulted and Agree with Plan of Care Patient        Problem List Patient Active Problem List   Diagnosis Date Noted  . Low back pain radiating to both  legs 05/06/2012  . Leg length discrepancy 05/06/2012  . HYPERTENSION, BENIGN 04/11/2009  . LEG EDEMA, BILATERAL 10/08/2008  . 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 OVARIAN FAILURE 09/02/2007  . VITAMIN D DEFICIENCY 09/02/2007  . OBESITY, NOS 10/17/2006  . ANEMIA, IRON DEFICIENCY, UNSPEC. 10/17/2006  . CARPAL TUNNEL SYNDROME 10/17/2006    [facility] 02/08/2015, 8:52 AM  [facility] [address] [place], [place], [address] Phone: [phone]   Fax:  [phone]  Melvenia Needles, PTA 02/08/2015 8:52 AM Phone: [phone] Fax: [phone]   PHYSICAL THERAPY DISCHARGE SUMMARY  Visits from Start of Care: 18  Current functional level related to goals / functional outcomes: Unknown, did not return   Remaining deficits: Unknown, see above for most recent info    Education / Equipment: HEP, RICE and posture Plan: Patient agrees to discharge.  Patient goals were partially met. Patient is being discharged due to not returning since the last visit.  ?????   Raeford Razor, PT 07/26/16 1:29 PM Phone: [phone] Fax: [phone]

## 2015-02-09 ENCOUNTER — Ambulatory Visit: Payer: 59 | Admitting: Physical Therapy

## 2015-02-10 ENCOUNTER — Ambulatory Visit: Payer: 59 | Admitting: Physical Therapy

## 2015-02-11 ENCOUNTER — Ambulatory Visit: Payer: 59 | Admitting: Physical Therapy

## 2015-02-11 ENCOUNTER — Encounter: Payer: 59 | Admitting: Physical Therapy

## 2015-02-14 ENCOUNTER — Ambulatory Visit: Payer: 59 | Admitting: Physical Therapy

## 2015-02-14 ENCOUNTER — Encounter: Payer: 59 | Admitting: Physical Therapy

## 2015-02-16 ENCOUNTER — Encounter: Payer: 59 | Admitting: Physical Therapy

## 2015-02-17 ENCOUNTER — Encounter: Payer: 59 | Admitting: Physical Therapy

## 2015-02-18 ENCOUNTER — Encounter: Payer: 59 | Admitting: Physical Therapy

## 2015-04-11 ENCOUNTER — Ambulatory Visit (INDEPENDENT_AMBULATORY_CARE_PROVIDER_SITE_OTHER): Payer: 59 | Admitting: Sports Medicine

## 2015-04-11 ENCOUNTER — Encounter: Payer: Self-pay | Admitting: Sports Medicine

## 2015-04-11 VITALS — BP 137/76 | HR 78 | Ht 63.0 in | Wt 255.0 lb

## 2015-04-11 DIAGNOSIS — M47816 Spondylosis without myelopathy or radiculopathy, lumbar region: Secondary | ICD-10-CM

## 2015-04-11 MED ORDER — MELOXICAM 15 MG PO TABS: 15.0000 mg | ORAL_TABLET | Freq: Every day | ORAL | Status: DC | PRN

## 2015-04-11 NOTE — Progress Notes (Signed)
   Subjective:    Patient ID: Andrea Montes, female    DOB: 1956/05/08, 59 y.o.   MRN: [id number]  HPI  Patient comes in today for her annual follow-up of chronic low back pain. She has a well-documented history of lumbar facet arthropathy. She has been treated in the past with medications and injections. Unfortunately she had some sort of allergic reaction to the last cortisone injection she had. She takes meloxicam as needed and this does seem to work. Pain is primarily diffuse across her low back. At times it is quite severe. She has trouble ambulating when it is bad. No numbness or tingling into her legs. She has seen Dr.Nudelman in the past who has recommended treating her conservatively without surgery. Pain is worse when going from seated to standing. No groin pain. No recent trauma.  Past medical history and surgical history reviewed. Interim surgical history significant for recent left shoulder arthroscopy for rotator cuff tear. She also had undergo a manipulation under anesthesia after developing a postoperative adhesive capsulitis. She is now doing much better in this regards.  Medications reviewed Allergies reviewed    Review of Systems     Objective:   Physical Exam Obese. No acute distress  Lumbar spine: Limited mobility secondary to pain particularly with extension. No spasm. No tenderness to palpation or percussion along the lumbar midline. No focal neurological deficit of either lower extremity.       Assessment & Plan:  Chronic low back pain secondary to advanced facet arthropathy  I will refill her meloxicam. She is not taking it daily. She only takes it when she needs it. I will update her FMLA paperwork. I've also provided her with a permanent handicap placard. Follow-up with me as needed.

## 2015-07-31 ENCOUNTER — Encounter (HOSPITAL_COMMUNITY): Payer: Self-pay | Admitting: Emergency Medicine

## 2015-07-31 ENCOUNTER — Emergency Department (HOSPITAL_COMMUNITY)
Admission: EM | Admit: 2015-07-31 | Discharge: 2015-07-31 | Disposition: A | Discharge status: Home / Self Care | Payer: 59 | Source: Home / Self Care

## 2015-07-31 DIAGNOSIS — N390 Urinary tract infection, site not specified: Secondary | ICD-10-CM

## 2015-07-31 LAB — POCT URINALYSIS DIP (DEVICE)
BILIRUBIN URINE: NEGATIVE
Glucose, UA: NEGATIVE mg/dL
HGB URINE DIPSTICK: NEGATIVE
Ketones, ur: NEGATIVE mg/dL
NITRITE: NEGATIVE
Protein, ur: NEGATIVE mg/dL
Specific Gravity, Urine: 1.025 (ref 1.005–1.030)
Urobilinogen, UA: 0.2 mg/dL (ref 0.0–1.0)
pH: 5.5 (ref 5.0–8.0)

## 2015-07-31 MED ORDER — SULFAMETHOXAZOLE-TRIMETHOPRIM 800-160 MG PO TABS: 1.0000 | ORAL_TABLET | Freq: Two times a day (BID) | ORAL | Status: AC

## 2015-07-31 NOTE — ED Provider Notes (Signed)
CSN: [id number]     Arrival date & time 07/31/15  1300 History   None    No chief complaint on file.  (Consider location/radiation/quality/duration/timing/severity/associated sxs/prior Treatment) HPI History obtained from patient:   LOCATION:bladder SEVERITY: DURATION:1 week CONTEXT:sudden onset QUALITY:burning MODIFYING FACTORS: ASSOCIATED SYMPTOMS: TIMING:constant OCCUPATION:cardiology tech  Past Medical History  Diagnosis Date  . History of blood transfusion   . Vitamin D deficiency   . Migraines   . Anemia     no current med.  [name] History of shingles   . GERD (gastroesophageal reflux disease)     no current med.  . 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 Impingement syndrome of left shoulder region 10/2014  . Sleep apnea     uses CPAP nightly  . Wears partial dentures     upper  . Dental crowns present   . Edema of lower extremity     bilateral  . DDD (degenerative disc disease), lumbar   . Frozen shoulder     surgery 11-04-14   Past Surgical History  Procedure Laterality Date  . Rotator cuff repair  2010  . Vaginal hysterectomy  2001    partial  . Shoulder arthroscopy with rotator cuff repair Right 10/04/2008  . Carpal tunnel release Left 04/25/2006  . Colonoscopy with propofol  10/09/2011  . Shoulder arthroscopy with subacromial decompression, rotator cuff repair and bicep tendon repair Left 11/04/2014    Procedure: LEFT SHOULDER ARTHROSCOPY WITH DEBRIDEMENT, DISTAL CLAVICLE EXCISION, ACROMIOPLASTY, ROTATOR CUFF REPAIR AND BICEP TENODESIS;  Surgeon: Kathryne Hitch, MD;  Location: [facility];  Service: Orthopedics;  Laterality: Left;  . Resection distal clavical Left 11/04/2014    Procedure: RESECTION DISTAL CLAVICAL;  Surgeon: Kathryne Hitch, MD;  Location: [place] [facility];  Service: Orthopedics;  Laterality: Left;  . Closed manipulation shoulder with steriod injection Left 02/03/2015    Procedure: LEFT  MANIPULATION  SHOULDER UNDER ANESTHESIA WITH INJECTION OF STEROID;  Surgeon: Kathryne Hitch, MD;  Location: [place];  Service: Orthopedics;  Laterality: Left;  . Trigger finger release Right 02/03/2015    Procedure: RIGHT LONG FINGER TRIGGER RELEASE;  Surgeon: Kathryne Hitch, MD;  Location: [place];  Service: Orthopedics;  Laterality: Right;   Family History  Problem Relation Age of Onset  . Kidney disease Brother   . Hypertension Brother   . Esophageal cancer Maternal Uncle   . Leukemia Mother   . Hypertension Brother    Social History  Substance Use Topics  . Smoking status: Never Smoker   . Smokeless tobacco: Never Used  . Alcohol Use: No   OB History    No data available     Review of Systems ROS +'vedysuria  Denies: HEADACHE, NAUSEA, ABDOMINAL PAIN, CHEST PAIN, CONGESTION,  SHORTNESS OF BREATH, Pregnancy (hyst)  Allergies  Review of patient's allergies indicates no known allergies.  Home Medications   Prior to Admission medications   Medication Sig Start Date End Date Taking? Authorizing Provider  cholecalciferol (VITAMIN D) 1000 UNITS tablet Take 50,000 Units by mouth once a week.     Historical Provider, MD  cyclobenzaprine (FLEXERIL) 10 MG tablet Take 1 tablet (10 mg total) by mouth 2 (two) times daily as needed for muscle spasms. 05/21/14   Carlos Levering Draper, DO  furosemide (LASIX) 20 MG tablet TAKE 1 TABLET BY MOUTH ONCE DAILY 05/18/14   Josue Hector, MD  meloxicam (MOBIC) 15 MG tablet Take 1 tablet (15 mg total)  by mouth daily as needed for pain. 04/11/15   Carlos Levering Draper, DO  ondansetron (ZOFRAN) 4 MG tablet Take 1 tablet (4 mg total) by mouth every 8 (eight) hours as needed for nausea or vomiting. 11/04/14   Aundra Dubin, PA-C  oxyCODONE-acetaminophen (ROXICET) 5-325 MG per tablet Take 1-2 tablets by mouth every 4 (four) hours as needed. Patient not taking: Reported on 04/11/2015 11/04/14   Aundra Dubin, PA-C   Meds Ordered and Administered  this Visit  Medications - No data to display  BP 159/80 mmHg  Pulse 60  Temp(Src) 98.2 F (36.8 C) (Oral)  SpO2 98% No data found.   Physical Exam  Constitutional: She is oriented to person, place, and time. She appears well-developed and well-nourished. No distress.  HENT:  Head: Normocephalic and atraumatic.  Eyes: Conjunctivae are normal.  Neck: Normal range of motion. Neck supple.  Cardiovascular: Normal rate.   Pulmonary/Chest: Effort normal.  Abdominal:  NO CVA-T  Neurological: She is alert and oriented to person, place, and time.  Skin: Skin is warm and dry.  Psychiatric: She has a normal mood and affect. Her behavior is normal. Judgment and thought content normal.  Nursing note and vitals reviewed.   ED Course  Procedures (including critical care time)  Labs Review Labs Reviewed  POCT URINALYSIS DIP (DEVICE) - Abnormal; Notable for the following:    Leukocytes, UA SMALL (*)    All other components within normal limits    Imaging Review No results found.   Visual Acuity Review  Right Eye Distance:   Left Eye Distance:   Bilateral Distance:    Right Eye Near:   Left Eye Near:    Bilateral Near:         MDM  No diagnosis found. Although UA is not overwhelming, I have discussed plan of care with patient and she would prefer an antibx.  Patient is also advised to have her blood pressure rechecked by a staff member at her office sometime this week just to make sure that is getting better. The blood pressure remains high she should see her primary care provider for follow-up sooner.  Bactrim DX is provided Instructions of care provided discharged home in stable condition THIS NOTE WAS GENERATED USING A VOICE RECOGNITION SOFTWARE PROGRAM. ALL REASONABLE EFFORTS  WERE MADE TO PROOFREAD THIS DOCUMENT FOR ACCURACY.     Konrad Felix, [place] 07/31/15 1453

## 2015-07-31 NOTE — ED Notes (Signed)
Pt discharged by Lucita Lora, PA

## 2015-07-31 NOTE — Discharge Instructions (Signed)

## 2015-07-31 NOTE — ED Notes (Signed)
C/o UTI sx onset x1 week Sx include urinary freq/urgency, back pain, and lower abd pain Denies fevers, n/v/d A&O x4... No acute distress.

## 2015-08-29 DIAGNOSIS — Z01419 Encounter for gynecological examination (general) (routine) without abnormal findings: Secondary | ICD-10-CM | POA: Diagnosis not present

## 2015-08-29 DIAGNOSIS — R350 Frequency of micturition: Secondary | ICD-10-CM | POA: Diagnosis not present

## 2015-09-09 ENCOUNTER — Ambulatory Visit (INDEPENDENT_AMBULATORY_CARE_PROVIDER_SITE_OTHER): Payer: 59 | Admitting: Family Medicine

## 2015-09-09 ENCOUNTER — Other Ambulatory Visit (INDEPENDENT_AMBULATORY_CARE_PROVIDER_SITE_OTHER): Payer: 59

## 2015-09-09 ENCOUNTER — Encounter: Payer: Self-pay | Admitting: Family Medicine

## 2015-09-09 VITALS — BP 122/84 | HR 71 | Ht 63.0 in | Wt 274.0 lb

## 2015-09-09 DIAGNOSIS — M65341 Trigger finger, right ring finger: Secondary | ICD-10-CM | POA: Insufficient documentation

## 2015-09-09 DIAGNOSIS — M6530 Trigger finger, unspecified finger: Secondary | ICD-10-CM

## 2015-09-09 NOTE — Assessment & Plan Note (Signed)
Patient given injection today and tolerated the procedure well. We discussed icing regimen and home exercises. We discussed which activities to do an which ones to potentially avoid. We discussed bracing at night and patient was given a brace. Patient and will come back and see me again in 3 weeks for further evaluation if not improved. Discuss we can repeat injection every 3-4 months.

## 2015-09-09 NOTE — Progress Notes (Signed)
Pre visit review using our clinic review tool, if applicable. No additional management support is needed unless otherwise documented below in the visit note. 

## 2015-09-09 NOTE — Patient Instructions (Signed)
Good to see you Ice 20 minutes 2 times daily. Usually after activity and before bed. Wear brace next 48 hours then nightly for a week pennsaid pinkie amount topically 2 times daily as needed.  Ask your PCP about some inflammatory markers See me again if you want me to help the back or can repeat injection in 3 months.

## 2015-09-09 NOTE — Progress Notes (Signed)
[facility] [address] [place], [place] [address] Phone: [phone] Subjective:    I'm seeing this patient by the request  of:  POLITE,RONALD D, MD   CC: Left middle finger pain  [id number] Andrea Montes is a 60 y.o. female coming in with complaint of left finger pain. Patient states that she has had this pain for quite some time. Possibly weeks to months. States that there is a triggering. Can get stuck in a locked position. Severely painful. He had one on the contralateral side and did have an injection approximately 1 month ago. States that that one did very well. Describes it otherwise as fairly normal but may be having some mild weakness in strength. No numbness that is constant. Rates the severity of pain as 9 out of 10 when it does occur with the popping. No injury that she can think of.     Past Medical History  Diagnosis Date  . History of blood transfusion   . Vitamin D deficiency   . Migraines   . Anemia     no current med.  [name] History of shingles   . GERD (gastroesophageal reflux disease)     no current med.  . 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 Impingement syndrome of left shoulder region 10/2014  . Sleep apnea     uses CPAP nightly  . Wears partial dentures     upper  . Dental crowns present   . Edema of lower extremity     bilateral  . DDD (degenerative disc disease), lumbar   . Frozen shoulder     surgery 11-04-14   Past Surgical History  Procedure Laterality Date  . Rotator cuff repair  2010  . Vaginal hysterectomy  2001    partial  . Shoulder arthroscopy with rotator cuff repair Right 10/04/2008  . Carpal tunnel release Left 04/25/2006  . Colonoscopy with propofol  10/09/2011  . Shoulder arthroscopy with subacromial decompression, rotator cuff repair and bicep tendon repair Left 11/04/2014    Procedure: LEFT SHOULDER ARTHROSCOPY WITH DEBRIDEMENT, DISTAL CLAVICLE EXCISION, ACROMIOPLASTY,  ROTATOR CUFF REPAIR AND BICEP TENODESIS;  Surgeon: Kathryne Hitch, MD;  Location: [facility];  Service: Orthopedics;  Laterality: Left;  . Resection distal clavical Left 11/04/2014    Procedure: RESECTION DISTAL CLAVICAL;  Surgeon: Kathryne Hitch, MD;  Location: [place];  Service: Orthopedics;  Laterality: Left;  . Closed manipulation shoulder with steriod injection Left 02/03/2015    Procedure: LEFT  MANIPULATION SHOULDER UNDER ANESTHESIA WITH INJECTION OF STEROID;  Surgeon: Kathryne Hitch, MD;  Location: [facility];  Service: Orthopedics;  Laterality: Left;  . Trigger finger release Right 02/03/2015    Procedure: RIGHT LONG FINGER TRIGGER RELEASE;  Surgeon: Kathryne Hitch, MD;  Location: [facility];  Service: Orthopedics;  Laterality: Right;   Social History   Social History  . Marital Status: Legally Separated    Spouse Name: N/A  . Number of Children: N/A  . Years of Education: N/A   Social History Main Topics  . Smoking status: Never Smoker   . Smokeless tobacco: Never Used  . Alcohol Use: No  . Drug Use: No  . Sexual Activity: Not Asked   Other Topics Concern  . None   Social History Narrative   No Known Allergies Family History  Problem Relation Age of Onset  . Kidney disease Brother   . Hypertension Brother   .  Esophageal cancer Maternal Uncle   . Leukemia Mother   . Hypertension Brother     Past medical history, social, surgical and family history all reviewed in electronic medical record.  No pertanent information unless stated regarding to the chief complaint.   Review of Systems: No headache, visual changes, nausea, vomiting, diarrhea, constipation, dizziness, abdominal pain, skin rash, fevers, chills, night sweats, weight loss, swollen lymph nodes, body aches, joint swelling, muscle aches, chest pain, shortness of breath, mood changes.   Objective Blood pressure 122/84, pulse 71, height 5\' 3"  (1.6 m),  weight 274 lb (124.286 kg), SpO2 97 %.  General: No apparent distress alert and oriented x3 mood and affect normal, dressed appropriately.  HEENT: Pupils equal, extraocular movements intact  Respiratory: Patient's speak in full sentences and does not appear short of breath  Cardiovascular: No lower extremity edema, non tender, no erythema  Skin: Warm dry intact with no signs of infection or rash on extremities or on axial skeleton.  Abdomen: Soft nontender  Neuro: Cranial nerves II through XII are intact, neurovascularly intact in all extremities with 2+ DTRs and 2+ pulses.  Lymph: No lymphadenopathy of posterior or anterior cervical chain or axillae bilaterally.  Gait normal with good balance and coordination.  MSK:  Non tender with full range of motion and good stability and symmetric strength and tone of shoulders, elbows, wrist, hip, knee and ankles bilaterally.  Left hand exam shows the patient does have a trigger finger with the trigger nodule at the A2 pulley on the middle finger on the left hand. Severely tender to palpation.  Limited musculoskeletal skeletal ultrasound was performed and interpreted by Hulan Saas, M  Limited ultrasound shows the patient does have a trigger nodule sitting superficial on it within the flexor tendon sheath. No tear of the tendon noted. Impression: Trigger finger  Procedure: Real-time Ultrasound Guided Injection of left middle finger flexor tendon sheath injection Device: GE Logiq E  Ultrasound guided injection is preferred based studies that show increased duration, increased effect, greater accuracy, decreased procedural pain, increased response rate, and decreased cost with ultrasound guided versus blind injection.  Verbal informed consent obtained.  Time-out conducted.  Noted no overlying erythema, induration, or other signs of local infection.  Skin prepped in a sterile fashion.  Local anesthesia: Topical Ethyl chloride.  With sterile technique  and under real time ultrasound guidance:  The 25-gauge half-inch needle patient was injected with a total of 0.5 mL of 0.5% Marcaine and 0.5 mL of Kenalog 40 mg/dL. Completed without difficulty  Pain immediately resolved suggesting accurate placement of the medication.  Advised to call if fevers/chills, erythema, induration, drainage, or persistent bleeding.  Images permanently stored and available for review in the ultrasound unit.  Impression: Technically successful ultrasound guided injection.    Impression and Recommendations:     This case required medical decision making of moderate complexity.      Note: This dictation was prepared with Dragon dictation along with smaller phrase technology. Any transcriptional errors that result from this process are unintentional.

## 2015-10-07 ENCOUNTER — Telehealth: Payer: Self-pay | Admitting: Family Medicine

## 2015-10-07 NOTE — Telephone Encounter (Signed)
Patient states trigger finger is coming back.  Stating it is left middle finger.  Would like to know what to do next.

## 2015-10-10 NOTE — Telephone Encounter (Signed)
I would consider one more injection if she would like.  Otherwise she would need surgery .

## 2015-10-10 NOTE — Telephone Encounter (Signed)
lmovm for pt to return call.  

## 2015-10-11 DIAGNOSIS — Z1231 Encounter for screening mammogram for malignant neoplasm of breast: Secondary | ICD-10-CM | POA: Diagnosis not present

## 2015-10-18 ENCOUNTER — Ambulatory Visit: Payer: 59 | Admitting: Family Medicine

## 2015-10-18 DIAGNOSIS — E559 Vitamin D deficiency, unspecified: Secondary | ICD-10-CM | POA: Diagnosis not present

## 2015-10-18 DIAGNOSIS — E663 Overweight: Secondary | ICD-10-CM | POA: Diagnosis not present

## 2015-10-18 DIAGNOSIS — M2550 Pain in unspecified joint: Secondary | ICD-10-CM | POA: Diagnosis not present

## 2015-10-18 DIAGNOSIS — Z0000 Encounter for general adult medical examination without abnormal findings: Secondary | ICD-10-CM | POA: Diagnosis not present

## 2015-10-18 DIAGNOSIS — R350 Frequency of micturition: Secondary | ICD-10-CM | POA: Diagnosis not present

## 2015-10-18 DIAGNOSIS — G4730 Sleep apnea, unspecified: Secondary | ICD-10-CM | POA: Diagnosis not present

## 2015-10-18 DIAGNOSIS — Z6841 Body Mass Index (BMI) 40.0 and over, adult: Secondary | ICD-10-CM | POA: Diagnosis not present

## 2015-10-26 MED FILL — VIT D2 1.25 MG (50,000 UNIT: 1.25 MG | 70 days supply | Qty: 10 | Fill #0

## 2015-10-26 MED FILL — MELOXICAM 15 MG TABLET: 15 | 60 days supply | Qty: 60 | Fill #1

## 2015-10-26 MED FILL — CIPROFLOXACIN HCL 250 MG TA: 250 | 5 days supply | Qty: 10 | Fill #0

## 2015-10-26 MED FILL — SUMATRIPTAN SUCC 25 MG TAB: 25 | 30 days supply | Qty: 9 | Fill #1

## 2015-11-02 DIAGNOSIS — B349 Viral infection, unspecified: Secondary | ICD-10-CM | POA: Diagnosis not present

## 2015-11-02 DIAGNOSIS — J209 Acute bronchitis, unspecified: Secondary | ICD-10-CM | POA: Diagnosis not present

## 2015-11-03 MED FILL — HYDROCODONE-CHLORPHENIRAM S: 10-8 | 20 days supply | Qty: 100 | Fill #0

## 2015-11-03 MED FILL — VENTOLIN HFA 90 MCG INHALER: 108 (90 BAS | 25 days supply | Qty: 18 | Fill #0

## 2015-12-01 DIAGNOSIS — B029 Zoster without complications: Secondary | ICD-10-CM | POA: Diagnosis not present

## 2015-12-27 DIAGNOSIS — R300 Dysuria: Secondary | ICD-10-CM | POA: Diagnosis not present

## 2015-12-27 DIAGNOSIS — N390 Urinary tract infection, site not specified: Secondary | ICD-10-CM | POA: Diagnosis not present

## 2015-12-28 MED FILL — CIPROFLOXACIN HCL 250 MG TA: 250 | 7 days supply | Qty: 14 | Fill #0

## 2016-01-06 MED FILL — SULFAMETHOXAZOLE/TMP DS TAB: 800-160 | 7 days supply | Qty: 14 | Fill #0

## 2016-02-17 ENCOUNTER — Encounter: Payer: Self-pay | Admitting: Podiatry

## 2016-02-17 ENCOUNTER — Ambulatory Visit (INDEPENDENT_AMBULATORY_CARE_PROVIDER_SITE_OTHER): Payer: 59 | Admitting: Podiatry

## 2016-02-17 ENCOUNTER — Ambulatory Visit (INDEPENDENT_AMBULATORY_CARE_PROVIDER_SITE_OTHER): Payer: 59 | Admitting: Family

## 2016-02-17 ENCOUNTER — Encounter: Payer: Self-pay | Admitting: Family

## 2016-02-17 ENCOUNTER — Ambulatory Visit (INDEPENDENT_AMBULATORY_CARE_PROVIDER_SITE_OTHER): Payer: 59

## 2016-02-17 VITALS — BP 144/77 | HR 75 | Resp 16

## 2016-02-17 VITALS — BP 128/72 | HR 57 | Temp 98.0°F | Resp 16 | Ht 63.0 in | Wt 271.0 lb

## 2016-02-17 DIAGNOSIS — M722 Plantar fascial fibromatosis: Secondary | ICD-10-CM

## 2016-02-17 DIAGNOSIS — M6530 Trigger finger, unspecified finger: Secondary | ICD-10-CM

## 2016-02-17 MED ORDER — TRIAMCINOLONE ACETONIDE 10 MG/ML IJ SUSP
10.0000 mg | Freq: Once | INTRAMUSCULAR | Status: AC
  Administered 2016-02-17: 10 mg

## 2016-02-17 NOTE — Progress Notes (Signed)
   Subjective:    Patient ID: Andrea Montes, female    DOB: 19-Dec-1955, 60 y.o.   MRN: [id number]  HPI    Review of Systems  Cardiovascular: Positive for leg swelling.  All other systems reviewed and are negative.      Objective:   Physical Exam        Assessment & Plan:

## 2016-02-17 NOTE — Assessment & Plan Note (Signed)
Symptoms and exam consistent with trigger finger and confirmed on ultrasound. Cortisone injection provided with relief experienced. Continue ice and range of motion exercises. Follow up as needed.

## 2016-02-17 NOTE — Patient Instructions (Signed)
Thank you for choosing [facility].  Summary/Instructions:  Ice for 20 minutes 2-3 times per day as needed.  Monitor for signs of infection.  No heavy lifting for the next 24 hours.  Range of motion exercises.  OTC Ibuprofen or Aleve as needed.   If your symptoms worsen or fail to improve, please contact our office for further instruction, or in case of emergency go directly to the emergency room at the closest medical facility.   Trigger Finger Trigger finger (digital tendinitis and stenosing tenosynovitis) is a common disorder that causes an often painful catching of the fingers or thumb. It occurs as a clicking, snapping, or locking of a finger in the palm of the hand. This is caused by a problem with the tendons that flex or bend the fingers sliding smoothly through their sheaths. The condition may occur in any finger or a couple fingers at the same time.  The finger may lock with the finger curled or suddenly straighten out with a snap. This is more common in patients with rheumatoid arthritis and diabetes. Left untreated, the condition may get worse to the point where the finger becomes locked in flexion, like making a fist, or less commonly locked with the finger straightened out. CAUSES   Inflammation and scarring that lead to swelling around the tendon sheath.  Repeated or forceful movements.  Rheumatoid arthritis, an autoimmune disease that affects joints.  Gout.  Diabetes mellitus. SIGNS AND SYMPTOMS  Soreness and swelling of your finger.  A painful clicking or snapping as you bend and straighten your finger. DIAGNOSIS  Your health care provider will do a physical exam of your finger to diagnose trigger finger. TREATMENT   Splinting for 6-8 weeks may be helpful.  Nonsteroidal anti-inflammatory medicines (NSAIDs) can help to relieve the pain and inflammation.  Cortisone injections, along with splinting, may speed up recovery. Several injections may be  required. Cortisone may give relief after one injection.  Surgery is another treatment that may be used if conservative treatments do not work. Surgery can be minor, without incisions (a cut does not have to be made), and can be done with a needle through the skin.  Other surgical choices involve an open procedure in which the surgeon opens the hand through a small incision and cuts the pulley so the tendon can again slide smoothly. Your hand will still work fine. HOME CARE INSTRUCTIONS  Apply ice to the injured area, twice per day:  Put ice in a plastic bag.  Place a towel between your skin and the bag.  Leave the ice on for 20 minutes, 3-4 times a day.  Rest your hand often. MAKE SURE YOU:   Understand these instructions.  Will watch your condition.  Will get help right away if you are not doing well or get worse.   This information is not intended to replace advice given to you by your health care provider. Make sure you discuss any questions you have with your health care provider.   Document Released: 05/26/2004 Document Revised: 04/08/2013 Document Reviewed: 01/06/2013 Elsevier Interactive Patient Education [employer].

## 2016-02-17 NOTE — Progress Notes (Signed)
Subjective:    Patient ID: Andrea Montes, female    DOB: 1956/03/04, 60 y.o.   MRN: [id number]  Chief Complaint  Patient presents with  . Hand Pain    has a knot below middle finger on left hand, x1 week, states it causing pain and not able to bend it, has times where it will stick and she will have to physically move it herself, has happened before and she got an injection which helped     HPI:  Andrea Montes is a 60 y.o. 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 and presents today for a Sports Medicine visit.   This is a new problem. Previously seen in Sports Medicine and noted to have a trigger finger in multiple fingers. It was treated with a cortisone injection which helped to relieve her symptoms. Now has the associated symptom of a knot located on her middle finger. Range of motion is limited and describes a sticking sensation where she physically has to move it at times. These symptoms have recently returned within the last week. Denies any attempted treatments or factors that make it better.   No Known Allergies   Current Outpatient Prescriptions on File Prior to Visit  Medication Sig Dispense Refill  . cholecalciferol (VITAMIN D) 1000 UNITS tablet Take 50,000 Units by mouth once a week.     . cyclobenzaprine (FLEXERIL) 10 MG tablet Take 1 tablet (10 mg total) by mouth 2 (two) times daily as needed for muscle spasms. 20 tablet 0  . furosemide (LASIX) 20 MG tablet TAKE 1 TABLET BY MOUTH ONCE DAILY 30 tablet 6  . meloxicam (MOBIC) 15 MG tablet Take 1 tablet (15 mg total) by mouth daily  as needed for pain. 60 tablet 1  . ondansetron (ZOFRAN) 4 MG tablet Take 1 tablet (4 mg total) by mouth every 8 (eight) hours as needed for nausea or vomiting. 40 tablet 0  . oxyCODONE-acetaminophen (ROXICET) 5-325 MG per tablet Take 1-2 tablets by mouth every 4 (four) hours as needed. 60 tablet 0   No current facility-administered medications on file prior to visit.     Past Surgical History  Procedure Laterality Date  . Rotator cuff repair  2010  . Vaginal hysterectomy  2001    partial  . Shoulder arthroscopy with rotator cuff repair Right 10/04/2008  . Carpal tunnel release Left 04/25/2006  . Colonoscopy with propofol  10/09/2011  . Shoulder arthroscopy with subacromial decompression, rotator cuff repair and bicep tendon repair Left 11/04/2014    Procedure: LEFT SHOULDER ARTHROSCOPY WITH DEBRIDEMENT, DISTAL CLAVICLE EXCISION, ACROMIOPLASTY, ROTATOR CUFF REPAIR AND BICEP TENODESIS;  Surgeon: Kathryne Hitch, MD;  Location: [facility];  Service: Orthopedics;  Laterality: Left;  . Resection distal clavical Left 11/04/2014    Procedure: RESECTION DISTAL CLAVICAL;  Surgeon: Kathryne Hitch, MD;  Location: [facility];  Service: Orthopedics;  Laterality: Left;  . Closed manipulation shoulder with steriod injection Left 02/03/2015    Procedure: LEFT  MANIPULATION SHOULDER UNDER ANESTHESIA WITH INJECTION OF STEROID;  Surgeon: Kathryne Hitch, MD;  Location: [facility];  Service: Orthopedics;  Laterality: Left;  . Trigger finger release Right 02/03/2015  Procedure: RIGHT LONG FINGER TRIGGER RELEASE;  Surgeon: Kathryne Hitch, MD;  Location: [facility];  Service: Orthopedics;  Laterality: Right;     Review of Systems  Constitutional: Negative for fever and chills.  Musculoskeletal:       Positive for left hand pain and decreased range of motion.   Neurological: Negative for weakness and numbness.      Objective:    BP 128/72 mmHg  Pulse 57   Temp(Src) 98 F (36.7 C) (Oral)  Resp 16  Ht 5\' 3"  (1.6 m)  Wt 271 lb (122.925 kg)  BMI 48.02 kg/m2  SpO2 97% Nursing note and vital signs reviewed.  Physical Exam  Constitutional: She is oriented to person, place, and time. She appears well-developed and well-nourished. No distress.  Cardiovascular: Normal rate, regular rhythm, normal heart sounds and intact distal pulses.   Pulmonary/Chest: Effort normal and breath sounds normal.  Musculoskeletal:  Left hand - no obvious deformity, discoloration, or edema palpable tenderness and small mass located over flexor digitorum tendon. Range of motion is restricted in flexion secondary to discomfort. There is a sticking point when attempting to extend.   Neurological: She is alert and oriented to person, place, and time.  Skin: Skin is warm and dry.  Psychiatric: She has a normal mood and affect. Her behavior is normal. Judgment and thought content normal.    Examination: Ultrasound of the left fingers.  Date:  02/17/2016 Patient Name: Andrea Montes History:  Painful nodule with limited range of motion. Findings: Limited ultrasound of the fingers on the left hand shows a trigger nodule located on/within the flexor tendon of the 3rd metacarpal. No evidence of tear. Extensor tendon and metacarpal appear with tear or fracture.    Impression:  Trigger finger of the left 3rd metacarpal.     All images are located under the media tab. [name] ordered, performed and interpreted by Terri Piedra, FNP     Procedure: Trigger finger injection of left third middle finger  Description: Informed consent was obtained with discussion including risks and benefits of the procedure. Patient verbally wished to continued. Time out was performed. The site was marked and identified. It was cleansed with betadine using a concentric circular pattern. Skin anesthesia was applied using cold spray applied for 10 seconds. Injection of 0.5 : 0.5  Kenalog to  Sensoricaine was injected following aspiration with no return noted. Following the injection there was instant relief confirming appropriate placement. A bandage was applied to the area and post care instructions were provided. The procedure was tolerated well with no complications.      Assessment & Plan:   Problem List Items Addressed This Visit      Musculoskeletal and Integument   Trigger finger, acquired - Primary    Symptoms and exam consistent with trigger finger and confirmed on ultrasound. Cortisone injection provided with relief experienced. Continue ice and range of motion exercises. Follow up as needed.       Relevant Orders   [name]       I am having Ms. Kalas maintain her cholecalciferol, furosemide, cyclobenzaprine, oxyCODONE-acetaminophen, ondansetron, and meloxicam.    Follow-up: Return if symptoms worsen or fail to improve.  Mauricio Po, FNP

## 2016-02-17 NOTE — Progress Notes (Signed)
Pre visit review using our clinic review tool, if applicable. No additional management support is needed unless otherwise documented below in the visit note. 

## 2016-02-17 NOTE — Progress Notes (Signed)
Subjective:     Patient ID: Andrea Montes, female   DOB: 09/22/1955, 60 y.o.   MRN: [id number]  HPI patient states she's getting a lot of pain in her arch right over left with inflammation of the medial band and she no she needs new orthotics as hers have worn out. Patient does have obesity which is complicating factor   Review of Systems  All other systems reviewed and are negative.      Objective:   Physical Exam  Constitutional: She is oriented to person, place, and time.  Cardiovascular: Intact distal pulses.   Musculoskeletal: Normal range of motion.  Neurological: She is oriented to person, place, and time.  Skin: Skin is warm.  Nursing note and vitals reviewed.  Neurovascular status intact muscle strength adequate range of motion within normal limits with patient found to have exquisite discomfort in the mid arch region right with fluid buildup noted in the mid arch. Moderate depression of the arch is noted bilateral and patient's found to have good digital perfusion and is well oriented 3     Assessment:     Inflammatory changes consistent with plantar fasciitis right over left with flattening of the arch which is contributory with history of orthotics    Plan:     H&P x-rays reviewed and all conditions discussed. Today I injected the mid fascial right 3 mg Kenalog 5 mg Xylocaine and applied fascial brace and then scanned for custom orthotics to reduce plantar stresses. Patient will be seen back to recheck  X-ray report indicated that there is flattening the arch bilateral with spur formation and no indications of advanced arthritis

## 2016-03-07 ENCOUNTER — Telehealth: Payer: Self-pay

## 2016-03-07 ENCOUNTER — Other Ambulatory Visit (INDEPENDENT_AMBULATORY_CARE_PROVIDER_SITE_OTHER): Payer: 59 | Admitting: *Deleted

## 2016-03-07 DIAGNOSIS — Z79899 Other long term (current) drug therapy: Secondary | ICD-10-CM | POA: Diagnosis not present

## 2016-03-07 MED ORDER — FUROSEMIDE 20 MG PO TABS: 20.0000 mg | ORAL_TABLET | Freq: Every day | ORAL | Status: DC

## 2016-03-07 NOTE — Telephone Encounter (Signed)
Informed patient that Dr. Johnsie Cancel want to have lab work done. Patient will have BMET done today. Refill sent to patient's pharmacy. Patient verbalized understanding.

## 2016-03-07 NOTE — Telephone Encounter (Signed)
Patient requesting refill on her Furosemide. Last office visit 10/2014. Patient stated she was to follow-up with Dr. Johnsie Cancel as needed. Will forward to Dr. Johnsie Cancel to approve refill.

## 2016-03-07 NOTE — Telephone Encounter (Signed)
Needs BMET ok to refill

## 2016-03-08 LAB — BASIC METABOLIC PANEL
BUN: 14 mg/dL (ref 7–25)
CHLORIDE: 102 mmol/L (ref 98–110)
CO2: 26 mmol/L (ref 20–31)
Calcium: 8.8 mg/dL (ref 8.6–10.4)
Creat: 0.85 mg/dL (ref 0.50–1.05)
GLUCOSE: 121 mg/dL — AB (ref 65–99)
POTASSIUM: 3.8 mmol/L (ref 3.5–5.3)
SODIUM: 139 mmol/L (ref 135–146)

## 2016-03-09 ENCOUNTER — Ambulatory Visit: Payer: 59 | Admitting: *Deleted

## 2016-03-09 DIAGNOSIS — M722 Plantar fascial fibromatosis: Secondary | ICD-10-CM

## 2016-03-09 NOTE — Progress Notes (Signed)
Patient ID: Andrea Montes, female   DOB: 09/02/1955, 60 y.o.   MRN: [id number] Patient presents for orthotic pick up.  Verbal and written break in and wear instructions given.  Patient will follow up in 4 weeks if symptoms worsen or fail to improve.

## 2016-03-09 NOTE — Patient Instructions (Signed)

## 2016-04-11 ENCOUNTER — Ambulatory Visit (INDEPENDENT_AMBULATORY_CARE_PROVIDER_SITE_OTHER): Payer: 59 | Admitting: Sports Medicine

## 2016-04-11 ENCOUNTER — Encounter: Payer: Self-pay | Admitting: Sports Medicine

## 2016-04-11 VITALS — BP 151/75 | Ht 63.0 in | Wt 250.0 lb

## 2016-04-11 DIAGNOSIS — M47816 Spondylosis without myelopathy or radiculopathy, lumbar region: Secondary | ICD-10-CM | POA: Diagnosis not present

## 2016-04-11 MED ORDER — METHYLPREDNISOLONE ACETATE 80 MG/ML IJ SUSP
80.0000 mg | Freq: Once | INTRAMUSCULAR | Status: AC
  Administered 2016-04-11: 80 mg via INTRAMUSCULAR

## 2016-04-11 MED ORDER — CYCLOBENZAPRINE HCL 10 MG PO TABS: 10.0000 mg | ORAL_TABLET | Freq: Every evening | ORAL | 0 refills | Status: DC | PRN

## 2016-04-11 MED ORDER — DICLOFENAC SODIUM 75 MG PO TBEC: 75.0000 mg | DELAYED_RELEASE_TABLET | Freq: Two times a day (BID) | ORAL | 2 refills | Status: DC | PRN

## 2016-04-11 MED FILL — FUROSEMIDE 20 MG TABLET: 20 | 90 days supply | Qty: 90 | Fill #0

## 2016-04-11 MED FILL — SUMATRIPTAN SUCC 25 MG TAB: 25 | 30 days supply | Qty: 9 | Fill #2

## 2016-04-11 MED FILL — CYCLOBENZAPRINE 10 MG TAB: 10 | 30 days supply | Qty: 30 | Fill #0

## 2016-04-11 MED FILL — DICLOFENAC SOD EC 75 MG TAB: 75 | 30 days supply | Qty: 60 | Fill #0

## 2016-04-11 NOTE — Progress Notes (Signed)
   Subjective:    Patient ID: Andrea Montes, female    DOB: 14-Jun-1956, 60 y.o.   MRN: [id number]  HPI chief complaint: Low back pain  Patient comes in today for her annual follow-up of chronic low back pain. She has a well-documented history of lumbar facet arthropathy. She's been treated in the past with medications and injections. She is currently taking meloxicam but it is not very beneficial. She would like a change of medication. She would also like a refill on Flexeril which she has taken in the past with good results. She denies any recent trauma. No numbness or tingling in her legs. Pain is primarily across her low back and at times can be quite severe. She has seen neurosurgery in the past and they have recommended conservative treatment without surgery.  Interim medical history and surgical history reviewed Medications reviewed Allergies reviewed    Review of Systems     Objective:   Physical Exam  Obese. No acute distress.  Lumbar spine: Limited range of motion secondary to pain especially with extension. No spasm. Diffuse tenderness to palpation along the lumbosacral area but nothing focal. No focal neurological deficits of either lower extremity.      Assessment & Plan:   Chronic low back pain secondary to advanced facet arthropathy  I will change her meloxicam to Voltaren 75 mg twice daily with food as needed. I've also given her a prescription for Flexeril 10 mg to take daily at bedtime as needed. We will also inject her with 80 mg of Depo-Medrol IM today. I will update her FMLA paperwork and she will follow-up with me as needed.

## 2016-05-24 ENCOUNTER — Ambulatory Visit (INDEPENDENT_AMBULATORY_CARE_PROVIDER_SITE_OTHER): Payer: 59 | Admitting: Sports Medicine

## 2016-05-24 ENCOUNTER — Encounter: Payer: Self-pay | Admitting: Sports Medicine

## 2016-05-24 VITALS — BP 144/65 | HR 60 | Ht 63.0 in | Wt 255.0 lb

## 2016-05-24 DIAGNOSIS — M79605 Pain in left leg: Secondary | ICD-10-CM

## 2016-05-24 DIAGNOSIS — M545 Low back pain, unspecified: Secondary | ICD-10-CM

## 2016-05-24 DIAGNOSIS — S39012A Strain of muscle, fascia and tendon of lower back, initial encounter: Secondary | ICD-10-CM | POA: Diagnosis not present

## 2016-05-24 MED ORDER — CYCLOBENZAPRINE HCL 10 MG PO TABS: 10.0000 mg | ORAL_TABLET | Freq: Three times a day (TID) | ORAL | 3 refills | Status: DC | PRN

## 2016-05-24 MED ORDER — TRAMADOL HCL 50 MG PO TABS: 50.0000 mg | ORAL_TABLET | Freq: Four times a day (QID) | ORAL | 2 refills | Status: DC | PRN

## 2016-05-24 MED FILL — CYCLOBENZAPRINE 10 MG TAB: 10 | 30 days supply | Qty: 90 | Fill #0

## 2016-05-24 MED FILL — traMADol HCL 50 MG TABS: 50 | 15 days supply | Qty: 60 | Fill #0
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
## 2016-05-24 NOTE — Patient Instructions (Addendum)
Thera-wrap= provides 6 hours of heat Tramadol 4 x a day until control of pain  Flexeril BID  Do not sit for more than 2 hours in a row Easy rotation, lateral movements to get motion back
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
## 2016-06-06 ENCOUNTER — Encounter: Payer: Self-pay | Admitting: Sports Medicine

## 2016-06-06 ENCOUNTER — Ambulatory Visit (INDEPENDENT_AMBULATORY_CARE_PROVIDER_SITE_OTHER): Payer: 59 | Admitting: Sports Medicine

## 2016-06-06 VITALS — BP 139/51 | HR 67 | Ht 63.0 in | Wt 255.0 lb

## 2016-06-06 DIAGNOSIS — M545 Low back pain, unspecified: Secondary | ICD-10-CM
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
## 2016-06-16 DIAGNOSIS — H5203 Hypermetropia, bilateral: Secondary | ICD-10-CM | POA: Diagnosis not present

## 2016-06-22 MED FILL — AMOX-CLAV 875-125 MG TABLET: 875-125 | 7 days supply | Qty: 14 | Fill #0

## 2016-08-31 ENCOUNTER — Encounter: Payer: Self-pay | Admitting: Gastroenterology

## 2016-09-13 ENCOUNTER — Encounter: Payer: Self-pay | Admitting: Gastroenterology

## 2016-10-22 ENCOUNTER — Ambulatory Visit (AMBULATORY_SURGERY_CENTER): Payer: Self-pay | Admitting: *Deleted

## 2016-10-22 ENCOUNTER — Encounter: Payer: Self-pay | Admitting: Gastroenterology

## 2016-10-22 VITALS — Ht 63.0 in | Wt 277.6 lb

## 2016-10-22 DIAGNOSIS — Z86010 Personal history of colonic polyps: Secondary | ICD-10-CM

## 2016-10-22 MED ORDER — NA SULFATE-K SULFATE-MG SULF 17.5-3.13-1.6 GM/177ML PO SOLN: 1.0000 | Freq: Once | ORAL | 0 refills | Status: AC

## 2016-10-22 MED FILL — SUPREP BOWEL PREP KIT: 17.5-3.13-1 | 1 days supply | Qty: 354 | Fill #0

## 2016-10-22 NOTE — Progress Notes (Signed)
No egg or soy allergy known to patient  No issues with past sedation with any surgeries  or procedures, no intubation problems  No diet pills per patient No home 02 use per patient  No blood thinners per patient  Pt states occ issues with constipation  - not chronic, no meds for this  No A fib or A flutter

## 2016-10-25 ENCOUNTER — Encounter: Payer: Self-pay | Admitting: Gastroenterology

## 2016-11-05 ENCOUNTER — Encounter: Payer: Self-pay | Admitting: Gastroenterology

## 2016-11-05 ENCOUNTER — Ambulatory Visit (AMBULATORY_SURGERY_CENTER): Payer: 59 | Admitting: Gastroenterology

## 2016-11-05 VITALS — BP 122/65 | HR 59 | Temp 97.7°F | Resp 18 | Ht 63.0 in | Wt 277.0 lb

## 2016-11-05 DIAGNOSIS — D124 Benign neoplasm of descending colon: Secondary | ICD-10-CM | POA: Diagnosis not present

## 2016-11-05 DIAGNOSIS — Z1211 Encounter for screening for malignant neoplasm of colon: Secondary | ICD-10-CM | POA: Diagnosis not present

## 2016-11-05 DIAGNOSIS — D122 Benign neoplasm of ascending colon: Secondary | ICD-10-CM

## 2016-11-05 DIAGNOSIS — Z86010 Personal history of colonic polyps: Secondary | ICD-10-CM | POA: Diagnosis present

## 2016-11-05 MED ORDER — SODIUM CHLORIDE 0.9 % IV SOLN: 500.0000 mL | INTRAVENOUS | Status: AC

## 2016-11-05 NOTE — Patient Instructions (Signed)
YOU HAD AN ENDOSCOPIC PROCEDURE TODAY AT [facility]:   Refer to the procedure report that was given to you for any specific questions about what was found during the examination.  If the procedure report does not answer your questions, please call your gastroenterologist to clarify.  If you requested that your care partner not be given the details of your procedure findings, then the procedure report has been included in a sealed envelope for you to review at your convenience later.  YOU SHOULD EXPECT: Some feelings of bloating in the abdomen. Passage of more gas than usual.  Walking can help get rid of the air that was put into your GI tract during the procedure and reduce the bloating. If you had a lower endoscopy (such as a colonoscopy or flexible sigmoidoscopy) you may notice spotting of blood in your stool or on the toilet paper. If you underwent a bowel prep for your procedure, you may not have a normal bowel movement for a few days.  Please Note:  You might notice some irritation and congestion in your nose or some drainage.  This is from the oxygen used during your procedure.  There is no need for concern and it should clear up in a day or so.  SYMPTOMS TO REPORT IMMEDIATELY:   Following lower endoscopy (colonoscopy or flexible sigmoidoscopy):  Excessive amounts of blood in the stool  Significant tenderness or worsening of abdominal pains  Swelling of the abdomen that is new, acute  Fever of 100F or higher  For urgent or emergent issues, a gastroenterologist can be reached at any hour by calling [phone].   DIET:  We do recommend a small meal at first, but then you may proceed to your regular diet.  Drink plenty of fluids but you should avoid alcoholic beverages for 24 hours.  ACTIVITY:  You should plan to take it easy for the rest of today and you should NOT DRIVE or use heavy machinery until tomorrow (because of the sedation medicines used during the test).     FOLLOW UP: Our staff will call the number listed on your records the next business day following your procedure to check on you and address any questions or concerns that you may have regarding the information given to you following your procedure. If we do not reach you, we will leave a message.  However, if you are feeling well and you are not experiencing any problems, there is no need to return our call.  We will assume that you have returned to your regular daily activities without incident.  If any biopsies were taken you will be contacted by phone or by letter within the next 1-3 weeks.  Please call us at [phone] if you have not heard about the biopsies in 3 weeks.   SIGNATURES/CONFIDENTIALITY: You and/or your care partner have signed paperwork which will be entered into your electronic medical record.  These signatures attest to the fact that that the information above on your After Visit Summary has been reviewed and is understood.  Full responsibility of the confidentiality of this discharge information lies with you and/or your care-partner.  Await pathology  Please read over handout about polyps  Continue your normal medications

## 2016-11-05 NOTE — Progress Notes (Signed)
Pt. Reports no change in medical or surgical history since pre-visit.   

## 2016-11-05 NOTE — Progress Notes (Signed)
A/ox3 pleased with MAC, report to [employer]

## 2016-11-05 NOTE — Progress Notes (Signed)
Called to room to assist during endoscopic procedure.  Patient ID and intended procedure confirmed with present staff. Received instructions for my participation in the procedure from the performing physician.  

## 2016-11-05 NOTE — Op Note (Signed)
[facility] Patient Name: Andrea Montes Procedure Date: 11/05/2016 7:52 AM MRN: [id number] Endoscopist: Mauri Pole , MD Age: 61 Referring MD:  Date of Birth: 1955/09/13 Gender: Female Account #: [id number] Procedure:                Colonoscopy Indications:              Surveillance: Personal history of adenomatous                            polyps on last colonoscopy 5 years ago, High risk                            colon cancer surveillance: Personal history of                            adenoma less than 10 mm in size Medicines:                Monitored Anesthesia Care Procedure:                Pre-Anesthesia Assessment:                           - Prior to the procedure, a History and Physical                            was performed, and patient medications and                            allergies were reviewed. The patient's tolerance of                            previous anesthesia was also reviewed. The risks                            and benefits of the procedure and the sedation                            options and risks were discussed with the patient.                            All questions were answered, and informed consent                            was obtained. Prior Anticoagulants: The patient has                            taken no previous anticoagulant or antiplatelet                            agents. ASA Grade Assessment: III - A patient with                            severe systemic disease. After reviewing the risks  and benefits, the patient was deemed in                            satisfactory condition to undergo the procedure.                           After obtaining informed consent, the colonoscope                            was passed under direct vision. Throughout the                            procedure, the patient's blood pressure, pulse, and                            oxygen saturations were  monitored continuously. The                            Colonoscope was introduced through the anus and                            advanced to the the terminal ileum, with                            identification of the appendiceal orifice and IC                            valve. The colonoscopy was performed without                            difficulty. The patient tolerated the procedure                            well. The quality of the bowel preparation was                            excellent. The terminal ileum, ileocecal valve,                            appendiceal orifice, and rectum were photographed. Scope In: 8:14:23 AM Scope Out: 8:29:48 AM Scope Withdrawal Time: 0 hours 13 minutes 15 seconds  Total Procedure Duration: 0 hours 15 minutes 25 seconds  Findings:                 The perianal and digital rectal examinations were                            normal.                           Two sessile polyps were found in the descending                            colon and ascending colon. The polyps were 4 to 7  mm in size. These polyps were removed with a cold                            snare. Resection and retrieval were complete.                           The exam was otherwise without abnormality. Complications:            No immediate complications. Estimated Blood Loss:     Estimated blood loss was minimal. Impression:               - Two 4 to 7 mm polyps in the descending colon and                            in the ascending colon, removed with a cold snare.                            Resected and retrieved.                           - The examination was otherwise normal. Recommendation:           - Patient has a contact number available for                            emergencies. The signs and symptoms of potential                            delayed complications were discussed with the                            patient. Return to normal  activities tomorrow.                            Written discharge instructions were provided to the                            patient.                           - Resume previous diet.                           - Continue present medications.                           - Await pathology results.                           - Repeat colonoscopy in 5 years for surveillance                            based on pathology results. Mauri Pole, MD 11/05/2016 8:38:37 AM This report has been signed electronically.

## 2016-11-06 ENCOUNTER — Telehealth: Payer: Self-pay

## 2016-11-06 DIAGNOSIS — Z0000 Encounter for general adult medical examination without abnormal findings: Secondary | ICD-10-CM | POA: Diagnosis not present

## 2016-11-06 DIAGNOSIS — R300 Dysuria: Secondary | ICD-10-CM | POA: Diagnosis not present

## 2016-11-06 DIAGNOSIS — E663 Overweight: Secondary | ICD-10-CM | POA: Diagnosis not present

## 2016-11-06 DIAGNOSIS — Z6841 Body Mass Index (BMI) 40.0 and over, adult: Secondary | ICD-10-CM | POA: Diagnosis not present

## 2016-11-06 DIAGNOSIS — G4730 Sleep apnea, unspecified: Secondary | ICD-10-CM | POA: Diagnosis not present

## 2016-11-06 NOTE — Telephone Encounter (Signed)
Patient does not have voicemail set up

## 2016-11-07 MED FILL — CIPROFLOXACIN HCL 250 MG TA: 250 | 5 days supply | Qty: 10 | Fill #0

## 2016-11-09 ENCOUNTER — Encounter: Payer: Self-pay | Admitting: Gastroenterology

## 2016-11-09 MED FILL — FUROSEMIDE 20 MG TABLET: 20 | 90 days supply | Qty: 90 | Fill #1

## 2016-12-06 MED FILL — CIPROFLOXACIN HCL 250 MG TA: 250 | 5 days supply | Qty: 10 | Fill #0

## 2016-12-07 DIAGNOSIS — R300 Dysuria: Secondary | ICD-10-CM | POA: Diagnosis not present

## 2016-12-11 MED FILL — NITROFURANTOIN MONO-MCR 100: 100 | 5 days supply | Qty: 10 | Fill #0

## 2017-01-03 DIAGNOSIS — G4733 Obstructive sleep apnea (adult) (pediatric): Secondary | ICD-10-CM | POA: Diagnosis not present

## 2017-03-17 ENCOUNTER — Emergency Department (HOSPITAL_COMMUNITY)
Admission: EM | Admit: 2017-03-17 | Discharge: 2017-03-17 | Disposition: A | Discharge status: Home / Self Care | Payer: 59 | Attending: Emergency Medicine | Admitting: Emergency Medicine

## 2017-03-17 ENCOUNTER — Encounter (HOSPITAL_COMMUNITY): Payer: Self-pay | Admitting: Emergency Medicine

## 2017-03-17 DIAGNOSIS — R51 Headache: Secondary | ICD-10-CM | POA: Diagnosis not present

## 2017-03-17 DIAGNOSIS — S338XXA Sprain of other parts of lumbar spine and pelvis, initial encounter: Secondary | ICD-10-CM | POA: Insufficient documentation

## 2017-03-17 DIAGNOSIS — Y998 Other external cause status: Secondary | ICD-10-CM | POA: Insufficient documentation

## 2017-03-17 DIAGNOSIS — Y929 Unspecified place or not applicable: Secondary | ICD-10-CM | POA: Insufficient documentation

## 2017-03-17 DIAGNOSIS — Y9389 Activity, other specified: Secondary | ICD-10-CM | POA: Diagnosis not present

## 2017-03-17 DIAGNOSIS — S39012A Strain of muscle, fascia and tendon of lower back, initial encounter: Secondary | ICD-10-CM | POA: Diagnosis not present

## 2017-03-17 DIAGNOSIS — Z79899 Other long term (current) drug therapy: Secondary | ICD-10-CM | POA: Insufficient documentation

## 2017-03-17 DIAGNOSIS — I10 Essential (primary) hypertension: Secondary | ICD-10-CM | POA: Insufficient documentation

## 2017-03-17 NOTE — Discharge Instructions (Signed)
You can take Tylenol as directed or your Celebrex as prescribed and he can also take a muscle relaxer as needed. Contact Dr. polite to arrange to be seen in the office if you continue to have significant pain in 3 or 4 days.
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
## 2017-04-15 ENCOUNTER — Encounter: Payer: Self-pay | Admitting: Sports Medicine

## 2017-04-15 ENCOUNTER — Ambulatory Visit
Admission: RE | Admit: 2017-04-15 | Discharge: 2017-04-15 | Disposition: A | Discharge status: Home / Self Care | Payer: 59 | Source: Ambulatory Visit | Attending: Sports Medicine | Admitting: Sports Medicine

## 2017-04-15 ENCOUNTER — Ambulatory Visit (INDEPENDENT_AMBULATORY_CARE_PROVIDER_SITE_OTHER): Payer: 59 | Admitting: Sports Medicine

## 2017-04-15 VITALS — BP 122/80 | Ht 63.0 in | Wt 260.0 lb

## 2017-04-15 DIAGNOSIS — M25511 Pain in right shoulder: Secondary | ICD-10-CM

## 2017-04-15 DIAGNOSIS — M4696 Unspecified inflammatory spondylopathy, lumbar region: Secondary | ICD-10-CM

## 2017-04-15 DIAGNOSIS — J322 Chronic ethmoidal sinusitis: Secondary | ICD-10-CM | POA: Diagnosis not present

## 2017-04-15 DIAGNOSIS — M47816 Spondylosis without myelopathy or radiculopathy, lumbar region: Secondary | ICD-10-CM

## 2017-04-15 DIAGNOSIS — S4991XA Unspecified injury of right shoulder and upper arm, initial encounter: Secondary | ICD-10-CM | POA: Diagnosis not present

## 2017-04-15 MED ORDER — METHYLPREDNISOLONE ACETATE 80 MG/ML IJ SUSP
80.0000 mg | Freq: Once | INTRAMUSCULAR | Status: AC
  Administered 2017-04-15: 80 mg via INTRAMUSCULAR

## 2017-04-15 MED FILL — AZITHROMYCIN 250 MG TABLET: 250 | 5 days supply | Qty: 6 | Fill #0
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
## 2017-04-23 ENCOUNTER — Telehealth: Payer: Self-pay | Admitting: Sports Medicine

## 2017-04-23 NOTE — Telephone Encounter (Signed)
Spoke with Andrea Montes on the phone today after reviewing the x-ray of her right shoulder. She has degenerative changes in the glenohumeral joint and acromioclavicular joint. She still struggling with pain. I recommend that she return to the office for consideration of a cortisone injection. She will schedule this at her earliest convenience.

## 2017-05-02 ENCOUNTER — Encounter: Payer: Self-pay | Admitting: Sports Medicine

## 2017-05-02 ENCOUNTER — Ambulatory Visit (INDEPENDENT_AMBULATORY_CARE_PROVIDER_SITE_OTHER): Payer: 59 | Admitting: Sports Medicine

## 2017-05-02 VITALS — BP 172/96 | Ht 63.0 in | Wt 263.0 lb

## 2017-05-02 DIAGNOSIS — M25511 Pain in right shoulder: Secondary | ICD-10-CM

## 2017-05-02 MED ORDER — TRAMADOL HCL 50 MG PO TABS: 50.0000 mg | ORAL_TABLET | Freq: Four times a day (QID) | ORAL | 2 refills | Status: DC | PRN

## 2017-05-02 MED ORDER — MELOXICAM 15 MG PO TABS: 15.0000 mg | ORAL_TABLET | Freq: Every day | ORAL | 1 refills | Status: AC | PRN

## 2017-05-02 MED ORDER — METHYLPREDNISOLONE ACETATE 40 MG/ML IJ SUSP
40.0000 mg | Freq: Once | INTRAMUSCULAR | Status: AC
  Administered 2017-05-02: 40 mg via INTRA_ARTICULAR

## 2017-05-02 MED FILL — traMADol HCL 50 MG TABS: 50 | 15 days supply | Qty: 60 | Fill #0

## 2017-05-02 MED FILL — MELOXICAM 15 MG TABLET: 15 | 60 days supply | Qty: 60 | Fill #0

## 2017-05-02 MED FILL — CYCLOBENZAPRINE 10 MG TAB: 10 | 30 days supply | Qty: 90 | Fill #1

## 2017-05-02 NOTE — Progress Notes (Signed)
  Patient comes in today for a subacromial cortisone injection into the right shoulder. Recent x-rays have shown degenerative changes in the shoulder. She also has a history of bilateral rotator cuff repairs. She still having significant pain. Patient's shoulder was injected today without complication. I've given her a sling to wear for comfort only. She understands that she is not to use it continuously. I've refilled both her tramadol and her meloxicam. She will be out of work tomorrow and will follow-up with me in 3 weeks. If symptoms persist at follow-up we will need to consider further diagnostic imaging. Call with questions or concerns in the interim.  Consent obtained and verified. Time-out conducted. Noted no overlying erythema, induration, or other signs of local infection. Skin prepped in a sterile fashion. Topical analgesic spray: Ethyl chloride. Joint: right shoulder (subacromial) Needle: 25g 1.5 inch Completed without difficulty. Meds: 3cc 1% xylocaine, 1cc (40mg ) depomedrol  Advised to call if fevers/chills, erythema, induration, drainage, or persistent bleeding.

## 2017-05-27 ENCOUNTER — Ambulatory Visit (INDEPENDENT_AMBULATORY_CARE_PROVIDER_SITE_OTHER): Payer: 59 | Admitting: Sports Medicine

## 2017-05-27 ENCOUNTER — Encounter: Payer: Self-pay | Admitting: Sports Medicine

## 2017-05-27 DIAGNOSIS — M25511 Pain in right shoulder: Secondary | ICD-10-CM

## 2017-05-27 MED ORDER — DIAZEPAM 10 MG PO TABS: ORAL_TABLET | ORAL | 0 refills | Status: DC

## 2017-05-27 MED FILL — diazePAM 10 MG TABS: 10 | 1 days supply | Qty: 2 | Fill #0

## 2017-05-27 NOTE — Progress Notes (Signed)
   Subjective:    Patient ID: Andrea Montes, female    DOB: 1956/03/28, 61 y.o.   MRN: [id number]  HPI [name] presents today for follow up of R shoulder pain. 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 After 2 weeks, she noted an increase in dull pain over her R anterior shoulder. Has been using mobic and flexeril. Notes the tramadol doesn't help her pain as much. No new injury to the area. No swelling or redness.   Review of Systems     Objective:   Physical Exam BP 140/82   Ht 5\' 3"  (1.6 m)   Wt 263 lb (119.3 kg)   BMI 46.59 kg/m   R shoulder: +painful arch, +pain on empty can test, decreased passive ROM, diffuse tenderness over R subscapularis and biceps insertions. No signs of effusion.      Assessment & Plan:  Right shoulder pain: concern for rotator cuff pathology based on exam and area of tenderness. Given minor improvement with injection, will pursue MRI imaging to further elucidate specific rotator cuff pathology (bursitis vs partial tear). Continue flexeril and mobic. Will prescribe open MRI with one dose of valium given patient's feelings of claustrophobia (has had MRI in the past and done well with PRN benzo).   Ralene Ok, MD   Patient seen and evaluated with the resident. I agree with the above plan of care. Patient received 2 weeks of symptom relief with a subacromial cortisone injection. However, her pain has returned. She has limited active range of motion as well as some mild weakness on exam. Previous x-rays showed nothing acute. We will get an MRI specifically to rule out a rotator cuff tear. Phone follow-up with the results of that study when available and we will delineate a more definitive treatment plan based on those findings.

## 2017-06-01 ENCOUNTER — Other Ambulatory Visit (HOSPITAL_BASED_OUTPATIENT_CLINIC_OR_DEPARTMENT_OTHER): Payer: 59

## 2017-06-01 ENCOUNTER — Ambulatory Visit (HOSPITAL_BASED_OUTPATIENT_CLINIC_OR_DEPARTMENT_OTHER)
Admission: RE | Admit: 2017-06-01 | Discharge: 2017-06-01 | Disposition: A | Discharge status: Home / Self Care | Payer: 59 | Source: Ambulatory Visit | Attending: Sports Medicine | Admitting: Sports Medicine

## 2017-06-01 DIAGNOSIS — M659 Synovitis and tenosynovitis, unspecified: Secondary | ICD-10-CM | POA: Diagnosis not present

## 2017-06-01 DIAGNOSIS — M25511 Pain in right shoulder: Secondary | ICD-10-CM | POA: Insufficient documentation

## 2017-06-01 DIAGNOSIS — M75101 Unspecified rotator cuff tear or rupture of right shoulder, not specified as traumatic: Secondary | ICD-10-CM | POA: Diagnosis not present

## 2017-06-01 DIAGNOSIS — M19011 Primary osteoarthritis, right shoulder: Secondary | ICD-10-CM | POA: Diagnosis not present

## 2017-06-01 DIAGNOSIS — Z9889 Other specified postprocedural states: Secondary | ICD-10-CM | POA: Insufficient documentation

## 2017-06-04 ENCOUNTER — Telehealth: Payer: Self-pay | Admitting: Sports Medicine
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
## 2017-06-04 NOTE — Addendum Note (Signed)
Addended by: Jolinda Croak E on: 06/04/2017 03:36 PM   Modules accepted: Orders

## 2017-06-07 ENCOUNTER — Ambulatory Visit (INDEPENDENT_AMBULATORY_CARE_PROVIDER_SITE_OTHER): Payer: 59 | Admitting: Orthopedic Surgery

## 2017-06-07 ENCOUNTER — Encounter (INDEPENDENT_AMBULATORY_CARE_PROVIDER_SITE_OTHER): Payer: Self-pay | Admitting: Orthopedic Surgery

## 2017-06-07 DIAGNOSIS — M67442 Ganglion, left hand: Secondary | ICD-10-CM

## 2017-06-07 DIAGNOSIS — M75111 Incomplete rotator cuff tear or rupture of right shoulder, not specified as traumatic: Secondary | ICD-10-CM | POA: Diagnosis not present
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
## 2017-06-17 ENCOUNTER — Telehealth (INDEPENDENT_AMBULATORY_CARE_PROVIDER_SITE_OTHER): Payer: Self-pay | Admitting: Orthopedic Surgery

## 2017-06-20 ENCOUNTER — Telehealth: Payer: 59 | Admitting: Physician Assistant

## 2017-06-20 DIAGNOSIS — B9789 Other viral agents as the cause of diseases classified elsewhere: Secondary | ICD-10-CM

## 2017-06-20 DIAGNOSIS — J329 Chronic sinusitis, unspecified: Secondary | ICD-10-CM | POA: Diagnosis not present

## 2017-06-20 MED ORDER — FLUTICASONE PROPIONATE 50 MCG/ACT NA SUSP: 2.0000 | Freq: Every day | NASAL | 0 refills | Status: DC

## 2017-06-20 NOTE — Progress Notes (Signed)

## 2017-06-22 DIAGNOSIS — J209 Acute bronchitis, unspecified: Secondary | ICD-10-CM | POA: Diagnosis not present

## 2017-06-24 ENCOUNTER — Ambulatory Visit (INDEPENDENT_AMBULATORY_CARE_PROVIDER_SITE_OTHER): Payer: 59

## 2017-06-24 ENCOUNTER — Ambulatory Visit (INDEPENDENT_AMBULATORY_CARE_PROVIDER_SITE_OTHER): Payer: 59 | Admitting: Physical Medicine and Rehabilitation

## 2017-06-24 ENCOUNTER — Encounter (INDEPENDENT_AMBULATORY_CARE_PROVIDER_SITE_OTHER): Payer: Self-pay | Admitting: Physical Medicine and Rehabilitation

## 2017-06-24 VITALS — BP 136/81 | HR 81

## 2017-06-24 DIAGNOSIS — G8929 Other chronic pain: Secondary | ICD-10-CM | POA: Diagnosis not present

## 2017-06-24 DIAGNOSIS — M25511 Pain in right shoulder: Secondary | ICD-10-CM

## 2017-06-24 NOTE — Progress Notes (Signed)
Andrea Montes - 61 y.o. female MRN [id number]  Date of birth: November 07, 1955  Office Visit Note: Visit Date: 06/24/2017 PCP: Seward Carol, MD Referred by: Seward Carol, MD  Subjective: Chief Complaint  Patient presents with  . Right Shoulder - Pain   HPI: Andrea Montes is a 61 year old female worsening chronic right shoulder pain.  She has a history of prior rotator cuff repair by Dr. Marlou Sa.  She has an MRI of the right shoulder which is reviewed below with tear involving 50-75% of the tendon thickness.  Dr. Marlou Sa requested shoulder arthrogram to rule out full-thickness tear.    ROS Otherwise per HPI.  Assessment & Plan: Visit Diagnoses:  1. Chronic right shoulder pain     Plan: Findings:  Diagnostic fluoroscopic glenohumeral joint arthrogram.  There was excellent flow contrast around the joint without extravasation through the rotator cuff.  Further image evaluation will be done by Dr. Marlou Sa.    Meds & Orders: No orders of the defined types were placed in this encounter.   Orders Placed This Encounter  Procedures  . Large Joint Inj: R glenohumeral  . XR C-ARM NO REPORT    Follow-up: Return if symptoms worsen or fail to improve, for Dr. Marlou Sa.   Procedures: Large Joint Inj: R glenohumeral on 06/24/2017 8:44 AM Indications: pain and diagnostic evaluation Details: 22 G 3.5 in needle, anterior approach  Arthrogram: Yes  Medications: (Isovue Contrast, normal saline, 8 mL) Outcome: tolerated well, no immediate complications  Fluoroscopic guidance utilized to position the needle tip intra-articularly.  Arthrogram showed no extravasation across rotator cuff and good flow intra-articularly.  Further interpretation will be looked at by Dr. Marlou Sa. Procedure, treatment alternatives, risks and benefits explained, specific risks discussed. Consent was given by the patient. Immediately prior to procedure a time out was called to verify the correct patient, procedure, equipment, support  staff and site/side marked as required. Patient was prepped and draped in the usual sterile fashion.      No notes on file   Clinical History: MRI OF THE RIGHT SHOULDER WITHOUT CONTRAST  TECHNIQUE: Multiplanar, multisequence MR imaging of the shoulder was performed. No intravenous contrast was administered.  COMPARISON:  Right shoulder x-rays dated April 15, 2017. Right shoulder MRI dated September 16, 2008.  FINDINGS: Rotator cuff: Evidence of prior rotator cuff repair with several suture anchors in the greater tuberosity. There is bursal and articular surface tearing of the distal supraspinatus tendon involving approximately 50-75% of the tendon thickness. The infraspinatus, teres minor, and subscapularis tendons are intact. Mild subscapularis tendinosis.  Muscles:  Mild fatty infiltration of the infraspinatus muscle.  Biceps long head: Mild tendinosis of the intra-articular portion. Mild tenosynovitis. No dislocation.  Acromioclavicular Joint: Mild arthropathy of the acromioclavicular joint. Type II acromion. Trace subacromial/subdeltoid bursal fluid.  Glenohumeral Joint: Diffuse cartilage thinning with focal full-thickness defect along the anterior inferior glenoid, with underlying subchondral cystic change, progressed when compared to prior study. No joint effusion.  Labrum:  Prior labral debridement.  No definite recurrent tear.  Bones:  No fracture or dislocation.  Other: None.  IMPRESSION: 1. Evidence of prior rotator cuff repair with new bursal and articular surface tearing of the distal supraspinatus tendon involving approximately 50-75% of the tendon thickness in total. 2. Mild biceps tendinosis and tenosynovitis. 3. Progressed moderate glenohumeral degenerative changes with full-thickness cartilage loss along the glenoid and underlying subchondral cystic change. 4. Mild degenerative changes of the acromioclavicular joint.   Electronically  Signed   By: Gwyndolyn Saxon  Marzella Schlein M.D.   On: 06/01/2017 11:27  She reports that  has never smoked. she has never used smokeless tobacco. No results for input(s): HGBA1C, LABURIC in the last 8760 hours.  Objective:  VS:  HT:    WT:   BMI:     BP:136/81  HR:81bpm  TEMP: ( )  RESP:  Physical Exam  Ortho Exam Imaging: Xr C-arm No Report  Result Date: 06/24/2017 Please see Notes or Procedures tab for imaging impression.   Past Medical/Family/Surgical/Social History: Medications & Allergies reviewed per EMR Patient Active Problem List   Diagnosis Date Noted  . Trigger finger, acquired 09/09/2015  . Low back pain radiating to both legs 05/06/2012  . Leg length discrepancy 05/06/2012  . HYPERTENSION, BENIGN 04/11/2009  . LEG EDEMA, BILATERAL 10/08/2008  . 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 OVARIAN FAILURE 09/02/2007  . VITAMIN D DEFICIENCY 09/02/2007  . OBESITY, NOS 10/17/2006  . ANEMIA, IRON DEFICIENCY, UNSPEC. 10/17/2006  . CARPAL TUNNEL SYNDROME 10/17/2006   Past Medical History:  Diagnosis Date  . Anemia    no current med.  . 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 DDD (degenerative disc disease), lumbar   . Dental crowns present   . Edema of lower extremity    bilateral  . Frozen shoulder    surgery 11-04-14  . GERD (gastroesophageal reflux disease)    no current med.  [name] History of blood transfusion   . History of shingles   . Impingement syndrome of left shoulder region 10/2014  . Migraines   . Sleep apnea    uses CPAP nightly  . Vitamin D deficiency   . Wears partial dentures    upper   Family History  Problem Relation Age of Onset  . Kidney disease Brother   . Hypertension Brother   . Leukemia Mother   . Esophageal cancer Maternal Uncle   . Hypertension Brother   . Colon cancer Neg Hx   . Colon polyps Neg Hx   . Rectal cancer Neg Hx   . Stomach cancer Neg Hx    Past Surgical History:  Procedure Laterality  Date  . CARPAL TUNNEL RELEASE Left 04/25/2006  . COLONOSCOPY    . COLONOSCOPY WITH PROPOFOL  10/09/2011  . POLYPECTOMY    . ROTATOR CUFF REPAIR  2010  . SHOULDER ARTHROSCOPY WITH ROTATOR CUFF REPAIR Right 10/04/2008  . VAGINAL HYSTERECTOMY  2001   partial   Social History   Occupational History  . Not on file  Tobacco Use  . Smoking status: Never Smoker  . Smokeless tobacco: Never Used  Substance and Sexual Activity  . Alcohol use: No  . Drug use: No  . Sexual activity: Not on file

## 2017-06-24 NOTE — Progress Notes (Deleted)
Right shoulder pain for 1 month. Was in [place] in July and started noticing pain after. Previous right RCR 10 years ago. Pain is constant. Here today for right shoulder arthrogram.

## 2017-06-24 NOTE — Patient Instructions (Signed)

## 2017-07-14 ENCOUNTER — Emergency Department (HOSPITAL_COMMUNITY): Payer: 59

## 2017-07-14 ENCOUNTER — Encounter (HOSPITAL_COMMUNITY): Payer: Self-pay

## 2017-07-14 ENCOUNTER — Emergency Department (HOSPITAL_COMMUNITY)
Admission: EM | Admit: 2017-07-14 | Discharge: 2017-07-15 | Disposition: A | Discharge status: Home / Self Care | Payer: 59 | Attending: Emergency Medicine | Admitting: Emergency Medicine

## 2017-07-14 ENCOUNTER — Other Ambulatory Visit: Payer: Self-pay

## 2017-07-14 DIAGNOSIS — R197 Diarrhea, unspecified: Secondary | ICD-10-CM | POA: Diagnosis not present

## 2017-07-14 DIAGNOSIS — R079 Chest pain, unspecified: Secondary | ICD-10-CM | POA: Diagnosis not present

## 2017-07-14 DIAGNOSIS — I10 Essential (primary) hypertension: Secondary | ICD-10-CM | POA: Diagnosis not present

## 2017-07-14 DIAGNOSIS — R1013 Epigastric pain: Secondary | ICD-10-CM | POA: Insufficient documentation

## 2017-07-14 DIAGNOSIS — Z79899 Other long term (current) drug therapy: Secondary | ICD-10-CM | POA: Diagnosis not present

## 2017-07-14 DIAGNOSIS — R109 Unspecified abdominal pain: Secondary | ICD-10-CM | POA: Diagnosis not present

## 2017-07-14 DIAGNOSIS — R1084 Generalized abdominal pain: Secondary | ICD-10-CM | POA: Diagnosis not present

## 2017-07-14 DIAGNOSIS — R112 Nausea with vomiting, unspecified: Secondary | ICD-10-CM | POA: Diagnosis not present

## 2017-07-14 DIAGNOSIS — R1110 Vomiting, unspecified: Secondary | ICD-10-CM | POA: Diagnosis not present

## 2017-07-14 LAB — CBC WITH DIFFERENTIAL/PLATELET
BASOS PCT: 0 %
Basophils Absolute: 0 10*3/uL (ref 0.0–0.1)
Eosinophils Absolute: 0.1 10*3/uL (ref 0.0–0.7)
Eosinophils Relative: 0 %
HEMATOCRIT: 48.4 % — AB (ref 36.0–46.0)
Hemoglobin: 15.3 g/dL — ABNORMAL HIGH (ref 12.0–15.0)
Lymphocytes Relative: 6 %
Lymphs Abs: 1.4 10*3/uL (ref 0.7–4.0)
MCH: 22.7 pg — AB (ref 26.0–34.0)
MCHC: 31.6 g/dL (ref 30.0–36.0)
MCV: 71.7 fL — AB (ref 78.0–100.0)
MONO ABS: 0.5 10*3/uL (ref 0.1–1.0)
MONOS PCT: 2 %
NEUTROS ABS: 20.5 10*3/uL — AB (ref 1.7–7.7)
Neutrophils Relative %: 92 %
Platelets: 257 10*3/uL (ref 150–400)
RBC: 6.75 MIL/uL — ABNORMAL HIGH (ref 3.87–5.11)
RDW: 15.6 % — AB (ref 11.5–15.5)
WBC: 22.5 10*3/uL — ABNORMAL HIGH (ref 4.0–10.5)

## 2017-07-14 LAB — COMPREHENSIVE METABOLIC PANEL
ALBUMIN: 4 g/dL (ref 3.5–5.0)
ALK PHOS: 108 U/L (ref 38–126)
ALT: 16 U/L (ref 14–54)
AST: 30 U/L (ref 15–41)
Anion gap: 12 (ref 5–15)
BILIRUBIN TOTAL: 0.8 mg/dL (ref 0.3–1.2)
BUN: 15 mg/dL (ref 6–20)
CO2: 26 mmol/L (ref 22–32)
Calcium: 9.4 mg/dL (ref 8.9–10.3)
Chloride: 103 mmol/L (ref 101–111)
Creatinine, Ser: 1.04 mg/dL — ABNORMAL HIGH (ref 0.44–1.00)
GFR calc Af Amer: >60 mL/min (ref >60)
GFR calc non Af Amer: 58 mL/min — ABNORMAL LOW (ref >60)
GLUCOSE: 158 mg/dL — AB (ref 65–99)
POTASSIUM: 3.9 mmol/L (ref 3.5–5.1)
Sodium: 141 mmol/L (ref 135–145)
TOTAL PROTEIN: 8.8 g/dL — AB (ref 6.5–8.1)

## 2017-07-14 LAB — POC OCCULT BLOOD, ED: Fecal Occult Bld: NEGATIVE

## 2017-07-14 MED ORDER — MORPHINE SULFATE (PF) 4 MG/ML IV SOLN
4.0000 mg | Freq: Once | INTRAVENOUS | Status: AC
  Administered 2017-07-14: 4 mg via INTRAVENOUS
  Filled 2017-07-14: qty 1

## 2017-07-14 MED ORDER — SODIUM CHLORIDE 0.9 % IV BOLUS (SEPSIS)
2000.0000 mL | Freq: Once | INTRAVENOUS | Status: AC
  Administered 2017-07-14: 2000 mL via INTRAVENOUS

## 2017-07-14 MED ORDER — SODIUM CHLORIDE 0.9 % IV BOLUS (SEPSIS): 1000.0000 mL | Freq: Once | INTRAVENOUS | Status: DC

## 2017-07-14 MED ORDER — IOPAMIDOL (ISOVUE-300) INJECTION 61%
100.0000 mL | Freq: Once | INTRAVENOUS | Status: AC | PRN
  Administered 2017-07-14: 100 mL via INTRAVENOUS

## 2017-07-14 NOTE — ED Triage Notes (Signed)
Patient reports of abdominal pain, diarrhea and vomiting since 1630 today. States she may have had blood in stool but unsure.

## 2017-07-14 NOTE — ED Provider Notes (Addendum)
[facility] EMERGENCY DEPARTMENT Provider Note   CSN: [id number] Arrival date & time: 07/14/17  1817     History   Chief Complaint Chief Complaint  Patient presents with  . Abdominal Pain  . Diarrhea    HPI Andrea Montes is a 61 y.o. female.  Planes of diffuse abdominal pain intermittent, with onset this afternoon with approximately 8 episodes of vomiting and 8 episodes of diarrhea prior to coming here.  She denies any nausea at present.  Pain lasts 3 or 4 minutes at a time.  No treatment prior to coming here.  Denies any fever.  Nothing makes symptoms better or worse.  Associated symptoms include generalized weakness no treatment prior to coming here no other associated symptoms.  HPI  Past Medical History:  Diagnosis Date  . Anemia    no current med.  . 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 DDD (degenerative disc disease), lumbar   . Dental crowns present   . Edema of lower extremity    bilateral  . Frozen shoulder    surgery 11-04-14  . GERD (gastroesophageal reflux disease)    no current med.  [name] History of blood transfusion   . History of shingles   . Impingement syndrome of left shoulder region 10/2014  . Migraines   . Sleep apnea    uses CPAP nightly  . Vitamin D deficiency   . Wears partial dentures    upper    Patient Active Problem List   Diagnosis Date Noted  . Trigger finger, acquired 09/09/2015  . Low back pain radiating to both legs 05/06/2012  . Leg length discrepancy 05/06/2012  . HYPERTENSION, BENIGN 04/11/2009  . LEG EDEMA, BILATERAL 10/08/2008  . 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 OVARIAN FAILURE 09/02/2007  . VITAMIN D DEFICIENCY 09/02/2007  . OBESITY, NOS 10/17/2006  . ANEMIA, IRON DEFICIENCY, UNSPEC. 10/17/2006  . CARPAL TUNNEL SYNDROME 10/17/2006    Past Surgical History:  Procedure Laterality Date  . CARPAL TUNNEL RELEASE Left 04/25/2006  . CLOSED MANIPULATION SHOULDER WITH STERIOD  INJECTION Left 02/03/2015   Procedure: LEFT  MANIPULATION SHOULDER UNDER ANESTHESIA WITH INJECTION OF STEROID;  Surgeon: Kathryne Hitch, MD;  Location: [facility];  Service: Orthopedics;  Laterality: Left;  . COLONOSCOPY    . COLONOSCOPY WITH PROPOFOL  10/09/2011  . POLYPECTOMY    . RESECTION DISTAL CLAVICAL Left 11/04/2014   Procedure: RESECTION DISTAL CLAVICAL;  Surgeon: Kathryne Hitch, MD;  Location: [facility];  Service: Orthopedics;  Laterality: Left;  . ROTATOR CUFF REPAIR  2010  . SHOULDER ARTHROSCOPY WITH ROTATOR CUFF REPAIR Right 10/04/2008  . SHOULDER ARTHROSCOPY WITH SUBACROMIAL DECOMPRESSION, ROTATOR CUFF REPAIR AND BICEP TENDON REPAIR Left 11/04/2014   Procedure: LEFT SHOULDER ARTHROSCOPY WITH DEBRIDEMENT, DISTAL CLAVICLE EXCISION, ACROMIOPLASTY, ROTATOR CUFF REPAIR AND BICEP TENODESIS;  Surgeon: Kathryne Hitch, MD;  Location: [place];  Service: Orthopedics;  Laterality: Left;  . TRIGGER FINGER RELEASE Right 02/03/2015   Procedure: RIGHT LONG FINGER TRIGGER RELEASE;  Surgeon: Kathryne Hitch, MD;  Location: [place];  Service: Orthopedics;  Laterality: Right;  [name] VAGINAL HYSTERECTOMY  2001   partial    OB History    No data available       Home Medications    Prior to Admission medications   Medication Sig Start Date End Date Taking? Authorizing Provider  cholecalciferol (VITAMIN D) 1000 UNITS tablet Take 1,000 Units by mouth once a  week.     [provider]  cyclobenzaprine (FLEXERIL) 10 MG tablet Take 1 tablet (10 mg total) by mouth 3 (three) times daily as needed for muscle spasms. Patient not taking: Reported on 11/05/2016 05/24/16   Sherilyn Banker, MD  diazepam (VALIUM) 10 MG tablet Take one pill 1-2 hours prior to MRI 05/27/17   Thurman Coyer, DO  diclofenac (VOLTAREN) 75 MG EC tablet Take 1 tablet (75 mg total) by mouth 2 (two) times daily as needed. Take with food Patient not taking: Reported on  11/05/2016 04/11/16   Thurman Coyer, DO  fluticasone [facility]) 50 MCG/ACT nasal spray Place 2 sprays into both nostrils daily. 06/20/17   Brunetta Jeans, PA-C  furosemide (LASIX) 20 MG tablet Take 1 tablet (20 mg total) by mouth daily. 03/07/16   Josue Hector, MD  meloxicam (MOBIC) 15 MG tablet Take 1 tablet (15 mg total) by mouth daily as needed for pain. 05/02/17   Thurman Coyer, DO  SUMAtriptan (IMITREX) 25 MG tablet  03/06/16   [provider]  traMADol (ULTRAM) 50 MG tablet Take 1 tablet (50 mg total) by mouth every 6 (six) hours as needed. 05/02/17   Thurman Coyer, DO    Family History Family History  Problem Relation Age of Onset  . Kidney disease Brother   . Hypertension Brother   . Leukemia Mother   . Esophageal cancer Maternal Uncle   . Hypertension Brother   . Colon cancer Neg Hx   . Colon polyps Neg Hx   . Rectal cancer Neg Hx   . Stomach cancer Neg Hx     Social History Social History   Tobacco Use  . Smoking status: Never Smoker  . Smokeless tobacco: Never Used  Substance Use Topics  . Alcohol use: No  . Drug use: No     Allergies   Patient has no known allergies.   Review of Systems Review of Systems  HENT: Negative.   Respiratory: Negative.   Cardiovascular: Negative.   Gastrointestinal: Positive for abdominal pain, blood in stool, diarrhea and vomiting.       Questionable blood in stool  Musculoskeletal: Negative.   Skin: Negative.   Neurological: Positive for weakness.  Psychiatric/Behavioral: Negative.   All other systems reviewed and are negative.    Physical Exam Updated Vital Signs BP (!) 160/71 (BP Location: Right Arm)   Pulse 75   Temp 97.7 F (36.5 C) (Oral)   Resp 17   Ht 5\' 3"  (1.6 m)   Wt 120.7 kg (266 lb)   SpO2 100%   BMI 47.12 kg/m   Physical Exam  Constitutional: She appears well-developed and well-nourished.  HENT:  Head: Normocephalic and atraumatic.  Eyes: Conjunctivae are normal. Pupils are  equal, round, and reactive to light.  Neck: Neck supple. No tracheal deviation present. No thyromegaly present.  Cardiovascular: Normal rate and regular rhythm.  No murmur heard. Pulmonary/Chest: Effort normal and breath sounds normal.  Abdominal: Soft. Bowel sounds are normal. She exhibits no distension and no mass. There is tenderness. There is no rebound and no guarding.  Obese mild diffuse tenderness  Genitourinary: Rectum normal. Rectal exam shows no tenderness and guaiac negative stool.  Genitourinary Comments: Rectum normal tone no gross blood  Musculoskeletal: Normal range of motion. She exhibits no edema or tenderness.  Neurological: She is alert. Coordination normal.  Skin: Skin is warm and dry. No rash noted.  Psychiatric: She has a normal mood and affect.  Nursing note and vitals reviewed.    ED Treatments / Results  Labs (all labs ordered are listed, but only abnormal results are displayed) Labs Reviewed  COMPREHENSIVE METABOLIC PANEL  CBC WITH DIFFERENTIAL/PLATELET  POC OCCULT BLOOD, ED    EKG  EKG Interpretation None       Radiology No results found.  Procedures Procedures (including critical care time)  Medications Ordered in ED Medications  sodium chloride 0.9 % bolus 2,000 mL (not administered)      X-rays reviewed by me Results for orders placed or performed during the hospital encounter of 07/14/17  Comprehensive metabolic panel  Result Value Ref Range   Sodium 141 135 - 145 mmol/L   Potassium 3.9 3.5 - 5.1 mmol/L   Chloride 103 101 - 111 mmol/L   CO2 26 22 - 32 mmol/L   Glucose, Bld 158 (H) 65 - 99 mg/dL   BUN 15 6 - 20 mg/dL   Creatinine, Ser 1.04 (H) 0.44 - 1.00 mg/dL   Calcium 9.4 8.9 - 10.3 mg/dL   Total Protein 8.8 (H) 6.5 - 8.1 g/dL   Albumin 4.0 3.5 - 5.0 g/dL   AST 30 15 - 41 U/L   ALT 16 14 - 54 U/L   Alkaline Phosphatase 108 38 - 126 U/L   Total Bilirubin 0.8 0.3 - 1.2 mg/dL   GFR calc non Af Amer 58 (L) >60 mL/min   GFR  calc Af Amer >60 >60 mL/min   Anion gap 12 5 - 15  CBC with Differential/Platelet  Result Value Ref Range   WBC 22.5 (H) 4.0 - 10.5 K/uL   RBC 6.75 (H) 3.87 - 5.11 MIL/uL   Hemoglobin 15.3 (H) 12.0 - 15.0 g/dL   HCT 48.4 (H) 36.0 - 46.0 %   MCV 71.7 (L) 78.0 - 100.0 fL   MCH 22.7 (L) 26.0 - 34.0 pg   MCHC 31.6 30.0 - 36.0 g/dL   RDW 15.6 (H) 11.5 - 15.5 %   Platelets 257 150 - 400 K/uL   Neutrophils Relative % 92 %   Neutro Abs 20.5 (H) 1.7 - 7.7 K/uL   Lymphocytes Relative 6 %   Lymphs Abs 1.4 0.7 - 4.0 K/uL   Monocytes Relative 2 %   Monocytes Absolute 0.5 0.1 - 1.0 K/uL   Eosinophils Relative 0 %   Eosinophils Absolute 0.1 0.0 - 0.7 K/uL   Basophils Relative 0 %   Basophils Absolute 0.0 0.0 - 0.1 K/uL  POC occult blood, ED  Result Value Ref Range   Fecal Occult Bld NEGATIVE NEGATIVE   Dg Abd Acute W/chest  Result Date: 07/14/2017 CLINICAL DATA:  Pain and vomiting EXAM: DG ABDOMEN ACUTE W/ 1V CHEST COMPARISON:  12/12/2012, 05/08/2012 FINDINGS: Single-view chest demonstrates linear atelectasis at the left base. No consolidation or effusion. Normal heart size. Supine and upright views of the abdomen demonstrate no free air beneath the diaphragm. Nonspecific diffuse decreased bowel gas with small amount of colon gas and fluid levels. No abnormal calcifications IMPRESSION: 1. No radiographic evidence for acute cardiopulmonary abnormality 2. Nonspecific diffuse decreased bowel gas. If obstruction is suspected, correlation with CT could be obtained. Electronically Signed   By: Donavan Foil M.D.   On: 07/14/2017 22:58   Xr C-arm No Report  Result Date: 06/24/2017 Please see Notes or Procedures tab for imaging impression.  Initial Impression / Assessment and Plan / ED Course  I have reviewed the triage vital signs and the nursing notes.  Pertinent labs &  imaging results that were available during my care of the patient were reviewed by me and considered in my medical decision making  (see chart for details).     CT scan of the abdomen pelvis ordered as patient has a significant leukocytosis, diffuse abdominal tenderness. 12:15 PM feels improved after treatment with intravenous normal saline bolus , and IV pain morphine.  She feels ready to go home.  She is able to drink without nausea or vomiting.  Prescription Norco, Reglan, avoid dairy, encourage oral hydration.  Imodium for diarrhea.  Follow-up with Dr. Delfina Redwood if not feeling better in 2 days.  Strongly suspect gastroenteritis based on CT scan and history and physical [place] Controlled Substance reporting System queried Final Clinical Impressions(s) / ED Diagnoses  Dx #1 nausea vomiting diarrhea Final diagnoses:  None  #2 generalized abdominal pain  ED Discharge Orders    None       Orlie Dakin, MD 07/15/17 [place], [place], MD 07/15/17 [phone]

## 2017-07-15 MED ORDER — METOCLOPRAMIDE HCL 10 MG PO TABS: 10.0000 mg | ORAL_TABLET | Freq: Four times a day (QID) | ORAL | 0 refills | Status: DC | PRN

## 2017-07-15 MED ORDER — HYDROCODONE-ACETAMINOPHEN 5-325 MG PO TABS: 1.0000 | ORAL_TABLET | Freq: Four times a day (QID) | ORAL | 0 refills | Status: DC | PRN

## 2017-07-15 NOTE — Discharge Instructions (Signed)
Avoid milk or foods containing milk such as cheese or ice cream while having diarrhea.  Take Tylenol for mild pain over the pain medicine prescribed for bad pain.  Do not take Tylenol together with the pain medicine prescribed as the combination can be dangerous to your liver.  Take Imodium as directed for diarrhea.  Call Dr. Delfina Redwood to be seen in the office if not improving in 2 days.  Return if your condition worsens for any reason. Make sure that you drink at least six 8 ounce glasses of water each day in order to stay well-hydrated.

## 2017-08-05 ENCOUNTER — Telehealth (INDEPENDENT_AMBULATORY_CARE_PROVIDER_SITE_OTHER): Payer: Self-pay | Admitting: Orthopedic Surgery

## 2017-08-05 NOTE — Telephone Encounter (Signed)
Returned call to patient left message to call back [phone]

## 2017-08-06 ENCOUNTER — Telehealth (INDEPENDENT_AMBULATORY_CARE_PROVIDER_SITE_OTHER): Payer: Self-pay | Admitting: Orthopedic Surgery

## 2017-08-06 NOTE — Telephone Encounter (Signed)
Patient requesting an appointment on 08/14/17 if possible, she states she's still having problem with arm, after seeing Dr Ernestina Patches as well and nothing was full explained to her after appointment with Dr Ernestina Patches

## 2017-08-06 NOTE — Telephone Encounter (Signed)
This looks like primarily arthritis.  The arthrogram did not show a full-thickness rotator cuff tear.  I think in terms of the shoulder there is nothing really definitively operative to do at this time that is going to give her predictable pain relief with a combination of partial-thickness rotator cuff tear and arthritis in the joint.  In regards to the finger that is something that we can work on if she would like.  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  We could consider injection into the joint.  I do not think there is a predictable surgical problem in the right shoulder at this time

## 2017-08-06 NOTE — Telephone Encounter (Signed)
I tried calling patient. No answer. LM for patient to [facility] to discuss questions that she had an response from Dr Marlou Sa about her concerns.

## 2017-08-06 NOTE — Telephone Encounter (Signed)
Patient returned call asked for a call back on her work number. The number to contact patient is [phone]

## 2017-08-06 NOTE — Telephone Encounter (Signed)
IC s/w patient and scheduled her an appt to see Dr Marlou Sa

## 2017-08-06 NOTE — Telephone Encounter (Signed)
Please advise. Patient is [employer] employee who is wanting to know results of arthrogram that she had done with Dr Ernestina Patches, states no one ever advised of results. Also wants to know what to do about the continued pain she is having. There is no where to work her in on your schedule, you are already over booked tomorrow and next Wednesday. Can you please advise. Thanks.

## 2017-08-14 ENCOUNTER — Other Ambulatory Visit: Payer: Self-pay | Admitting: Pediatrics

## 2017-08-21 ENCOUNTER — Other Ambulatory Visit: Payer: Self-pay | Admitting: Pediatrics

## 2017-08-30 ENCOUNTER — Encounter (INDEPENDENT_AMBULATORY_CARE_PROVIDER_SITE_OTHER): Payer: Self-pay | Admitting: Orthopedic Surgery

## 2017-08-30 ENCOUNTER — Ambulatory Visit (INDEPENDENT_AMBULATORY_CARE_PROVIDER_SITE_OTHER): Payer: 59 | Admitting: Orthopedic Surgery

## 2017-08-30 DIAGNOSIS — M75111 Incomplete rotator cuff tear or rupture of right shoulder, not specified as traumatic: Secondary | ICD-10-CM

## 2017-08-30 DIAGNOSIS — M67442 Ganglion, left hand: Secondary | ICD-10-CM | POA: Diagnosis not present

## 2017-08-30 NOTE — Progress Notes (Signed)
Office Visit Note   Patient: Andrea Montes           Date of Birth: May 03, 1956           MRN: [id number] Visit Date: 08/30/2017 Requested by: Seward Carol, MD [address] [place], [place] [address] PCP: Seward Carol, MD  Subjective: Chief Complaint  Patient presents with  . Right Shoulder - Follow-up    HPI: Andrea Montes is a 62 year old patient with right shoulder pain.  She had a arthrogram which was negative for full-thickness rotator cuff tear.  She is doing much better with her pain in the right shoulder.  On the left long finger she has trigger finger as well as a cyst.  That cyst is interfering with her gripping activities and it is painful when it is hit.  She has not reported much in the way of finger locking recently.  She does state that the cyst has increased in size.              ROS: All systems reviewed are negative as they relate to the chief complaint within the history of present illness.  Patient denies  fevers or chills.   Assessment & Plan: Visit Diagnoses:  1. Incomplete tear of right rotator cuff   2. Ganglion cyst of finger of left hand     Plan: Impression is improvement in right shoulder with good range of motion and fewer symptoms.  In regards to that left finger the cyst has increased in size.  This is likely a cyst emanating from the A1 pulley.  Plan is cyst removal and trigger finger release.  Risk and benefits are discussed.  All questions answered.  Anticipate doing this with IV regional.  Follow-Up Instructions: No Follow-up on file.   Orders:  No orders of the defined types were placed in this encounter.  No orders of the defined types were placed in this encounter.     Procedures: No procedures performed   Clinical Data: No additional findings.  Objective: Vital Signs: There were no vitals taken for this visit.  Physical Exam:   Constitutional: Patient appears well-developed HEENT:  Head: Normocephalic Eyes:EOM  are normal Neck: Normal range of motion Cardiovascular: Normal rate Pulmonary/chest: Effort normal Neurologic: Patient is alert Skin: Skin is warm Psychiatric: Patient has normal mood and affect    Ortho Exam: Orthopedic exam demonstrates improved shoulder range of motion with equivocal impingement signs.  On the left finger there is a palpable cyst about the size of a garden P.  This is tender to palpation at the base of the long finger over the A1 pulley.  Motor sensory function to the finger is intact and all the tendons are functional in terms of flexor and extensor tendons.  Specialty Comments:  No specialty comments available.  Imaging: No results found.   PMFS History: Patient Active Problem List   Diagnosis Date Noted  . Trigger finger, acquired 09/09/2015  . Low back pain radiating to both legs 05/06/2012  . Leg length discrepancy 05/06/2012  . HYPERTENSION, BENIGN 04/11/2009  . LEG EDEMA, BILATERAL 10/08/2008  . 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 OVARIAN FAILURE 09/02/2007  . VITAMIN D DEFICIENCY 09/02/2007  . OBESITY, NOS 10/17/2006  . ANEMIA, IRON DEFICIENCY, UNSPEC. 10/17/2006  . CARPAL TUNNEL SYNDROME 10/17/2006   Past Medical History:  Diagnosis Date  . Anemia    no current med.  . 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  DDD (degenerative disc disease), lumbar   . Dental crowns present   . Edema of lower extremity    bilateral  . Frozen shoulder    surgery 11-04-14  . GERD (gastroesophageal reflux disease)    no current med.  [name] History of blood transfusion   . History of shingles   . Impingement syndrome of left shoulder region 10/2014  . Migraines   . Sleep apnea    uses CPAP nightly  . Vitamin D deficiency   . Wears partial dentures    upper    Family History  Problem Relation Age of Onset  . Kidney disease Brother   . Hypertension Brother   . Leukemia Mother   . Esophageal cancer Maternal  Uncle   . Hypertension Brother   . Colon cancer Neg Hx   . Colon polyps Neg Hx   . Rectal cancer Neg Hx   . Stomach cancer Neg Hx     Past Surgical History:  Procedure Laterality Date  . CARPAL TUNNEL RELEASE Left 04/25/2006  . CLOSED MANIPULATION SHOULDER WITH STERIOD INJECTION Left 02/03/2015   Procedure: LEFT  MANIPULATION SHOULDER UNDER ANESTHESIA WITH INJECTION OF STEROID;  Surgeon: Kathryne Hitch, MD;  Location: [facility];  Service: Orthopedics;  Laterality: Left;  . COLONOSCOPY    . COLONOSCOPY WITH PROPOFOL  10/09/2011  . POLYPECTOMY    . RESECTION DISTAL CLAVICAL Left 11/04/2014   Procedure: RESECTION DISTAL CLAVICAL;  Surgeon: Kathryne Hitch, MD;  Location: [facility];  Service: Orthopedics;  Laterality: Left;  . ROTATOR CUFF REPAIR  2010  . SHOULDER ARTHROSCOPY WITH ROTATOR CUFF REPAIR Right 10/04/2008  . SHOULDER ARTHROSCOPY WITH SUBACROMIAL DECOMPRESSION, ROTATOR CUFF REPAIR AND BICEP TENDON REPAIR Left 11/04/2014   Procedure: LEFT SHOULDER ARTHROSCOPY WITH DEBRIDEMENT, DISTAL CLAVICLE EXCISION, ACROMIOPLASTY, ROTATOR CUFF REPAIR AND BICEP TENODESIS;  Surgeon: Kathryne Hitch, MD;  Location: [facility];  Service: Orthopedics;  Laterality: Left;  . TRIGGER FINGER RELEASE Right 02/03/2015   Procedure: RIGHT LONG FINGER TRIGGER RELEASE;  Surgeon: Kathryne Hitch, MD;  Location: [facility];  Service: Orthopedics;  Laterality: Right;  [name] VAGINAL HYSTERECTOMY  2001   partial   Social History   Occupational History  . Not on file  Tobacco Use  . Smoking status: Never Smoker  . Smokeless tobacco: Never Used  Substance and Sexual Activity  . Alcohol use: No  . Drug use: No  . Sexual activity: Not on file

## 2017-09-06 ENCOUNTER — Telehealth (INDEPENDENT_AMBULATORY_CARE_PROVIDER_SITE_OTHER): Payer: Self-pay | Admitting: Orthopedic Surgery

## 2017-09-06 NOTE — Telephone Encounter (Signed)
Patient called checking on her records request. I told her they were mailed to her as I tried to call her to pick up but when I tried to call, it was disconnected.

## 2017-09-19 ENCOUNTER — Other Ambulatory Visit (INDEPENDENT_AMBULATORY_CARE_PROVIDER_SITE_OTHER): Payer: Self-pay | Admitting: Orthopedic Surgery

## 2017-09-19 DIAGNOSIS — M65332 Trigger finger, left middle finger: Secondary | ICD-10-CM

## 2017-09-26 ENCOUNTER — Other Ambulatory Visit (INDEPENDENT_AMBULATORY_CARE_PROVIDER_SITE_OTHER): Payer: Self-pay | Admitting: Orthopedic Surgery

## 2017-10-07 NOTE — Pre-Procedure Instructions (Signed)
Andrea Montes  10/07/2017      [place], [place] - [address] [facility] [address] [place] [address] Phone: [phone] Fax: [phone]    Your procedure is scheduled on Feb 21.  Report to [facility] Admitting at 945 A.M.  Call this number if you have problems the morning of surgery:  [phone]   Remember:  Do not eat food or drink liquids after midnight.  Take these medicines the morning of surgery with A SIP OF WATER: Cyclobenzaprine (Flexeril) if needed Proair inhaler if needed- bring with you on the day of surgery Sumatriptan (Imitrex) if needed Tramadol (Ultram) if needed   Stop taking Meloxicam (Mobic), BC's, Goody's, Herbal medications, Fish Oil, Ibuprofen, Advil, Motrin, Aleve, Vitamins   Do not wear jewelry, make-up or nail polish.  Do not wear lotions, powders, or perfumes, or deodorant.  Do not shave 48 hours prior to surgery.  Men may shave face and neck.  Do not bring valuables to the hospital.  [facility] is not responsible for any belongings or valuables.  Contacts, dentures or bridgework may not be worn into surgery.  Leave your suitcase in the car.  After surgery it may be brought to your room.  For patients admitted to the hospital, discharge time will be determined by your treatment team.  Patients discharged the day of surgery will not be allowed to drive home.    Special instructions:  [facility] - Preparing for Surgery  Before surgery, you can play an important role.  Because skin is not sterile, your skin needs to be as free of germs as possible.  You can reduce the number of germs on you skin by washing with CHG (chlorahexidine gluconate) soap before surgery.  CHG is an antiseptic cleaner which kills germs and bonds with the skin to continue killing germs even after washing.  Please DO NOT use if you have an allergy to CHG or antibacterial soaps.  If your skin becomes  reddened/irritated stop using the CHG and inform your nurse when you arrive at Short Stay.  Do not shave (including legs and underarms) for at least 48 hours prior to the first CHG shower.  You may shave your face.  Please follow these instructions carefully:   1.  Shower with CHG Soap the night before surgery and the  morning of Surgery.  2.  If you choose to wash your hair, wash your hair first as usual with your  normal shampoo.  3.  After you shampoo, rinse your hair and body thoroughly to remove the Shampoo.  4.  Use CHG as you would any other liquid soap.  You can apply chg directly  to the skin and wash gently with scrungie or a clean washcloth.  5.  Apply the CHG Soap to your body ONLY FROM THE NECK DOWN.  Do not use on open wounds or open sores.  Avoid contact with your eyes, ears, mouth and genitals (private parts).  Wash genitals (private parts)       with your normal soap.  6.  Wash thoroughly, paying special attention to the area where your surgery  will be performed.  7.  Thoroughly rinse your body with warm water from the neck down.  8.  DO NOT shower/wash with your normal soap after using and rinsing off   the CHG Soap.  9.  Pat yourself dry with a clean towel.  10.  Wear clean pajamas.            11.  Place clean sheets on your bed the night of your first shower and do not sleep with pets.  Day of Surgery  Do not apply any lotions/deoderants the morning of surgery.  Please wear clean clothes to the hospital/surgery center.     Please read over the following fact sheets that you were given. Pain Booklet, Coughing and Deep Breathing and Surgical Site Infection Prevention

## 2017-10-08 ENCOUNTER — Other Ambulatory Visit: Payer: Self-pay

## 2017-10-08 ENCOUNTER — Encounter (HOSPITAL_COMMUNITY): Payer: Self-pay

## 2017-10-08 ENCOUNTER — Telehealth (INDEPENDENT_AMBULATORY_CARE_PROVIDER_SITE_OTHER): Payer: Self-pay | Admitting: Orthopedic Surgery

## 2017-10-08 ENCOUNTER — Encounter (HOSPITAL_COMMUNITY)
Admission: RE | Admit: 2017-10-08 | Discharge: 2017-10-08 | Disposition: A | Discharge status: Home / Self Care | Payer: 59 | Source: Ambulatory Visit | Attending: Orthopedic Surgery | Admitting: Orthopedic Surgery

## 2017-10-08 DIAGNOSIS — M65332 Trigger finger, left middle finger: Secondary | ICD-10-CM | POA: Diagnosis not present

## 2017-10-08 DIAGNOSIS — I10 Essential (primary) hypertension: Secondary | ICD-10-CM | POA: Diagnosis not present

## 2017-10-08 DIAGNOSIS — E559 Vitamin D deficiency, unspecified: Secondary | ICD-10-CM | POA: Diagnosis not present

## 2017-10-08 DIAGNOSIS — Z8619 Personal history of other infectious and parasitic diseases: Secondary | ICD-10-CM | POA: Diagnosis not present

## 2017-10-08 DIAGNOSIS — G4730 Sleep apnea, unspecified: Secondary | ICD-10-CM | POA: Diagnosis not present

## 2017-10-08 DIAGNOSIS — Z79899 Other long term (current) drug therapy: Secondary | ICD-10-CM | POA: Diagnosis not present

## 2017-10-08 DIAGNOSIS — L720 Epidermal cyst: Secondary | ICD-10-CM | POA: Diagnosis not present

## 2017-10-08 DIAGNOSIS — K219 Gastro-esophageal reflux disease without esophagitis: Secondary | ICD-10-CM | POA: Diagnosis not present

## 2017-10-08 DIAGNOSIS — M5136 Other intervertebral disc degeneration, lumbar region: Secondary | ICD-10-CM | POA: Diagnosis not present

## 2017-10-08 HISTORY — DX: Other complications of anesthesia, initial encounter: T88.59XA

## 2017-10-08 HISTORY — DX: Adverse effect of unspecified anesthetic, initial encounter: T41.45XA

## 2017-10-08 LAB — BASIC METABOLIC PANEL
Anion gap: 14 (ref 5–15)
BUN: 12 mg/dL (ref 6–20)
CHLORIDE: 104 mmol/L (ref 101–111)
CO2: 21 mmol/L — ABNORMAL LOW (ref 22–32)
Calcium: 8.8 mg/dL — ABNORMAL LOW (ref 8.9–10.3)
Creatinine, Ser: 0.98 mg/dL (ref 0.44–1.00)
GFR calc non Af Amer: >60 mL/min (ref >60)
Glucose, Bld: 136 mg/dL — ABNORMAL HIGH (ref 65–99)
POTASSIUM: 3.6 mmol/L (ref 3.5–5.1)
SODIUM: 139 mmol/L (ref 135–145)

## 2017-10-08 LAB — CBC
HEMATOCRIT: 40.5 % (ref 36.0–46.0)
Hemoglobin: 12.7 g/dL (ref 12.0–15.0)
MCH: 22.1 pg — ABNORMAL LOW (ref 26.0–34.0)
MCHC: 31.4 g/dL (ref 30.0–36.0)
MCV: 70.4 fL — AB (ref 78.0–100.0)
PLATELETS: 233 10*3/uL (ref 150–400)
RBC: 5.75 MIL/uL — ABNORMAL HIGH (ref 3.87–5.11)
RDW: 15.1 % (ref 11.5–15.5)
WBC: 16.9 10*3/uL — AB (ref 4.0–10.5)

## 2017-10-08 NOTE — Progress Notes (Signed)
PCP - Jori Moll Polite Cardiologist - Jenkins Rouge  Chest x-ray - not needed EKG - 10/08/17 Stress Test - denies ECHO - 2010 Cardiac Cath - denies  Sleep Study - 2013 CPAP - at night    Anesthesia review: yes requested anesthesia records from last surgery   Patient denies shortness of breath, fever, cough and chest pain at PAT appointment   Patient verbalized understanding of instructions that were given to them at the PAT appointment. Patient was also instructed that they will need to review over the PAT instructions again at home before surgery.

## 2017-10-08 NOTE — Telephone Encounter (Signed)
Janett Billow with [phone]  left the following message on my voicemail at 2:01 today:    "Patient's WBC came back at 16.9.  Two months ago it was at 22.5.  Please make sure Dr. Marlou Sa is aware of this should he want to order more lab work"

## 2017-10-08 NOTE — Pre-Procedure Instructions (Signed)
Andrea Montes  10/08/2017      [place], [place] - [address] [facility] [address] [place] [address] Phone: [phone] Fax: [phone]    Your procedure is scheduled on Feb 21.  Report to [facility] Admitting at 945 A.M.  Call this number if you have problems the morning of surgery:  [phone]   Remember:  Do not eat food or drink liquids after midnight.  Take these medicines the morning of surgery with A SIP OF WATER: Cyclobenzaprine (Flexeril) if needed Proair inhaler if needed- bring with you on the day of surgery Sumatriptan (Imitrex) if needed Tramadol (Ultram) if needed   7 days prior to surgery STOP taking any Aspirin(unless otherwise instructed by your surgeon), Aleve, Naproxen, Ibuprofen, Motrin, Advil, Goody's, BC's, all herbal medications, fish oil, and all vitamins, Mobic     Do not wear jewelry, make-up or nail polish.  Do not wear lotions, powders, or perfumes, or deodorant.  Do not shave 48 hours prior to surgery.  Men may shave face and neck.  Do not bring valuables to the hospital.  [facility] is not responsible for any belongings or valuables.  Contacts, dentures or bridgework may not be worn into surgery.  Leave your suitcase in the car.  After surgery it may be brought to your room.  For patients admitted to the hospital, discharge time will be determined by your treatment team.  Patients discharged the day of surgery will not be allowed to drive home.    Special instructions:   [facility]- Preparing For Surgery  Before surgery, you can play an important role. Because skin is not sterile, your skin needs to be as free of germs as possible. You can reduce the number of germs on your skin by washing with CHG (chlorahexidine gluconate) Soap before surgery.  CHG is an antiseptic cleaner which kills germs and bonds with the skin to continue killing germs even after washing.  Please  do not use if you have an allergy to CHG or antibacterial soaps. If your skin becomes reddened/irritated stop using the CHG.  Do not shave (including legs and underarms) for at least 48 hours prior to first CHG shower. It is OK to shave your face.  Please follow these instructions carefully.   1. Shower the NIGHT BEFORE SURGERY and the MORNING OF SURGERY with CHG.   2. If you chose to wash your hair, wash your hair first as usual with your normal shampoo.  3. After you shampoo, rinse your hair and body thoroughly to remove the shampoo.  4. Use CHG as you would any other liquid soap. You can apply CHG directly to the skin and wash gently with a scrungie or a clean washcloth.   5. Apply the CHG Soap to your body ONLY FROM THE NECK DOWN.  Do not use on open wounds or open sores. Avoid contact with your eyes, ears, mouth and genitals (private parts). Wash Face and genitals (private parts)  with your normal soap.  6. Wash thoroughly, paying special attention to the area where your surgery will be performed.  7. Thoroughly rinse your body with warm water from the neck down.  8. DO NOT shower/wash with your normal soap after using and rinsing off the CHG Soap.  9. Pat yourself dry with a CLEAN TOWEL.  10. Wear CLEAN PAJAMAS to bed the night before surgery, wear comfortable clothes the morning of surgery  11. Place CLEAN SHEETS on your bed the night of your first shower and DO NOT SLEEP WITH PETS.    Day of Surgery: Do not apply any deodorants/lotions. Please wear clean clothes to the hospital/surgery center.    Please read over the following fact sheets that you were given. Pain Booklet, Coughing and Deep Breathing and Surgical Site Infection Prevention

## 2017-10-08 NOTE — Progress Notes (Addendum)
Notified Dr. Forbes Cellar office of WBC count  1:24 PM 10/09/17 Dr. Marlou Sa said that the [facility] is ok and that the patient just needs to follow up with her PCP

## 2017-10-09 MED ORDER — DEXTROSE 5 % IV SOLN
3.0000 g | INTRAVENOUS | Status: DC
  Filled 2017-10-09: qty 3000

## 2017-10-09 NOTE — Progress Notes (Signed)
Anesthesia Chart Review:  Pt is a 62 year old female scheduled for L hand release trigger L 3rd long finger and cyst excision on 10/10/2017 with Meredith Pel, MD  - PCP is Seward Carol, MD  PMH includes:  HTN (no meds currently), OSA, anemia, GERD. Never smoker. BMI 47.5. S/p L shoulder arthroscopy 11/04/14. S/p L shoulder manipulation 02/03/15  Anesthesia history:  - Pt reports itching after surgery at outside surgical center.  Records from [facility] specialty surgical center and from neurosurgeon's office 06/11/13 for bilateral L4-5 facet injection reviewed.  Pt had injection of kenalog with xylocaine; received fentanyl and versed as pre-meds. Pt c/o itching, had hives and redness on exam on R arm; also had itching on abdomen and back.  Received benadryl and zantac IV and within an hour hives/itching  resolved.   - Pt had GA and interscalene block for shoulder surgery 02/03/15 and 11/04/14 at [facility] without complications. Medications administered for both procedures included fentanyl and versed.    Medications include: Lasix, albuterol  BP 132/61   Temp 36.9 C   Resp 20   Ht 5\' 3"  (1.6 m)   Wt 268 lb 1.6 oz (121.6 kg)   SpO2 97%   BMI 47.49 kg/m   Preoperative labs reviewed.   - WBC 16.9.  - Pt had WBC count 22.5 at ED visit 07/14/17 for likely gastroenteritis. However, last CBC at PCP's office 10/2016 showed a normal WBC count.   - Dr. Marlou Sa is aware of elevated WBC count - I attempted to contact pt to identify if she is acutely ill, but was unsuccessful as her mailbox is full  EKG 10/08/17: NSR. RA enlargement. Non-specific ST-t changes  Pt will need further assessment by assigned anesthesiologist day of surgery.  If no signs/sx acute illness, I anticipate pt can proceed as scheduled.   Willeen Cass, FNP-BC [facility] Short Stay Surgical Center/Anesthesiology Phone: [phone] 10/09/2017 4:18 PM

## 2017-10-09 NOTE — Telephone Encounter (Signed)
Also advised Janett Billow from short stay

## 2017-10-09 NOTE — Telephone Encounter (Signed)
Not a huge problem for the surgery were doing tomorrow but it should be followed up with her primary care provider in about 6 weeks with more blood work

## 2017-10-09 NOTE — Telephone Encounter (Signed)
FYI

## 2017-10-09 NOTE — Telephone Encounter (Signed)
IC sw Rilyn and advised.  

## 2017-10-09 NOTE — H&P (Signed)
Andrea Montes is an 62 y.o. female.   Chief Complaint: Left long finger trigger finger HPI: Andrea Montes is a 62 year old female with left long finger trigger finger.  Been triggering for extended period of time.  She also reports a cyst in the area of the A1 pulley which is painful and hurts her with gripping.  She is tried and failed a course of conservative treatment.  She presents now for further management of the problem.  Past Medical History:  Diagnosis Date  . Anemia    no current med.  . 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 Complication of anesthesia    itching from last surgery that was completed at the surgery center [address] patient was given benedryl, generalized itching and they are not sure where that came from  . DDD (degenerative disc disease), lumbar   . Dental crowns present   . Edema of lower extremity    bilateral  . Frozen shoulder    surgery 11-04-14  . GERD (gastroesophageal reflux disease)    no current med.  [name] History of blood transfusion   . History of shingles   . Hypertension    borderline not on medicaiton at this time  . Impingement syndrome of left shoulder region 10/2014  . Migraines   . Sleep apnea    uses CPAP nightly  . Vitamin D deficiency   . Wears partial dentures    upper    Past Surgical History:  Procedure Laterality Date  . CARPAL TUNNEL RELEASE Left 04/25/2006  . CLOSED MANIPULATION SHOULDER WITH STERIOD INJECTION Left 02/03/2015   Procedure: LEFT  MANIPULATION SHOULDER UNDER ANESTHESIA WITH INJECTION OF STEROID;  Surgeon: Kathryne Hitch, MD;  Location: [facility];  Service: Orthopedics;  Laterality: Left;  . COLONOSCOPY    . COLONOSCOPY WITH PROPOFOL  10/09/2011  . POLYPECTOMY    . RESECTION DISTAL CLAVICAL Left 11/04/2014   Procedure: RESECTION DISTAL CLAVICAL;  Surgeon: Kathryne Hitch, MD;  Location: [facility];  Service: Orthopedics;  Laterality: Left;  . ROTATOR CUFF REPAIR   2010  . SHOULDER ARTHROSCOPY WITH ROTATOR CUFF REPAIR Right 10/04/2008  . SHOULDER ARTHROSCOPY WITH SUBACROMIAL DECOMPRESSION, ROTATOR CUFF REPAIR AND BICEP TENDON REPAIR Left 11/04/2014   Procedure: LEFT SHOULDER ARTHROSCOPY WITH DEBRIDEMENT, DISTAL CLAVICLE EXCISION, ACROMIOPLASTY, ROTATOR CUFF REPAIR AND BICEP TENODESIS;  Surgeon: Kathryne Hitch, MD;  Location: [facility];  Service: Orthopedics;  Laterality: Left;  . TRIGGER FINGER RELEASE Right 02/03/2015   Procedure: RIGHT LONG FINGER TRIGGER RELEASE;  Surgeon: Kathryne Hitch, MD;  Location: [facility];  Service: Orthopedics;  Laterality: Right;  [name] VAGINAL HYSTERECTOMY  2001   partial    Family History  Problem Relation Age of Onset  . Kidney disease Brother   . Hypertension Brother   . Leukemia Mother   . Esophageal cancer Maternal Uncle   . Hypertension Brother   . Colon cancer Neg Hx   . Colon polyps Neg Hx   . Rectal cancer Neg Hx   . Stomach cancer Neg Hx    Social History:  reports that  has never smoked. she has never used smokeless tobacco. She reports that she does not drink alcohol or use drugs.  Allergies: No Known Allergies  No medications prior to admission.    Results for orders placed or performed during the hospital encounter of 10/08/17 (from the past 48 hour(s))  Basic metabolic panel  Status: Abnormal   Collection Time: 10/08/17  8:41 AM  Result Value Ref Range   Sodium 139 135 - 145 mmol/L   Potassium 3.6 3.5 - 5.1 mmol/L   Chloride 104 101 - 111 mmol/L   CO2 21 (L) 22 - 32 mmol/L   Glucose, Bld 136 (H) 65 - 99 mg/dL   BUN 12 6 - 20 mg/dL   Creatinine, Ser 0.98 0.44 - 1.00 mg/dL   Calcium 8.8 (L) 8.9 - 10.3 mg/dL   GFR calc non Af Amer >60 >60 mL/min   GFR calc Af Amer >60 >60 mL/min    Comment: (NOTE) The eGFR has been calculated using the CKD EPI equation. This calculation has not been validated in all clinical situations. eGFR's persistently <60 mL/min signify  possible Chronic Kidney Disease.    Anion gap 14 5 - 15    Comment: Performed at [facility] [address], [place], [place] [address]  CBC     Status: Abnormal   Collection Time: 10/08/17  8:41 AM  Result Value Ref Range   WBC 16.9 (H) 4.0 - 10.5 K/uL   RBC 5.75 (H) 3.87 - 5.11 MIL/uL   Hemoglobin 12.7 12.0 - 15.0 g/dL   HCT 40.5 36.0 - 46.0 %   MCV 70.4 (L) 78.0 - 100.0 fL   MCH 22.1 (L) 26.0 - 34.0 pg   MCHC 31.4 30.0 - 36.0 g/dL   RDW 15.1 11.5 - 15.5 %   Platelets 233 150 - 400 K/uL    Comment: Performed at [facility] [address], [place],  [address]   No results found.  Review of Systems  Musculoskeletal: Positive for joint pain.  All other systems reviewed and are negative.   There were no vitals taken for this visit. Physical Exam  Constitutional: She appears well-developed.  HENT:  Head: Normocephalic.  Eyes: Pupils are equal, round, and reactive to light.  Neck: Normal range of motion.  Cardiovascular: Normal rate.  Respiratory: Effort normal.  Neurological: She is alert.  Skin: Skin is warm.  Psychiatric: She has a normal mood and affect.  Examination of the left hand demonstrates palpable radial pulse.  Patient does have pain and tenderness over the A1 pulley of the left third finger.  Cyst is present in the vicinity of the A1 pulley.  Triggering is also present.  Assessment/Plan Impression is left third trigger finger with ganglion cyst visualized on ultrasound in clinic.  Plan is A1 pulley release with cyst excision.  Risks and benefits are discussed including but not limited to infection nerve vessel damage incomplete pain relief.  Patient understands and wishes to proceed.  All questions answered  Anderson Malta, MD 10/09/2017, 11:13 PM

## 2017-10-10 ENCOUNTER — Ambulatory Visit (HOSPITAL_COMMUNITY): Payer: 59 | Admitting: Emergency Medicine

## 2017-10-10 ENCOUNTER — Ambulatory Visit (HOSPITAL_COMMUNITY): Payer: 59 | Admitting: Anesthesiology

## 2017-10-10 ENCOUNTER — Encounter (HOSPITAL_COMMUNITY)
Admission: RE | Disposition: A | Discharge status: Home / Self Care | Payer: Self-pay | Source: Ambulatory Visit | Attending: Orthopedic Surgery

## 2017-10-10 ENCOUNTER — Ambulatory Visit (HOSPITAL_COMMUNITY)
Admission: RE | Admit: 2017-10-10 | Discharge: 2017-10-10 | Disposition: A | Discharge status: Home / Self Care | Payer: 59 | Source: Ambulatory Visit | Attending: Orthopedic Surgery | Admitting: Orthopedic Surgery

## 2017-10-10 ENCOUNTER — Other Ambulatory Visit: Payer: Self-pay

## 2017-10-10 ENCOUNTER — Encounter (HOSPITAL_COMMUNITY): Payer: Self-pay | Admitting: Anesthesiology

## 2017-10-10 ENCOUNTER — Other Ambulatory Visit: Payer: Self-pay | Admitting: Pediatrics

## 2017-10-10 DIAGNOSIS — I10 Essential (primary) hypertension: Secondary | ICD-10-CM | POA: Insufficient documentation

## 2017-10-10 DIAGNOSIS — Z8619 Personal history of other infectious and parasitic diseases: Secondary | ICD-10-CM | POA: Diagnosis not present

## 2017-10-10 DIAGNOSIS — M65332 Trigger finger, left middle finger: Secondary | ICD-10-CM | POA: Insufficient documentation

## 2017-10-10 DIAGNOSIS — M67442 Ganglion, left hand: Secondary | ICD-10-CM | POA: Diagnosis not present

## 2017-10-10 DIAGNOSIS — M5136 Other intervertebral disc degeneration, lumbar region: Secondary | ICD-10-CM | POA: Insufficient documentation

## 2017-10-10 DIAGNOSIS — E559 Vitamin D deficiency, unspecified: Secondary | ICD-10-CM | POA: Insufficient documentation

## 2017-10-10 DIAGNOSIS — M1990 Unspecified osteoarthritis, unspecified site: Secondary | ICD-10-CM | POA: Diagnosis not present

## 2017-10-10 DIAGNOSIS — Z79899 Other long term (current) drug therapy: Secondary | ICD-10-CM | POA: Diagnosis not present

## 2017-10-10 DIAGNOSIS — K219 Gastro-esophageal reflux disease without esophagitis: Secondary | ICD-10-CM | POA: Insufficient documentation

## 2017-10-10 DIAGNOSIS — G4730 Sleep apnea, unspecified: Secondary | ICD-10-CM | POA: Diagnosis not present

## 2017-10-10 DIAGNOSIS — M71342 Other bursal cyst, left hand: Secondary | ICD-10-CM | POA: Diagnosis not present

## 2017-10-10 DIAGNOSIS — L720 Epidermal cyst: Secondary | ICD-10-CM | POA: Diagnosis not present

## 2017-10-10 HISTORY — DX: Trigger finger, left middle finger: M65.332

## 2017-10-10 HISTORY — PX: TRIGGER FINGER RELEASE: SHX641

## 2017-10-10 SURGERY — RELEASE, A1 PULLEY, FOR TRIGGER FINGER
Anesthesia: Regional | Laterality: Left

## 2017-10-10 MED ORDER — FENTANYL CITRATE (PF) 100 MCG/2ML IJ SOLN
INTRAMUSCULAR | Status: AC
  Filled 2017-10-10: qty 2

## 2017-10-10 MED ORDER — LACTATED RINGERS IV SOLN
INTRAVENOUS | Status: DC
  Administered 2017-10-10 (×2): via INTRAVENOUS

## 2017-10-10 MED ORDER — PROPOFOL 10 MG/ML IV BOLUS
INTRAVENOUS | Status: AC
  Filled 2017-10-10: qty 20

## 2017-10-10 MED ORDER — PHENYLEPHRINE 40 MCG/ML (10ML) SYRINGE FOR IV PUSH (FOR BLOOD PRESSURE SUPPORT)
PREFILLED_SYRINGE | INTRAVENOUS | Status: DC | PRN
  Administered 2017-10-10 (×3): 80 ug via INTRAVENOUS

## 2017-10-10 MED ORDER — LIDOCAINE HCL (PF) 0.5 % IJ SOLN
INTRAMUSCULAR | Status: DC | PRN
  Administered 2017-10-10: 50 mL via INTRAVENOUS

## 2017-10-10 MED ORDER — SODIUM BICARBONATE 4.2 % IV SOLN
INTRAVENOUS | Status: DC | PRN
  Administered 2017-10-10: 5 mL via INTRAVENOUS

## 2017-10-10 MED ORDER — ONDANSETRON HCL 4 MG/2ML IJ SOLN
INTRAMUSCULAR | Status: AC
  Filled 2017-10-10: qty 2

## 2017-10-10 MED ORDER — FENTANYL CITRATE (PF) 100 MCG/2ML IJ SOLN
INTRAMUSCULAR | Status: DC | PRN
  Administered 2017-10-10: 50 ug via INTRAVENOUS
  Administered 2017-10-10: 25 ug via INTRAVENOUS

## 2017-10-10 MED ORDER — ONDANSETRON HCL 4 MG/2ML IJ SOLN
INTRAMUSCULAR | Status: DC | PRN
  Administered 2017-10-10: 4 mg via INTRAVENOUS

## 2017-10-10 MED ORDER — SODIUM BICARBONATE 8.4 % IV SOLN
INTRAVENOUS | Status: AC
  Filled 2017-10-10: qty 50

## 2017-10-10 MED ORDER — CHLORHEXIDINE GLUCONATE 4 % EX LIQD: 60.0000 mL | Freq: Once | CUTANEOUS | Status: DC

## 2017-10-10 MED ORDER — MIDAZOLAM HCL 2 MG/2ML IJ SOLN
INTRAMUSCULAR | Status: AC
  Filled 2017-10-10: qty 2

## 2017-10-10 MED ORDER — PROMETHAZINE HCL 25 MG/ML IJ SOLN: 6.2500 mg | INTRAMUSCULAR | Status: DC | PRN

## 2017-10-10 MED ORDER — DEXAMETHASONE SODIUM PHOSPHATE 10 MG/ML IJ SOLN
INTRAMUSCULAR | Status: AC
  Filled 2017-10-10: qty 1

## 2017-10-10 MED ORDER — BUPIVACAINE HCL (PF) 0.25 % IJ SOLN
INTRAMUSCULAR | Status: DC | PRN
  Administered 2017-10-10: 10 mL

## 2017-10-10 MED ORDER — FENTANYL CITRATE (PF) 100 MCG/2ML IJ SOLN
25.0000 ug | INTRAMUSCULAR | Status: DC | PRN
  Administered 2017-10-10: 25 ug via INTRAVENOUS
  Administered 2017-10-10: 50 ug via INTRAVENOUS

## 2017-10-10 MED ORDER — MIDAZOLAM HCL 5 MG/5ML IJ SOLN
INTRAMUSCULAR | Status: DC | PRN
  Administered 2017-10-10: 2 mg via INTRAVENOUS

## 2017-10-10 MED ORDER — FENTANYL CITRATE (PF) 250 MCG/5ML IJ SOLN
INTRAMUSCULAR | Status: AC
  Filled 2017-10-10: qty 5

## 2017-10-10 MED ORDER — BUPIVACAINE HCL (PF) 0.25 % IJ SOLN
INTRAMUSCULAR | Status: AC
  Filled 2017-10-10: qty 30

## 2017-10-10 MED ORDER — PROPOFOL 10 MG/ML IV BOLUS
INTRAVENOUS | Status: DC | PRN
  Administered 2017-10-10 (×2): 20 mg via INTRAVENOUS
  Administered 2017-10-10: 160 mg via INTRAVENOUS

## 2017-10-10 MED ORDER — DEXAMETHASONE SODIUM PHOSPHATE 10 MG/ML IJ SOLN
INTRAMUSCULAR | Status: DC | PRN
  Administered 2017-10-10: 4 mg via INTRAVENOUS

## 2017-10-10 MED FILL — HYDROCODON-APAP 5-325: 5-325 | 6 days supply | Qty: 25 | Fill #0

## 2017-10-10 SURGICAL SUPPLY — 46 items
BANDAGE ACE 3X5.8 VEL STRL LF (GAUZE/BANDAGES/DRESSINGS) ×4 IMPLANT
BANDAGE ACE 4X5 VEL STRL LF (GAUZE/BANDAGES/DRESSINGS) ×2 IMPLANT
BNDG CMPR 9X4 STRL LF SNTH (GAUZE/BANDAGES/DRESSINGS)
BNDG ELASTIC 2X5.8 VLCR STR LF (GAUZE/BANDAGES/DRESSINGS) ×1 IMPLANT
BNDG ESMARK 4X9 LF (GAUZE/BANDAGES/DRESSINGS) IMPLANT
BNDG GAUZE ELAST 4 BULKY (GAUZE/BANDAGES/DRESSINGS) ×2 IMPLANT
CORDS BIPOLAR (ELECTRODE) ×2 IMPLANT
COVER SURGICAL LIGHT HANDLE (MISCELLANEOUS) ×2 IMPLANT
CUFF TOURNIQUET SINGLE 18IN (TOURNIQUET CUFF) ×2 IMPLANT
CUFF TOURNIQUET SINGLE 24IN (TOURNIQUET CUFF) IMPLANT
DRAPE SURG 17X23 STRL (DRAPES) ×2 IMPLANT
DRSG TEGADERM 4X4.75 (GAUZE/BANDAGES/DRESSINGS) ×1 IMPLANT
DURAPREP 26ML APPLICATOR (WOUND CARE) ×2 IMPLANT
GAUZE SPONGE 4X4 12PLY STRL (GAUZE/BANDAGES/DRESSINGS) ×2 IMPLANT
GAUZE SPONGE 4X4 12PLY STRL LF (GAUZE/BANDAGES/DRESSINGS) ×1 IMPLANT
GAUZE XEROFORM 1X8 LF (GAUZE/BANDAGES/DRESSINGS) ×2 IMPLANT
GLOVE BIOGEL PI IND STRL 8 (GLOVE) ×1 IMPLANT
GLOVE BIOGEL PI INDICATOR 8 (GLOVE) ×1
GLOVE SURG ORTHO 8.0 STRL STRW (GLOVE) ×2 IMPLANT
GOWN STRL REUS W/ TWL LRG LVL3 (GOWN DISPOSABLE) ×2 IMPLANT
GOWN STRL REUS W/ TWL XL LVL3 (GOWN DISPOSABLE) ×1 IMPLANT
GOWN STRL REUS W/TWL LRG LVL3 (GOWN DISPOSABLE) ×4
GOWN STRL REUS W/TWL XL LVL3 (GOWN DISPOSABLE) ×2
KIT BASIN OR (CUSTOM PROCEDURE TRAY) ×2 IMPLANT
KIT ROOM TURNOVER OR (KITS) ×2 IMPLANT
LOOP VESSEL MAXI BLUE (MISCELLANEOUS) IMPLANT
NDL HYPO 25GX1X1/2 BEV (NEEDLE) IMPLANT
NEEDLE HYPO 25GX1X1/2 BEV (NEEDLE) IMPLANT
NS IRRIG 1000ML POUR BTL (IV SOLUTION) ×2 IMPLANT
PACK ORTHO EXTREMITY (CUSTOM PROCEDURE TRAY) ×2 IMPLANT
PAD ARMBOARD 7.5X6 YLW CONV (MISCELLANEOUS) ×4 IMPLANT
PAD CAST 4YDX4 CTTN HI CHSV (CAST SUPPLIES) ×2 IMPLANT
PADDING CAST COTTON 4X4 STRL (CAST SUPPLIES) ×4
SUCTION FRAZIER HANDLE 10FR (MISCELLANEOUS)
SUCTION TUBE FRAZIER 10FR DISP (MISCELLANEOUS) IMPLANT
SUT ETHILON 3 0 PS 1 (SUTURE) ×2 IMPLANT
SUT VIC AB 2-0 CT1 27 (SUTURE)
SUT VIC AB 2-0 CT1 TAPERPNT 27 (SUTURE) IMPLANT
SUT VIC AB 3-0 FS2 27 (SUTURE) IMPLANT
SYR CONTROL 10ML LL (SYRINGE) IMPLANT
SYSTEM CHEST DRAIN TLS 7FR (DRAIN) IMPLANT
TOWEL OR 17X24 6PK STRL BLUE (TOWEL DISPOSABLE) ×2 IMPLANT
TOWEL OR 17X26 10 PK STRL BLUE (TOWEL DISPOSABLE) ×2 IMPLANT
TUBE CONNECTING 12X1/4 (SUCTIONS) IMPLANT
UNDERPAD 30X30 (UNDERPADS AND DIAPERS) ×2 IMPLANT
WATER STERILE IRR 1000ML POUR (IV SOLUTION) ×2 IMPLANT

## 2017-10-10 NOTE — Anesthesia Postprocedure Evaluation (Signed)
Anesthesia Post Note  Patient: COSTELLA SCHWARZ  Procedure(s) Performed: LEFT HAND RELEASE TRIGGER FINGER 3RD LONG FINGER AND CYST EXCISION (Left )     Patient location during evaluation: PACU Anesthesia Type: Bier Block and General Level of consciousness: sedated Pain management: pain level controlled Vital Signs Assessment: post-procedure vital signs reviewed and stable Respiratory status: spontaneous breathing and respiratory function stable Cardiovascular status: stable Postop Assessment: no apparent nausea or vomiting Anesthetic complications: no    Last Vitals:  Vitals:   10/10/17 1341 10/10/17 1345  BP: 128/62   Pulse: 70 73  Resp: 17 (!) 23  Temp:    SpO2: 97% 95%    Last Pain:  Vitals:   10/10/17 1400  TempSrc:   PainSc: 10-Worst pain ever                 Mallie Linnemann DANIEL

## 2017-10-10 NOTE — Interval H&P Note (Signed)
History and Physical Interval Note:  10/10/2017 12:02 PM  Andrea Montes  has presented today for surgery, with the diagnosis of left hand 3rd long finger cyst and trigger finger  The various methods of treatment have been discussed with the patient and family. After consideration of risks, benefits and other options for treatment, the patient has consented to  Procedure(s): LEFT HAND RELEASE TRIGGER FINGER 3RD LONG FINGER AND CYST EXCISION (Left) as a surgical intervention .  The patient's history has been reviewed, patient examined, no change in status, stable for surgery.  I have reviewed the patient's chart and labs.  Questions were answered to the patient's satisfaction.     Anderson Malta

## 2017-10-10 NOTE — Brief Op Note (Signed)
10/10/2017  1:25 PM  PATIENT:  Andrea Montes  62 y.o. female  PRE-OPERATIVE DIAGNOSIS:  left hand 3rd long finger cyst and trigger finger  POST-OPERATIVE DIAGNOSIS:  left hand 3rd long finger cyst and trigger finger  PROCEDURE:  Procedure(s): LEFT HAND RELEASE TRIGGER FINGER 3RD LONG FINGER AND CYST EXCISION  SURGEON:  Surgeon(s): Marlou Sa, Tonna Corner, MD  ASSISTANT: Laure Kidney rnfa  ANESTHESIA:   general  EBL: 1 ml    Total I/O In: 900 [I.V.:900] Out: 20 [Blood:20]  BLOOD ADMINISTERED: none  DRAINS: none   LOCAL MEDICATIONS USED:  Plain marcaine SPECIMEN:  Cyst to path  COUNTS:  YES  TOURNIQUET:  * Missing tourniquet times found for documented tourniquets in log: [id number] *  DICTATION: .Other Dictation: Dictation Number [id number]  PLAN OF CARE: Discharge to home after PACU  PATIENT DISPOSITION:  PACU - hemodynamically stable

## 2017-10-10 NOTE — Anesthesia Procedure Notes (Signed)
Procedure Name: MAC Date/Time: 10/10/2017 12:35 PM Performed by: Renato Shin, CRNA Pre-anesthesia Checklist: Patient identified, Emergency Drugs available, Suction available and Patient being monitored Patient Re-evaluated:Patient Re-evaluated prior to induction Oxygen Delivery Method: Nasal cannula Induction Type: IV induction Placement Confirmation: positive ETCO2,  CO2 detector and breath sounds checked- equal and bilateral Dental Injury: Teeth and Oropharynx as per pre-operative assessment

## 2017-10-10 NOTE — Anesthesia Procedure Notes (Signed)
Procedure Name: LMA Insertion Date/Time: 10/10/2017 12:54 PM Performed by: Renato Shin, CRNA Pre-anesthesia Checklist: Patient identified, Emergency Drugs available, Suction available, Patient being monitored and Timeout performed Patient Re-evaluated:Patient Re-evaluated prior to induction Oxygen Delivery Method: Circle system utilized Preoxygenation: Pre-oxygenation with 100% oxygen Induction Type: IV induction LMA: LMA inserted LMA Size: 4.0 Number of attempts: 1 Placement Confirmation: CO2 detector,  positive ETCO2 and breath sounds checked- equal and bilateral Tube secured with: Tape Dental Injury: Teeth and Oropharynx as per pre-operative assessment

## 2017-10-10 NOTE — Anesthesia Preprocedure Evaluation (Addendum)
Anesthesia Evaluation  Patient identified by MRN, date of birth, ID band Patient awake    Reviewed: Allergy & Precautions, NPO status , Patient's Chart, lab work & pertinent test results  Airway Mallampati: II  TM Distance: >3 FB Neck ROM: Full    Dental no notable dental hx. (+) Dental Advisory Given   Pulmonary sleep apnea and Continuous Positive Airway Pressure Ventilation ,    Pulmonary exam normal        Cardiovascular hypertension, Normal cardiovascular exam     Neuro/Psych  Headaches, negative psych ROS   GI/Hepatic Neg liver ROS, GERD  ,  Endo/Other  negative endocrine ROS  Renal/GU negative Renal ROS     Musculoskeletal  (+) Arthritis ,   Abdominal   Peds  Hematology   Anesthesia Other Findings   Reproductive/Obstetrics                           Anesthesia Physical Anesthesia Plan  ASA: II  Anesthesia Plan: MAC and Bier Block and Bier Block-LIDOCAINE ONLY   Post-op Pain Management:    Induction:   PONV Risk Score and Plan: 2 and Ondansetron and Propofol infusion  Airway Management Planned: Natural Airway and Simple Face Mask  Additional Equipment:   Intra-op Plan:   Post-operative Plan:   Informed Consent: I have reviewed the patients History and Physical, chart, labs and discussed the procedure including the risks, benefits and alternatives for the proposed anesthesia with the patient or authorized representative who has indicated his/her understanding and acceptance.   Dental advisory given  Plan Discussed with: CRNA, Anesthesiologist and Surgeon  Anesthesia Plan Comments:        Anesthesia Quick Evaluation

## 2017-10-10 NOTE — Anesthesia Procedure Notes (Signed)
Anesthesia Regional Block: Bier block (IV Regional)   Pre-Anesthetic Checklist: ,, timeout performed, Correct Patient, Correct Site, Correct Laterality, Correct Procedure, Correct Position, site marked, Risks and benefits discussed,  Surgical consent,  Pre-op evaluation,  At surgeon's request and post-op pain management  Laterality: Left and Upper  Prep: chloraprep        Procedures:,,,,,, Esmarch exsanguination,, #20gu IV placed and double tourniquet utilized  Narrative:  Start time: 10/10/2017 12:32 PM End time: 10/10/2017 12:35 PM  Performed by: With CRNAs  CRNA: Renato Shin, CRNA  Additional Notes: 20 g PIV in situ, left hand. Pt to OR. Placed on monitors. O2 [place]. IV sedation to pt comfort. Double tourniquet to left arm. Exsanguinated with esmark, tourniquet to 255mmHg. Fingers blanched, radial pulse undetectable. 56ml 0.5% Lido PF with sodium bicarb injected with ease. Pt tolerate well, VSS. No adverse effects to bier block noted.

## 2017-10-10 NOTE — Transfer of Care (Signed)
Immediate Anesthesia Transfer of Care Note  Patient: Andrea Montes  Procedure(s) Performed: LEFT HAND RELEASE TRIGGER FINGER 3RD LONG FINGER AND CYST EXCISION (Left )  Patient Location: PACU  Anesthesia Type:General  Level of Consciousness: awake, alert , oriented and patient cooperative  Airway & Oxygen Therapy: Patient Spontanous Breathing and Patient connected to nasal cannula oxygen  Post-op Assessment: Report given to RN and Post -op Vital signs reviewed and stable  Post vital signs: Reviewed and stable  Last Vitals:  Vitals:   10/10/17 1032  BP: (!) 154/60  Pulse: 79  Resp: 20  Temp: 36.7 C  SpO2: 96%    Last Pain:  Vitals:   10/10/17 1032  TempSrc: Oral  PainSc: 3       Patients Stated Pain Goal: 3 (03/40/35 2481)  Complications: No apparent anesthesia complications

## 2017-10-11 ENCOUNTER — Encounter (HOSPITAL_COMMUNITY): Payer: Self-pay | Admitting: Orthopedic Surgery

## 2017-10-11 ENCOUNTER — Telehealth (INDEPENDENT_AMBULATORY_CARE_PROVIDER_SITE_OTHER): Payer: Self-pay

## 2017-10-11 NOTE — Telephone Encounter (Signed)
Note written and faxed. Patient advised done.

## 2017-10-11 NOTE — Op Note (Signed)
NAME:  Andrea Montes, Andrea Montes                  ACCOUNT NO.:  MEDICAL RECORD NO.:  [id number]  LOCATION:                                 FACILITY:  PHYSICIAN:  Anderson Malta, M.D.         DATE OF BIRTH:  DATE OF PROCEDURE: DATE OF DISCHARGE:                              OPERATIVE REPORT   PREOPERATIVE DIAGNOSIS:  Left trigger finger, third finger with cyst.  POSTOPERATIVE DIAGNOSIS:  Left trigger finger, third finger with cyst.  PROCEDURE:  Left trigger finger release with cyst removal.  SURGEON:  Anderson Malta, M.D.  ASSIST:  Laure Kidney, RNFA.  ANESTHESIA:  General.  INDICATIONS:  [name] is a 62 year old patient with left trigger finger of the third finger along with a ganglion cyst emanating from beneath the A1 pulley.  She presents now for operative management after explanation of risks and benefits.  PROCEDURE IN DETAIL:  The patient was brought to the operating room where IV regional anesthetic was induced.  This had to be supplemented with general anesthetic.  Arm was prescrubbed with alcohol and Betadine and allowed to air dry, prepped with DuraPrep solution, draped in sterile manner.  DuraPrep was utilized.  A time-out was called. Incision was made over the A1 pulley.  Skin and subcutaneous tissue were sharply divided.  Retractors were placed to retract the neurovascular bundles radial and ulnar to this ganglion cyst and the pulley.  Ganglion cyst was excised.  It was emanating directly from the A1 pulley.  The A1 pulley was then visualized proximally and distally and divided under direct visualization.  Finger was taken through a range of motion and found to have no further triggering.  The cyst measured about 1 x 1 cm and was sent to pathology.  Thorough irrigation was performed.  Skin edges anesthetized.  Tourniquet released.  Bleeding points encountered were controlled using bipolar electrocautery.  Four simple nylon sutures were placed along with an impervious  dressing and an Ace wrap.  The patient tolerated the procedure well without immediate complications. Transferred to the recovery room in stable condition.     Anderson Malta, M.D.    GSD/MEDQ  D:  10/10/2017  T:  10/10/2017  Job:  [id number]

## 2017-10-11 NOTE — Telephone Encounter (Signed)
Pt had surgery with Dr. Marlou Sa yesterday on her left hand. She states that she needs a work note to be out of work for 7 days as per Dr. Forbes Cellar instruction after surgery yesterday. Please call [phone] pt would like this note faxed to [phone] ATT: Glenna Christco she asked to please call her first before faxing to verify number she thinks this is correct but said she will double check while awaiting return call.

## 2017-10-14 ENCOUNTER — Other Ambulatory Visit: Payer: Self-pay | Admitting: Pediatrics

## 2017-10-17 DIAGNOSIS — H5203 Hypermetropia, bilateral: Secondary | ICD-10-CM | POA: Diagnosis not present

## 2017-10-18 ENCOUNTER — Encounter (INDEPENDENT_AMBULATORY_CARE_PROVIDER_SITE_OTHER): Payer: Self-pay | Admitting: Orthopedic Surgery

## 2017-10-18 ENCOUNTER — Ambulatory Visit (INDEPENDENT_AMBULATORY_CARE_PROVIDER_SITE_OTHER): Payer: 59 | Admitting: Orthopedic Surgery

## 2017-10-18 DIAGNOSIS — M67442 Ganglion, left hand: Secondary | ICD-10-CM

## 2017-10-18 NOTE — Progress Notes (Signed)
Post-Op Visit Note   Patient: Andrea Montes           Date of Birth: 1956-03-28           MRN: [id number] Visit Date: 10/18/2017 PCP: Seward Carol, MD   Assessment & Plan:  Chief Complaint:  Chief Complaint  Patient presents with  . Left Hand - Routine Post Op   Visit Diagnoses:  1. Ganglion cyst of finger of left hand     Plan: Andrea Montes is a patient is now 8 days out cyst excision and trigger finger release.  On exam the incision is intact.  Finger moves well.  Plan at this time is to remove the sutures.  Avoid extension of the finger.  Okay to return to work on Monday.  The patient has been out of work from 10/10/2017 until 10/20/2017.  I will see her back as needed  Follow-Up Instructions: Return if symptoms worsen or fail to improve.   Orders:  No orders of the defined types were placed in this encounter.  No orders of the defined types were placed in this encounter.   Imaging: No results found.  PMFS History: Patient Active Problem List   Diagnosis Date Noted  . Trigger finger, acquired 09/09/2015  . Low back pain radiating to both legs 05/06/2012  . Leg length discrepancy 05/06/2012  . HYPERTENSION, BENIGN 04/11/2009  . LEG EDEMA, BILATERAL 10/08/2008  . 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 OVARIAN FAILURE 09/02/2007  . VITAMIN D DEFICIENCY 09/02/2007  . OBESITY, NOS 10/17/2006  . ANEMIA, IRON DEFICIENCY, UNSPEC. 10/17/2006  . CARPAL TUNNEL SYNDROME 10/17/2006   Past Medical History:  Diagnosis Date  . Anemia    no current med.  . 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 Complication of anesthesia    itching from last surgery that was completed at the surgery center off of [address] patient was given benedryl, generalized itching and they are not sure where that came from  . DDD (degenerative disc disease), lumbar   . Dental crowns present   . Edema of lower extremity    bilateral  . Frozen shoulder    surgery 11-04-14  . GERD (gastroesophageal reflux disease)    no current med.  [name] History of blood transfusion   . History of shingles   . Hypertension    borderline not on medicaiton at this time  . Impingement syndrome of left shoulder region 10/2014  . Migraines   . Sleep apnea    uses CPAP nightly  . Trigger finger, left middle finger   . Vitamin D deficiency   . Wears partial dentures    upper    Family History  Problem Relation Age of Onset  . Kidney disease Brother   . Hypertension Brother   . Leukemia Mother   . Esophageal cancer Maternal Uncle   . Hypertension Brother   . Colon cancer Neg Hx   . Colon polyps Neg Hx   . Rectal cancer Neg Hx   . Stomach cancer Neg Hx     Past Surgical History:  Procedure Laterality Date  . CARPAL TUNNEL RELEASE Left 04/25/2006  . CLOSED MANIPULATION SHOULDER WITH STERIOD INJECTION Left 02/03/2015   Procedure: LEFT  MANIPULATION SHOULDER UNDER ANESTHESIA WITH INJECTION OF STEROID;  Surgeon: Kathryne Hitch, MD;  Location: [facility];  Service: Orthopedics;  Laterality: Left;  . COLONOSCOPY    . COLONOSCOPY WITH PROPOFOL  10/09/2011  .  POLYPECTOMY    . RESECTION DISTAL CLAVICAL Left 11/04/2014   Procedure: RESECTION DISTAL CLAVICAL;  Surgeon: Kathryne Hitch, MD;  Location: [facility];  Service: Orthopedics;  Laterality: Left;  . ROTATOR CUFF REPAIR  2010  . SHOULDER ARTHROSCOPY WITH ROTATOR CUFF REPAIR Right 10/04/2008  . SHOULDER ARTHROSCOPY WITH SUBACROMIAL DECOMPRESSION, ROTATOR CUFF REPAIR AND BICEP TENDON REPAIR Left 11/04/2014   Procedure: LEFT SHOULDER ARTHROSCOPY WITH DEBRIDEMENT, DISTAL CLAVICLE EXCISION, ACROMIOPLASTY, ROTATOR CUFF REPAIR AND BICEP TENODESIS;  Surgeon: Kathryne Hitch, MD;  Location: [facility];  Service: Orthopedics;  Laterality: Left;  . TRIGGER FINGER RELEASE Right 02/03/2015   Procedure: RIGHT LONG FINGER TRIGGER RELEASE;  Surgeon: Kathryne Hitch, MD;  Location: [facility];  Service: Orthopedics;  Laterality: Right;  . TRIGGER FINGER RELEASE Left 10/10/2017   Procedure: LEFT HAND RELEASE TRIGGER FINGER 3RD LONG FINGER AND CYST EXCISION;  Surgeon: Meredith Pel, MD;  Location: [facility];  Service: Orthopedics;  Laterality: Left;  [name] VAGINAL HYSTERECTOMY  2001   partial   Social History   Occupational History  . Not on file  Tobacco Use  . Smoking status: Never Smoker  . Smokeless tobacco: Never Used  Substance and Sexual Activity  . Alcohol use: No  . Drug use: No  . Sexual activity: Not on file

## 2017-10-23 ENCOUNTER — Inpatient Hospital Stay (INDEPENDENT_AMBULATORY_CARE_PROVIDER_SITE_OTHER): Payer: 59 | Admitting: Orthopedic Surgery

## 2017-10-28 ENCOUNTER — Ambulatory Visit: Payer: Self-pay | Admitting: Emergency Medicine

## 2017-10-28 VITALS — BP 140/90 | HR 66 | Temp 98.0°F | Resp 16 | Wt 271.2 lb

## 2017-10-28 DIAGNOSIS — N3001 Acute cystitis with hematuria: Secondary | ICD-10-CM

## 2017-10-28 DIAGNOSIS — L98491 Non-pressure chronic ulcer of skin of other sites limited to breakdown of skin: Secondary | ICD-10-CM

## 2017-10-28 DIAGNOSIS — R300 Dysuria: Secondary | ICD-10-CM

## 2017-10-28 LAB — POCT URINALYSIS DIPSTICK
Glucose, UA: NEGATIVE
KETONES UA: NEGATIVE
Nitrite, UA: NEGATIVE
SPEC GRAV UA: 1.015 (ref 1.010–1.025)
Urobilinogen, UA: 0.2 E.U./dL
pH, UA: 6 (ref 5.0–8.0)

## 2017-10-28 MED ORDER — PHENAZOPYRIDINE HCL 100 MG PO TABS: 100.0000 mg | ORAL_TABLET | Freq: Three times a day (TID) | ORAL | 0 refills | Status: DC | PRN

## 2017-10-28 MED ORDER — CEPHALEXIN 500 MG PO CAPS: 500.0000 mg | ORAL_CAPSULE | Freq: Four times a day (QID) | ORAL | 0 refills | Status: DC

## 2017-10-28 MED ORDER — BACITRACIN 500 UNIT/GM EX OINT: 1.0000 "application " | TOPICAL_OINTMENT | Freq: Two times a day (BID) | CUTANEOUS | 0 refills | Status: DC

## 2017-10-28 MED ORDER — FLUCONAZOLE 200 MG PO TABS: ORAL_TABLET | ORAL | 1 refills | Status: DC

## 2017-10-28 NOTE — Progress Notes (Signed)
Subjective:    Andrea Montes is a 62 y.o. female who complains of abnormal smelling urine, burning with urination, dysuria, foul smelling urine, hesitancy, incomplete bladder emptying and suprapubic pressure for 2 days.  Patient also complains of pain to the lower back. Patient denies fever and vaginal discharge.  Patient does have a history of recurrent UTI.  Patient does not have a history of pyelonephritis.   With regard to the lower back pain, patient states she is not sure if she has ingrown hair or shingles. Reports it is in the same place as previous shingles outbreaks, but does not feel the same. Does not feel burning, tingling, or numbness, describes a dull ache, worse with pressure.  Review of Systems Pertinent items noted in HPI and remainder of comprehensive ROS otherwise negative.    Objective:    BP 140/90 (BP Location: Right Arm, Patient Position: Sitting, Cuff Size: Large)   Pulse 66   Temp 98 F (36.7 C) (Oral)   Resp 16   Wt 271 lb 3.2 oz (123 kg)   SpO2 96%   BMI 48.04 kg/m   Physical Exam  Constitutional: She appears well-developed and well-nourished. No distress.  HENT:  Head: Normocephalic.  Right Ear: External ear normal.  Left Ear: External ear normal.  Eyes: Conjunctivae are normal.  Cardiovascular: Normal rate and regular rhythm.  Pulmonary/Chest: Effort normal and breath sounds normal.  Abdominal: There is no CVA tenderness.  Genitourinary:     Neurological: She is alert.  Skin: Skin is warm and dry. Capillary refill takes less than 2 seconds. She is not diaphoretic.  Nursing note and vitals reviewed.  Laboratory:  Urine dipstick shows sp gravity 1.015, negative for glucose, 3+ for hemoglobin, negative for ketones, 3+ for leukocyte esterase, negative for nitrites, trace for protein and trace for urobilinogen.   Micro exam: not done.    Assessment:    Acute cystitis    Plan: Plan:    1. Medications: Keflex 2. Maintain adequate  hydration 3. Follow up if symptoms not improving, and prn.   Bacitracin for skin ulcer, return as needed

## 2017-10-28 NOTE — Patient Instructions (Signed)

## 2017-10-30 ENCOUNTER — Telehealth: Payer: Self-pay

## 2017-10-30 NOTE — Telephone Encounter (Signed)
Tried to call and f/u with pt to see how she was feeling since her visit with [name] but pt mailbox was full.

## 2017-12-29 ENCOUNTER — Encounter: Payer: Self-pay | Admitting: Nurse Practitioner

## 2017-12-29 ENCOUNTER — Ambulatory Visit (INDEPENDENT_AMBULATORY_CARE_PROVIDER_SITE_OTHER): Payer: Self-pay | Admitting: Nurse Practitioner

## 2017-12-29 VITALS — BP 128/78 | HR 93 | Temp 98.3°F | Resp 22 | Wt 269.6 lb

## 2017-12-29 DIAGNOSIS — J209 Acute bronchitis, unspecified: Secondary | ICD-10-CM

## 2017-12-29 DIAGNOSIS — J0140 Acute pansinusitis, unspecified: Secondary | ICD-10-CM

## 2017-12-29 MED ORDER — HYDROCODONE-HOMATROPINE 5-1.5 MG/5ML PO SYRP: 5.0000 mL | ORAL_SOLUTION | Freq: Four times a day (QID) | ORAL | 0 refills | Status: AC | PRN

## 2017-12-29 MED ORDER — ALBUTEROL SULFATE HFA 108 (90 BASE) MCG/ACT IN AERS: 2.0000 | INHALATION_SPRAY | Freq: Four times a day (QID) | RESPIRATORY_TRACT | 0 refills | Status: DC | PRN

## 2017-12-29 MED ORDER — BENZONATATE 200 MG PO CAPS: 200.0000 mg | ORAL_CAPSULE | Freq: Three times a day (TID) | ORAL | 0 refills | Status: AC | PRN

## 2017-12-29 MED ORDER — FLUTICASONE PROPIONATE 50 MCG/ACT NA SUSP: 2.0000 | Freq: Every day | NASAL | 0 refills | Status: DC

## 2017-12-29 MED ORDER — AMOXICILLIN-POT CLAVULANATE 875-125 MG PO TABS: 1.0000 | ORAL_TABLET | Freq: Two times a day (BID) | ORAL | 0 refills | Status: AC

## 2017-12-29 NOTE — Patient Instructions (Signed)
Acute Bronchitis, Adult Acute bronchitis is sudden (acute) swelling of the air tubes (bronchi) in the lungs. Acute bronchitis causes these tubes to fill with mucus, which can make it hard to breathe. It can also cause coughing or wheezing. In adults, acute bronchitis usually goes away within 2 weeks. A cough caused by bronchitis may last up to 3 weeks. Smoking, allergies, and asthma can make the condition worse. Repeated episodes of bronchitis may cause further lung problems, such as chronic obstructive pulmonary disease (COPD). What are the causes? This condition can be caused by germs and by substances that irritate the lungs, including:  Cold and flu viruses. This condition is most often caused by the same virus that causes a cold.  Bacteria.  Exposure to tobacco smoke, dust, fumes, and air pollution.  What increases the risk? This condition is more likely to develop in people who:  Have close contact with someone with acute bronchitis.  Are exposed to lung irritants, such as tobacco smoke, dust, fumes, and vapors.  Have a weak immune system.  Have a respiratory condition such as asthma.  What are the signs or symptoms? Symptoms of this condition include:  A cough.  Coughing up clear, yellow, or green mucus.  Wheezing.  Chest congestion.  Shortness of breath.  A fever.  Body aches.  Chills.  A sore throat.  How is this diagnosed? This condition is usually diagnosed with a physical exam. During the exam, your health care provider may order tests, such as chest X-rays, to rule out other conditions. He or she may also:  Test a sample of your mucus for bacterial infection.  Check the level of oxygen in your blood. This is done to check for pneumonia.  Do a chest X-ray or lung function testing to rule out pneumonia and other conditions.  Perform blood tests.  Your health care provider will also ask about your symptoms and medical history. How is this  treated? Most cases of acute bronchitis clear up over time without treatment. Your health care provider may recommend:  Drinking more fluids. Drinking more makes your mucus thinner, which may make it easier to breathe.  Taking a medicine for a fever or cough.  Taking an antibiotic medicine.  Using an inhaler to help improve shortness of breath and to control a cough.  Using a cool mist vaporizer or humidifier to make it easier to breathe.  Follow these instructions at home: Medicines  Take over-the-counter and prescription medicines only as told by your health care provider.  If you were prescribed an antibiotic, take it as told by your health care provider. Do not stop taking the antibiotic even if you start to feel better. General instructions  Get plenty of rest.  Drink enough fluids to keep your urine clear or pale yellow.  Avoid smoking and secondhand smoke. Exposure to cigarette smoke or irritating chemicals will make bronchitis worse. If you smoke and you need help quitting, ask your health care provider. Quitting smoking will help your lungs heal faster.  Use an inhaler, cool mist vaporizer, or humidifier as told by your health care provider.  Keep all follow-up visits as told by your health care provider. This is important. How is this prevented? To lower your risk of getting this condition again:  Wash your hands often with soap and water. If soap and water are not available, use hand sanitizer.  Avoid contact with people who have cold symptoms.  Try not to touch your hands to your   mouth, nose, or eyes.  Make sure to get the flu shot every year.  Contact a health care provider if:  Your symptoms do not improve in 2 weeks of treatment. Get help right away if:  You cough up blood.  You have chest pain.  You have severe shortness of breath.  You become dehydrated.  You faint or keep feeling like you are going to faint.  You keep vomiting.  You have a  severe headache.  Your fever or chills gets worse. This information is not intended to replace advice given to you by your health care provider. Make sure you discuss any questions you have with your health care provider. Document Released: 09/13/2004 Document Revised: 02/29/2016 Document Reviewed: 01/25/2016 Elsevier Interactive Patient Education  2018 [employer].  Sinusitis, Adult Sinusitis is soreness and inflammation of your sinuses. Sinuses are hollow spaces in the bones around your face. Your sinuses are located:  Around your eyes.  In the middle of your forehead.  Behind your nose.  In your cheekbones.  Your sinuses and nasal passages are lined with a stringy fluid (mucus). Mucus normally drains out of your sinuses. When your nasal tissues become inflamed or swollen, the mucus can become trapped or blocked so air cannot flow through your sinuses. This allows bacteria, viruses, and funguses to grow, which leads to infection. Sinusitis can develop quickly and last for 7?10 days (acute) or for more than 12 weeks (chronic). Sinusitis often develops after a cold. What are the causes? This condition is caused by anything that creates swelling in the sinuses or stops mucus from draining, including:  Allergies.  Asthma.  Bacterial or viral infection.  Abnormally shaped bones between the nasal passages.  Nasal growths that contain mucus (nasal polyps).  Narrow sinus openings.  Pollutants, such as chemicals or irritants in the air.  A foreign object stuck in the nose.  A fungal infection. This is rare.  What increases the risk? The following factors may make you more likely to develop this condition:  Having allergies or asthma.  Having had a recent cold or respiratory tract infection.  Having structural deformities or blockages in your nose or sinuses.  Having a weak immune system.  Doing a lot of swimming or diving.  Overusing nasal sprays.  Smoking.  What  are the signs or symptoms? The main symptoms of this condition are pain and a feeling of pressure around the affected sinuses. Other symptoms include:  Upper toothache.  Earache.  Headache.  Bad breath.  Decreased sense of smell and taste.  A cough that may get worse at night.  Fatigue.  Fever.  Thick drainage from your nose. The drainage is often green and it may contain pus (purulent).  Stuffy nose or congestion.  Postnasal drip. This is when extra mucus collects in the throat or back of the nose.  Swelling and warmth over the affected sinuses.  Sore throat.  Sensitivity to light.  How is this diagnosed? This condition is diagnosed based on symptoms, a medical history, and a physical exam. To find out if your condition is acute or chronic, your health care provider may:  Look in your nose for signs of nasal polyps.  Tap over the affected sinus to check for signs of infection.  View the inside of your sinuses using an imaging device that has a light attached (endoscope).  If your health care provider suspects that you have chronic sinusitis, you may also:  Be tested for allergies.  Have a sample of mucus taken from your nose (nasal culture) and checked for bacteria.  Have a mucus sample examined to see if your sinusitis is related to an allergy.  If your sinusitis does not respond to treatment and it lasts longer than 8 weeks, you may have an MRI or CT scan to check your sinuses. These scans also help to determine how severe your infection is. In rare cases, a bone biopsy may be done to rule out more serious types of fungal sinus disease. How is this treated? Treatment for sinusitis depends on the cause and whether your condition is chronic or acute. If a virus is causing your sinusitis, your symptoms will go away on their own within 10 days. You may be given medicines to relieve your symptoms, including:  Topical nasal decongestants. They shrink swollen nasal  passages and let mucus drain from your sinuses.  Antihistamines. These drugs block inflammation that is triggered by allergies. This can help to ease swelling in your nose and sinuses.  Topical nasal corticosteroids. These are nasal sprays that ease inflammation and swelling in your nose and sinuses.  Nasal saline washes. These rinses can help to get rid of thick mucus in your nose.  If your condition is caused by bacteria, you will be given an antibiotic medicine. If your condition is caused by a fungus, you will be given an antifungal medicine. Surgery may be needed to correct underlying conditions, such as narrow nasal passages. Surgery may also be needed to remove polyps. Follow these instructions at home: Medicines  Take, use, or apply over-the-counter and prescription medicines only as told by your health care provider. These may include nasal sprays.  If you were prescribed an antibiotic medicine, take it as told by your health care provider. Do not stop taking the antibiotic even if you start to feel better. Hydrate and Humidify  Drink enough water to keep your urine clear or pale yellow. Staying hydrated will help to thin your mucus.  Use a cool mist humidifier to keep the humidity level in your home above 50%.  Inhale steam for 10-15 minutes, 3-4 times a day or as told by your health care provider. You can do this in the bathroom while a hot shower is running.  Limit your exposure to cool or dry air. Rest  Rest as much as possible.  Sleep with your head raised (elevated).  Make sure to get enough sleep each night. General instructions  Apply a warm, moist washcloth to your face 3-4 times a day or as told by your health care provider. This will help with discomfort.  Wash your hands often with soap and water to reduce your exposure to viruses and other germs. If soap and water are not available, use hand sanitizer.  Do not smoke. Avoid being around people who are  smoking (secondhand smoke).  Keep all follow-up visits as told by your health care provider. This is important. Contact a health care provider if:  You have a fever.  Your symptoms get worse.  Your symptoms do not improve within 10 days. Get help right away if:  You have a severe headache.  You have persistent vomiting.  You have pain or swelling around your face or eyes.  You have vision problems.  You develop confusion.  Your neck is stiff.  You have trouble breathing. This information is not intended to replace advice given to you by your health care provider. Make sure you discuss any questions you  have with your health care provider. Document Released: 08/06/2005 Document Revised: 04/01/2016 Document Reviewed: 06/01/2015 Elsevier Interactive Patient Education  2018 [employer]  

## 2017-12-29 NOTE — Progress Notes (Signed)
Subjective:  Andrea Montes is a 62 y.o. female who presents for evaluation of possible sinusitis.  Symptoms include congestion, fever: suspected fevers but not measured at home, headache described as dull, itching in eyes, nasal congestion, post nasal drip, productive cough with yellow colored sputum, sinus pressure, sinus pain, sneezing, sore throat and tooth pain.  Onset of symptoms was 5 days ago, and has been rapidly worsening since that time.  Treatment to date:  Excedrin.  High risk factors for influenza complications:  none.  The following portions of the patient's history were reviewed and updated as appropriate:  allergies, current medications and past medical history.  Constitutional: positive for anorexia, chills, fatigue and fevers, negative for malaise, night sweats and sweats Eyes: positive for itchy eyes, negative for irritation and visual disturbance Ears, nose, mouth, throat, and face: positive for hoarseness, nasal congestion and sore throat, negative for ear drainage and earaches Respiratory: positive for chronic bronchitis, cough, sputum and wheezing, negative for asthma, pneumonia and stridor Cardiovascular: negative Gastrointestinal: negative except for decreased appetite Neurological: positive for headaches, negative for coordination problems, dizziness, paresthesia, seizures, vertigo and weakness Allergic/Immunologic: negative for hay fever Objective:  BP 128/78 (BP Location: Right Arm, Patient Position: Sitting, Cuff Size: Normal)   Pulse 93   Temp 98.3 F (36.8 C) (Oral)   Resp (!) 22   Wt 269 lb 9.6 oz (122.3 kg)   SpO2 98%   BMI 47.76 kg/m  General appearance: alert, cooperative, fatigued and no distress Head: Normocephalic, without obvious abnormality, atraumatic Eyes: conjunctivae/corneas clear. PERRL, EOM's intact. Fundi benign. Ears: normal TM's and external ear canals both ears Nose: no discharge, turbinates swollen, inflamed, severe maxillary sinus  tenderness right, severe frontal sinus tenderness right Throat: lips, mucosa, and tongue normal; teeth and gums normal Lungs: clear to auscultation bilaterally Heart: regular rate and rhythm, S1, S2 normal, no murmur, click, rub or gallop Abdomen: soft, non-tender; bowel sounds normal; no masses,  no organomegaly Pulses: 2+ and symmetric Skin: Skin color, texture, turgor normal. No rashes or lesions Lymph nodes: cervical and submandibular nodes normal Neurologic: Grossly normal    Assessment:  Acute Pansinusitis and Acute Bronchitis    Plan:  Discussed diagnosis and treatment of sinusitis. Educational material distributed and questions answered. Suggested symptomatic OTC remedies. Supportive care with appropriate antipyretics and fluids. Nasal saline spray for congestion. Augmentin, Flonase, Albuterol Inhaler, Hycodan Syrup and Tessalon Perles per orders. Follow up as needed. Meds ordered this encounter  Medications  . amoxicillin-clavulanate (AUGMENTIN) 875-125 MG tablet    Sig: Take 1 tablet by mouth 2 (two) times daily for 10 days.    Dispense:  20 tablet    Refill:  0    Order Specific Question:   Supervising Provider    Answer:   Ricard [name] [[id number]]  . fluticasone (FLONASE) 50 MCG/ACT nasal spray    Sig: Place 2 sprays into both nostrils daily for 10 days.    Dispense:  16 g    Refill:  0    Order Specific Question:   Supervising Provider    Answer:   Ricard [name] [[id number]]  . albuterol (PROVENTIL HFA;VENTOLIN HFA) 108 (90 Base) MCG/ACT inhaler    Sig: Inhale 2 puffs into the lungs every 6 (six) hours as needed for up to 10 days for wheezing or shortness of breath.    Dispense:  1 Inhaler    Refill:  0    Order Specific Question:   Supervising Provider  Answer:   Ricard [name] [[id number]]  . benzonatate (TESSALON) 200 MG capsule    Sig: Take 1 capsule (200 mg total) by mouth 3 (three) times daily as needed for up to 10 days for cough.    Dispense:  30 capsule    Refill:   0    Order Specific Question:   Supervising Provider    Answer:   Ricard [name] [[id number]]  . HYDROcodone-homatropine (HYCODAN) 5-1.5 MG/5ML syrup    Sig: Take 5 mLs by mouth every 6 (six) hours as needed for up to 7 days for cough.    Dispense:  150 mL    Refill:  0    Order Specific Question:   Supervising Provider    Answer:   Ricard [name] [phone]

## 2017-12-30 MED FILL — VENTOLIN HFA 90 MCG INHALER: 108 (90 BAS | 25 days supply | Qty: 18 | Fill #0

## 2018-01-06 DIAGNOSIS — Z1159 Encounter for screening for other viral diseases: Secondary | ICD-10-CM | POA: Diagnosis not present

## 2018-01-06 DIAGNOSIS — Z0000 Encounter for general adult medical examination without abnormal findings: Secondary | ICD-10-CM | POA: Diagnosis not present

## 2018-01-07 DIAGNOSIS — Z0000 Encounter for general adult medical examination without abnormal findings: Secondary | ICD-10-CM | POA: Diagnosis not present

## 2018-01-07 DIAGNOSIS — G4730 Sleep apnea, unspecified: Secondary | ICD-10-CM | POA: Diagnosis not present

## 2018-01-07 DIAGNOSIS — G43009 Migraine without aura, not intractable, without status migrainosus: Secondary | ICD-10-CM | POA: Diagnosis not present

## 2018-01-07 DIAGNOSIS — R600 Localized edema: Secondary | ICD-10-CM | POA: Diagnosis not present

## 2018-01-07 DIAGNOSIS — Z1159 Encounter for screening for other viral diseases: Secondary | ICD-10-CM | POA: Diagnosis not present

## 2018-01-07 DIAGNOSIS — R739 Hyperglycemia, unspecified: Secondary | ICD-10-CM | POA: Diagnosis not present

## 2018-01-07 MED FILL — MELOXICAM 15 MG TABLET: 15 | 30 days supply | Qty: 30 | Fill #0

## 2018-01-07 MED FILL — FUROSEMIDE 20 MG TABS: 20 | 30 days supply | Qty: 30 | Fill #0

## 2018-01-07 MED FILL — SUMATRIPTAN SUCC 25 MG TAB: 25 | 30 days supply | Qty: 15 | Fill #0

## 2018-01-07 MED FILL — METHOCARBAMOL 750 MG TABS: 750 | 10 days supply | Qty: 30 | Fill #0

## 2018-01-27 ENCOUNTER — Encounter: Payer: Self-pay | Admitting: Nurse Practitioner

## 2018-01-27 ENCOUNTER — Ambulatory Visit (INDEPENDENT_AMBULATORY_CARE_PROVIDER_SITE_OTHER): Payer: Self-pay | Admitting: Nurse Practitioner

## 2018-01-27 VITALS — BP 140/82 | HR 82 | Temp 98.9°F | Wt 269.0 lb

## 2018-01-27 DIAGNOSIS — B9789 Other viral agents as the cause of diseases classified elsewhere: Secondary | ICD-10-CM

## 2018-01-27 DIAGNOSIS — J019 Acute sinusitis, unspecified: Secondary | ICD-10-CM

## 2018-01-27 MED ORDER — MONTELUKAST SODIUM 10 MG PO TABS: 10.0000 mg | ORAL_TABLET | Freq: Every day | ORAL | 0 refills | Status: DC

## 2018-01-27 MED ORDER — BENZONATATE 200 MG PO CAPS: 200.0000 mg | ORAL_CAPSULE | Freq: Three times a day (TID) | ORAL | 0 refills | Status: AC | PRN

## 2018-01-27 MED ORDER — PSEUDOEPH-BROMPHEN-DM 30-2-10 MG/5ML PO SYRP: 5.0000 mL | ORAL_SOLUTION | Freq: Four times a day (QID) | ORAL | 0 refills | Status: AC | PRN

## 2018-01-27 NOTE — Patient Instructions (Signed)
Sinusitis, Adult Sinusitis is soreness and inflammation of your sinuses. Sinuses are hollow spaces in the bones around your face. Your sinuses are located:  Around your eyes.  In the middle of your forehead.  Behind your nose.  In your cheekbones.  Your sinuses and nasal passages are lined with a stringy fluid (mucus). Mucus normally drains out of your sinuses. When your nasal tissues become inflamed or swollen, the mucus can become trapped or blocked so air cannot flow through your sinuses. This allows bacteria, viruses, and funguses to grow, which leads to infection. Sinusitis can develop quickly and last for 7?10 days (acute) or for more than 12 weeks (chronic). Sinusitis often develops after a cold. What are the causes? This condition is caused by anything that creates swelling in the sinuses or stops mucus from draining, including:  Allergies.  Asthma.  Bacterial or viral infection.  Abnormally shaped bones between the nasal passages.  Nasal growths that contain mucus (nasal polyps).  Narrow sinus openings.  Pollutants, such as chemicals or irritants in the air.  A foreign object stuck in the nose.  A fungal infection. This is rare.  What increases the risk? The following factors may make you more likely to develop this condition:  Having allergies or asthma.  Having had a recent cold or respiratory tract infection.  Having structural deformities or blockages in your nose or sinuses.  Having a weak immune system.  Doing a lot of swimming or diving.  Overusing nasal sprays.  Smoking.  What are the signs or symptoms? The main symptoms of this condition are pain and a feeling of pressure around the affected sinuses. Other symptoms include:  Upper toothache.  Earache.  Headache.  Bad breath.  Decreased sense of smell and taste.  A cough that may get worse at night.  Fatigue.  Fever.  Thick drainage from your nose. The drainage is often green and  it may contain pus (purulent).  Stuffy nose or congestion.  Postnasal drip. This is when extra mucus collects in the throat or back of the nose.  Swelling and warmth over the affected sinuses.  Sore throat.  Sensitivity to light.  How is this diagnosed? This condition is diagnosed based on symptoms, a medical history, and a physical exam. To find out if your condition is acute or chronic, your health care provider may:  Look in your nose for signs of nasal polyps.  Tap over the affected sinus to check for signs of infection.  View the inside of your sinuses using an imaging device that has a light attached (endoscope).  If your health care provider suspects that you have chronic sinusitis, you may also:  Be tested for allergies.  Have a sample of mucus taken from your nose (nasal culture) and checked for bacteria.  Have a mucus sample examined to see if your sinusitis is related to an allergy.  If your sinusitis does not respond to treatment and it lasts longer than 8 weeks, you may have an MRI or CT scan to check your sinuses. These scans also help to determine how severe your infection is. In rare cases, a bone biopsy may be done to rule out more serious types of fungal sinus disease. How is this treated? Treatment for sinusitis depends on the cause and whether your condition is chronic or acute. If a virus is causing your sinusitis, your symptoms will go away on their own within 10 days. You may be given medicines to relieve your symptoms,   including:  Topical nasal decongestants. They shrink swollen nasal passages and let mucus drain from your sinuses.  Antihistamines. These drugs block inflammation that is triggered by allergies. This can help to ease swelling in your nose and sinuses.  Topical nasal corticosteroids. These are nasal sprays that ease inflammation and swelling in your nose and sinuses.  Nasal saline washes. These rinses can help to get rid of thick mucus in  your nose.  If your condition is caused by bacteria, you will be given an antibiotic medicine. If your condition is caused by a fungus, you will be given an antifungal medicine. Surgery may be needed to correct underlying conditions, such as narrow nasal passages. Surgery may also be needed to remove polyps. Follow these instructions at home: Medicines  Take, use, or apply over-the-counter and prescription medicines only as told by your health care provider. These may include nasal sprays.  If you were prescribed an antibiotic medicine, take it as told by your health care provider. Do not stop taking the antibiotic even if you start to feel better. Hydrate and Humidify  Drink enough water to keep your urine clear or pale yellow. Staying hydrated will help to thin your mucus.  Use a cool mist humidifier to keep the humidity level in your home above 50%.  Inhale steam for 10-15 minutes, 3-4 times a day or as told by your health care provider. You can do this in the bathroom while a hot shower is running.  Limit your exposure to cool or dry air. Rest  Rest as much as possible.  Sleep with your head raised (elevated).  Make sure to get enough sleep each night. General instructions  Apply a warm, moist washcloth to your face 3-4 times a day or as told by your health care provider. This will help with discomfort.  Wash your hands often with soap and water to reduce your exposure to viruses and other germs. If soap and water are not available, use hand sanitizer.  Do not smoke. Avoid being around people who are smoking (secondhand smoke).  Keep all follow-up visits as told by your health care provider. This is important. Contact a health care provider if:  You have a fever.  Your symptoms get worse.  Your symptoms do not improve within 10 days. Get help right away if:  You have a severe headache.  You have persistent vomiting.  You have pain or swelling around your face or  eyes.  You have vision problems.  You develop confusion.  Your neck is stiff.  You have trouble breathing. This information is not intended to replace advice given to you by your health care provider. Make sure you discuss any questions you have with your health care provider. Document Released: 08/06/2005 Document Revised: 04/01/2016 Document Reviewed: 06/01/2015 Elsevier Interactive Patient Education  2018 [employer] Cough, Adult Coughing is a reflex that clears your throat and your airways. Coughing helps to heal and protect your lungs. It is normal to cough occasionally, but a cough that happens with other symptoms or lasts a long time may be a sign of a condition that needs treatment. A cough may last only 2-3 weeks (acute), or it may last longer than 8 weeks (chronic). What are the causes? Coughing is commonly caused by:  Breathing in substances that irritate your lungs.  A viral or bacterial respiratory infection.  Allergies.  Asthma.  Postnasal drip.  Smoking.  Acid backing up from the stomach into the esophagus (gastroesophageal reflux).    Certain medicines.  Chronic lung problems, including COPD (or rarely, lung cancer).  Other medical conditions such as heart failure.  Follow these instructions at home: Pay attention to any changes in your symptoms. Take these actions to help with your discomfort:  Take medicines only as told by your health care provider. ? If you were prescribed an antibiotic medicine, take it as told by your health care provider. Do not stop taking the antibiotic even if you start to feel better. ? Talk with your health care provider before you take a cough suppressant medicine.  Drink enough fluid to keep your urine clear or pale yellow.  If the air is dry, use a cold steam vaporizer or humidifier in your bedroom or your home to help loosen secretions.  Avoid anything that causes you to cough at work or at home.  If your cough is  worse at night, try sleeping in a semi-upright position.  Avoid cigarette smoke. If you smoke, quit smoking. If you need help quitting, ask your health care provider.  Avoid caffeine.  Avoid alcohol.  Rest as needed.  Contact a health care provider if:  You have new symptoms.  You cough up pus.  Your cough does not get better after 2-3 weeks, or your cough gets worse.  You cannot control your cough with suppressant medicines and you are losing sleep.  You develop pain that is getting worse or pain that is not controlled with pain medicines.  You have a fever.  You have unexplained weight loss.  You have night sweats. Get help right away if:  You cough up blood.  You have difficulty breathing.  Your heartbeat is very fast. This information is not intended to replace advice given to you by your health care provider. Make sure you discuss any questions you have with your health care provider. Document Released: 02/02/2011 Document Revised: 01/12/2016 Document Reviewed: 10/13/2014 Elsevier Interactive Patient Education  2018 [employer]  

## 2018-01-27 NOTE — Progress Notes (Signed)
Subjective:  Andrea Montes is a 62 y.o. female who presents for evaluation of possible sinusitis.  Symptoms include bilateral ear pressure/pain, headache described as dull, aching, nasal congestion, post nasal drip, productive cough with yellow colored sputum, sinus pressure and sore throat.  Onset of symptoms was 3 days ago, and has been gradually worsening since that time.  Treatment to date:  decongestants, ibuprofen and nasal steroids.  High risk factors for influenza complications:  none.  Patient was recently treated for an acute pansinusitis last month.  Patient states her symptoms improved.  Patient states symptoms started about 3 days ago after getting wet after being caught in the rain.  The following portions of the patient's history were reviewed and updated as appropriate:  allergies, current medications and past medical history.  Constitutional: positive for chills, fatigue and sweats, negative for night sweats and weight loss Eyes: negative Ears, nose, mouth, throat, and face: positive for nasal congestion, sore throat and bilateral ear pain/pressure, negative for ear drainage, earaches and hoarseness Respiratory: positive for cough, sputum and wheezing, negative for asthma, chronic bronchitis, hemoptysis and pleurisy/chest pain Cardiovascular: negative Gastrointestinal: positive for decreased appetite, negative for abdominal pain, diarrhea, nausea and vomiting Neurological: positive for headaches, negative for coordination problems, dizziness, paresthesia, speech problems, tremors, vertigo and weakness Allergic/Immunologic: positive for hay fever Objective:  BP 140/82   Pulse 82   Temp 98.9 F (37.2 C)   Wt 269 lb (122 kg)   SpO2 95%   BMI 47.65 kg/m  Physical Exam  Constitutional: She is oriented to person, place, and time. She appears well-developed and well-nourished. She appears distressed (mild distress due to coughing).  HENT:  Head: Normocephalic and atraumatic.   Right Ear: External ear normal.  Left Ear: External ear normal.  Nose: Nose normal.  Mouth/Throat: Oropharynx is clear and moist. No oropharyngeal exudate.  Mild oropharyngeal edema.  Clear discharge, moderate congestion, turbinates swollen and inflamed.  Mild frontal and maxillary sinus tenderness.  Eyes: Pupils are equal, round, and reactive to light. EOM are normal.  Neck: Normal range of motion. Neck supple. No tracheal deviation present. No thyromegaly present.  Cardiovascular: Normal rate, regular rhythm and normal heart sounds.  Pulmonary/Chest: Effort normal and breath sounds normal. She has no wheezes. She has no rales.  Abdominal: Soft. Bowel sounds are normal. She exhibits no distension. There is no tenderness.  Neurological: She is alert and oriented to person, place, and time.  Skin: Skin is warm and dry. Capillary refill takes 2 to 3 seconds.  Psychiatric: She has a normal mood and affect.     Assessment:  Viral Sinusitis    Plan:  Discussed diagnosis and treatment of sinusitis. Discussed the importance of avoiding unnecessary antibiotic therapy. Educational material distributed and questions answered. Suggested symptomatic OTC remedies. Suggested brief course of Afrin or similar. Supportive care with appropriate antipyretics and fluids. Nasal saline spray for congestion. Singulair, Bromfed and Tessalon Perles per orders. Follow up as needed.  She is to use humidifier at home, sleep elevated on 2 pillows, and increase fluids.  Patient also instructed to take ibuprofen 800 mg every 8 hours for the next 3 days to help with sore throat pain, and cough.  Discussed with patient at length the need to hold off on antibiotics in an effort to prevent over prescribing.  Instructed patient that we will attempt symptomatic treatment.  Instructed patient to call the office if her symptoms do not improve in the next 3 to 5 days.  At that time it may be warranted that she will need an  antibiotic.  Patient verbalizes understanding and has no questions at time of discharge.

## 2018-02-05 DIAGNOSIS — Z1231 Encounter for screening mammogram for malignant neoplasm of breast: Secondary | ICD-10-CM | POA: Diagnosis not present

## 2018-02-05 DIAGNOSIS — Z01419 Encounter for gynecological examination (general) (routine) without abnormal findings: Secondary | ICD-10-CM | POA: Diagnosis not present

## 2018-02-05 DIAGNOSIS — Z6841 Body Mass Index (BMI) 40.0 and over, adult: Secondary | ICD-10-CM | POA: Diagnosis not present

## 2018-03-06 ENCOUNTER — Ambulatory Visit (INDEPENDENT_AMBULATORY_CARE_PROVIDER_SITE_OTHER): Payer: 59 | Admitting: Podiatry

## 2018-03-06 ENCOUNTER — Ambulatory Visit (INDEPENDENT_AMBULATORY_CARE_PROVIDER_SITE_OTHER): Payer: 59

## 2018-03-06 DIAGNOSIS — M722 Plantar fascial fibromatosis: Secondary | ICD-10-CM

## 2018-03-06 DIAGNOSIS — B353 Tinea pedis: Secondary | ICD-10-CM | POA: Diagnosis not present

## 2018-03-06 MED ORDER — KETOCONAZOLE 2 % EX CREA: TOPICAL_CREAM | CUTANEOUS | 0 refills | Status: DC

## 2018-03-06 MED FILL — KETOCONAZOLE 2% CREAM: 2 | 30 days supply | Qty: 30 | Fill #0

## 2018-03-10 NOTE — Progress Notes (Signed)
  Subjective:  Patient ID: Andrea Montes, female    DOB: 03-06-1956,  MRN: [id number]  Chief Complaint  Patient presents with  . Foot Pain    B/L bottom heel pain x few months; 5/10 sharp pain -no injury Tx: tylenol -worst at night   62 y.o. female returns for the above complaint.  Reports pain to the bottom of both heels for couple months.  5 out of 10 sharp pain denies injury.  Has tried.  Worst at the end of the day.  Objective:  There were no vitals filed for this visit. General AA&O x3. Normal mood and affect.  Vascular Pedal pulses palpable.  Neurologic Epicritic sensation grossly intact.  Dermatologic No open lesions. Skin normal texture and turgor.  Xerosis with scaling to the plantar foot bilateral  Orthopedic:  Pain to palpation about the medial calcaneal tuber bilat   Assessment & Plan:  Patient was evaluated and treated and all questions answered.  Plantar Fasciitis, bilaterally - XR reviewed as above.  - Educated on icing and stretching. Instructions given.  - Injection delivered to the plantar fascia as below. Procedure: Injection Tendon/Ligament  Location: Bilateral plantar fascia at the glabrous junction; medial approach. Skin Prep: Alcohol. Injectate: 1 cc 0.5% marcaine plain, 1 cc dexamethasone phosphate, 0.5 cc kenalog 10. Disposition: Patient tolerated procedure well. Injection site dressed with a band-aid.  Tinea Pedis -Rx Ketoconazole  Return in about 3 weeks (around 03/27/2018) for Plantar fasciitis, tinea pedis.

## 2018-03-20 ENCOUNTER — Encounter: Payer: Self-pay | Admitting: Podiatry

## 2018-03-20 ENCOUNTER — Ambulatory Visit (INDEPENDENT_AMBULATORY_CARE_PROVIDER_SITE_OTHER): Payer: 59 | Admitting: Podiatry

## 2018-03-20 ENCOUNTER — Ambulatory Visit: Payer: 59 | Admitting: Orthotics

## 2018-03-20 DIAGNOSIS — M722 Plantar fascial fibromatosis: Secondary | ICD-10-CM

## 2018-03-20 DIAGNOSIS — M545 Low back pain: Secondary | ICD-10-CM

## 2018-03-20 DIAGNOSIS — M79605 Pain in left leg: Secondary | ICD-10-CM

## 2018-03-20 DIAGNOSIS — M2170 Unequal limb length (acquired), unspecified site: Secondary | ICD-10-CM

## 2018-03-20 NOTE — Patient Instructions (Signed)

## 2018-03-20 NOTE — Progress Notes (Signed)
Patient came in today to pick up custom made foot orthotics.  The goals were accomplished and the patient reported no dissatisfaction with said orthotics.  Patient was advised of breakin period and how to report any issues. 

## 2018-03-20 NOTE — Progress Notes (Signed)
Patient presents today for follow up of her plantar fasciitis. She states that her feet were actually feeling much better. Rick dispensed orthotics and fit was satisfactory and instructions for wear were reviewed. Patient will follow up in 6 weeks if she is no better.

## 2018-04-23 ENCOUNTER — Encounter (INDEPENDENT_AMBULATORY_CARE_PROVIDER_SITE_OTHER): Payer: 59

## 2018-05-07 ENCOUNTER — Encounter (INDEPENDENT_AMBULATORY_CARE_PROVIDER_SITE_OTHER): Payer: Self-pay | Admitting: Family Medicine

## 2018-05-07 ENCOUNTER — Ambulatory Visit (INDEPENDENT_AMBULATORY_CARE_PROVIDER_SITE_OTHER): Payer: 59 | Admitting: Family Medicine

## 2018-05-07 VITALS — HR 55 | Temp 97.3°F | Ht 63.0 in | Wt 270.0 lb

## 2018-05-07 DIAGNOSIS — R5383 Other fatigue: Secondary | ICD-10-CM | POA: Diagnosis not present

## 2018-05-07 DIAGNOSIS — Z0289 Encounter for other administrative examinations: Secondary | ICD-10-CM

## 2018-05-07 DIAGNOSIS — Z1331 Encounter for screening for depression: Secondary | ICD-10-CM | POA: Diagnosis not present

## 2018-05-07 DIAGNOSIS — R600 Localized edema: Secondary | ICD-10-CM | POA: Diagnosis not present

## 2018-05-07 DIAGNOSIS — Z6841 Body Mass Index (BMI) 40.0 and over, adult: Secondary | ICD-10-CM | POA: Diagnosis not present

## 2018-05-07 DIAGNOSIS — R7303 Prediabetes: Secondary | ICD-10-CM | POA: Diagnosis not present

## 2018-05-07 DIAGNOSIS — Z9189 Other specified personal risk factors, not elsewhere classified: Secondary | ICD-10-CM | POA: Diagnosis not present

## 2018-05-07 DIAGNOSIS — R0602 Shortness of breath: Secondary | ICD-10-CM

## 2018-05-08 LAB — CBC WITH DIFFERENTIAL
BASOS: 1 %
Basophils Absolute: 0 10*3/uL (ref 0.0–0.2)
EOS (ABSOLUTE): 0.1 10*3/uL (ref 0.0–0.4)
Eos: 2 %
Hematocrit: 36.9 % (ref 34.0–46.6)
Hemoglobin: 11.8 g/dL (ref 11.1–15.9)
IMMATURE GRANULOCYTES: 0 %
Immature Grans (Abs): 0 10*3/uL (ref 0.0–0.1)
LYMPHS: 45 %
Lymphocytes Absolute: 2.9 10*3/uL (ref 0.7–3.1)
MCH: 22.1 pg — ABNORMAL LOW (ref 26.6–33.0)
MCHC: 32 g/dL (ref 31.5–35.7)
MCV: 69 fL — ABNORMAL LOW (ref 79–97)
Monocytes Absolute: 0.4 10*3/uL (ref 0.1–0.9)
Monocytes: 6 %
NEUTROS PCT: 46 %
Neutrophils Absolute: 3.1 10*3/uL (ref 1.4–7.0)
RBC: 5.34 x10E6/uL — ABNORMAL HIGH (ref 3.77–5.28)
RDW: 16.4 % — AB (ref 12.3–15.4)
WBC: 6.5 10*3/uL (ref 3.4–10.8)

## 2018-05-08 LAB — COMPREHENSIVE METABOLIC PANEL
ALK PHOS: 93 IU/L (ref 39–117)
ALT: 6 IU/L (ref 0–32)
AST: 17 IU/L (ref 0–40)
Albumin/Globulin Ratio: 1.2 (ref 1.2–2.2)
Albumin: 3.7 g/dL (ref 3.6–4.8)
BUN/Creatinine Ratio: 12 (ref 12–28)
BUN: 10 mg/dL (ref 8–27)
Bilirubin Total: 0.2 mg/dL (ref 0.0–1.2)
CO2: 25 mmol/L (ref 20–29)
CREATININE: 0.82 mg/dL (ref 0.57–1.00)
Calcium: 8.7 mg/dL (ref 8.7–10.3)
Chloride: 100 mmol/L (ref 96–106)
GFR calc Af Amer: 90 mL/min/{1.73_m2} (ref >59)
GFR calc non Af Amer: 78 mL/min/{1.73_m2} (ref >59)
GLOBULIN, TOTAL: 3.2 g/dL (ref 1.5–4.5)
GLUCOSE: 99 mg/dL (ref 65–99)
Potassium: 3.9 mmol/L (ref 3.5–5.2)
SODIUM: 139 mmol/L (ref 134–144)
Total Protein: 6.9 g/dL (ref 6.0–8.5)

## 2018-05-08 LAB — HEMOGLOBIN A1C
ESTIMATED AVERAGE GLUCOSE: 146 mg/dL
HEMOGLOBIN A1C: 6.7 % — AB (ref 4.8–5.6)

## 2018-05-08 LAB — LIPID PANEL WITH LDL/HDL RATIO
Cholesterol, Total: 130 mg/dL (ref 100–199)
HDL: 35 mg/dL — ABNORMAL LOW (ref >39)
LDL Calculated: 77 mg/dL (ref 0–99)
LDL/HDL RATIO: 2.2 ratio (ref 0.0–3.2)
Triglycerides: 89 mg/dL (ref 0–149)
VLDL Cholesterol Cal: 18 mg/dL (ref 5–40)

## 2018-05-08 LAB — T3: T3, Total: 125 ng/dL (ref 71–180)

## 2018-05-08 LAB — INSULIN, RANDOM: INSULIN: 39.8 u[IU]/mL — AB (ref 2.6–24.9)

## 2018-05-08 LAB — VITAMIN D 25 HYDROXY (VIT D DEFICIENCY, FRACTURES): VIT D 25 HYDROXY: 14.4 ng/mL — AB (ref 30.0–100.0)

## 2018-05-08 LAB — TSH: TSH: 1.47 u[IU]/mL (ref 0.450–4.500)

## 2018-05-08 LAB — T4, FREE: FREE T4: 0.98 ng/dL (ref 0.82–1.77)

## 2018-05-08 LAB — VITAMIN B12: Vitamin B-12: 650 pg/mL (ref 232–1245)

## 2018-05-08 LAB — FOLATE: FOLATE: 7.3 ng/mL (ref >3.0)

## 2018-05-08 NOTE — Progress Notes (Signed)
Office: [phone]  /  Fax: [phone]   Dear Dr. Garwin Montes,   Thank you for referring Andrea Montes to our clinic. The following note includes my evaluation and treatment recommendations.  HPI:   Chief Complaint: OBESITY    Andrea Montes has been referred by Andrea A. Andrea Brothers, MD for consultation regarding her obesity and obesity related comorbidities.    Andrea Montes (MR# [id number]) is a 62 y.o. female who presents on 05/08/2018 for obesity evaluation and treatment. Current BMI is Body mass index is 47.83 kg/m.[name] Andrea Montes has been struggling with her weight for many years and has been unsuccessful in either losing weight, maintaining weight loss, or reaching her healthy weight goal. Andrea Montes is lactose intolerant. Andrea Montes can do yogurt, but not milk and possibly cheese.     Andrea Montes attended our information session and states she is currently in the action stage of change and ready to dedicate time achieving and maintaining a healthier weight. Andrea Montes is interested in becoming our patient and working on intensive lifestyle modifications including (but not limited to) diet, exercise and weight loss.    Andrea Montes states her family eats meals together she thinks her family will eat healthier with  her her desired weight loss is 73 lbs she has been heavy most of  her life her heaviest weight ever was 273 lbs. she has significant food cravings issues  she snacks frequently in the evenings she skips meals frequently she is frequently drinking liquids with calories she struggles with emotional eating    Fatigue Andrea Montes feels her energy is lower than it should be. This has worsened with weight gain and has not worsened recently. Andrea Montes admits to daytime somnolence and  admits to waking up still tired. Patient is at risk for obstructive sleep apnea. Patent has a history of symptoms of daytime fatigue, morning fatigue and morning headache. Patient generally gets 6 to 8 hours of sleep per  night, and states they generally have restful sleep. Snoring is present. Apneic episodes are not present. Epworth Sleepiness Score is 8  EKG was ordered today and shows sinus bradycardia.  Dyspnea on exertion Andrea Montes notes increasing shortness of breath with exercising and seems to be worsening over time with weight gain. She notes getting out of breath sooner with activity than she used to. This has not gotten worse recently. EKG was ordered today and shows sinus bradycardia. Andrea Montes denies orthopnea.  Lower Extremity Edema Andrea Montes has a diagnosis of lower extremity edema and she is on lasix daily.  Pre-Diabetes Andrea Montes has a historical diagnosis of prediabetes and she was informed this puts her at greater risk of developing diabetes. There is no Hgb A1c lab result in Epic. She is not taking metformin currently and she is attempting to work on diet and exercise to decrease risk of diabetes. She denies nausea or hypoglycemia.  At risk for diabetes Andrea Montes is at higher than average risk for developing diabetes due to her obesity. She currently denies polyuria or polydipsia.  Depression Screen Andrea Montes's Food and Mood (modified PHQ-9) score was  Depression screen PHQ 2/9 05/07/2018  Decreased Interest 1  Down, Depressed, Hopeless 1  PHQ - 2 Score 2  Altered sleeping 2  Tired, decreased energy 1  Change in appetite 1  Feeling bad or failure about yourself  1  Trouble concentrating 2  Moving slowly or fidgety/restless 0  Suicidal thoughts 0  PHQ-9 Score 9  Difficult doing work/chores Not difficult at all    ALLERGIES: No  Known Allergies  MEDICATIONS: Current Outpatient Medications on File Prior to Visit  Medication Sig Dispense Refill  . bacitracin 500 UNIT/GM ointment Apply 1 application topically 2 (two) times daily. 15 g 0  . furosemide (LASIX) 20 MG tablet Take 1 tablet (20 mg total) by mouth daily. (Patient taking differently: Take 20 mg by mouth daily as needed for fluid. ) 30 tablet  11  . meloxicam (MOBIC) 15 MG tablet Take 1 tablet (15 mg total) by mouth daily as needed for pain. 60 tablet 1  . montelukast (SINGULAIR) 10 MG tablet Take 1 tablet (10 mg total) by mouth at bedtime. 30 tablet 0  . SUMAtriptan (IMITREX) 25 MG tablet Take 25 mg by mouth every 2 (two) hours as needed for migraine or headache.   1  . traMADol (ULTRAM) 50 MG tablet Take 1 tablet (50 mg total) by mouth every 6 (six) hours as needed. (Patient taking differently: Take 50 mg by mouth every 6 (six) hours as needed (for pain.). ) 60 tablet 2  . albuterol (PROVENTIL HFA;VENTOLIN HFA) 108 (90 Base) MCG/ACT inhaler Inhale 2 puffs into the lungs every 6 (six) hours as needed for up to 10 days for wheezing or shortness of breath. 1 Inhaler 0  . fluticasone (FLONASE) 50 MCG/ACT nasal spray Place 2 sprays into both nostrils daily for 10 days. 16 g 0  . phenazopyridine (PYRIDIUM) 100 MG tablet Take 1 tablet (100 mg total) by mouth 3 (three) times daily as needed for pain. 10 tablet 0  . PROAIR HFA 108 (90 Base) MCG/ACT inhaler INHALE TWO PUFFS BY MOUTH EVERY 4 TO 6 HOURS AS NEEDED FOR SHORTNESS OF BREATH  0   Current Facility-Administered Medications on File Prior to Visit  Medication Dose Route Frequency Provider Last Rate Last Dose  . 0.9 %  sodium chloride infusion  500 mL Intravenous Continuous Andrea Montes, Andrea Minks, MD        PAST MEDICAL HISTORY: Past Medical History:  Diagnosis Date  . Anemia    no current med.  [name] Apnea   . 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 Back pain   . Carpal tunnel syndrome   . Complication of anesthesia    itching from last surgery that was completed at the surgery center off of [address] patient was given benedryl, generalized itching and they are not sure where that came from  . DDD (degenerative disc disease), lumbar   . Dental crowns present   . Edema of lower extremity    bilateral  . Frozen shoulder    surgery 11-04-14  . GERD  (gastroesophageal reflux disease)    no current med.  [name] GERD (gastroesophageal reflux disease)   . History of blood transfusion   . History of shingles   . Hypertension    borderline not on medicaiton at this time  . Impingement syndrome of left shoulder region 10/2014  . Joint pain   . Migraines   . Obesity   . Rheumatoid arteritis   . Sleep apnea    uses CPAP nightly  . Trigger finger, left middle finger   . Vitamin D deficiency   . Wears partial dentures    upper    PAST SURGICAL HISTORY: Past Surgical History:  Procedure Laterality Date  . CARPAL TUNNEL RELEASE Left 04/25/2006  . CLOSED MANIPULATION SHOULDER WITH STERIOD INJECTION Left 02/03/2015   Procedure: LEFT  MANIPULATION SHOULDER UNDER ANESTHESIA WITH INJECTION OF STEROID;  Surgeon: Kathryne Hitch, MD;  Location: [place];  Service: Orthopedics;  Laterality: Left;  . COLONOSCOPY    . COLONOSCOPY WITH PROPOFOL  10/09/2011  . POLYPECTOMY    . RESECTION DISTAL CLAVICAL Left 11/04/2014   Procedure: RESECTION DISTAL CLAVICAL;  Surgeon: Kathryne Hitch, MD;  Location: [place];  Service: Orthopedics;  Laterality: Left;  . ROTATOR CUFF REPAIR  2010  . SHOULDER ARTHROSCOPY WITH ROTATOR CUFF REPAIR Right 10/04/2008  . SHOULDER ARTHROSCOPY WITH SUBACROMIAL DECOMPRESSION, ROTATOR CUFF REPAIR AND BICEP TENDON REPAIR Left 11/04/2014   Procedure: LEFT SHOULDER ARTHROSCOPY WITH DEBRIDEMENT, DISTAL CLAVICLE EXCISION, ACROMIOPLASTY, ROTATOR CUFF REPAIR AND BICEP TENODESIS;  Surgeon: Kathryne Hitch, MD;  Location: [place];  Service: Orthopedics;  Laterality: Left;  . TRIGGER FINGER RELEASE Right 02/03/2015   Procedure: RIGHT LONG FINGER TRIGGER RELEASE;  Surgeon: Kathryne Hitch, MD;  Location: [place];  Service: Orthopedics;  Laterality: Right;  . TRIGGER FINGER RELEASE Left 10/10/2017   Procedure: LEFT HAND RELEASE TRIGGER FINGER 3RD LONG FINGER AND CYST EXCISION;  Surgeon: Meredith Pel, MD;  Location: [place];  Service: Orthopedics;  Laterality: Left;  [name] VAGINAL HYSTERECTOMY  2001   partial    SOCIAL HISTORY: Social History   Tobacco Use  . Smoking status: Never Smoker  . Smokeless tobacco: Never Used  Substance Use Topics  . Alcohol use: No  . Drug use: No    FAMILY HISTORY: Family History  Problem Relation Age of Onset  . Kidney disease Brother   . Hypertension Brother   . Leukemia Mother   . Heart disease Mother   . Stroke Mother   . Cancer Mother   . Depression Mother   . Anxiety disorder Mother   . AAA (abdominal aortic aneurysm) Father   . Esophageal cancer Maternal Uncle   . Hypertension Brother   . Colon cancer Neg Hx   . Colon polyps Neg Hx   . Rectal cancer Neg Hx   . Stomach cancer Neg Hx     ROS: Review of Systems  Constitutional: Positive for malaise/fatigue.  HENT:       + Dentures   Eyes:       + Wear Glasses or Contacts  Cardiovascular: Negative for orthopnea.       + Leg Cramping   Gastrointestinal: Positive for constipation and heartburn. Negative for nausea.  Genitourinary: Negative for frequency.  Musculoskeletal: Positive for back pain and neck pain.       + Neck Stiffness + Swollen Glands (neck) +Muscle or Joint Pain + Muscle Stiffness  Skin:       + Dryness   Neurological: Positive for headaches.  Endo/Heme/Allergies: Negative for polydipsia.       Negative for hypoglycemia  Psychiatric/Behavioral:       + Stress    PHYSICAL EXAM: Pulse (!) 55, temperature (!) 97.3 F (36.3 C), temperature source Oral, height 5\' 3"  (1.6 m), weight 270 lb (122.5 kg), SpO2 95 %. Body mass index is 47.83 kg/m. Physical Exam  Constitutional: She is oriented to person, place, and time. She appears well-developed and well-nourished.  HENT:  Head: Normocephalic and atraumatic.  Nose: Nose normal.  Eyes: EOM are normal. No scleral icterus.  Neck: Normal range of motion. Neck supple. No thyromegaly present.    Cardiovascular: Regular rhythm. Bradycardia present.  Pulmonary/Chest: Effort normal. No respiratory distress.  Abdominal: Soft. There is no tenderness.  + Obesity  Musculoskeletal: Normal range of motion. She exhibits edema (lower leg).  Range of Motion normal in all 4 extremities  Neurological: She is alert and oriented to person, place, and time. Coordination normal.  Skin: Skin is warm and dry.  Psychiatric: She has a normal mood and affect. Her behavior is normal.  Vitals reviewed.   RECENT LABS AND TESTS: BMET    Component Value Date/Time   NA 139 05/07/2018 1424   K 3.9 05/07/2018 1424   CL 100 05/07/2018 1424   CO2 25 05/07/2018 1424   GLUCOSE 99 05/07/2018 1424   GLUCOSE 136 (H) 10/08/2017 0841   BUN 10 05/07/2018 1424   CREATININE 0.82 05/07/2018 1424   CREATININE 0.85 03/07/2016 1654   CALCIUM 8.7 05/07/2018 1424   GFRNONAA 78 05/07/2018 1424   GFRAA 90 05/07/2018 1424   Lab Results  Component Value Date   HGBA1C 6.7 (H) 05/07/2018   Lab Results  Component Value Date   INSULIN 39.8 (H) 05/07/2018   CBC    Component Value Date/Time   WBC 6.5 05/07/2018 1424   WBC 16.9 (H) 10/08/2017 0841   RBC 5.34 (H) 05/07/2018 1424   RBC 5.75 (H) 10/08/2017 0841   HGB 11.8 05/07/2018 1424   HCT 36.9 05/07/2018 1424   PLT 233 10/08/2017 0841   MCV 69 (L) 05/07/2018 1424   MCH 22.1 (L) 05/07/2018 1424   MCH 22.1 (L) 10/08/2017 0841   MCHC 32.0 05/07/2018 1424   MCHC 31.4 10/08/2017 0841   RDW 16.4 (H) 05/07/2018 1424   LYMPHSABS 2.9 05/07/2018 1424   MONOABS 0.5 07/14/2017 2115   EOSABS 0.1 05/07/2018 1424   BASOSABS 0.0 05/07/2018 1424   Iron/TIBC/Ferritin/ %Sat No results found for: IRON, TIBC, FERRITIN, IRONPCTSAT Lipid Panel     Component Value Date/Time   CHOL 130 05/07/2018 1424   TRIG 89 05/07/2018 1424   HDL 35 (L) 05/07/2018 1424   CHOLHDL 4.1 Ratio 09/26/2009 1823   VLDL 26 09/26/2009 1823   LDLCALC 77 05/07/2018 1424   Hepatic Function  Panel     Component Value Date/Time   PROT 6.9 05/07/2018 1424   ALBUMIN 3.7 05/07/2018 1424   AST 17 05/07/2018 1424   ALT 6 05/07/2018 1424   ALKPHOS 93 05/07/2018 1424   BILITOT 0.2 05/07/2018 1424      Component Value Date/Time   TSH 1.470 05/07/2018 1424   TSH 1.55 11/25/2008 1700   Vitamin D Results for DOMONIQUE, COTHRAN (MRN [id number]) as of 05/08/2018 14:38  Ref. Range 07/07/2009 20:23  Vitamin D, 25-Hydroxy Latest Ref Range: 30 - 89 ng/mL 12 (L)    ECG  shows NSR with a rate of 50 BPM INDIRECT CALORIMETER done today shows a VO2 of 285 and a REE of 1984.  Her calculated basal metabolic rate is 27741 thus her basal metabolic rate is one point less than expected.    ASSESSMENT AND PLAN: Other fatigue - Plan: EKG 12-Lead, CBC With Differential, Comprehensive metabolic panel, Lipid Panel With LDL/HDL Ratio, VITAMIN D 25 Hydroxy (Vit-D Deficiency, Fractures), Vitamin B12, Folate, T4, free, TSH, T3  Shortness of breath on exertion  Lower leg edema - Plan: Comprehensive metabolic panel  Prediabetes - Plan: Hemoglobin A1c, Insulin, random, Lipid Panel With LDL/HDL Ratio  Depression screening  At risk for diabetes mellitus  Class 3 severe obesity with serious comorbidity and body mass index (BMI) of 45.0 to 49.9 in adult, unspecified obesity type (HCC)  PLAN: Fatigue Andrea Montes was informed that her fatigue may be related to obesity, depression or many other causes.  Labs will be ordered, and in the meanwhile Andrea Montes has agreed to work on diet, exercise and weight loss to help with fatigue. Proper sleep hygiene was discussed including the need for 7-8 hours of quality sleep each night. A sleep study was not ordered based on symptoms and Epworth score. We will order Indirect Calorimetry and EKG today.  Dyspnea on exertion Andrea Montes's shortness of breath appears to be obesity related and exercise induced. She has agreed to work on weight loss and gradually increase exercise to  treat her exercise induced shortness of breath. If Letina follows our instructions and loses weight without improvement of her shortness of breath, we will plan to refer to pulmonology. We will order Indirect Calorimetry, EKG and labs today. We will monitor this condition regularly. Shondell agrees to this plan.  Lower Extremity Edema We will check CMP today and Andrea Montes will continue Lasix as prescribed. Andrea Montes will follow up with our clinic in 2 weeks.  Pre-Diabetes Andrea Montes will continue to work on weight loss, exercise, and decreasing simple carbohydrates in her diet to help decrease the risk of diabetes. She was informed that eating too many simple carbohydrates or too many calories at one sitting increases the likelihood of GI side effects. We will check Hgb A1c and fasting insulin level today. Andrea Montes agreed to follow up with [name] as directed to monitor her progress.  Diabetes risk counseling Andrea Montes was given extended (15 minutes) diabetes prevention counseling today. She is 62 y.o. female and has risk factors for diabetes including obesity and prediabetes. We discussed intensive lifestyle modifications today with an emphasis on weight loss as well as increasing exercise and decreasing simple carbohydrates in her diet.  Depression Screen Andrea Montes had a mildly positive depression screening. Depression is commonly associated with obesity and often results in emotional eating behaviors. We will monitor this closely and work on CBT to help improve the non-hunger eating patterns. Referral to Psychology may be required if no improvement is seen as she continues in our clinic.  Obesity Mixtli is currently in the action stage of change and her goal is to continue with weight loss efforts. I recommend Janaki begin the structured treatment plan as follows:  She has agreed to follow the Category 3 plan Teauna has been instructed to eventually work up to a goal of 150 minutes of combined cardio and strengthening  exercise per week for weight loss and overall health benefits. We discussed the following Behavioral Modification Strategies today: planning for success,  increasing lean protein intake, decreasing simple carbohydrates  and work on meal planning and easy cooking plans   She was informed of the importance of frequent follow up visits to maximize her success with intensive lifestyle modifications for her multiple health conditions. She was informed we would discuss her lab results at her next visit unless there is a critical issue that needs to be addressed sooner. Andrea Montes agreed to keep her next visit at the agreed upon time to discuss these results.    OBESITY BEHAVIORAL INTERVENTION VISIT  Today's visit was # 1   Starting weight: 270 lbs Starting date: 05/07/18 Today's weight : 270 lbs Today's date: 05/07/2018 Total lbs lost to date: 0   ASK: We discussed the diagnosis of obesity with [name] today and Samyria agreed to give [name] permission to discuss obesity behavioral modification therapy today.  ASSESS: Melina has the diagnosis of obesity and her BMI today is 47.84 Christle is in the action stage of change   ADVISE:  Undrea was educated on the multiple health risks of obesity as well as the benefit of weight loss to improve her health. She was advised of the need for long term treatment and the importance of lifestyle modifications to improve her current health and to decrease her risk of future health problems.  AGREE: Multiple dietary modification options and treatment options were discussed and  Krystyna agreed to follow the recommendations documented in the above note.  ARRANGE: Blessing was educated on the importance of frequent visits to treat obesity as outlined per CMS and USPSTF guidelines and agreed to schedule her next follow up appointment today.  I, Doreene Nest, am acting as transcriptionist for Eber Jones, MD  I have reviewed the above documentation  for accuracy and completeness, and I agree with the above. - Ilene Qua, MD

## 2018-05-12 ENCOUNTER — Encounter (INDEPENDENT_AMBULATORY_CARE_PROVIDER_SITE_OTHER): Payer: Self-pay | Admitting: Family Medicine

## 2018-05-21 ENCOUNTER — Encounter (INDEPENDENT_AMBULATORY_CARE_PROVIDER_SITE_OTHER): Payer: Self-pay | Admitting: Orthopedic Surgery

## 2018-05-21 ENCOUNTER — Ambulatory Visit (INDEPENDENT_AMBULATORY_CARE_PROVIDER_SITE_OTHER): Payer: 59 | Admitting: Family Medicine

## 2018-05-21 ENCOUNTER — Ambulatory Visit (INDEPENDENT_AMBULATORY_CARE_PROVIDER_SITE_OTHER): Payer: 59 | Admitting: Orthopedic Surgery

## 2018-05-21 VITALS — BP 124/74 | HR 77 | Temp 97.8°F | Ht 63.0 in | Wt 266.0 lb

## 2018-05-21 DIAGNOSIS — Z6841 Body Mass Index (BMI) 40.0 and over, adult: Secondary | ICD-10-CM | POA: Diagnosis not present

## 2018-05-21 DIAGNOSIS — E559 Vitamin D deficiency, unspecified: Secondary | ICD-10-CM | POA: Diagnosis not present

## 2018-05-21 DIAGNOSIS — E1169 Type 2 diabetes mellitus with other specified complication: Secondary | ICD-10-CM

## 2018-05-21 DIAGNOSIS — Z9189 Other specified personal risk factors, not elsewhere classified: Secondary | ICD-10-CM

## 2018-05-21 DIAGNOSIS — G5601 Carpal tunnel syndrome, right upper limb: Secondary | ICD-10-CM

## 2018-05-21 DIAGNOSIS — R200 Anesthesia of skin: Secondary | ICD-10-CM

## 2018-05-21 DIAGNOSIS — R202 Paresthesia of skin: Secondary | ICD-10-CM | POA: Diagnosis not present

## 2018-05-21 MED ORDER — METFORMIN HCL 500 MG PO TABS: 500.0000 mg | ORAL_TABLET | Freq: Every day | ORAL | 0 refills | Status: DC

## 2018-05-21 MED ORDER — VITAMIN D (ERGOCALCIFEROL) 1.25 MG (50000 UNIT) PO CAPS: 50000.0000 [IU] | ORAL_CAPSULE | ORAL | 0 refills | Status: DC

## 2018-05-21 MED FILL — VIT D2 1.25 MG (50,000 UNIT: 1.25 MG | 28 days supply | Qty: 4 | Fill #0

## 2018-05-21 MED FILL — metFORMIN HCL 500 MG TABS: 500 | 30 days supply | Qty: 30 | Fill #0

## 2018-05-21 NOTE — Progress Notes (Signed)
Office Visit Note   Patient: Andrea Montes           Date of Birth: [birth year]           MRN: [id number] Visit Date: 05/21/2018 Requested by: Seward Carol, MD [address] [place], [place] [address] PCP: Seward Carol, MD  Subjective: Chief Complaint  Patient presents with  . Right Hand - Numbness    HPI: Patient presents with right hand numbness.  Is been going on for about a month.  She is right-hand dominant.  Had left hand carpal tunnel syndrome with surgery years ago and had good result with that.  Patient states that this hand numbness and tingling will wake her from sleep at night.  She is been taking Mobic and ibuprofen without much relief.  She had previous trigger finger release years ago.  Denies much in the way of neck symptoms              ROS: All systems reviewed are negative as they relate to the chief complaint within the history of present illness.  Patient denies  fevers or chills.   Assessment & Plan: Visit Diagnoses:  1. Numbness and tingling in right hand   2. Carpal tunnel syndrome, right upper limb     Plan: Impression is right hand numbness and tingling.  I think she has carpal tunnel syndrome.  We will get her a splint to wear at night and then do EMG nerve study on that right hand to evaluate for carpal tunnel syndrome.  I will see her back after that study with Dr. Ernestina Patches.  Follow-Up Instructions: No follow-ups on file.   Orders:  Orders Placed This Encounter  Procedures  . Ambulatory referral to Physical Medicine Rehab   No orders of the defined types were placed in this encounter.     Procedures: No procedures performed   Clinical Data: No additional findings.  Objective: Vital Signs: There were no vitals taken for this visit.  Physical Exam:   Constitutional: Patient appears well-developed HEENT:  Head: Normocephalic Eyes:EOM are normal Neck: Normal range of motion Cardiovascular: Normal  rate Pulmonary/chest: Effort normal Neurologic: Patient is alert Skin: Skin is warm Psychiatric: Patient has normal mood and affect    Ortho Exam: Ortho exam demonstrates full active and passive range of motion of that right hand.  She does have negative Tinel's cubital tunnel.  Grip strength is intact.  Radial pulses intact.  EPL FPL interosseous function is intact.  She does describe paresthesias in digits 3 and 4.  No dorsal sided paresthesias.  Specialty Comments:  No specialty comments available.  Imaging: No results found.   PMFS History: Patient Active Problem List   Diagnosis Date Noted  . Trigger finger, acquired 09/09/2015  . Low back pain radiating to both legs 05/06/2012  . Leg length discrepancy 05/06/2012  . HYPERTENSION, BENIGN 04/11/2009  . LEG EDEMA, BILATERAL 10/08/2008  . 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 OVARIAN FAILURE 09/02/2007  . VITAMIN D DEFICIENCY 09/02/2007  . OBESITY, NOS 10/17/2006  . ANEMIA, IRON DEFICIENCY, UNSPEC. 10/17/2006  . CARPAL TUNNEL SYNDROME 10/17/2006   Past Medical History:  Diagnosis Date  . Anemia    no current med.  [name] Apnea   . 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 Back pain   . Carpal tunnel syndrome   . Complication of anesthesia    itching from last surgery that was completed at  the surgery center off of [address] patient was given benedryl, generalized itching and they are not sure where that came from  . DDD (degenerative disc disease), lumbar   . Dental crowns present   . Edema of lower extremity    bilateral  . Frozen shoulder    surgery 11-04-14  . GERD (gastroesophageal reflux disease)    no current med.  [name] GERD (gastroesophageal reflux disease)   . History of blood transfusion   . History of shingles   . Hypertension    borderline not on medicaiton at this time  . Impingement syndrome of left shoulder region 10/2014  . Joint pain   . Migraines   . Obesity    . Rheumatoid arteritis ([place])   . Sleep apnea    uses CPAP nightly  . Trigger finger, left middle finger   . Vitamin D deficiency   . Wears partial dentures    upper    Family History  Problem Relation Age of Onset  . Kidney disease Brother   . Hypertension Brother   . Leukemia Mother   . Heart disease Mother   . Stroke Mother   . Cancer Mother   . Depression Mother   . Anxiety disorder Mother   . AAA (abdominal aortic aneurysm) Father   . Esophageal cancer Maternal Uncle   . Hypertension Brother   . Colon cancer Neg Hx   . Colon polyps Neg Hx   . Rectal cancer Neg Hx   . Stomach cancer Neg Hx     Past Surgical History:  Procedure Laterality Date  . CARPAL TUNNEL RELEASE Left 04/25/2006  . CLOSED MANIPULATION SHOULDER WITH STERIOD INJECTION Left 02/03/2015   Procedure: LEFT  MANIPULATION SHOULDER UNDER ANESTHESIA WITH INJECTION OF STEROID;  Surgeon: Kathryne Hitch, MD;  Location: [facility];  Service: Orthopedics;  Laterality: Left;  . COLONOSCOPY    . COLONOSCOPY WITH PROPOFOL  10/09/2011  . POLYPECTOMY    . RESECTION DISTAL CLAVICAL Left 11/04/2014   Procedure: RESECTION DISTAL CLAVICAL;  Surgeon: Kathryne Hitch, MD;  Location: [facility];  Service: Orthopedics;  Laterality: Left;  . ROTATOR CUFF REPAIR  2010  . SHOULDER ARTHROSCOPY WITH ROTATOR CUFF REPAIR Right 10/04/2008  . SHOULDER ARTHROSCOPY WITH SUBACROMIAL DECOMPRESSION, ROTATOR CUFF REPAIR AND BICEP TENDON REPAIR Left 11/04/2014   Procedure: LEFT SHOULDER ARTHROSCOPY WITH DEBRIDEMENT, DISTAL CLAVICLE EXCISION, ACROMIOPLASTY, ROTATOR CUFF REPAIR AND BICEP TENODESIS;  Surgeon: Kathryne Hitch, MD;  Location: [facility];  Service: Orthopedics;  Laterality: Left;  . TRIGGER FINGER RELEASE Right 02/03/2015   Procedure: RIGHT LONG FINGER TRIGGER RELEASE;  Surgeon: Kathryne Hitch, MD;  Location: [facility];  Service: Orthopedics;  Laterality: Right;  . TRIGGER FINGER  RELEASE Left 10/10/2017   Procedure: LEFT HAND RELEASE TRIGGER FINGER 3RD LONG FINGER AND CYST EXCISION;  Surgeon: Meredith Pel, MD;  Location: [facility];  Service: Orthopedics;  Laterality: Left;  [name] VAGINAL HYSTERECTOMY  2001   partial   Social History   Occupational History  . Not on file  Tobacco Use  . Smoking status: Never Smoker  . Smokeless tobacco: Never Used  Substance and Sexual Activity  . Alcohol use: No  . Drug use: No  . Sexual activity: Not on file

## 2018-05-22 ENCOUNTER — Ambulatory Visit (INDEPENDENT_AMBULATORY_CARE_PROVIDER_SITE_OTHER): Payer: 59 | Admitting: Orthopedic Surgery

## 2018-05-22 NOTE — Progress Notes (Signed)
Office: [phone]  /  Fax: [phone]   HPI:   Chief Complaint: OBESITY Andrea Montes is here to discuss her progress with her obesity treatment plan. She is on the Category 3 plan and is following her eating plan approximately 95 % of the time. She states she is walking for 20 minutes 3 times per week. Andrea Montes states couldn't do milk. She denies drinking soda, eating white bread or anything fried. She notes she is eating all food except milk. She notes her hunger is controlled and she may be looking for other options for breakfast. Her weight is 266 lb (120.7 kg) today and has had a weight loss of 4 pounds over a period of 2 weeks since her last visit. She has lost 4 lbs since starting treatment with [name].  Vitamin D Deficiency Andrea Montes has a diagnosis of vitamin D deficiency. She is not currently taking Vit D. She notes fatigue and denies nausea, vomiting or muscle weakness.  At risk for osteopenia and osteoporosis Andrea Montes is at higher risk of osteopenia and osteoporosis due to vitamin D deficiency.   Diabetes II Andrea Montes has a new diagnosis of diabetes type II. She was previously diagnosed with pre-diabetes. She just had an eye exam. Andrea Montes denies any hypoglycemic episodes. Last A1c was 6.7. She has been working on intensive lifestyle modifications including diet, exercise, and weight loss to help control her blood glucose levels.  ALLERGIES: No Known Allergies  MEDICATIONS: Current Outpatient Medications on File Prior to Visit  Medication Sig Dispense Refill  . bacitracin 500 UNIT/GM ointment Apply 1 application topically 2 (two) times daily. 15 g 0  . furosemide (LASIX) 20 MG tablet Take 1 tablet (20 mg total) by mouth daily. (Patient taking differently: Take 20 mg by mouth daily as needed for fluid. ) 30 tablet 11  . meloxicam (MOBIC) 15 MG tablet Take 1 tablet (15 mg total) by mouth daily as needed for pain. 60 tablet 1  . montelukast (SINGULAIR) 10 MG tablet Take 1 tablet (10 mg total)  by mouth at bedtime. 30 tablet 0  . phenazopyridine (PYRIDIUM) 100 MG tablet Take 1 tablet (100 mg total) by mouth 3 (three) times daily as needed for pain. 10 tablet 0  . PROAIR HFA 108 (90 Base) MCG/ACT inhaler INHALE TWO PUFFS BY MOUTH EVERY 4 TO 6 HOURS AS NEEDED FOR SHORTNESS OF BREATH  0  . SUMAtriptan (IMITREX) 25 MG tablet Take 25 mg by mouth every 2 (two) hours as needed for migraine or headache.   1  . traMADol (ULTRAM) 50 MG tablet Take 1 tablet (50 mg total) by mouth every 6 (six) hours as needed. (Patient taking differently: Take 50 mg by mouth every 6 (six) hours as needed (for pain.). ) 60 tablet 2  . albuterol (PROVENTIL HFA;VENTOLIN HFA) 108 (90 Base) MCG/ACT inhaler Inhale 2 puffs into the lungs every 6 (six) hours as needed for up to 10 days for wheezing or shortness of breath. 1 Inhaler 0  . fluticasone (FLONASE) 50 MCG/ACT nasal spray Place 2 sprays into both nostrils daily for 10 days. 16 g 0   Current Facility-Administered Medications on File Prior to Visit  Medication Dose Route Frequency Provider Last Rate Last Dose  . 0.9 %  sodium chloride infusion  500 mL Intravenous Continuous Nandigam, Venia Minks, MD        PAST MEDICAL HISTORY: Past Medical History:  Diagnosis Date  . Anemia    no current med.  [name] Apnea   .  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 Back pain   . Carpal tunnel syndrome   . Complication of anesthesia    itching from last surgery that was completed at the surgery center off of [address] patient was given benedryl, generalized itching and they are not sure where that came from  . DDD (degenerative disc disease), lumbar   . Dental crowns present   . Edema of lower extremity    bilateral  . Frozen shoulder    surgery 11-04-14  . GERD (gastroesophageal reflux disease)    no current med.  [name] GERD (gastroesophageal reflux disease)   . History of blood transfusion   . History of shingles   . Hypertension    borderline not on  medicaiton at this time  . Impingement syndrome of left shoulder region 10/2014  . Joint pain   . Migraines   . Obesity   . Rheumatoid arteritis ([place])   . Sleep apnea    uses CPAP nightly  . Trigger finger, left middle finger   . Vitamin D deficiency   . Wears partial dentures    upper    PAST SURGICAL HISTORY: Past Surgical History:  Procedure Laterality Date  . CARPAL TUNNEL RELEASE Left 04/25/2006  . CLOSED MANIPULATION SHOULDER WITH STERIOD INJECTION Left 02/03/2015   Procedure: LEFT  MANIPULATION SHOULDER UNDER ANESTHESIA WITH INJECTION OF STEROID;  Surgeon: Kathryne Hitch, MD;  Location: [facility];  Service: Orthopedics;  Laterality: Left;  . COLONOSCOPY    . COLONOSCOPY WITH PROPOFOL  10/09/2011  . POLYPECTOMY    . RESECTION DISTAL CLAVICAL Left 11/04/2014   Procedure: RESECTION DISTAL CLAVICAL;  Surgeon: Kathryne Hitch, MD;  Location: [facility];  Service: Orthopedics;  Laterality: Left;  . ROTATOR CUFF REPAIR  2010  . SHOULDER ARTHROSCOPY WITH ROTATOR CUFF REPAIR Right 10/04/2008  . SHOULDER ARTHROSCOPY WITH SUBACROMIAL DECOMPRESSION, ROTATOR CUFF REPAIR AND BICEP TENDON REPAIR Left 11/04/2014   Procedure: LEFT SHOULDER ARTHROSCOPY WITH DEBRIDEMENT, DISTAL CLAVICLE EXCISION, ACROMIOPLASTY, ROTATOR CUFF REPAIR AND BICEP TENODESIS;  Surgeon: Kathryne Hitch, MD;  Location: [facility];  Service: Orthopedics;  Laterality: Left;  . TRIGGER FINGER RELEASE Right 02/03/2015   Procedure: RIGHT LONG FINGER TRIGGER RELEASE;  Surgeon: Kathryne Hitch, MD;  Location: [facility];  Service: Orthopedics;  Laterality: Right;  . TRIGGER FINGER RELEASE Left 10/10/2017   Procedure: LEFT HAND RELEASE TRIGGER FINGER 3RD LONG FINGER AND CYST EXCISION;  Surgeon: Meredith Pel, MD;  Location: [facility];  Service: Orthopedics;  Laterality: Left;  [name] VAGINAL HYSTERECTOMY  2001   partial    SOCIAL HISTORY: Social History   Tobacco Use  . Smoking  status: Never Smoker  . Smokeless tobacco: Never Used  Substance Use Topics  . Alcohol use: No  . Drug use: No    FAMILY HISTORY: Family History  Problem Relation Age of Onset  . Kidney disease Brother   . Hypertension Brother   . Leukemia Mother   . Heart disease Mother   . Stroke Mother   . Cancer Mother   . Depression Mother   . Anxiety disorder Mother   . AAA (abdominal aortic aneurysm) Father   . Esophageal cancer Maternal Uncle   . Hypertension Brother   . Colon cancer Neg Hx   . Colon polyps Neg Hx   . Rectal cancer Neg Hx   . Stomach cancer Neg Hx     ROS: Review of Systems  Constitutional: Positive for malaise/fatigue and weight loss.  Gastrointestinal: Negative for nausea and vomiting.  Musculoskeletal:       Negative muscle weakness  Endo/Heme/Allergies:       Negative hypoglycemia    PHYSICAL EXAM: Blood pressure 124/74, pulse 77, temperature 97.8 F (36.6 C), temperature source Oral, height 5\' 3"  (1.6 m), weight 266 lb (120.7 kg), SpO2 96 %. Body mass index is 47.12 kg/m. Physical Exam  Constitutional: She is oriented to person, place, and time. She appears well-developed and well-nourished.  Cardiovascular: Normal rate.  Pulmonary/Chest: Effort normal.  Musculoskeletal: Normal range of motion.  Neurological: She is oriented to person, place, and time.  Skin: Skin is warm and dry.  Psychiatric: She has a normal mood and affect. Her behavior is normal.  Vitals reviewed.   RECENT LABS AND TESTS: BMET    Component Value Date/Time   NA 139 05/07/2018 1424   K 3.9 05/07/2018 1424   CL 100 05/07/2018 1424   CO2 25 05/07/2018 1424   GLUCOSE 99 05/07/2018 1424   GLUCOSE 136 (H) 10/08/2017 0841   BUN 10 05/07/2018 1424   CREATININE 0.82 05/07/2018 1424   CREATININE 0.85 03/07/2016 1654   CALCIUM 8.7 05/07/2018 1424   GFRNONAA 78 05/07/2018 1424   GFRAA 90 05/07/2018 1424   Lab Results  Component Value Date   HGBA1C 6.7 (H) 05/07/2018   Lab  Results  Component Value Date   INSULIN 39.8 (H) 05/07/2018   CBC    Component Value Date/Time   WBC 6.5 05/07/2018 1424   WBC 16.9 (H) 10/08/2017 0841   RBC 5.34 (H) 05/07/2018 1424   RBC 5.75 (H) 10/08/2017 0841   HGB 11.8 05/07/2018 1424   HCT 36.9 05/07/2018 1424   PLT 233 10/08/2017 0841   MCV 69 (L) 05/07/2018 1424   MCH 22.1 (L) 05/07/2018 1424   MCH 22.1 (L) 10/08/2017 0841   MCHC 32.0 05/07/2018 1424   MCHC 31.4 10/08/2017 0841   RDW 16.4 (H) 05/07/2018 1424   LYMPHSABS 2.9 05/07/2018 1424   MONOABS 0.5 07/14/2017 2115   EOSABS 0.1 05/07/2018 1424   BASOSABS 0.0 05/07/2018 1424   Iron/TIBC/Ferritin/ %Sat No results found for: IRON, TIBC, FERRITIN, IRONPCTSAT Lipid Panel     Component Value Date/Time   CHOL 130 05/07/2018 1424   TRIG 89 05/07/2018 1424   HDL 35 (L) 05/07/2018 1424   CHOLHDL 4.1 Ratio 09/26/2009 1823   VLDL 26 09/26/2009 1823   LDLCALC 77 05/07/2018 1424   Hepatic Function Panel     Component Value Date/Time   PROT 6.9 05/07/2018 1424   ALBUMIN 3.7 05/07/2018 1424   AST 17 05/07/2018 1424   ALT 6 05/07/2018 1424   ALKPHOS 93 05/07/2018 1424   BILITOT 0.2 05/07/2018 1424      Component Value Date/Time   TSH 1.470 05/07/2018 1424   TSH 1.55 11/25/2008 1700  Results for LINDELL, TUSSEY (MRN [id number]) as of 05/22/2018 13:25  Ref. Range 05/07/2018 14:24  Vitamin D, 25-Hydroxy Latest Ref Range: 30.0 - 100.0 ng/mL 14.4 (L)    ASSESSMENT AND PLAN: Vitamin D deficiency - Plan: Vitamin D, Ergocalciferol, (DRISDOL) 50000 units CAPS capsule  Controlled type 2 diabetes mellitus with other specified complication, without long-term current use of insulin (HCC) - Plan: metFORMIN (GLUCOPHAGE) 500 MG tablet  At risk for osteoporosis  Class 3 severe obesity with serious comorbidity and body mass index (BMI) of 45.0 to 49.9 in adult, unspecified obesity type ([place])  PLAN:  Vitamin D Deficiency Deardra was informed that low vitamin D levels  contributes to fatigue and are associated with obesity, breast, and colon cancer. Niharika agrees to start prescription Vit D @50 ,000 IU every week #4 with no refills. She will follow up for routine testing of vitamin D, at least 2-3 times per year. She was informed of the risk of over-replacement of vitamin D and agrees to not increase her dose unless she discusses this with [name] first. [place]ola agrees to follow up with our clinic in 2 weeks.  At risk for osteopenia and osteoporosis Kristelle was given extended (30 minutes) osteoporosis prevention counseling today. Thereasa is at risk for osteopenia and osteoporsis due to her vitamin D deficiency. She was encouraged to take her vitamin D and follow her higher calcium diet and increase strengthening exercise to help strengthen her bones and decrease her risk of osteopenia and osteoporosis.  Diabetes II Brailynn has been given extensive diabetes education by myself today including ideal fasting and post-prandial blood glucose readings, individual ideal Hgb A1c goals and hypoglycemia prevention. We discussed the importance of good blood sugar control to decrease the likelihood of diabetic complications such as nephropathy, neuropathy, limb loss, blindness, coronary artery disease, and death. We discussed the importance of intensive lifestyle modification including diet, exercise and weight loss as the first line treatment for diabetes. Sherrica agrees to start metformin 500 mg PO q AM #30 with no refills. Carson agrees to follow up with our clinic in 2 weeks.  Obesity Naphtali is currently in the action stage of change. As such, her goal is to continue with weight loss efforts She has agreed to follow the Category 3 plan Belina has been instructed to work up to a goal of 150 minutes of combined cardio and strengthening exercise per week for weight loss and overall health benefits. We discussed the following Behavioral Modification Strategies today: increasing lean  protein intake, increasing vegetables, work on meal planning and easy cooking plans, and planning for success   Charlette has agreed to follow up with our clinic in 2 weeks. She was informed of the importance of frequent follow up visits to maximize her success with intensive lifestyle modifications for her multiple health conditions.   OBESITY BEHAVIORAL INTERVENTION VISIT  Today's visit was # 2   Starting weight: 270 lbs Starting date: 05/07/18 Today's weight : 266 lbs  Today's date: 05/21/2018 Total lbs lost to date: 4    ASK: We discussed the diagnosis of obesity with [name] today and Matilde agreed to give [name] permission to discuss obesity behavioral modification therapy today.  ASSESS: Dayzha has the diagnosis of obesity and her BMI today is 47.13 Maliah is in the action stage of change   ADVISE: Kenyona was educated on the multiple health risks of obesity as well as the benefit of weight loss to improve her health. She was advised of the need for long term treatment and the importance of lifestyle modifications to improve her current health and to decrease her risk of future health problems.  AGREE: Multiple dietary modification options and treatment options were discussed and  Jamella agreed to follow the recommendations documented in the above note.  ARRANGE: Tishara was educated on the importance of frequent visits to treat obesity as outlined per CMS and USPSTF guidelines and agreed to schedule her next follow up appointment today.  I, Emberlee Sortino, am acting as transcriptionist for Ilene Qua, MD  I have reviewed the above documentation for accuracy and completeness,  and I agree with the above. - Ilene Qua, MD

## 2018-06-05 ENCOUNTER — Ambulatory Visit (INDEPENDENT_AMBULATORY_CARE_PROVIDER_SITE_OTHER): Payer: 59 | Admitting: Family Medicine

## 2018-06-05 VITALS — BP 142/82 | HR 65 | Temp 97.8°F | Ht 63.0 in | Wt 264.0 lb

## 2018-06-05 DIAGNOSIS — Z6841 Body Mass Index (BMI) 40.0 and over, adult: Secondary | ICD-10-CM | POA: Diagnosis not present

## 2018-06-05 DIAGNOSIS — Z9189 Other specified personal risk factors, not elsewhere classified: Secondary | ICD-10-CM | POA: Diagnosis not present

## 2018-06-05 DIAGNOSIS — E559 Vitamin D deficiency, unspecified: Secondary | ICD-10-CM

## 2018-06-05 DIAGNOSIS — E119 Type 2 diabetes mellitus without complications: Secondary | ICD-10-CM

## 2018-06-05 MED ORDER — ATORVASTATIN CALCIUM 10 MG PO TABS: 10.0000 mg | ORAL_TABLET | Freq: Every day | ORAL | 0 refills | Status: DC

## 2018-06-05 MED ORDER — LISINOPRIL 2.5 MG PO TABS: 2.5000 mg | ORAL_TABLET | Freq: Every day | ORAL | 0 refills | Status: DC

## 2018-06-05 MED ORDER — VITAMIN D (ERGOCALCIFEROL) 1.25 MG (50000 UNIT) PO CAPS: 50000.0000 [IU] | ORAL_CAPSULE | ORAL | 0 refills | Status: DC

## 2018-06-05 MED FILL — LISINOPRIL 2.5 MG TABLET: 2.5 | 30 days supply | Qty: 30 | Fill #0

## 2018-06-05 MED FILL — ATORVASTATIN 10 MG TABLET: 10 | 30 days supply | Qty: 30 | Fill #0

## 2018-06-09 NOTE — Progress Notes (Signed)
Office: [phone]  /  Fax: [phone]   HPI:   Chief Complaint: OBESITY Andrea Montes is here to discuss her progress with her obesity treatment plan. She is on the Category 3 plan and is following her eating plan approximately 100 % of the time. She states she is walking for 30 minutes 3 times per week. Andrea Montes found it easy to follow the plan the past few weeks. She notes hunger was controlled and denies cravings. She did well following the plan at her granddaughter's baby shower. She has [employer] homecoming coming up.  Her weight is 264 lb (119.7 kg) today and has had a weight loss of 2 pounds over a period of 2 weeks since her last visit. She has lost 6 lbs since starting treatment with [name].  Vitamin D Deficiency Andrea Montes has a diagnosis of vitamin D deficiency. She is currently taking prescription Vit D. She notes fatigue and denies nausea, vomiting or muscle weakness.  At risk for osteopenia and osteoporosis Andrea Montes is at higher risk of osteopenia and osteoporosis due to vitamin D deficiency.   Diabetes II Andrea Montes has a diagnosis of diabetes type II. Linde denies carbohydrate cravings and hasn't had soda not even 1 time. She denies any hypoglycemic episodes. Last A1c was 6.7. She has been working on intensive lifestyle modifications including diet, exercise, and weight loss to help control her blood glucose levels.  ALLERGIES: No Known Allergies  MEDICATIONS: Current Outpatient Medications on File Prior to Visit  Medication Sig Dispense Refill  . bacitracin 500 UNIT/GM ointment Apply 1 application topically 2 (two) times daily. 15 g 0  . furosemide (LASIX) 20 MG tablet Take 1 tablet (20 mg total) by mouth daily. (Patient taking differently: Take 20 mg by mouth daily as needed for fluid. ) 30 tablet 11  . meloxicam (MOBIC) 15 MG tablet Take 1 tablet (15 mg total) by mouth daily as needed for pain. 60 tablet 1  . metFORMIN (GLUCOPHAGE) 500 MG tablet Take 1 tablet (500 mg total) by mouth  daily with breakfast. 30 tablet 0  . montelukast (SINGULAIR) 10 MG tablet Take 1 tablet (10 mg total) by mouth at bedtime. 30 tablet 0  . phenazopyridine (PYRIDIUM) 100 MG tablet Take 1 tablet (100 mg total) by mouth 3 (three) times daily as needed for pain. 10 tablet 0  . PROAIR HFA 108 (90 Base) MCG/ACT inhaler INHALE TWO PUFFS BY MOUTH EVERY 4 TO 6 HOURS AS NEEDED FOR SHORTNESS OF BREATH  0  . SUMAtriptan (IMITREX) 25 MG tablet Take 25 mg by mouth every 2 (two) hours as needed for migraine or headache.   1  . traMADol (ULTRAM) 50 MG tablet Take 1 tablet (50 mg total) by mouth every 6 (six) hours as needed. (Patient taking differently: Take 50 mg by mouth every 6 (six) hours as needed (for pain.). ) 60 tablet 2  . albuterol (PROVENTIL HFA;VENTOLIN HFA) 108 (90 Base) MCG/ACT inhaler Inhale 2 puffs into the lungs every 6 (six) hours as needed for up to 10 days for wheezing or shortness of breath. 1 Inhaler 0  . fluticasone (FLONASE) 50 MCG/ACT nasal spray Place 2 sprays into both nostrils daily for 10 days. 16 g 0   Current Facility-Administered Medications on File Prior to Visit  Medication Dose Route Frequency Provider Last Rate Last Dose  . 0.9 %  sodium chloride infusion  500 mL Intravenous Continuous Nandigam, Venia Minks, MD        PAST MEDICAL HISTORY: Past  Medical History:  Diagnosis Date  . Anemia    no current med.  [name] Apnea   . 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 Back pain   . Carpal tunnel syndrome   . Complication of anesthesia    itching from last surgery that was completed at the surgery center off of [address] patient was given benedryl, generalized itching and they are not sure where that came from  . DDD (degenerative disc disease), lumbar   . Dental crowns present   . Edema of lower extremity    bilateral  . Frozen shoulder    surgery 11-04-14  . GERD (gastroesophageal reflux disease)    no current med.  [name] GERD (gastroesophageal reflux  disease)   . History of blood transfusion   . History of shingles   . Hypertension    borderline not on medicaiton at this time  . Impingement syndrome of left shoulder region 10/2014  . Joint pain   . Migraines   . Obesity   . Rheumatoid arteritis ([place])   . Sleep apnea    uses CPAP nightly  . Trigger finger, left middle finger   . Vitamin D deficiency   . Wears partial dentures    upper    PAST SURGICAL HISTORY: Past Surgical History:  Procedure Laterality Date  . CARPAL TUNNEL RELEASE Left 04/25/2006  . CLOSED MANIPULATION SHOULDER WITH STERIOD INJECTION Left 02/03/2015   Procedure: LEFT  MANIPULATION SHOULDER UNDER ANESTHESIA WITH INJECTION OF STEROID;  Surgeon: Kathryne Hitch, MD;  Location: [facility];  Service: Orthopedics;  Laterality: Left;  . COLONOSCOPY    . COLONOSCOPY WITH PROPOFOL  10/09/2011  . POLYPECTOMY    . RESECTION DISTAL CLAVICAL Left 11/04/2014   Procedure: RESECTION DISTAL CLAVICAL;  Surgeon: Kathryne Hitch, MD;  Location: [facility];  Service: Orthopedics;  Laterality: Left;  . ROTATOR CUFF REPAIR  2010  . SHOULDER ARTHROSCOPY WITH ROTATOR CUFF REPAIR Right 10/04/2008  . SHOULDER ARTHROSCOPY WITH SUBACROMIAL DECOMPRESSION, ROTATOR CUFF REPAIR AND BICEP TENDON REPAIR Left 11/04/2014   Procedure: LEFT SHOULDER ARTHROSCOPY WITH DEBRIDEMENT, DISTAL CLAVICLE EXCISION, ACROMIOPLASTY, ROTATOR CUFF REPAIR AND BICEP TENODESIS;  Surgeon: Kathryne Hitch, MD;  Location: [facility];  Service: Orthopedics;  Laterality: Left;  . TRIGGER FINGER RELEASE Right 02/03/2015   Procedure: RIGHT LONG FINGER TRIGGER RELEASE;  Surgeon: Kathryne Hitch, MD;  Location: [facility];  Service: Orthopedics;  Laterality: Right;  . TRIGGER FINGER RELEASE Left 10/10/2017   Procedure: LEFT HAND RELEASE TRIGGER FINGER 3RD LONG FINGER AND CYST EXCISION;  Surgeon: Meredith Pel, MD;  Location: [facility];  Service: Orthopedics;  Laterality: Left;  [name]  VAGINAL HYSTERECTOMY  2001   partial    SOCIAL HISTORY: Social History   Tobacco Use  . Smoking status: Never Smoker  . Smokeless tobacco: Never Used  Substance Use Topics  . Alcohol use: No  . Drug use: No    FAMILY HISTORY: Family History  Problem Relation Age of Onset  . Kidney disease Brother   . Hypertension Brother   . Leukemia Mother   . Heart disease Mother   . Stroke Mother   . Cancer Mother   . Depression Mother   . Anxiety disorder Mother   . AAA (abdominal aortic aneurysm) Father   . Esophageal cancer Maternal Uncle   . Hypertension Brother   . Colon cancer Neg Hx   . Colon polyps Neg Hx   .  Rectal cancer Neg Hx   . Stomach cancer Neg Hx     ROS: Review of Systems  Constitutional: Positive for malaise/fatigue and weight loss.  Gastrointestinal: Negative for nausea and vomiting.  Musculoskeletal:       Negative muscle weakness  Endo/Heme/Allergies:       Negative hypoglycemia    PHYSICAL EXAM: Blood pressure (!) 142/82, pulse 65, temperature 97.8 F (36.6 C), temperature source Oral, height 5\' 3"  (1.6 m), weight 264 lb (119.7 kg), SpO2 99 %. Body mass index is 46.77 kg/m. Physical Exam  Constitutional: She is oriented to person, place, and time. She appears well-developed and well-nourished.  Cardiovascular: Normal rate.  Pulmonary/Chest: Effort normal.  Musculoskeletal: Normal range of motion.  Neurological: She is oriented to person, place, and time.  Skin: Skin is warm and dry.  Psychiatric: She has a normal mood and affect. Her behavior is normal.  Vitals reviewed.   RECENT LABS AND TESTS: BMET    Component Value Date/Time   NA 139 05/07/2018 1424   K 3.9 05/07/2018 1424   CL 100 05/07/2018 1424   CO2 25 05/07/2018 1424   GLUCOSE 99 05/07/2018 1424   GLUCOSE 136 (H) 10/08/2017 0841   BUN 10 05/07/2018 1424   CREATININE 0.82 05/07/2018 1424   CREATININE 0.85 03/07/2016 1654   CALCIUM 8.7 05/07/2018 1424   GFRNONAA 78 05/07/2018  1424   GFRAA 90 05/07/2018 1424   Lab Results  Component Value Date   HGBA1C 6.7 (H) 05/07/2018   Lab Results  Component Value Date   INSULIN 39.8 (H) 05/07/2018   CBC    Component Value Date/Time   WBC 6.5 05/07/2018 1424   WBC 16.9 (H) 10/08/2017 0841   RBC 5.34 (H) 05/07/2018 1424   RBC 5.75 (H) 10/08/2017 0841   HGB 11.8 05/07/2018 1424   HCT 36.9 05/07/2018 1424   PLT 233 10/08/2017 0841   MCV 69 (L) 05/07/2018 1424   MCH 22.1 (L) 05/07/2018 1424   MCH 22.1 (L) 10/08/2017 0841   MCHC 32.0 05/07/2018 1424   MCHC 31.4 10/08/2017 0841   RDW 16.4 (H) 05/07/2018 1424   LYMPHSABS 2.9 05/07/2018 1424   MONOABS 0.5 07/14/2017 2115   EOSABS 0.1 05/07/2018 1424   BASOSABS 0.0 05/07/2018 1424   Iron/TIBC/Ferritin/ %Sat No results found for: IRON, TIBC, FERRITIN, IRONPCTSAT Lipid Panel     Component Value Date/Time   CHOL 130 05/07/2018 1424   TRIG 89 05/07/2018 1424   HDL 35 (L) 05/07/2018 1424   CHOLHDL 4.1 Ratio 09/26/2009 1823   VLDL 26 09/26/2009 1823   LDLCALC 77 05/07/2018 1424   Hepatic Function Panel     Component Value Date/Time   PROT 6.9 05/07/2018 1424   ALBUMIN 3.7 05/07/2018 1424   AST 17 05/07/2018 1424   ALT 6 05/07/2018 1424   ALKPHOS 93 05/07/2018 1424   BILITOT 0.2 05/07/2018 1424      Component Value Date/Time   TSH 1.470 05/07/2018 1424   TSH 1.55 11/25/2008 1700   Results for SUMIE, REMSEN (MRN [id number]) as of 06/09/2018 15:13  Ref. Range 05/07/2018 14:24  Vitamin D, 25-Hydroxy Latest Ref Range: 30.0 - 100.0 ng/mL 14.4 (L)   ASSESSMENT AND PLAN: Vitamin D deficiency - Plan: Vitamin D, Ergocalciferol, (DRISDOL) 50000 units CAPS capsule  Type 2 diabetes mellitus without complication, without long-term current use of insulin (HCC) - Plan: lisinopril (ZESTRIL) 2.5 MG tablet, atorvastatin (LIPITOR) 10 MG tablet  At risk for osteoporosis  Class 3  severe obesity with serious comorbidity and body mass index (BMI) of 45.0 to 49.9 in  adult, unspecified obesity type ([place])  PLAN:  Vitamin D Deficiency Andrea Montes was informed that low vitamin D levels contributes to fatigue and are associated with obesity, breast, and colon cancer. Andrea Montes agrees to continue taking prescription Vit D @50 ,000 IU every week #4 and we will refill for 1 month. She will follow up for routine testing of vitamin D, at least 2-3 times per year. She was informed of the risk of over-replacement of vitamin D and agrees to not increase her dose unless she discusses this with [name] first. Andrea Montes agrees to follow up with our clinic in 2 weeks.  At risk for osteopenia and osteoporosis Andrea Montes was given extended (30 minutes) osteoporosis prevention counseling today. Andrea Montes is at risk for osteopenia and osteoporsis due to her vitamin D deficiency. She was encouraged to take her vitamin D and follow her higher calcium diet and increase strengthening exercise to help strengthen her bones and decrease her risk of osteopenia and osteoporosis.  Diabetes II Andrea Montes has been given extensive diabetes education by myself today including ideal fasting and post-prandial blood glucose readings, individual ideal Hgb A1c goals and hypoglycemia prevention. We discussed the importance of good blood sugar control to decrease the likelihood of diabetic complications such as nephropathy, neuropathy, limb loss, blindness, coronary artery disease, and death. We discussed the importance of intensive lifestyle modification including diet, exercise and weight loss as the first line treatment for diabetes. Andrea Montes agrees to continue taking metformin, and she agrees to start lisinopril 2.5 mg PO q PM #30 with no refills. Andrea Montes agrees to start Lipitor 10 mg PO q PM #30 with no refills. Andrea Montes agrees to follow up with our clinic in 2 weeks.  Obesity Andrea Montes is currently in the action stage of change. As such, her goal is to continue with weight loss efforts She has agreed to follow the Category 3  plan Andrea Montes has been instructed to work up to a goal of 150 minutes of combined cardio and strengthening exercise per week for weight loss and overall health benefits. We discussed the following Behavioral Modification Strategies today: increasing lean protein intake, increasing vegetables, work on meal planning and easy cooking plans, and planning for success   Andrea Montes has agreed to follow up with our clinic in 2 weeks. She was informed of the importance of frequent follow up visits to maximize her success with intensive lifestyle modifications for her multiple health conditions.   OBESITY BEHAVIORAL INTERVENTION VISIT  Today's visit was # 3   Starting weight: 270 lbs Starting date: 05/07/18 Today's weight : 264 lbs  Today's date: 06/05/2018 Total lbs lost to date: 6 At least 15 minutes were spent on discussing the following behavioral intervention visit.   ASK: We discussed the diagnosis of obesity with Andrea Montes today and Andrea Montes agreed to give [name] permission to discuss obesity behavioral modification therapy today.  ASSESS: Andrea Montes has the diagnosis of obesity and her BMI today is 46.78 Andrea Montes is in the action stage of change   ADVISE: Andrea Montes was educated on the multiple health risks of obesity as well as the benefit of weight loss to improve her health. She was advised of the need for long term treatment and the importance of lifestyle modifications to improve her current health and to decrease her risk of future health problems.  AGREE: Multiple dietary modification options and treatment options were discussed and  Andrea Montes agreed to follow  the recommendations documented in the above note.  ARRANGE: Andrea Montes was educated on the importance of frequent visits to treat obesity as outlined per CMS and USPSTF guidelines and agreed to schedule her next follow up appointment today.  I, Shareese Macha, am acting as transcriptionist for Ilene Qua, MD  I have reviewed the  above documentation for accuracy and completeness, and I agree with the above. - Ilene Qua, MD

## 2018-06-12 MED FILL — VIT D2 1.25 MG (50,000 UNIT: 1.25 MG | 28 days supply | Qty: 4 | Fill #0

## 2018-06-18 ENCOUNTER — Ambulatory Visit (INDEPENDENT_AMBULATORY_CARE_PROVIDER_SITE_OTHER): Payer: 59 | Admitting: Physical Medicine and Rehabilitation

## 2018-06-18 ENCOUNTER — Encounter (INDEPENDENT_AMBULATORY_CARE_PROVIDER_SITE_OTHER): Payer: 59 | Admitting: Physical Medicine and Rehabilitation

## 2018-06-18 ENCOUNTER — Encounter (INDEPENDENT_AMBULATORY_CARE_PROVIDER_SITE_OTHER): Payer: Self-pay | Admitting: Physical Medicine and Rehabilitation

## 2018-06-18 ENCOUNTER — Ambulatory Visit (INDEPENDENT_AMBULATORY_CARE_PROVIDER_SITE_OTHER): Payer: 59 | Admitting: Family Medicine

## 2018-06-18 DIAGNOSIS — R202 Paresthesia of skin: Secondary | ICD-10-CM

## 2018-06-18 NOTE — Progress Notes (Signed)
  Numeric Pain Rating Scale and Functional Assessment Average Pain 8   In the last MONTH (on 0-10 scale) has pain interfered with the following?  1. General activity like being  able to carry out your everyday physical activities such as walking, climbing stairs, carrying groceries, or moving a chair?  Rating(4)

## 2018-06-19 ENCOUNTER — Ambulatory Visit (INDEPENDENT_AMBULATORY_CARE_PROVIDER_SITE_OTHER): Payer: 59 | Admitting: Family Medicine

## 2018-06-19 ENCOUNTER — Encounter (INDEPENDENT_AMBULATORY_CARE_PROVIDER_SITE_OTHER): Payer: Self-pay | Admitting: Family Medicine

## 2018-06-19 VITALS — BP 130/75 | HR 58 | Temp 98.0°F | Ht 63.0 in | Wt 264.0 lb

## 2018-06-19 DIAGNOSIS — Z9189 Other specified personal risk factors, not elsewhere classified: Secondary | ICD-10-CM | POA: Diagnosis not present

## 2018-06-19 DIAGNOSIS — E559 Vitamin D deficiency, unspecified: Secondary | ICD-10-CM

## 2018-06-19 DIAGNOSIS — Z6841 Body Mass Index (BMI) 40.0 and over, adult: Secondary | ICD-10-CM

## 2018-06-19 DIAGNOSIS — E119 Type 2 diabetes mellitus without complications: Secondary | ICD-10-CM

## 2018-06-19 MED ORDER — METFORMIN HCL 500 MG PO TABS: 500.0000 mg | ORAL_TABLET | Freq: Every day | ORAL | 0 refills | Status: DC

## 2018-06-19 MED ORDER — LISINOPRIL 2.5 MG PO TABS: 2.5000 mg | ORAL_TABLET | Freq: Every day | ORAL | 0 refills | Status: DC

## 2018-06-19 MED ORDER — ATORVASTATIN CALCIUM 10 MG PO TABS: 10.0000 mg | ORAL_TABLET | Freq: Every day | ORAL | 0 refills | Status: DC

## 2018-06-19 MED FILL — metFORMIN HCL 500 MG TABS: 500 | 30 days supply | Qty: 30 | Fill #0

## 2018-06-19 NOTE — Procedures (Signed)
EMG & NCV Findings: Evaluation of the right median motor nerve showed prolonged distal onset latency (4.9 ms) and decreased conduction velocity (Elbow-Wrist, 46 m/s).  The right median (across palm) sensory nerve showed no response (Palm), prolonged distal peak latency (5.5 ms), and reduced amplitude (9.0 V).  All remaining nerves (as indicated in the following tables) were within normal limits.    All examined muscles (as indicated in the following table) showed no evidence of electrical instability.    Impression: The above electrodiagnostic study is ABNORMAL and reveals evidence of a moderate right median nerve entrapment at the wrist (carpal tunnel syndrome) affecting sensory and motor components.  There is also incidental finding of a Martin-Gruber anastomosis which is a normal variant.   There is no significant electrodiagnostic evidence of any other focal nerve entrapment, brachial plexopathy or cervical radiculopathy.  Recommendations: 1.  Follow-up with referring physician. 2.  Continue current management of symptoms. 3.  Continue use of resting splint at night-time and as needed during the day. 4.  Suggest surgical evaluation.  ___________________________ Laurence Spates FAAPMR Board Certified, American Board of Physical Medicine and Rehabilitation    Nerve Conduction Studies Anti Sensory Summary Table   Stim Site NR Peak (ms) Norm Peak (ms) P-T Amp (V) Norm P-T Amp Site1 Site2 Delta-P (ms) Dist (cm) Vel (m/s) Norm Vel (m/s)  Right Median Acr Palm Anti Sensory (2nd Digit)  32.9C  Wrist    *5.5 <3.6 *9.0 >10 Wrist Palm  0.0    Palm *NR  <2.0          Right Radial Anti Sensory (Base 1st Digit)  32.9C  Wrist    1.8 <3.1 18.2  Wrist Base 1st Digit 1.8 0.0    Right Ulnar Anti Sensory (5th Digit)  33C  Wrist    2.8 <3.7 19.7 >15.0 Wrist 5th Digit 2.8 14.0 50 >38   Motor Summary Table   Stim Site NR Onset (ms) Norm Onset (ms) O-P Amp (mV) Norm O-P Amp Site1 Site2 Delta-0 (ms)  Dist (cm) Vel (m/s) Norm Vel (m/s)  Right Median Motor (Abd Poll Brev)  32.8C  Wrist    *4.9 <4.2 5.4 >5 Elbow Wrist 5.0 23.0 *46 >50  Elbow    9.9  2.4         Right Ulnar Motor (Abd Dig Min)  32.6C  Wrist    2.9 <4.2 8.4 >3 B Elbow Wrist 3.4 20.0 59 >53  B Elbow    6.3  5.3  A Elbow B Elbow 1.7 12.0 71 >53  A Elbow    8.0  6.2          EMG   Side Muscle Nerve Root Ins Act Fibs Psw Amp Dur Poly Recrt Int Fraser Din Comment  Right Abd Poll Brev Median C8-T1 Nml Nml Nml Nml Nml 0 Nml Nml   Right 1stDorInt Ulnar C8-T1 Nml Nml Nml Nml Nml 0 Nml Nml   Right PronatorTeres Median C6-7 Nml Nml Nml Nml Nml 0 Nml Nml   Right Biceps Musculocut C5-6 Nml Nml Nml Nml Nml 0 Nml Nml   Right Deltoid Axillary C5-6 Nml Nml Nml Nml Nml 0 Nml Nml     Nerve Conduction Studies Anti Sensory Left/Right Comparison   Stim Site L Lat (ms) R Lat (ms) L-R Lat (ms) L Amp (V) R Amp (V) L-R Amp (%) Site1 Site2 L Vel (m/s) R Vel (m/s) L-R Vel (m/s)  Median Acr Palm Anti Sensory (2nd Digit)  32.9C  Wrist  *  5.5   *9.0  Wrist Palm     Palm             Radial Anti Sensory (Base 1st Digit)  32.9C  Wrist  1.8   18.2  Wrist Base 1st Digit     Ulnar Anti Sensory (5th Digit)  33C  Wrist  2.8   19.7  Wrist 5th Digit  50    Motor Left/Right Comparison   Stim Site L Lat (ms) R Lat (ms) L-R Lat (ms) L Amp (mV) R Amp (mV) L-R Amp (%) Site1 Site2 L Vel (m/s) R Vel (m/s) L-R Vel (m/s)  Median Motor (Abd Poll Brev)  32.8C  Wrist  *4.9   5.4  Elbow Wrist  *46   Elbow  9.9   2.4        Ulnar Motor (Abd Dig Min)  32.6C  Wrist  2.9   8.4  B Elbow Wrist  59   B Elbow  6.3   5.3  A Elbow B Elbow  71   A Elbow  8.0   6.2           Waveforms:

## 2018-06-19 NOTE — Progress Notes (Signed)
Office: [phone]  /  Fax: [phone]   HPI:   Chief Complaint: OBESITY Andrea Montes is here to discuss her progress with her obesity treatment plan. She is on the Category 3 plan and is following her eating plan approximately 65 % of the time. She states she is walking 45 minutes 3 times per week. Andrea Montes has maintained her weight despite [employer] homecoming [employer]gating. She is eating all of her food on her plan. She wants more food choices and she has cut out soda.  Her weight is 264 lb (119.7 kg) today and has not lost weight since her last visit. She has lost 6 lbs since starting treatment with [name].  Diabetes II Andrea Montes has a diagnosis of diabetes type II. Andrea Montes denies polyphagia, nausea, vomiting, diarrhea, hypoglycemia, cough, or myalgias. Her last A1c is stable and at goal at 6.7 on 05/07/18. She has been working on intensive lifestyle modifications including diet, exercise, and weight loss to help control her blood glucose levels.  Vitamin D deficiency Andrea Montes has a diagnosis of vitamin D deficiency. She is currently taking vit D and is stable, but not at goal. She denies nausea, vomiting or muscle weakness.  At risk for osteopenia and osteoporosis Andrea Montes is at higher risk of osteopenia and osteoporosis due to vitamin D deficiency.   ALLERGIES: No Known Allergies  MEDICATIONS: Current Outpatient Medications on File Prior to Visit  Medication Sig Dispense Refill  . bacitracin 500 UNIT/GM ointment Apply 1 application topically 2 (two) times daily. 15 g 0  . furosemide (LASIX) 20 MG tablet Take 1 tablet (20 mg total) by mouth daily. (Patient taking differently: Take 20 mg by mouth daily as needed for fluid. ) 30 tablet 11  . meloxicam (MOBIC) 15 MG tablet Take 1 tablet (15 mg total) by mouth daily as needed for pain. 60 tablet 1  . montelukast (SINGULAIR) 10 MG tablet Take 1 tablet (10 mg total) by mouth at bedtime. 30 tablet 0  . phenazopyridine (PYRIDIUM) 100 MG tablet Take 1 tablet  (100 mg total) by mouth 3 (three) times daily as needed for pain. 10 tablet 0  . PROAIR HFA 108 (90 Base) MCG/ACT inhaler INHALE TWO PUFFS BY MOUTH EVERY 4 TO 6 HOURS AS NEEDED FOR SHORTNESS OF BREATH  0  . SUMAtriptan (IMITREX) 25 MG tablet Take 25 mg by mouth every 2 (two) hours as needed for migraine or headache.   1  . traMADol (ULTRAM) 50 MG tablet Take 1 tablet (50 mg total) by mouth every 6 (six) hours as needed. (Patient taking differently: Take 50 mg by mouth every 6 (six) hours as needed (for pain.). ) 60 tablet 2  . Vitamin D, Ergocalciferol, (DRISDOL) 50000 units CAPS capsule Take 1 capsule (50,000 Units total) by mouth every 7 (seven) days. 4 capsule 0  . albuterol (PROVENTIL HFA;VENTOLIN HFA) 108 (90 Base) MCG/ACT inhaler Inhale 2 puffs into the lungs every 6 (six) hours as needed for up to 10 days for wheezing or shortness of breath. 1 Inhaler 0  . fluticasone (FLONASE) 50 MCG/ACT nasal spray Place 2 sprays into both nostrils daily for 10 days. 16 g 0   Current Facility-Administered Medications on File Prior to Visit  Medication Dose Route Frequency Provider Last Rate Last Dose  . 0.9 %  sodium chloride infusion  500 mL Intravenous Continuous Nandigam, Venia Minks, MD        PAST MEDICAL HISTORY: Past Medical History:  Diagnosis Date  . Anemia    no  current med.  [name] Apnea   . 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 Back pain   . Carpal tunnel syndrome   . Complication of anesthesia    itching from last surgery that was completed at the surgery center off of [address] patient was given benedryl, generalized itching and they are not sure where that came from  . DDD (degenerative disc disease), lumbar   . Dental crowns present   . Edema of lower extremity    bilateral  . Frozen shoulder    surgery 11-04-14  . GERD (gastroesophageal reflux disease)    no current med.  [name] GERD (gastroesophageal reflux disease)   . History of blood transfusion   . History  of shingles   . Hypertension    borderline not on medicaiton at this time  . Impingement syndrome of left shoulder region 10/2014  . Joint pain   . Migraines   . Obesity   . Rheumatoid arteritis ([place])   . Sleep apnea    uses CPAP nightly  . Trigger finger, left middle finger   . Vitamin D deficiency   . Wears partial dentures    upper    PAST SURGICAL HISTORY: Past Surgical History:  Procedure Laterality Date  . CARPAL TUNNEL RELEASE Left 04/25/2006  . CLOSED MANIPULATION SHOULDER WITH STERIOD INJECTION Left 02/03/2015   Procedure: LEFT  MANIPULATION SHOULDER UNDER ANESTHESIA WITH INJECTION OF STEROID;  Surgeon: Kathryne Hitch, MD;  Location: [place];  Service: Orthopedics;  Laterality: Left;  . COLONOSCOPY    . COLONOSCOPY WITH PROPOFOL  10/09/2011  . POLYPECTOMY    . RESECTION DISTAL CLAVICAL Left 11/04/2014   Procedure: RESECTION DISTAL CLAVICAL;  Surgeon: Kathryne Hitch, MD;  Location: [place];  Service: Orthopedics;  Laterality: Left;  . ROTATOR CUFF REPAIR  2010  . SHOULDER ARTHROSCOPY WITH ROTATOR CUFF REPAIR Right 10/04/2008  . SHOULDER ARTHROSCOPY WITH SUBACROMIAL DECOMPRESSION, ROTATOR CUFF REPAIR AND BICEP TENDON REPAIR Left 11/04/2014   Procedure: LEFT SHOULDER ARTHROSCOPY WITH DEBRIDEMENT, DISTAL CLAVICLE EXCISION, ACROMIOPLASTY, ROTATOR CUFF REPAIR AND BICEP TENODESIS;  Surgeon: Kathryne Hitch, MD;  Location: [place];  Service: Orthopedics;  Laterality: Left;  . TRIGGER FINGER RELEASE Right 02/03/2015   Procedure: RIGHT LONG FINGER TRIGGER RELEASE;  Surgeon: Kathryne Hitch, MD;  Location: [place] [facility];  Service: Orthopedics;  Laterality: Right;  . TRIGGER FINGER RELEASE Left 10/10/2017   Procedure: LEFT HAND RELEASE TRIGGER FINGER 3RD LONG FINGER AND CYST EXCISION;  Surgeon: Meredith Pel, MD;  Location: [place];  Service: Orthopedics;  Laterality: Left;  [name] VAGINAL HYSTERECTOMY  2001   partial    SOCIAL  HISTORY: Social History   Tobacco Use  . Smoking status: Never Smoker  . Smokeless tobacco: Never Used  Substance Use Topics  . Alcohol use: No  . Drug use: No    FAMILY HISTORY: Family History  Problem Relation Age of Onset  . Kidney disease Brother   . Hypertension Brother   . Leukemia Mother   . Heart disease Mother   . Stroke Mother   . Cancer Mother   . Depression Mother   . Anxiety disorder Mother   . AAA (abdominal aortic aneurysm) Father   . Esophageal cancer Maternal Uncle   . Hypertension Brother   . Colon cancer Neg Hx   . Colon polyps Neg Hx   . Rectal cancer Neg Hx   . Stomach cancer Neg Hx  ROS: Review of Systems  Constitutional: Negative for weight loss.  Respiratory: Negative for cough.   Gastrointestinal: Positive for diarrhea. Negative for nausea and vomiting.  Musculoskeletal: Negative for myalgias.       Negative for muscle weakness.  Endo/Heme/Allergies:       Negative for hypoglycemia. Negative fir polyphagia.    PHYSICAL EXAM: Blood pressure 130/75, pulse (!) 58, temperature 98 F (36.7 C), temperature source Oral, height 5\' 3"  (1.6 m), weight 264 lb (119.7 kg), SpO2 97 %. Body mass index is 46.77 kg/m. Physical Exam  Constitutional: She is oriented to person, place, and time. She appears well-developed and well-nourished.  Cardiovascular: Normal rate.  Pulmonary/Chest: Effort normal.  Musculoskeletal: Normal range of motion.  Neurological: She is oriented to person, place, and time.  Skin: Skin is warm and dry.  Psychiatric: She has a normal mood and affect. Her behavior is normal.  Vitals reviewed.   RECENT LABS AND TESTS: BMET    Component Value Date/Time   NA 139 05/07/2018 1424   K 3.9 05/07/2018 1424   CL 100 05/07/2018 1424   CO2 25 05/07/2018 1424   GLUCOSE 99 05/07/2018 1424   GLUCOSE 136 (H) 10/08/2017 0841   BUN 10 05/07/2018 1424   CREATININE 0.82 05/07/2018 1424   CREATININE 0.85 03/07/2016 1654   CALCIUM  8.7 05/07/2018 1424   GFRNONAA 78 05/07/2018 1424   GFRAA 90 05/07/2018 1424   Lab Results  Component Value Date   HGBA1C 6.7 (H) 05/07/2018   Lab Results  Component Value Date   INSULIN 39.8 (H) 05/07/2018   CBC    Component Value Date/Time   WBC 6.5 05/07/2018 1424   WBC 16.9 (H) 10/08/2017 0841   RBC 5.34 (H) 05/07/2018 1424   RBC 5.75 (H) 10/08/2017 0841   HGB 11.8 05/07/2018 1424   HCT 36.9 05/07/2018 1424   PLT 233 10/08/2017 0841   MCV 69 (L) 05/07/2018 1424   MCH 22.1 (L) 05/07/2018 1424   MCH 22.1 (L) 10/08/2017 0841   MCHC 32.0 05/07/2018 1424   MCHC 31.4 10/08/2017 0841   RDW 16.4 (H) 05/07/2018 1424   LYMPHSABS 2.9 05/07/2018 1424   MONOABS 0.5 07/14/2017 2115   EOSABS 0.1 05/07/2018 1424   BASOSABS 0.0 05/07/2018 1424   Iron/TIBC/Ferritin/ %Sat No results found for: IRON, TIBC, FERRITIN, IRONPCTSAT Lipid Panel     Component Value Date/Time   CHOL 130 05/07/2018 1424   TRIG 89 05/07/2018 1424   HDL 35 (L) 05/07/2018 1424   CHOLHDL 4.1 Ratio 09/26/2009 1823   VLDL 26 09/26/2009 1823   LDLCALC 77 05/07/2018 1424   Hepatic Function Panel     Component Value Date/Time   PROT 6.9 05/07/2018 1424   ALBUMIN 3.7 05/07/2018 1424   AST 17 05/07/2018 1424   ALT 6 05/07/2018 1424   ALKPHOS 93 05/07/2018 1424   BILITOT 0.2 05/07/2018 1424      Component Value Date/Time   TSH 1.470 05/07/2018 1424   TSH 1.55 11/25/2008 1700   Results for Andrea Montes, Andrea Montes (MRN [id number]) as of 06/19/2018 15:01  Ref. Range 05/07/2018 14:24  Vitamin D, 25-Hydroxy Latest Ref Range: 30.0 - 100.0 ng/mL 14.4 (L)   ASSESSMENT AND PLAN: Type 2 diabetes mellitus without complication, without long-term current use of insulin (HCC) - Plan: metFORMIN (GLUCOPHAGE) 500 MG tablet, lisinopril (ZESTRIL) 2.5 MG tablet, atorvastatin (LIPITOR) 10 MG tablet  Vitamin D deficiency  At risk for osteoporosis  Class 3 severe obesity with serious comorbidity  and body mass index (BMI) of 45.0  to 49.9 in adult, unspecified obesity type ([place])  PLAN:  Diabetes II Andrea Montes has been given extensive diabetes education by myself today including ideal fasting and post-prandial blood glucose readings, individual ideal Hgb A1c goals, and hypoglycemia prevention. We discussed the importance of good blood sugar control to decrease the likelihood of diabetic complications such as nephropathy, neuropathy, limb loss, blindness, coronary artery disease, and death. We discussed the importance of intensive lifestyle modification including diet, exercise and weight loss as the first line treatment for diabetes. Schwanda agrees to continue her metformin 500mg  qAM #30 with no refills, lisinopril 2.5mg  qd #30 with no refills, atorvastatin 10mg  qd #30 with no refills. She will follow up at the agreed upon time in 2 weeks.  Vitamin D Deficiency Andrea Montes was informed that low vitamin D levels contributes to fatigue and are associated with obesity, breast, and colon cancer. She agrees to continue to take prescription Vit D @50 ,000 IU every week with no refills needed and will follow up for routine testing of vitamin D, at least 2-3 times per year. She was informed of the risk of over-replacement of vitamin D and agrees to not increase her dose unless she discusses this with [name] first. Andrea Montes agrees to follow up in 2 weeks.  At risk for osteopenia and osteoporosis Andrea Montes was given extended (15 minutes) osteoporosis prevention counseling today. Andrea Montes is at risk for osteopenia and osteoporosis due to her vitamin D deficiency. She was encouraged to take her vitamin D and follow her higher calcium diet and increase strengthening exercise to help strengthen her bones and decrease her risk of osteopenia and osteoporosis.  Obesity Andrea Montes is currently in the action stage of change. As such, her goal is to continue with weight loss efforts She has agreed to follow the Category 3 plan and keeping a food journal of 350 to 400  calories and 35+ grams of protein for lunch. Andrea Montes has been instructed to continue walking 3 times per week.  We discussed the following Behavioral Modification Strategies today: work on meal planning and easy cooking plans, planning for success, and keep a strict food journal.  Andrea Montes has agreed to follow up with our clinic in 2 weeks. She was informed of the importance of frequent follow up visits to maximize her success with intensive lifestyle modifications for her multiple health conditions.   OBESITY BEHAVIORAL INTERVENTION VISIT  Today's visit was # 4   Starting weight: 270 lbs Starting date: 05/07/18 Today's weight : Weight: 264 lb (119.7 kg)  Today's date: 06/19/2018 Total lbs lost to date: 6  ASK: We discussed the diagnosis of obesity with Andrea Montes today and Andrea Montes agreed to give [name] permission to discuss obesity behavioral modification therapy today.  ASSESS: Andrea Montes has the diagnosis of obesity and her BMI today is 46.78. Andrea Montes is in the action stage of change.   ADVISE: [place] was educated on the multiple health risks of obesity as well as the benefit of weight loss to improve her health. She was advised of the need for long term treatment and the importance of lifestyle modifications to improve her current health and to decrease her risk of future health problems.  AGREE: Multiple dietary modification options and treatment options were discussed and Andrea Montes agreed to follow the recommendations documented in the above note.  ARRANGE: Andrea Montes was educated on the importance of frequent visits to treat obesity as outlined per CMS and USPSTF guidelines and agreed to schedule  her next follow up appointment today.  Lenward Chancellor, am acting as Location manager for Georgianne Fick, FNP.  I have reviewed the above documentation for accuracy and completeness, and I agree with the above.  -  , FNP-C.

## 2018-06-19 NOTE — Progress Notes (Signed)
Andrea Montes - 62 y.o. female MRN [id number]  Date of birth: 08/31/1955  Office Visit Note: Visit Date: 06/18/2018 PCP: Seward Carol, MD Referred by: Seward Carol, MD  Subjective: Chief Complaint  Patient presents with  . Right Arm - Numbness, Pain   HPI: Andrea Montes is a 62 y.o. female who comes in today At the request of G. Alphonzo Severance for electrodiagnostic study of the right upper limb.  She is right-hand dominant and describes throbbing and aching pain with numbness and tingling really in a nondermatomal distribution of the whole hand.  She reports pain with writing she does wake up at night with pain and numbness.  She reports about a month to 2 months of just worsening symptoms but off-and-on pain in the hand.  She is status post left carpal tunnel release in 2007.  She does not recall prior electrodiagnostic study.  She does report some relief with the surgery.  She has had trigger finger release on both hands.  Dr. Marlou Sa performed the most recent trigger finger release and she had one by Dr. Kathryne Hitch.  She rates her pain as an 8 out of 10.  She denies any frank radicular symptoms.  She does use her hands a lot with crafting and she is a Geophysicist/field seismologist.  ROS Otherwise per HPI.  Assessment & Plan: Visit Diagnoses:  1. Paresthesia of skin     Plan:    MedsImpression: The above electrodiagnostic study is ABNORMAL and reveals evidence of a moderate right median nerve entrapment at the wrist (carpal tunnel syndrome) affecting sensory and motor components.  There is also incidental finding of a Martin-Gruber anastomosis which is a normal variant.   There is no significant electrodiagnostic evidence of any other focal nerve entrapment, brachial plexopathy or cervical radiculopathy.  Recommendations: 1.  Follow-up with referring physician. 2.  Continue current management of symptoms. 3.  Continue use of resting splint at night-time and as needed during the day. 4.   Suggest surgical evaluation.  & Orders: No orders of the defined types were placed in this encounter.   Orders Placed This Encounter  Procedures  . NCV with EMG (electromyography)    Follow-up: Return for  Anderson Malta, M.D..   Procedures: No procedures performed  EMG & NCV Findings: Evaluation of the right median motor nerve showed prolonged distal onset latency (4.9 ms) and decreased conduction velocity (Elbow-Wrist, 46 m/s).  The right median (across palm) sensory nerve showed no response (Palm), prolonged distal peak latency (5.5 ms), and reduced amplitude (9.0 V).  All remaining nerves (as indicated in the following tables) were within normal limits.    All examined muscles (as indicated in the following table) showed no evidence of electrical instability.    Impression: The above electrodiagnostic study is ABNORMAL and reveals evidence of a moderate right median nerve entrapment at the wrist (carpal tunnel syndrome) affecting sensory and motor components.  There is also incidental finding of a Martin-Gruber anastomosis which is a normal variant.   There is no significant electrodiagnostic evidence of any other focal nerve entrapment, brachial plexopathy or cervical radiculopathy.  Recommendations: 1.  Follow-up with referring physician. 2.  Continue current management of symptoms. 3.  Continue use of resting splint at night-time and as needed during the day. 4.  Suggest surgical evaluation.  ___________________________ Wonda Olds Board Certified, American Board of Physical Medicine and Rehabilitation    Nerve Conduction Studies Anti Sensory Summary Table   Stim Site  NR Peak (ms) Norm Peak (ms) P-T Amp (V) Norm P-T Amp Site1 Site2 Delta-P (ms) Dist (cm) Vel (m/s) Norm Vel (m/s)  Right Median Acr Palm Anti Sensory (2nd Digit)  32.9C  Wrist    *5.5 <3.6 *9.0 >10 Wrist Palm  0.0    Palm *NR  <2.0          Right Radial Anti Sensory (Base 1st Digit)  32.9C    Wrist    1.8 <3.1 18.2  Wrist Base 1st Digit 1.8 0.0    Right Ulnar Anti Sensory (5th Digit)  33C  Wrist    2.8 <3.7 19.7 >15.0 Wrist 5th Digit 2.8 14.0 50 >38   Motor Summary Table   Stim Site NR Onset (ms) Norm Onset (ms) O-P Amp (mV) Norm O-P Amp Site1 Site2 Delta-0 (ms) Dist (cm) Vel (m/s) Norm Vel (m/s)  Right Median Motor (Abd Poll Brev)  32.8C  Wrist    *4.9 <4.2 5.4 >5 Elbow Wrist 5.0 23.0 *46 >50  Elbow    9.9  2.4         Right Ulnar Motor (Abd Dig Min)  32.6C  Wrist    2.9 <4.2 8.4 >3 B Elbow Wrist 3.4 20.0 59 >53  B Elbow    6.3  5.3  A Elbow B Elbow 1.7 12.0 71 >53  A Elbow    8.0  6.2          EMG   Side Muscle Nerve Root Ins Act Fibs Psw Amp Dur Poly Recrt Int Fraser Din Comment  Right Abd Poll Brev Median C8-T1 Nml Nml Nml Nml Nml 0 Nml Nml   Right 1stDorInt Ulnar C8-T1 Nml Nml Nml Nml Nml 0 Nml Nml   Right PronatorTeres Median C6-7 Nml Nml Nml Nml Nml 0 Nml Nml   Right Biceps Musculocut C5-6 Nml Nml Nml Nml Nml 0 Nml Nml   Right Deltoid Axillary C5-6 Nml Nml Nml Nml Nml 0 Nml Nml     Nerve Conduction Studies Anti Sensory Left/Right Comparison   Stim Site L Lat (ms) R Lat (ms) L-R Lat (ms) L Amp (V) R Amp (V) L-R Amp (%) Site1 Site2 L Vel (m/s) R Vel (m/s) L-R Vel (m/s)  Median Acr Palm Anti Sensory (2nd Digit)  32.9C  Wrist  *5.5   *9.0  Wrist Palm     Palm             Radial Anti Sensory (Base 1st Digit)  32.9C  Wrist  1.8   18.2  Wrist Base 1st Digit     Ulnar Anti Sensory (5th Digit)  33C  Wrist  2.8   19.7  Wrist 5th Digit  50    Motor Left/Right Comparison   Stim Site L Lat (ms) R Lat (ms) L-R Lat (ms) L Amp (mV) R Amp (mV) L-R Amp (%) Site1 Site2 L Vel (m/s) R Vel (m/s) L-R Vel (m/s)  Median Motor (Abd Poll Brev)  32.8C  Wrist  *4.9   5.4  Elbow Wrist  *46   Elbow  9.9   2.4        Ulnar Motor (Abd Dig Min)  32.6C  Wrist  2.9   8.4  B Elbow Wrist  59   B Elbow  6.3   5.3  A Elbow B Elbow  71   A Elbow  8.0   6.2           Waveforms:  Clinical History: MRI OF THE RIGHT SHOULDER WITHOUT CONTRAST  TECHNIQUE: Multiplanar, multisequence MR imaging of the shoulder was performed. No intravenous contrast was administered.  COMPARISON:  Right shoulder x-rays dated April 15, 2017. Right shoulder MRI dated September 16, 2008.  FINDINGS: Rotator cuff: Evidence of prior rotator cuff repair with several suture anchors in the greater tuberosity. There is bursal and articular surface tearing of the distal supraspinatus tendon involving approximately 50-75% of the tendon thickness. The infraspinatus, teres minor, and subscapularis tendons are intact. Mild subscapularis tendinosis.  Muscles:  Mild fatty infiltration of the infraspinatus muscle.  Biceps long head: Mild tendinosis of the intra-articular portion. Mild tenosynovitis. No dislocation.  Acromioclavicular Joint: Mild arthropathy of the acromioclavicular joint. Type II acromion. Trace subacromial/subdeltoid bursal fluid.  Glenohumeral Joint: Diffuse cartilage thinning with focal full-thickness defect along the anterior inferior glenoid, with underlying subchondral cystic change, progressed when compared to prior study. No joint effusion.  Labrum:  Prior labral debridement.  No definite recurrent tear.  Bones:  No fracture or dislocation.  Other: None.  IMPRESSION: 1. Evidence of prior rotator cuff repair with new bursal and articular surface tearing of the distal supraspinatus tendon involving approximately 50-75% of the tendon thickness in total. 2. Mild biceps tendinosis and tenosynovitis. 3. Progressed moderate glenohumeral degenerative changes with full-thickness cartilage loss along the glenoid and underlying subchondral cystic change. 4. Mild degenerative changes of the acromioclavicular joint.   Electronically Signed   By: Titus Dubin M.D.   On: 06/01/2017 11:27   She reports that she has never smoked. She has never  used smokeless tobacco.  Recent Labs    05/07/18 1424  HGBA1C 6.7*    Objective:  VS:  HT:    WT:   BMI:     BP:   HR: bpm  TEMP: ( )  RESP:  Physical Exam  Constitutional: She is oriented to person, place, and time.  Musculoskeletal:  Inspection reveals no atrophy of the bilateral APB or FDI or hand intrinsics. There is no swelling, color changes, allodynia or dystrophic changes. There is 5 out of 5 strength in the bilateral wrist extension, finger abduction and long finger flexion. There is a negative Hoffmann's test bilaterally.  Neurological: She is alert and oriented to person, place, and time. She exhibits normal muscle tone. Coordination normal.  Skin: Skin is warm and dry. No erythema.    Ortho Exam Imaging: No results found.  Past Medical/Family/Surgical/Social History: Medications & Allergies reviewed per EMR, new medications updated. Patient Active Problem List   Diagnosis Date Noted  . Trigger finger, acquired 09/09/2015  . Low back pain radiating to both legs 05/06/2012  . Leg length discrepancy 05/06/2012  . HYPERTENSION, BENIGN 04/11/2009  . LEG EDEMA, BILATERAL 10/08/2008  . 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 OVARIAN FAILURE 09/02/2007  . VITAMIN D DEFICIENCY 09/02/2007  . OBESITY, NOS 10/17/2006  . ANEMIA, IRON DEFICIENCY, UNSPEC. 10/17/2006  . CARPAL TUNNEL SYNDROME 10/17/2006   Past Medical History:  Diagnosis Date  . Anemia    no current med.  [name] Apnea   . 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 Back pain   . Carpal tunnel syndrome   . Complication of anesthesia    itching from last surgery that was completed at the surgery center off of [address] patient was given benedryl, generalized itching and they are not sure where that came from  . DDD (degenerative disc disease), lumbar   .  Dental crowns present   . Edema of lower extremity    bilateral  . Frozen shoulder    surgery 11-04-14  . GERD  (gastroesophageal reflux disease)    no current med.  [name] GERD (gastroesophageal reflux disease)   . History of blood transfusion   . History of shingles   . Hypertension    borderline not on medicaiton at this time  . Impingement syndrome of left shoulder region 10/2014  . Joint pain   . Migraines   . Obesity   . Rheumatoid arteritis ([place])   . Sleep apnea    uses CPAP nightly  . Trigger finger, left middle finger   . Vitamin D deficiency   . Wears partial dentures    upper   Family History  Problem Relation Age of Onset  . Kidney disease Brother   . Hypertension Brother   . Leukemia Mother   . Heart disease Mother   . Stroke Mother   . Cancer Mother   . Depression Mother   . Anxiety disorder Mother   . AAA (abdominal aortic aneurysm) Father   . Esophageal cancer Maternal Uncle   . Hypertension Brother   . Colon cancer Neg Hx   . Colon polyps Neg Hx   . Rectal cancer Neg Hx   . Stomach cancer Neg Hx    Past Surgical History:  Procedure Laterality Date  . CARPAL TUNNEL RELEASE Left 04/25/2006  . CLOSED MANIPULATION SHOULDER WITH STERIOD INJECTION Left 02/03/2015   Procedure: LEFT  MANIPULATION SHOULDER UNDER ANESTHESIA WITH INJECTION OF STEROID;  Surgeon: Kathryne Hitch, MD;  Location: [facility];  Service: Orthopedics;  Laterality: Left;  . COLONOSCOPY    . COLONOSCOPY WITH PROPOFOL  10/09/2011  . POLYPECTOMY    . RESECTION DISTAL CLAVICAL Left 11/04/2014   Procedure: RESECTION DISTAL CLAVICAL;  Surgeon: Kathryne Hitch, MD;  Location: [facility];  Service: Orthopedics;  Laterality: Left;  . ROTATOR CUFF REPAIR  2010  . SHOULDER ARTHROSCOPY WITH ROTATOR CUFF REPAIR Right 10/04/2008  . SHOULDER ARTHROSCOPY WITH SUBACROMIAL DECOMPRESSION, ROTATOR CUFF REPAIR AND BICEP TENDON REPAIR Left 11/04/2014   Procedure: LEFT SHOULDER ARTHROSCOPY WITH DEBRIDEMENT, DISTAL CLAVICLE EXCISION, ACROMIOPLASTY, ROTATOR CUFF REPAIR AND BICEP TENODESIS;  Surgeon: Kathryne Hitch, MD;  Location: [facility];  Service: Orthopedics;  Laterality: Left;  . TRIGGER FINGER RELEASE Right 02/03/2015   Procedure: RIGHT LONG FINGER TRIGGER RELEASE;  Surgeon: Kathryne Hitch, MD;  Location: [facility];  Service: Orthopedics;  Laterality: Right;  . TRIGGER FINGER RELEASE Left 10/10/2017   Procedure: LEFT HAND RELEASE TRIGGER FINGER 3RD LONG FINGER AND CYST EXCISION;  Surgeon: Meredith Pel, MD;  Location: [facility];  Service: Orthopedics;  Laterality: Left;  [name] VAGINAL HYSTERECTOMY  2001   partial   Social History   Occupational History  . Not on file  Tobacco Use  . Smoking status: Never Smoker  . Smokeless tobacco: Never Used  Substance and Sexual Activity  . Alcohol use: No  . Drug use: No  . Sexual activity: Not on file

## 2018-07-03 ENCOUNTER — Encounter (INDEPENDENT_AMBULATORY_CARE_PROVIDER_SITE_OTHER): Payer: Self-pay | Admitting: Family Medicine

## 2018-07-03 ENCOUNTER — Ambulatory Visit (INDEPENDENT_AMBULATORY_CARE_PROVIDER_SITE_OTHER): Payer: 59 | Admitting: Orthopedic Surgery

## 2018-07-03 ENCOUNTER — Ambulatory Visit (INDEPENDENT_AMBULATORY_CARE_PROVIDER_SITE_OTHER): Payer: 59 | Admitting: Family Medicine

## 2018-07-03 ENCOUNTER — Encounter (INDEPENDENT_AMBULATORY_CARE_PROVIDER_SITE_OTHER): Payer: Self-pay | Admitting: Orthopedic Surgery

## 2018-07-03 ENCOUNTER — Ambulatory Visit: Payer: Self-pay | Admitting: Family Medicine

## 2018-07-03 VITALS — BP 130/82 | Temp 98.7°F | Wt 260.0 lb

## 2018-07-03 VITALS — BP 119/71 | HR 59 | Temp 97.8°F | Ht 63.0 in | Wt 260.0 lb

## 2018-07-03 DIAGNOSIS — E559 Vitamin D deficiency, unspecified: Secondary | ICD-10-CM

## 2018-07-03 DIAGNOSIS — G5601 Carpal tunnel syndrome, right upper limb: Secondary | ICD-10-CM | POA: Diagnosis not present

## 2018-07-03 DIAGNOSIS — N390 Urinary tract infection, site not specified: Secondary | ICD-10-CM

## 2018-07-03 DIAGNOSIS — Z6841 Body Mass Index (BMI) 40.0 and over, adult: Secondary | ICD-10-CM

## 2018-07-03 DIAGNOSIS — E119 Type 2 diabetes mellitus without complications: Secondary | ICD-10-CM | POA: Diagnosis not present

## 2018-07-03 DIAGNOSIS — Z9189 Other specified personal risk factors, not elsewhere classified: Secondary | ICD-10-CM

## 2018-07-03 LAB — POCT URINALYSIS DIPSTICK
BILIRUBIN UA: NEGATIVE
Blood, UA: NEGATIVE
GLUCOSE UA: NEGATIVE
KETONES UA: NEGATIVE
Nitrite, UA: NEGATIVE
PH UA: 5.5 (ref 5.0–8.0)
Protein, UA: NEGATIVE
Spec Grav, UA: 1.01 (ref 1.010–1.025)
UROBILINOGEN UA: 0.2 U/dL

## 2018-07-03 MED ORDER — ATORVASTATIN CALCIUM 10 MG PO TABS: 10.0000 mg | ORAL_TABLET | Freq: Every day | ORAL | 0 refills | Status: DC

## 2018-07-03 MED ORDER — NITROFURANTOIN MONOHYD MACRO 100 MG PO CAPS: 100.0000 mg | ORAL_CAPSULE | Freq: Two times a day (BID) | ORAL | 0 refills | Status: AC

## 2018-07-03 MED FILL — ATORVASTATIN 10 MG TABLET: 10 | 30 days supply | Qty: 30 | Fill #0

## 2018-07-03 NOTE — Progress Notes (Signed)
Andrea Montes is a 62 y.o. female who presents today with concerns of increased urination and slight burring with urination for the past 3 days. She has some reported history of UTI's frequently a few a year. She is currently pending a surgery. She has had one other documented UTI this year that was over 6 months ago and was treated successfully with oral antibiotics. She has not attempted to treat this condition with any medications up to this point and denies risk for STI/STD. She denies systemic symptoms of fever but does report lower back pain.  Review of Systems  Constitutional: Negative for chills, fever and malaise/fatigue.  HENT: Negative for congestion, ear discharge, ear pain, sinus pain and sore throat.   Eyes: Negative.   Respiratory: Negative for cough, sputum production and shortness of breath.   Cardiovascular: Negative.  Negative for chest pain.  Gastrointestinal: Negative for abdominal pain, diarrhea, nausea and vomiting.  Genitourinary: Positive for dysuria, frequency and urgency. Negative for hematuria.  Musculoskeletal: Negative for myalgias.  Skin: Negative.   Neurological: Negative for headaches.  Endo/Heme/Allergies: Negative.   Psychiatric/Behavioral: Negative.     O: Vitals:   07/03/18 1736  BP: 130/82  Temp: 98.7 F (37.1 C)  SpO2: 100%     Physical Exam  Constitutional: She is oriented to person, place, and time. Vital signs are normal. She appears well-developed and well-nourished. She is active.  Non-toxic appearance. She does not have a sickly appearance.  HENT:  Head: Normocephalic.  Right Ear: Hearing, tympanic membrane, external ear and ear canal normal.  Left Ear: Hearing, tympanic membrane, external ear and ear canal normal.  Nose: Nose normal.  Mouth/Throat: Uvula is midline and oropharynx is clear and moist.  Neck: Normal range of motion. Neck supple.  Cardiovascular: Normal rate, regular rhythm, normal heart sounds and normal pulses.   Pulmonary/Chest: Effort normal and breath sounds normal.  Abdominal: Soft. Bowel sounds are normal. There is tenderness. There is CVA tenderness. There is no rigidity, no rebound and no guarding.  Left sided CVA tenderness only Abdominal exam otherwise unremarkable  Musculoskeletal: Normal range of motion.  Lymphadenopathy:       Head (right side): No submental and no submandibular adenopathy present.       Head (left side): No submental and no submandibular adenopathy present.    She has no cervical adenopathy.  Neurological: She is alert and oriented to person, place, and time.  Psychiatric: She has a normal mood and affect.  Vitals reviewed.    A: 1. Urinary tract infection without hematuria, site unspecified      P: Discussed exam findings, diagnosis etiology and medication use and indications reviewed with patient. Follow- Up and discharge instructions provided. No emergent/urgent issues found on exam.  Patient verbalized understanding of information provided and agrees with plan of care (POC), all questions answered.  1. Urinary tract infection without hematuria, site unspecified - nitrofurantoin, macrocrystal-monohydrate, (MACROBID) 100 MG capsule; Take 1 capsule (100 mg total) by mouth 2 (two) times daily for 7 days. Results for orders placed or performed in visit on 07/03/18 (from the past 24 hour(s))  POCT Urinalysis Dipstick     Status: Abnormal   Collection Time: 07/03/18  6:04 PM  Result Value Ref Range   Color, UA yellow    Clarity, UA clear    Glucose, UA Negative Negative   Bilirubin, UA negative    Ketones, UA negative    Spec Grav, UA 1.010 1.010 - 1.025   Blood,  UA negative    pH, UA 5.5 5.0 - 8.0   Protein, UA Negative Negative   Urobilinogen, UA 0.2 0.2 or 1.0 E.U./dL   Nitrite, UA negative    Leukocytes, UA Moderate (2+) (A) Negative   Appearance clear    Odor no

## 2018-07-03 NOTE — Patient Instructions (Signed)

## 2018-07-04 ENCOUNTER — Encounter (INDEPENDENT_AMBULATORY_CARE_PROVIDER_SITE_OTHER): Payer: Self-pay | Admitting: Orthopedic Surgery

## 2018-07-04 MED FILL — NITROFURANTOIN MONO-MCR 100: 100 | 7 days supply | Qty: 14 | Fill #0

## 2018-07-04 NOTE — Progress Notes (Signed)
Office Visit Note   Patient: Andrea Montes           Date of Birth: [birth year]           MRN: [id number] Visit Date: 07/03/2018 Requested by: Seward Carol, MD [address] [place], [place] [address] PCP: Seward Carol, MD  Subjective: Chief Complaint  Patient presents with  . Follow-up    EMG/NCV review    HPI: Andrea Montes is a patient with right hand pain.  Since of seen her she has had nerve conduction study which shows moderate right median nerve entrapment in the wrist.  She is had previous carpal tunnel release done years ago.  Night splint helps some but she still has daily symptoms.  She states that her hand is numb every day and most of the time.  She is losing strength and dexterity in her hand.  She said over 2 months of symptoms.  She does typing at work.              ROS: All systems reviewed are negative as they relate to the chief complaint within the history of present illness.  Patient denies  fevers or chills.   Assessment & Plan: Visit Diagnoses:  1. Carpal tunnel syndrome, right upper limb     Plan: Impression is right carpal tunnel syndrome recurrent.  Plan is that she is exhausted conservative treatment options.  Plan carpal tunnel release.  We may have to go below the proximal wrist flexion crease.  Risk and benefits of surgery discussed including not limited to infection nerve vessel damage incomplete pain relief.  I think in general though she does have some compression on the nerve and would likely benefit from decompression based on the rather severe nature and extent of her symptoms and failure of conservative treatment.  Follow-Up Instructions: No follow-ups on file.   Orders:  No orders of the defined types were placed in this encounter.  No orders of the defined types were placed in this encounter.     Procedures: No procedures performed   Clinical Data: No additional findings.  Objective: Vital Signs: There were no vitals  taken for this visit.  Physical Exam:   Constitutional: Patient appears well-developed HEENT:  Head: Normocephalic Eyes:EOM are normal Neck: Normal range of motion Cardiovascular: Normal rate Pulmonary/chest: Effort normal Neurologic: Patient is alert Skin: Skin is warm Psychiatric: Patient has normal mood and affect    Ortho Exam: Ortho exam demonstrates good grip strength on the right.  Radial pulses intact.  Looks like she has an incision was placed in 1 of her thenar flexion creases.  No definite severe wasting of the thenar eminence region but there is mild atrophy noted.  Abductor pollicis brevis is functional.  Wrist range of motion otherwise full.  Negative Tinel's cubital tunnel in the elbow.  Specialty Comments:  No specialty comments available.  Imaging: No results found.   PMFS History: Patient Active Problem List   Diagnosis Date Noted  . Trigger finger, acquired 09/09/2015  . Low back pain radiating to both legs 05/06/2012  . Leg length discrepancy 05/06/2012  . HYPERTENSION, BENIGN 04/11/2009  . LEG EDEMA, BILATERAL 10/08/2008  . 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 OVARIAN FAILURE 09/02/2007  . VITAMIN D DEFICIENCY 09/02/2007  . OBESITY, NOS 10/17/2006  . ANEMIA, IRON DEFICIENCY, UNSPEC. 10/17/2006  . CARPAL TUNNEL SYNDROME 10/17/2006   Past Medical History:  Diagnosis Date  . Anemia  no current med.  [name] Apnea   . 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 Back pain   . Carpal tunnel syndrome   . Complication of anesthesia    itching from last surgery that was completed at the surgery center off of [address] patient was given benedryl, generalized itching and they are not sure where that came from  . DDD (degenerative disc disease), lumbar   . Dental crowns present   . Edema of lower extremity    bilateral  . Frozen shoulder    surgery 11-04-14  . GERD (gastroesophageal reflux disease)    no current  med.  [name] GERD (gastroesophageal reflux disease)   . History of blood transfusion   . History of shingles   . Hypertension    borderline not on medicaiton at this time  . Impingement syndrome of left shoulder region 10/2014  . Joint pain   . Migraines   . Obesity   . Rheumatoid arteritis ([place])   . Sleep apnea    uses CPAP nightly  . Trigger finger, left middle finger   . Vitamin D deficiency   . Wears partial dentures    upper    Family History  Problem Relation Age of Onset  . Kidney disease Brother   . Hypertension Brother   . Leukemia Mother   . Heart disease Mother   . Stroke Mother   . Cancer Mother   . Depression Mother   . Anxiety disorder Mother   . AAA (abdominal aortic aneurysm) Father   . Esophageal cancer Maternal Uncle   . Hypertension Brother   . Colon cancer Neg Hx   . Colon polyps Neg Hx   . Rectal cancer Neg Hx   . Stomach cancer Neg Hx     Past Surgical History:  Procedure Laterality Date  . CARPAL TUNNEL RELEASE Left 04/25/2006  . CLOSED MANIPULATION SHOULDER WITH STERIOD INJECTION Left 02/03/2015   Procedure: LEFT  MANIPULATION SHOULDER UNDER ANESTHESIA WITH INJECTION OF STEROID;  Surgeon: Kathryne Hitch, MD;  Location: [facility];  Service: Orthopedics;  Laterality: Left;  . COLONOSCOPY    . COLONOSCOPY WITH PROPOFOL  10/09/2011  . POLYPECTOMY    . RESECTION DISTAL CLAVICAL Left 11/04/2014   Procedure: RESECTION DISTAL CLAVICAL;  Surgeon: Kathryne Hitch, MD;  Location: [facility];  Service: Orthopedics;  Laterality: Left;  . ROTATOR CUFF REPAIR  2010  . SHOULDER ARTHROSCOPY WITH ROTATOR CUFF REPAIR Right 10/04/2008  . SHOULDER ARTHROSCOPY WITH SUBACROMIAL DECOMPRESSION, ROTATOR CUFF REPAIR AND BICEP TENDON REPAIR Left 11/04/2014   Procedure: LEFT SHOULDER ARTHROSCOPY WITH DEBRIDEMENT, DISTAL CLAVICLE EXCISION, ACROMIOPLASTY, ROTATOR CUFF REPAIR AND BICEP TENODESIS;  Surgeon: Kathryne Hitch, MD;  Location: [facility];  Service: Orthopedics;  Laterality: Left;  . TRIGGER FINGER RELEASE Right 02/03/2015   Procedure: RIGHT LONG FINGER TRIGGER RELEASE;  Surgeon: Kathryne Hitch, MD;  Location: [facility];  Service: Orthopedics;  Laterality: Right;  . TRIGGER FINGER RELEASE Left 10/10/2017   Procedure: LEFT HAND RELEASE TRIGGER FINGER 3RD LONG FINGER AND CYST EXCISION;  Surgeon: Meredith Pel, MD;  Location: [facility];  Service: Orthopedics;  Laterality: Left;  [name] VAGINAL HYSTERECTOMY  2001   partial   Social History   Occupational History  . Not on file  Tobacco Use  . Smoking status: Never Smoker  . Smokeless tobacco: Never Used  Substance and Sexual Activity  . Alcohol use: No  . Drug  use: No  . Sexual activity: Not on file

## 2018-07-07 ENCOUNTER — Encounter (INDEPENDENT_AMBULATORY_CARE_PROVIDER_SITE_OTHER): Payer: Self-pay | Admitting: Family Medicine

## 2018-07-07 ENCOUNTER — Other Ambulatory Visit (INDEPENDENT_AMBULATORY_CARE_PROVIDER_SITE_OTHER): Payer: Self-pay | Admitting: Orthopedic Surgery

## 2018-07-07 DIAGNOSIS — E119 Type 2 diabetes mellitus without complications: Secondary | ICD-10-CM

## 2018-07-07 DIAGNOSIS — G5601 Carpal tunnel syndrome, right upper limb: Secondary | ICD-10-CM

## 2018-07-07 NOTE — Pre-Procedure Instructions (Signed)
Andrea Montes  07/07/2018      [place], [place] - [address] [facility]. [address] [place] [address] Phone: [phone] Fax: [place], [place] [address] Phone: [phone] Fax: [phone]    Your procedure is scheduled on July 15, 2018.  Report to [facility] Admitting at 250 PM.  Call this number if you have problems the morning of surgery:  [phone]   Remember:  Do not eat or drink after midnight.    Take these medicines the morning of surgery with A SIP OF WATER  Albuterol inhaler-if needed Flonase nasal spray-if needed Macrobid- (if you are still taking this medication) ProAir Inhailer-if needed Tramadol (ultram)-if needed for pain  Please bring any rescue inhalers with you the day of surgery.  7 days prior to surgery STOP taking any meloxicam (mobic), Aspirin (unless otherwise instructed by your surgeon), Aleve, Naproxen, Ibuprofen, Motrin, Advil, Goody's, BC's, all herbal medications, fish oil, and all vitamins  WHAT DO I DO ABOUT MY DIABETES MEDICATION?   [name] Do not take oral diabetes medicines (pills) the morning of surgery-metformin (glucophage).  Reviewed and Endorsed by [facility] Patient Education Committee, August 2015   How to Manage Your Diabetes Before and After Surgery  Why is it important to control my blood sugar before and after surgery? . Improving blood sugar levels before and after surgery helps healing and can limit problems. . A way of improving blood sugar control is eating a healthy diet by: o  Eating less sugar and carbohydrates o  Increasing activity/exercise o  Talking with your doctor about reaching your blood sugar goals . High blood sugars (greater than 180 mg/dL) can raise your risk of infections and slow your recovery, so  you will need to focus on controlling your diabetes during the weeks before surgery. . Make sure that the doctor who takes care of your diabetes knows about your planned surgery including the date and location.  How do I manage my blood sugar before surgery? . Check your blood sugar at least 4 times a day, starting 2 days before surgery, to make sure that the level is not too high or low. o Check your blood sugar the morning of your surgery when you wake up and every 2 hours until you get to the Short Stay unit. . If your blood sugar is less than 70 mg/dL, you will need to treat for low blood sugar: o Do not take insulin. o Treat a low blood sugar (less than 70 mg/dL) with  cup of clear juice (cranberry or apple), 4 glucose tablets, OR glucose gel. Recheck blood sugar in 15 minutes after treatment (to make sure it is greater than 70 mg/dL). If your blood sugar is not greater than 70 mg/dL on recheck, call [phone] o  for further instructions. . Report your blood sugar to the short stay nurse when you get to Short Stay.  . If you are admitted to the hospital after surgery: o Your blood sugar will be checked by the staff and you will probably be given insulin after surgery (instead of oral diabetes medicines) to make sure you have good blood sugar levels. o The goal for blood sugar control after surgery is 80-180 mg/dL.  [place]- Preparing For Surgery  Before surgery, you can play  an important role. Because skin is not sterile, your skin needs to be as free of germs as possible. You can reduce the number of germs on your skin by washing with CHG (chlorahexidine gluconate) Soap before surgery.  CHG is an antiseptic cleaner which kills germs and bonds with the skin to continue killing germs even after washing.    Oral Hygiene is also important to reduce your risk of infection.  Remember - BRUSH YOUR TEETH THE MORNING OF SURGERY WITH YOUR REGULAR TOOTHPASTE  Please do not use if you have  an allergy to CHG or antibacterial soaps. If your skin becomes reddened/irritated stop using the CHG.  Do not shave (including legs and underarms) for at least 48 hours prior to first CHG shower. It is OK to shave your face.  Please follow these instructions carefully.   1. Shower the NIGHT BEFORE SURGERY and the MORNING OF SURGERY with CHG.   2. If you chose to wash your hair, wash your hair first as usual with your normal shampoo.  3. After you shampoo, rinse your hair and body thoroughly to remove the shampoo.  4. Use CHG as you would any other liquid soap. You can apply CHG directly to the skin and wash gently with a scrungie or a clean washcloth.   5. Apply the CHG Soap to your body ONLY FROM THE NECK DOWN.  Do not use on open wounds or open sores. Avoid contact with your eyes, ears, mouth and genitals (private parts). Wash Face and genitals (private parts)  with your normal soap.  6. Wash thoroughly, paying special attention to the area where your surgery will be performed.  7. Thoroughly rinse your body with warm water from the neck down.  8. DO NOT shower/wash with your normal soap after using and rinsing off the CHG Soap.  9. Pat yourself dry with a CLEAN TOWEL.  10. Wear CLEAN PAJAMAS to bed the night before surgery, wear comfortable clothes the morning of surgery  11. Place CLEAN SHEETS on your bed the night of your first shower and DO NOT SLEEP WITH PETS.  Day of Surgery:  Do not apply any deodorants/lotions.  Please wear clean clothes to the hospital/surgery center.   Remember to brush your teeth WITH YOUR REGULAR TOOTHPASTE.   Do not wear jewelry, make-up or nail polish.  Do not wear lotions, powders, or perfumes, or deodorant.  Do not shave 48 hours prior to surgery.    Do not bring valuables to the hospital.  [facility] is not responsible for any belongings or valuables.  Contacts, dentures or bridgework may not be worn into surgery.  Leave your suitcase in the  car.  After surgery it may be brought to your room.  For patients admitted to the hospital, discharge time will be determined by your treatment team.  Patients discharged the day of surgery will not be allowed to drive home.    Please read over the following fact sheets that you were given. Pain Booklet, Coughing and Deep Breathing, MRSA Information and Surgical Site Infection Prevention

## 2018-07-08 ENCOUNTER — Encounter (INDEPENDENT_AMBULATORY_CARE_PROVIDER_SITE_OTHER): Payer: Self-pay | Admitting: Family Medicine

## 2018-07-08 MED ORDER — LISINOPRIL 2.5 MG PO TABS: 2.5000 mg | ORAL_TABLET | Freq: Every day | ORAL | 0 refills | Status: DC

## 2018-07-08 MED FILL — LISINOPRIL 2.5 MG TABLET: 2.5 | 30 days supply | Qty: 30 | Fill #0

## 2018-07-08 NOTE — Progress Notes (Signed)
.   Office: [phone]  /  Fax: [phone]   HPI:   Chief Complaint: OBESITY Andrea Montes is here to discuss her progress with her obesity treatment plan. She is on the keep a food journal with 350-400 calories and 35+ grams of protein at lunch daily and follow the Category 3 plan and is following her eating plan approximately 100% of the time. She states she is exercising 0 minutes 0 times per week. Andrea Montes like Category 3 with journaling at lunch. She is eating all the food. She feels she has an urinary tract infection and will be going to urgent care today.  Her weight is 260 lb (117.9 kg) today and has had a weight loss of 4 pounds over a period of 2 weeks since her last visit. She has lost 10 lbs since starting treatment with [name].  Diabetes II Andrea Montes has a diagnosis of diabetes type II. Andrea Montes states she is not checking BGs at home. She denies nausea, vomiting, or diarrhea. Last A1c was 6.7. She denies polyphagia or hypoglycemia. She has been working on intensive lifestyle modifications including diet, exercise, and weight loss to help control her blood glucose levels.  Vitamin D Deficiency Andrea Montes has a diagnosis of vitamin D deficiency. She is stable on prescription Vit D, but level is not at goal. She denies fatigue and notes she has increased energy. She denies nausea, vomiting or muscle weakness.  At risk for osteopenia and osteoporosis Andrea Montes is at higher risk of osteopenia and osteoporosis due to vitamin D deficiency.   ALLERGIES: No Known Allergies  MEDICATIONS: Current Outpatient Medications on File Prior to Visit  Medication Sig Dispense Refill  . bacitracin 500 UNIT/GM ointment Apply 1 application topically 2 (two) times daily. 15 g 0  . furosemide (LASIX) 20 MG tablet Take 1 tablet (20 mg total) by mouth daily. (Patient taking differently: Take 20 mg by mouth daily as needed for fluid. ) 30 tablet 11  . meloxicam (MOBIC) 15 MG tablet Take 1 tablet (15 mg total) by mouth  daily as needed for pain. 60 tablet 1  . metFORMIN (GLUCOPHAGE) 500 MG tablet Take 1 tablet (500 mg total) by mouth daily with breakfast. 30 tablet 0  . montelukast (SINGULAIR) 10 MG tablet Take 1 tablet (10 mg total) by mouth at bedtime. 30 tablet 0  . phenazopyridine (PYRIDIUM) 100 MG tablet Take 1 tablet (100 mg total) by mouth 3 (three) times daily as needed for pain. (Patient not taking: Reported on 07/03/2018) 10 tablet 0  . PROAIR HFA 108 (90 Base) MCG/ACT inhaler INHALE TWO PUFFS BY MOUTH EVERY 4 TO 6 HOURS AS NEEDED FOR SHORTNESS OF BREATH  0  . SUMAtriptan (IMITREX) 25 MG tablet Take 25 mg by mouth every 2 (two) hours as needed for migraine or headache.   1  . traMADol (ULTRAM) 50 MG tablet Take 1 tablet (50 mg total) by mouth every 6 (six) hours as needed. (Patient taking differently: Take 50 mg by mouth every 6 (six) hours as needed (for pain.). ) 60 tablet 2  . Vitamin D, Ergocalciferol, (DRISDOL) 50000 units CAPS capsule Take 1 capsule (50,000 Units total) by mouth every 7 (seven) days. 4 capsule 0  . albuterol (PROVENTIL HFA;VENTOLIN HFA) 108 (90 Base) MCG/ACT inhaler Inhale 2 puffs into the lungs every 6 (six) hours as needed for up to 10 days for wheezing or shortness of breath. 1 Inhaler 0  . fluticasone (FLONASE) 50 MCG/ACT nasal spray Place 2 sprays into both nostrils  daily for 10 days. 16 g 0   Current Facility-Administered Medications on File Prior to Visit  Medication Dose Route Frequency Provider Last Rate Last Dose  . 0.9 %  sodium chloride infusion  500 mL Intravenous Continuous Nandigam, Venia Minks, MD        PAST MEDICAL HISTORY: Past Medical History:  Diagnosis Date  . Anemia    no current med.  [name] Apnea   . 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 Back pain   . Carpal tunnel syndrome   . Complication of anesthesia    itching from last surgery that was completed at the surgery center off of [address] patient was given benedryl, generalized  itching and they are not sure where that came from  . DDD (degenerative disc disease), lumbar   . Dental crowns present   . Edema of lower extremity    bilateral  . Frozen shoulder    surgery 11-04-14  . GERD (gastroesophageal reflux disease)    no current med.  [name] GERD (gastroesophageal reflux disease)   . History of blood transfusion   . History of shingles   . Hypertension    borderline not on medicaiton at this time  . Impingement syndrome of left shoulder region 10/2014  . Joint pain   . Migraines   . Obesity   . 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 Sleep apnea    uses CPAP nightly  . Trigger finger, left middle finger   . Vitamin D deficiency   . Wears partial dentures    upper    PAST SURGICAL HISTORY: Past Surgical History:  Procedure Laterality Date  . CARPAL TUNNEL RELEASE Left 04/25/2006  . CLOSED MANIPULATION SHOULDER WITH STERIOD INJECTION Left 02/03/2015   Procedure: LEFT  MANIPULATION SHOULDER UNDER ANESTHESIA WITH INJECTION OF STEROID;  Surgeon: Kathryne Hitch, MD;  Location: [place];  Service: Orthopedics;  Laterality: Left;  . COLONOSCOPY    . COLONOSCOPY WITH PROPOFOL  10/09/2011  . POLYPECTOMY    . RESECTION DISTAL CLAVICAL Left 11/04/2014   Procedure: RESECTION DISTAL CLAVICAL;  Surgeon: Kathryne Hitch, MD;  Location: [place];  Service: Orthopedics;  Laterality: Left;  . ROTATOR CUFF REPAIR  2010  . SHOULDER ARTHROSCOPY WITH ROTATOR CUFF REPAIR Right 10/04/2008  . SHOULDER ARTHROSCOPY WITH SUBACROMIAL DECOMPRESSION, ROTATOR CUFF REPAIR AND BICEP TENDON REPAIR Left 11/04/2014   Procedure: LEFT SHOULDER ARTHROSCOPY WITH DEBRIDEMENT, DISTAL CLAVICLE EXCISION, ACROMIOPLASTY, ROTATOR CUFF REPAIR AND BICEP TENODESIS;  Surgeon: Kathryne Hitch, MD;  Location: [place];  Service: Orthopedics;  Laterality: Left;  . TRIGGER FINGER RELEASE Right 02/03/2015   Procedure: RIGHT LONG FINGER TRIGGER RELEASE;  Surgeon: Kathryne Hitch, MD;   Location: [place];  Service: Orthopedics;  Laterality: Right;  . TRIGGER FINGER RELEASE Left 10/10/2017   Procedure: LEFT HAND RELEASE TRIGGER FINGER 3RD LONG FINGER AND CYST EXCISION;  Surgeon: Meredith Pel, MD;  Location: [place];  Service: Orthopedics;  Laterality: Left;  [name] VAGINAL HYSTERECTOMY  2001   partial    SOCIAL HISTORY: Social History   Tobacco Use  . Smoking status: Never Smoker  . Smokeless tobacco: Never Used  Substance Use Topics  . Alcohol use: No  . Drug use: No    FAMILY HISTORY: Family History  Problem Relation Age of Onset  . Kidney disease Brother   . Hypertension Brother   . Leukemia Mother   . Heart disease Mother   .  Stroke Mother   . Cancer Mother   . Depression Mother   . Anxiety disorder Mother   . AAA (abdominal aortic aneurysm) Father   . Esophageal cancer Maternal Uncle   . Hypertension Brother   . Colon cancer Neg Hx   . Colon polyps Neg Hx   . Rectal cancer Neg Hx   . Stomach cancer Neg Hx     ROS: Review of Systems  Constitutional: Positive for weight loss. Negative for malaise/fatigue.  Gastrointestinal: Negative for diarrhea, nausea and vomiting.  Musculoskeletal:       Negative muscle weakness  Endo/Heme/Allergies:       Negative polyphagia Negative hypoglycemia    PHYSICAL EXAM: Blood pressure 119/71, pulse (!) 59, temperature 97.8 F (36.6 C), temperature source Oral, height 5\' 3"  (1.6 m), weight 260 lb (117.9 kg), SpO2 98 %. Body mass index is 46.06 kg/m. Physical Exam  Constitutional: She is oriented to person, place, and time. She appears well-developed and well-nourished.  Cardiovascular: Normal rate.  Pulmonary/Chest: Effort normal.  Musculoskeletal: Normal range of motion.  Neurological: She is oriented to person, place, and time.  Skin: Skin is warm and dry.  Psychiatric: She has a normal mood and affect. Her behavior is normal.  Vitals reviewed.   RECENT LABS AND TESTS: BMET      Component Value Date/Time   NA 139 05/07/2018 1424   K 3.9 05/07/2018 1424   CL 100 05/07/2018 1424   CO2 25 05/07/2018 1424   GLUCOSE 99 05/07/2018 1424   GLUCOSE 136 (H) 10/08/2017 0841   BUN 10 05/07/2018 1424   CREATININE 0.82 05/07/2018 1424   CREATININE 0.85 03/07/2016 1654   CALCIUM 8.7 05/07/2018 1424   GFRNONAA 78 05/07/2018 1424   GFRAA 90 05/07/2018 1424   Lab Results  Component Value Date   HGBA1C 6.7 (H) 05/07/2018   Lab Results  Component Value Date   INSULIN 39.8 (H) 05/07/2018   CBC    Component Value Date/Time   WBC 6.5 05/07/2018 1424   WBC 16.9 (H) 10/08/2017 0841   RBC 5.34 (H) 05/07/2018 1424   RBC 5.75 (H) 10/08/2017 0841   HGB 11.8 05/07/2018 1424   HCT 36.9 05/07/2018 1424   PLT 233 10/08/2017 0841   MCV 69 (L) 05/07/2018 1424   MCH 22.1 (L) 05/07/2018 1424   MCH 22.1 (L) 10/08/2017 0841   MCHC 32.0 05/07/2018 1424   MCHC 31.4 10/08/2017 0841   RDW 16.4 (H) 05/07/2018 1424   LYMPHSABS 2.9 05/07/2018 1424   MONOABS 0.5 07/14/2017 2115   EOSABS 0.1 05/07/2018 1424   BASOSABS 0.0 05/07/2018 1424   Iron/TIBC/Ferritin/ %Sat No results found for: IRON, TIBC, FERRITIN, IRONPCTSAT Lipid Panel     Component Value Date/Time   CHOL 130 05/07/2018 1424   TRIG 89 05/07/2018 1424   HDL 35 (L) 05/07/2018 1424   CHOLHDL 4.1 Ratio 09/26/2009 1823   VLDL 26 09/26/2009 1823   LDLCALC 77 05/07/2018 1424   Hepatic Function Panel     Component Value Date/Time   PROT 6.9 05/07/2018 1424   ALBUMIN 3.7 05/07/2018 1424   AST 17 05/07/2018 1424   ALT 6 05/07/2018 1424   ALKPHOS 93 05/07/2018 1424   BILITOT 0.2 05/07/2018 1424      Component Value Date/Time   TSH 1.470 05/07/2018 1424   TSH 1.55 11/25/2008 1700  Results for VERLEAN, ALLPORT (MRN [id number]) as of 07/08/2018 12:12  Ref. Range 05/07/2018 14:24  Vitamin D, 25-Hydroxy Latest Ref  Range: 30.0 - 100.0 ng/mL 14.4 (L)    ASSESSMENT AND PLAN: Type 2 diabetes mellitus without  complication, without long-term current use of insulin (HCC) - Plan: atorvastatin (LIPITOR) 10 MG tablet  Vitamin D deficiency  At risk for osteoporosis  Class 3 severe obesity with serious comorbidity and body mass index (BMI) of 45.0 to 49.9 in adult, unspecified obesity type ([place])  PLAN:  Diabetes II Andrea Montes has been given extensive diabetes education by myself today including ideal fasting and post-prandial blood glucose readings, individual ideal Hgb A1c goals  and hypoglycemia prevention. We discussed the importance of good blood sugar control to decrease the likelihood of diabetic complications such as nephropathy, neuropathy, limb loss, blindness, coronary artery disease, and death. We discussed the importance of intensive lifestyle modification including diet, exercise and weight loss as the first line treatment for diabetes. Andrea Montes agrees to continue atorvastatin 10 mg qd at bedtime #30 and we will refill for 1 month. She agrees to continue taking metformin and will continue diet. Andrea Montes agrees to follow up with our clinic in 2 weeks.  Vitamin D Deficiency Andrea Montes was informed that low vitamin D levels contributes to fatigue and are associated with obesity, breast, and colon cancer. Andrea Montes agrees to continue taking prescription Vit D @50 ,000 IU every week and will follow up for routine testing of vitamin D, at least 2-3 times per year. She was informed of the risk of over-replacement of vitamin D and agrees to not increase her dose unless she discusses this with [name] first. Andrea Montes agrees to follow up with our clinic in 2 weeks.  At risk for osteopenia and osteoporosis Andrea Montes was given extended (15 minutes) osteoporosis prevention counseling today. Andrea Montes is at risk for osteopenia and osteoporsis due to her vitamin D deficiency. She was encouraged to take her vitamin D and follow her higher calcium diet and increase strengthening exercise to help strengthen her bones and decrease her risk of  osteopenia and osteoporosis.  Obesity Andrea Montes is currently in the action stage of change. As such, her goal is to continue with weight loss efforts She has agreed to keep a food journal with 350-400 calories and 35+ grams of protein at lunch daily and follow the Category 3 plan Andrea Montes has not been prescribed exercise at this time.  We discussed the following Behavioral Modification Strategies today: holiday eating strategies, increase H20 intake, keeping healthy foods in the home, and planning for success   Andrea Montes has agreed to follow up with our clinic in 2 weeks. She was informed of the importance of frequent follow up visits to maximize her success with intensive lifestyle modifications for her multiple health conditions.   OBESITY BEHAVIORAL INTERVENTION VISIT  Today's visit was # 5  Starting weight: 270 lbs Starting date: 05/07/18 Today's weight : 260 lbs Today's date: 07/03/2018 Total lbs lost to date: 10    ASK: We discussed the diagnosis of obesity with Andrea Montes today and Andrea Montes agreed to give [name] permission to discuss obesity behavioral modification therapy today.  ASSESS: Andrea Montes has the diagnosis of obesity and her BMI today is 46.07 Andrea Montes is in the action stage of change   ADVISE: Andrea Montes was educated on the multiple health risks of obesity as well as the benefit of weight loss to improve her health. She was advised of the need for long term treatment and the importance of lifestyle modifications to improve her current health and to decrease her risk of future health problems.  AGREE: Multiple dietary  modification options and treatment options were discussed and  Andrea Montes agreed to follow the recommendations documented in the above note.  ARRANGE: Andrea Montes was educated on the importance of frequent visits to treat obesity as outlined per CMS and USPSTF guidelines and agreed to schedule her next follow up appointment today.  Andrea Montes, am acting as  Location manager for [employer], FNP-C.  I have reviewed the above documentation for accuracy and completeness, and I agree with the above.  - Shemeika Starzyk, FNP-C.

## 2018-07-09 ENCOUNTER — Encounter (HOSPITAL_COMMUNITY): Payer: Self-pay

## 2018-07-09 ENCOUNTER — Other Ambulatory Visit: Payer: Self-pay

## 2018-07-09 ENCOUNTER — Encounter (HOSPITAL_COMMUNITY)
Admission: RE | Admit: 2018-07-09 | Discharge: 2018-07-09 | Disposition: A | Discharge status: Home / Self Care | Payer: 59 | Source: Ambulatory Visit | Attending: Orthopedic Surgery | Admitting: Orthopedic Surgery

## 2018-07-09 DIAGNOSIS — Z01818 Encounter for other preprocedural examination: Secondary | ICD-10-CM | POA: Insufficient documentation

## 2018-07-09 DIAGNOSIS — G5601 Carpal tunnel syndrome, right upper limb: Secondary | ICD-10-CM | POA: Insufficient documentation

## 2018-07-09 HISTORY — DX: Other seasonal allergic rhinitis: J30.2

## 2018-07-09 HISTORY — DX: Prediabetes: R73.03

## 2018-07-09 HISTORY — DX: Cardiac murmur, unspecified: R01.1

## 2018-07-09 LAB — CBC
HEMATOCRIT: 44.8 % (ref 36.0–46.0)
Hemoglobin: 13.1 g/dL (ref 12.0–15.0)
MCH: 21 pg — ABNORMAL LOW (ref 26.0–34.0)
MCHC: 29.2 g/dL — AB (ref 30.0–36.0)
MCV: 71.8 fL — ABNORMAL LOW (ref 80.0–100.0)
Platelets: 270 10*3/uL (ref 150–400)
RBC: 6.24 MIL/uL — ABNORMAL HIGH (ref 3.87–5.11)
RDW: 15.1 % (ref 11.5–15.5)
WBC: 7.5 10*3/uL (ref 4.0–10.5)
nRBC: 0 % (ref 0.0–0.2)

## 2018-07-09 LAB — GLUCOSE, CAPILLARY: Glucose-Capillary: 110 mg/dL — ABNORMAL HIGH (ref 70–99)

## 2018-07-09 LAB — BASIC METABOLIC PANEL
Anion gap: 7 (ref 5–15)
BUN: 10 mg/dL (ref 6–20)
CHLORIDE: 108 mmol/L (ref 98–111)
CO2: 25 mmol/L (ref 22–32)
Calcium: 9 mg/dL (ref 8.9–10.3)
Creatinine, Ser: 0.85 mg/dL (ref 0.44–1.00)
GFR calc Af Amer: >60 mL/min (ref >60)
GFR calc non Af Amer: >60 mL/min (ref >60)
Glucose, Bld: 124 mg/dL — ABNORMAL HIGH (ref 70–99)
POTASSIUM: 4.2 mmol/L (ref 3.5–5.1)
SODIUM: 140 mmol/L (ref 135–145)

## 2018-07-09 LAB — HEMOGLOBIN A1C
Hgb A1c MFr Bld: 6.3 % — ABNORMAL HIGH (ref 4.8–5.6)
MEAN PLASMA GLUCOSE: 134.11 mg/dL

## 2018-07-09 LAB — SURGICAL PCR SCREEN
MRSA, PCR: NEGATIVE
Staphylococcus aureus: NEGATIVE

## 2018-07-09 MED FILL — MELOXICAM 15 MG TABLET: 15 | 30 days supply | Qty: 30 | Fill #1

## 2018-07-09 NOTE — Pre-Procedure Instructions (Signed)
Andrea Montes  07/09/2018      [place], [place] - [address] [facility]. [address] [place] [address] Phone: [phone] Fax: [facility], [place] [address] Phone: [phone] Fax: [phone]    Your procedure is scheduled on July 15, 2018.  Report to [facility] Admitting at 250 PM.  Call this number if you have problems the morning of surgery:  [phone]   Remember:  Do not eat or drink after midnight.    Take these medicines the morning of surgery with A SIP OF WATER    ProAir Inhailer-if needed Tramadol (ultram)-if needed for pain  Please bring any rescue inhalers with you the day of surgery.  7 days prior to surgery STOP taking any meloxicam (mobic), Aspirin (unless otherwise instructed by your surgeon), Aleve, Naproxen, Ibuprofen, Motrin, Advil, Goody's, BC's, all herbal medications, fish oil, and all vitamins  WHAT DO I DO ABOUT MY DIABETES MEDICATION?   [name] Do not take oral diabetes medicines (pills) the morning of surgery-metformin (glucophage).  Reviewed and Endorsed by [facility] Patient Education Committee, August 2015   How to Manage Your Diabetes Before and After Surgery  Why is it important to control my blood sugar before and after surgery? . Improving blood sugar levels before and after surgery helps healing and can limit problems. . A way of improving blood sugar control is eating a healthy diet by: o  Eating less sugar and carbohydrates o  Increasing activity/exercise o  Talking with your doctor about reaching your blood sugar goals . High blood sugars (greater than 180 mg/dL) can raise your risk of infections and slow your recovery, so you will need to focus on controlling your diabetes during the weeks before surgery. . Make sure that the  doctor who takes care of your diabetes knows about your planned surgery including the date and location.  How do I manage my blood sugar before surgery? . Check your blood sugar at least 4 times a day, starting 2 days before surgery, to make sure that the level is not too high or low. o Check your blood sugar the morning of your surgery when you wake up and every 2 hours until you get to the Short Stay unit. . If your blood sugar is less than 70 mg/dL, you will need to treat for low blood sugar: o Do not take insulin. o Treat a low blood sugar (less than 70 mg/dL) with  cup of clear juice (cranberry or apple), 4 glucose tablets, OR glucose gel. Recheck blood sugar in 15 minutes after treatment (to make sure it is greater than 70 mg/dL). If your blood sugar is not greater than 70 mg/dL on recheck, call [phone] o  for further instructions. . Report your blood sugar to the short stay nurse when you get to Short Stay.  . If you are admitted to the hospital after surgery: o Your blood sugar will be checked by the staff and you will probably be given insulin after surgery (instead of oral diabetes medicines) to make sure you have good blood sugar levels. o The goal for blood sugar control after surgery is 80-180 mg/dL.  [facility]- Preparing For Surgery  Before surgery, you can play an important role. Because skin is not sterile, your skin needs to be  as free of germs as possible. You can reduce the number of germs on your skin by washing with CHG (chlorahexidine gluconate) Soap before surgery.  CHG is an antiseptic cleaner which kills germs and bonds with the skin to continue killing germs even after washing.    Oral Hygiene is also important to reduce your risk of infection.  Remember - BRUSH YOUR TEETH THE MORNING OF SURGERY WITH YOUR REGULAR TOOTHPASTE  Please do not use if you have an allergy to CHG or antibacterial soaps. If your skin becomes reddened/irritated stop using the CHG.  Do  not shave (including legs and underarms) for at least 48 hours prior to first CHG shower. It is OK to shave your face.  Please follow these instructions carefully.   1. Shower the NIGHT BEFORE SURGERY and the MORNING OF SURGERY with CHG.   2. If you chose to wash your hair, wash your hair first as usual with your normal shampoo.  3. After you shampoo, rinse your hair and body thoroughly to remove the shampoo.  4. Use CHG as you would any other liquid soap. You can apply CHG directly to the skin and wash gently with a scrungie or a clean washcloth.   5. Apply the CHG Soap to your body ONLY FROM THE NECK DOWN.  Do not use on open wounds or open sores. Avoid contact with your eyes, ears, mouth and genitals (private parts). Wash Face and genitals (private parts)  with your normal soap.  6. Wash thoroughly, paying special attention to the area where your surgery will be performed.  7. Thoroughly rinse your body with warm water from the neck down.  8. DO NOT shower/wash with your normal soap after using and rinsing off the CHG Soap.  9. Pat yourself dry with a CLEAN TOWEL.  10. Wear CLEAN PAJAMAS to bed the night before surgery, wear comfortable clothes the morning of surgery  11. Place CLEAN SHEETS on your bed the night of your first shower and DO NOT SLEEP WITH PETS.  Day of Surgery:  Do not apply any deodorants/lotions.  Please wear clean clothes to the hospital/surgery center.   Remember to brush your teeth WITH YOUR REGULAR TOOTHPASTE.   Do not wear jewelry, make-up or nail polish.  Do not wear lotions, powders, or perfumes, or deodorant.  Do not shave 48 hours prior to surgery.    Do not bring valuables to the hospital.  [facility] is not responsible for any belongings or valuables.  Contacts, dentures or bridgework may not be worn into surgery.  Leave your suitcase in the car.  After surgery it may be brought to your room.  For patients admitted to the hospital, discharge  time will be determined by your treatment team.  Patients discharged the day of surgery will not be allowed to drive home.    Please read over the following fact sheets that you were given. Pain Booklet, Coughing and Deep Breathing, MRSA Information and Surgical Site Infection Prevention

## 2018-07-09 NOTE — Progress Notes (Signed)
Pt. Reports a minimal heart murmur, had an echo in 2010. Pt. Has seen Dr. Edmonia James in the past, not f/u'd recently.  Pt. Followed by Dr. Delfina Redwood for PCP, also is seen in Weight mgt. grp with [place], has accomplished 15 lb. Weight loss. Pt. Unsure about MRSA history. Doesn't remember ever being treated.  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 Call to Anesth. Consult- Jeneen Rinks PA, pt. Encouraged to call Dr. Randel Pigg office to inquire about any consumption of liquids the day of surgery.

## 2018-07-14 MED ORDER — DEXTROSE 5 % IV SOLN
3.0000 g | INTRAVENOUS | Status: AC
  Administered 2018-07-15: 3 g via INTRAVENOUS
  Filled 2018-07-14: qty 3

## 2018-07-14 NOTE — H&P (Signed)
Andrea Montes is an 62 y.o. female.   Chief Complaint: Right hand pain HPI: Andrea Montes is a patient with right hand pain.  She had carpal tunnel release done years ago and describes recurrent symptoms.  EMG nerve study shows moderate carpal tunnel syndrome.  She is had over 2 months of symptoms and is losing function and dexterity in the hand.  She is tried night splints and other conservative measures without relief.  Past Medical History:  Diagnosis Date  . Anemia    no current med.  [name] Apnea   . 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 Back pain   . Carpal tunnel syndrome   . Complication of anesthesia    itching from last surgery that was completed at the surgery center off of [address] patient was given benedryl, generalized itching and they are not sure where that came from  . DDD (degenerative disc disease), lumbar   . Dental crowns present   . Edema of lower extremity    bilateral  . Frozen shoulder    surgery 11-04-14  . GERD (gastroesophageal reflux disease)    no current med.  [name] GERD (gastroesophageal reflux disease)   . Heart murmur    last  echo- 2010  . History of blood transfusion   . History of shingles   . Hypertension    borderline not on medicaiton at this time  . Impingement syndrome of left shoulder region 10/2014  . Joint pain   . Migraines   . Obesity   . Pre-diabetes   . Rheumatoid arteritis ([place])   . Seasonal allergies   . Sleep apnea    uses CPAP nightly  . Trigger finger, left middle finger   . Vitamin D deficiency   . Wears partial dentures    upper    Past Surgical History:  Procedure Laterality Date  . CARPAL TUNNEL RELEASE Left 04/25/2006  . CLOSED MANIPULATION SHOULDER WITH STERIOD INJECTION Left 02/03/2015   Procedure: LEFT  MANIPULATION SHOULDER UNDER ANESTHESIA WITH INJECTION OF STEROID;  Surgeon: Kathryne Hitch, MD;  Location: [facility];  Service: Orthopedics;  Laterality: Left;  . COLONOSCOPY    .  COLONOSCOPY WITH PROPOFOL  10/09/2011  . POLYPECTOMY    . RESECTION DISTAL CLAVICAL Left 11/04/2014   Procedure: RESECTION DISTAL CLAVICAL;  Surgeon: Kathryne Hitch, MD;  Location: [facility];  Service: Orthopedics;  Laterality: Left;  . ROTATOR CUFF REPAIR  2010  . SHOULDER ARTHROSCOPY WITH ROTATOR CUFF REPAIR Right 10/04/2008  . SHOULDER ARTHROSCOPY WITH SUBACROMIAL DECOMPRESSION, ROTATOR CUFF REPAIR AND BICEP TENDON REPAIR Left 11/04/2014   Procedure: LEFT SHOULDER ARTHROSCOPY WITH DEBRIDEMENT, DISTAL CLAVICLE EXCISION, ACROMIOPLASTY, ROTATOR CUFF REPAIR AND BICEP TENODESIS;  Surgeon: Kathryne Hitch, MD;  Location: [place];  Service: Orthopedics;  Laterality: Left;  . TRIGGER FINGER RELEASE Right 02/03/2015   Procedure: RIGHT LONG FINGER TRIGGER RELEASE;  Surgeon: Kathryne Hitch, MD;  Location: [facility];  Service: Orthopedics;  Laterality: Right;  . TRIGGER FINGER RELEASE Left 10/10/2017   Procedure: LEFT HAND RELEASE TRIGGER FINGER 3RD LONG FINGER AND CYST EXCISION;  Surgeon: Meredith Pel, MD;  Location: [facility];  Service: Orthopedics;  Laterality: Left;  . TUBAL LIGATION    . VAGINAL HYSTERECTOMY  2001   partial    Family History  Problem Relation Age of Onset  . Kidney disease Brother   . Hypertension Brother   . Leukemia Mother   .  Heart disease Mother   . Stroke Mother   . Cancer Mother   . Depression Mother   . Anxiety disorder Mother   . AAA (abdominal aortic aneurysm) Father   . Esophageal cancer Maternal Uncle   . Hypertension Brother   . Colon cancer Neg Hx   . Colon polyps Neg Hx   . Rectal cancer Neg Hx   . Stomach cancer Neg Hx    Social History:  reports that she has never smoked. She has never used smokeless tobacco. She reports that she does not drink alcohol or use drugs.  Allergies:  Allergies  Allergen Reactions  . Kiwi Extract Itching and Other (See Comments)    Itching in mouth with KIWI, And pineapple  juice- topically irritating     No medications prior to admission.    No results found for this or any previous visit (from the past 48 hour(s)). No results found.  Review of Systems  Musculoskeletal: Positive for joint pain.  All other systems reviewed and are negative.   There were no vitals taken for this visit. Physical Exam  Constitutional: She appears well-developed.  HENT:  Head: Normocephalic.  Eyes: Pupils are equal, round, and reactive to light.  Neck: Normal range of motion.  Cardiovascular: Normal rate.  Respiratory: Effort normal.  Neurological: She is alert.  Skin: Skin is warm.  Psychiatric: She has a normal mood and affect.  Examination of the right hand demonstrates reasonable grip strength.  EPL FPL interosseous function is intact.  Mild abductor pollicis brevis wasting is present.  Very remote incision over the carpal canal.  Radial pulses intact.  Assessment/Plan Impression is recurrent carpal tunnel syndrome in a patient with moderate carpal tunnel compression of the median nerve on EMG nerve study.  She is failed conservative measures.  Plan is for revision carpal tunnel release.  Risks and benefits are discussed with the patient including but limited to infection nerve vessel damage and potential complete pain relief.  We may have to extend the incision below the wrist flexion crease in order to facilitate safer exposure.  All questions answered  Anderson Malta, MD 07/14/2018, 11:04 PM

## 2018-07-15 ENCOUNTER — Ambulatory Visit (HOSPITAL_COMMUNITY): Payer: 59 | Admitting: Certified Registered Nurse Anesthetist

## 2018-07-15 ENCOUNTER — Other Ambulatory Visit: Payer: Self-pay

## 2018-07-15 ENCOUNTER — Encounter (HOSPITAL_COMMUNITY): Payer: Self-pay | Admitting: *Deleted

## 2018-07-15 ENCOUNTER — Ambulatory Visit (HOSPITAL_COMMUNITY)
Admission: RE | Admit: 2018-07-15 | Discharge: 2018-07-15 | Disposition: A | Discharge status: Home / Self Care | Payer: 59 | Source: Ambulatory Visit | Attending: Orthopedic Surgery | Admitting: Orthopedic Surgery

## 2018-07-15 ENCOUNTER — Ambulatory Visit (HOSPITAL_COMMUNITY): Payer: 59 | Admitting: Physician Assistant

## 2018-07-15 ENCOUNTER — Encounter (HOSPITAL_COMMUNITY)
Admission: RE | Disposition: A | Discharge status: Home / Self Care | Payer: Self-pay | Source: Ambulatory Visit | Attending: Orthopedic Surgery

## 2018-07-15 DIAGNOSIS — D509 Iron deficiency anemia, unspecified: Secondary | ICD-10-CM | POA: Diagnosis not present

## 2018-07-15 DIAGNOSIS — Z7984 Long term (current) use of oral hypoglycemic drugs: Secondary | ICD-10-CM | POA: Insufficient documentation

## 2018-07-15 DIAGNOSIS — Z6841 Body Mass Index (BMI) 40.0 and over, adult: Secondary | ICD-10-CM | POA: Diagnosis not present

## 2018-07-15 DIAGNOSIS — G5601 Carpal tunnel syndrome, right upper limb: Secondary | ICD-10-CM

## 2018-07-15 DIAGNOSIS — I10 Essential (primary) hypertension: Secondary | ICD-10-CM | POA: Diagnosis not present

## 2018-07-15 DIAGNOSIS — K219 Gastro-esophageal reflux disease without esophagitis: Secondary | ICD-10-CM | POA: Diagnosis not present

## 2018-07-15 DIAGNOSIS — R7303 Prediabetes: Secondary | ICD-10-CM | POA: Diagnosis not present

## 2018-07-15 DIAGNOSIS — Z791 Long term (current) use of non-steroidal anti-inflammatories (NSAID): Secondary | ICD-10-CM | POA: Diagnosis not present

## 2018-07-15 DIAGNOSIS — E559 Vitamin D deficiency, unspecified: Secondary | ICD-10-CM | POA: Insufficient documentation

## 2018-07-15 DIAGNOSIS — G4730 Sleep apnea, unspecified: Secondary | ICD-10-CM | POA: Diagnosis not present

## 2018-07-15 DIAGNOSIS — Z7982 Long term (current) use of aspirin: Secondary | ICD-10-CM | POA: Insufficient documentation

## 2018-07-15 HISTORY — PX: CARPAL TUNNEL RELEASE: SHX101

## 2018-07-15 SURGERY — CARPAL TUNNEL RELEASE
Anesthesia: General | Site: Hand | Laterality: Right

## 2018-07-15 MED ORDER — MIDAZOLAM HCL 2 MG/2ML IJ SOLN
INTRAMUSCULAR | Status: DC | PRN
  Administered 2018-07-15: 2 mg via INTRAVENOUS

## 2018-07-15 MED ORDER — PROPOFOL 500 MG/50ML IV EMUL: INTRAVENOUS | Status: DC | PRN

## 2018-07-15 MED ORDER — CLONIDINE HCL (ANALGESIA) 100 MCG/ML EP SOLN
EPIDURAL | Status: DC | PRN
  Administered 2018-07-15: 1 mL

## 2018-07-15 MED ORDER — FENTANYL CITRATE (PF) 100 MCG/2ML IJ SOLN
INTRAMUSCULAR | Status: AC
  Filled 2018-07-15: qty 2

## 2018-07-15 MED ORDER — ACETAMINOPHEN 10 MG/ML IV SOLN
INTRAVENOUS | Status: AC
  Filled 2018-07-15: qty 100

## 2018-07-15 MED ORDER — CLONIDINE HCL (ANALGESIA) 100 MCG/ML EP SOLN
EPIDURAL | Status: AC
  Filled 2018-07-15: qty 10

## 2018-07-15 MED ORDER — FENTANYL CITRATE (PF) 250 MCG/5ML IJ SOLN
INTRAMUSCULAR | Status: AC
  Filled 2018-07-15: qty 5

## 2018-07-15 MED ORDER — MORPHINE SULFATE (PF) 4 MG/ML IV SOLN
INTRAVENOUS | Status: DC | PRN
  Administered 2018-07-15: 8 mg

## 2018-07-15 MED ORDER — ONDANSETRON HCL 4 MG/2ML IJ SOLN
INTRAMUSCULAR | Status: DC | PRN
  Administered 2018-07-15: 4 mg via INTRAVENOUS

## 2018-07-15 MED ORDER — PROPOFOL 10 MG/ML IV BOLUS
INTRAVENOUS | Status: AC
  Filled 2018-07-15: qty 20

## 2018-07-15 MED ORDER — FENTANYL CITRATE (PF) 250 MCG/5ML IJ SOLN
INTRAMUSCULAR | Status: DC | PRN
  Administered 2018-07-15: 50 ug via INTRAVENOUS
  Administered 2018-07-15: 100 ug via INTRAVENOUS

## 2018-07-15 MED ORDER — LACTATED RINGERS IV SOLN
INTRAVENOUS | Status: DC
  Administered 2018-07-15: 14:00:00 via INTRAVENOUS

## 2018-07-15 MED ORDER — MIDAZOLAM HCL 2 MG/2ML IJ SOLN
INTRAMUSCULAR | Status: AC
  Filled 2018-07-15: qty 2

## 2018-07-15 MED ORDER — LIDOCAINE 2% (20 MG/ML) 5 ML SYRINGE
INTRAMUSCULAR | Status: AC
  Filled 2018-07-15: qty 5

## 2018-07-15 MED ORDER — ACETAMINOPHEN 10 MG/ML IV SOLN
1000.0000 mg | Freq: Once | INTRAVENOUS | Status: AC
  Administered 2018-07-15: 1000 mg via INTRAVENOUS

## 2018-07-15 MED ORDER — DEXAMETHASONE SODIUM PHOSPHATE 10 MG/ML IJ SOLN
INTRAMUSCULAR | Status: AC
  Filled 2018-07-15: qty 1

## 2018-07-15 MED ORDER — BUPIVACAINE HCL (PF) 0.25 % IJ SOLN
INTRAMUSCULAR | Status: AC
  Filled 2018-07-15: qty 30

## 2018-07-15 MED ORDER — BUPIVACAINE HCL (PF) 0.25 % IJ SOLN
INTRAMUSCULAR | Status: DC | PRN
  Administered 2018-07-15: 15 mL

## 2018-07-15 MED ORDER — LIDOCAINE HCL (PF) 0.5 % IJ SOLN
INTRAMUSCULAR | Status: DC | PRN
  Administered 2018-07-15: 25 mL via INTRAVENOUS

## 2018-07-15 MED ORDER — FENTANYL CITRATE (PF) 100 MCG/2ML IJ SOLN
25.0000 ug | INTRAMUSCULAR | Status: DC | PRN
  Administered 2018-07-15: 50 ug via INTRAVENOUS

## 2018-07-15 MED ORDER — MORPHINE SULFATE (PF) 4 MG/ML IV SOLN
INTRAVENOUS | Status: AC
  Filled 2018-07-15: qty 2

## 2018-07-15 MED ORDER — ONDANSETRON HCL 4 MG/2ML IJ SOLN
INTRAMUSCULAR | Status: AC
  Filled 2018-07-15: qty 2

## 2018-07-15 MED ORDER — CHLORHEXIDINE GLUCONATE 4 % EX LIQD: 60.0000 mL | Freq: Once | CUTANEOUS | Status: DC

## 2018-07-15 MED ORDER — PROPOFOL 500 MG/50ML IV EMUL
INTRAVENOUS | Status: DC | PRN
  Administered 2018-07-15: 50 ug/kg/min via INTRAVENOUS

## 2018-07-15 MED FILL — traMADol HCL 50 MG TABS: 50 | 9 days supply | Qty: 35 | Fill #0

## 2018-07-15 SURGICAL SUPPLY — 44 items
BANDAGE ACE 3X5.8 VEL STRL LF (GAUZE/BANDAGES/DRESSINGS) ×4 IMPLANT
BANDAGE ACE 4X5 VEL STRL LF (GAUZE/BANDAGES/DRESSINGS) ×2 IMPLANT
BNDG CMPR 9X4 STRL LF SNTH (GAUZE/BANDAGES/DRESSINGS)
BNDG ESMARK 4X9 LF (GAUZE/BANDAGES/DRESSINGS) IMPLANT
BNDG GAUZE ELAST 4 BULKY (GAUZE/BANDAGES/DRESSINGS) ×2 IMPLANT
CORDS BIPOLAR (ELECTRODE) ×2 IMPLANT
COVER SURGICAL LIGHT HANDLE (MISCELLANEOUS) ×2 IMPLANT
COVER WAND RF STERILE (DRAPES) ×2 IMPLANT
CUFF TOURNIQUET SINGLE 18IN (TOURNIQUET CUFF) ×2 IMPLANT
CUFF TOURNIQUET SINGLE 24IN (TOURNIQUET CUFF) IMPLANT
DRAPE SURG 17X23 STRL (DRAPES) ×2 IMPLANT
DURAPREP 26ML APPLICATOR (WOUND CARE) ×2 IMPLANT
GAUZE SPONGE 4X4 12PLY STRL (GAUZE/BANDAGES/DRESSINGS) ×2 IMPLANT
GAUZE XEROFORM 1X8 LF (GAUZE/BANDAGES/DRESSINGS) ×2 IMPLANT
GLOVE BIOGEL PI IND STRL 8 (GLOVE) ×1 IMPLANT
GLOVE BIOGEL PI INDICATOR 8 (GLOVE) ×1
GLOVE SURG ORTHO 8.0 STRL STRW (GLOVE) ×2 IMPLANT
GOWN STRL REUS W/ TWL LRG LVL3 (GOWN DISPOSABLE) ×2 IMPLANT
GOWN STRL REUS W/ TWL XL LVL3 (GOWN DISPOSABLE) ×1 IMPLANT
GOWN STRL REUS W/TWL LRG LVL3 (GOWN DISPOSABLE) ×4
GOWN STRL REUS W/TWL XL LVL3 (GOWN DISPOSABLE) ×2
KIT BASIN OR (CUSTOM PROCEDURE TRAY) ×2 IMPLANT
KIT TURNOVER KIT B (KITS) ×2 IMPLANT
LOOP VESSEL MAXI BLUE (MISCELLANEOUS) IMPLANT
NDL HYPO 25GX1X1/2 BEV (NEEDLE) IMPLANT
NEEDLE HYPO 25GX1X1/2 BEV (NEEDLE) IMPLANT
NS IRRIG 1000ML POUR BTL (IV SOLUTION) ×2 IMPLANT
PACK ORTHO EXTREMITY (CUSTOM PROCEDURE TRAY) ×2 IMPLANT
PAD ARMBOARD 7.5X6 YLW CONV (MISCELLANEOUS) ×4 IMPLANT
PAD CAST 4YDX4 CTTN HI CHSV (CAST SUPPLIES) ×2 IMPLANT
PADDING CAST COTTON 4X4 STRL (CAST SUPPLIES) ×4
SUCTION FRAZIER HANDLE 10FR (MISCELLANEOUS)
SUCTION TUBE FRAZIER 10FR DISP (MISCELLANEOUS) IMPLANT
SUT ETHILON 3 0 PS 1 (SUTURE) ×2 IMPLANT
SUT VIC AB 2-0 CT1 27 (SUTURE)
SUT VIC AB 2-0 CT1 TAPERPNT 27 (SUTURE) IMPLANT
SUT VIC AB 3-0 FS2 27 (SUTURE) IMPLANT
SYR CONTROL 10ML LL (SYRINGE) IMPLANT
SYSTEM CHEST DRAIN TLS 7FR (DRAIN) IMPLANT
TOWEL OR 17X24 6PK STRL BLUE (TOWEL DISPOSABLE) ×2 IMPLANT
TOWEL OR 17X26 10 PK STRL BLUE (TOWEL DISPOSABLE) ×2 IMPLANT
TUBE CONNECTING 12X1/4 (SUCTIONS) IMPLANT
UNDERPAD 30X30 (UNDERPADS AND DIAPERS) ×2 IMPLANT
WATER STERILE IRR 1000ML POUR (IV SOLUTION) ×2 IMPLANT

## 2018-07-15 NOTE — Brief Op Note (Signed)
07/15/2018  4:22 PM  PATIENT:  Andrea Montes  62 y.o. female  PRE-OPERATIVE DIAGNOSIS:  RIGHT CARPAL TUNNEL SYNDROME  POST-OPERATIVE DIAGNOSIS:  RIGHT CARPAL TUNNEL SYNDROME  PROCEDURE:  Procedure(s): RIGHT CARPAL TUNNEL RELEASE  SURGEON:  Surgeon(s): Marlou Sa, Tonna Corner, MD  ASSISTANT: C arla Bethune rnfra  ANESTHESIA:   regional  EBL: 4 ml    Total I/O In: 700 [I.V.:700] Out: 5 [Blood:5]  BLOOD ADMINISTERED: none  DRAINS: none   LOCAL MEDICATIONS USED:  Marcaine mso4 clonidine  SPECIMEN:  No Specimen  COUNTS:  YES  TOURNIQUET:   Total Tourniquet Time Documented: Upper Arm (Right) - 26 minutes Total: Upper Arm (Right) - 26 minutes   DICTATION: .Other Dictation: Dictation Number [id number]  PLAN OF CARE: Discharge to home after PACU  PATIENT DISPOSITION:  PACU - hemodynamically stable

## 2018-07-15 NOTE — Anesthesia Procedure Notes (Signed)
Procedure Name: MAC Date/Time: 07/15/2018 3:35 PM Performed by: Marsa Aris, CRNA Pre-anesthesia Checklist: Patient identified, Emergency Drugs available, Suction available, Patient being monitored and Timeout performed Patient Re-evaluated:Patient Re-evaluated prior to induction Oxygen Delivery Method: Simple face mask Preoxygenation: Pre-oxygenation with 100% oxygen

## 2018-07-15 NOTE — Anesthesia Procedure Notes (Signed)
Anesthesia Regional Block: Bier block (IV Regional)   Pre-Anesthetic Checklist: ,, timeout performed, Correct Patient, Correct Site, Correct Laterality, Correct Procedure, Correct Position, site marked, Risks and benefits discussed,  Surgical consent,  Pre-op evaluation,  At surgeon's request and post-op pain management  Laterality: Right     Needles:  Injection technique: Single-shot      Additional Needles:   Procedures:,,,,,, Esmarch exsanguination, single tourniquet utilized,  Narrative:  Start time: 07/15/2018 1:30 PM End time: 07/15/2018 1:40 PM Injection made incrementally with aspirations every 5 mL.  Performed by: Personally  Anesthesiologist: Roberts Gaudy, MD  Additional Notes: 25 cc 0.5% lidocaine plain injected easily

## 2018-07-15 NOTE — Transfer of Care (Signed)
Immediate Anesthesia Transfer of Care Note  Patient: Andrea Montes  Procedure(s) Performed: RIGHT CARPAL TUNNEL RELEASE (Right Hand)  Patient Location: PACU  Anesthesia Type:MAC  Level of Consciousness: awake, alert  and oriented  Airway & Oxygen Therapy: Patient Spontanous Breathing  Post-op Assessment: Report given to RN and Post -op Vital signs reviewed and stable  Post vital signs: Reviewed and stable  Last Vitals:  Vitals Value Taken Time  BP 119/67 07/15/2018  4:27 PM  Temp 36.2 C 07/15/2018  4:27 PM  Pulse 63 07/15/2018  4:30 PM  Resp 15 07/15/2018  4:30 PM  SpO2 94 % 07/15/2018  4:30 PM  Vitals shown include unvalidated device data.  Last Pain:  Vitals:   07/15/18 1334  PainSc: 0-No pain         Complications: No apparent anesthesia complications

## 2018-07-15 NOTE — Op Note (Signed)
NAME: [name], PULSE MEDICAL RECORD OE:[id number] ACCOUNT [id number] DATE OF BIRTH:11-Mar-1956 FACILITY: [facility] LOCATION: [facility] PHYSICIAN:Lional Icenogle Randel Pigg, MD  OPERATIVE REPORT  DATE OF PROCEDURE:  07/15/2018  PREOPERATIVE DIAGNOSIS:  Right carpal tunnel syndrome.  POSTOPERATIVE DIAGNOSIS:  Right carpal tunnel syndrome.  PROCEDURE:  Right carpal tunnel release.  SURGEON:  Meredith Pel, MD  ASSISTANT:  Laure Kidney, RNFA  INDICATIONS:  Andrea Montes is a patient with several-month history of right carpal tunnel syndrome proven by EMG nerve study refractory to nonoperative management.  She presents for operative management after explanation of risks and benefits.  PROCEDURE IN DETAIL:  The patient was brought to the operating room where IV regional anesthetic was induced.  Preoperative antibiotics administered.  Timeout was called.  Right hand was prescrubbed with alcohol and Betadine, allowed to air dry, prepped  with DuraPrep solution and draped in a sterile manner.  Charlie Pitter was not utilized.  An incision was made at the intersection of Kaplan's cardinal line in the proximal wrist flexion crease.  Skin and subcutaneous tissue were sharply divided.  Palmar fascia  was encountered and divided.  Self-retaining retractors were placed.  The transverse carpal ligament was visualized and divided in its midportion for 2-3 mm longitudinally.  A right angle retractor was placed between the median nerve and the transverse  carpal ligament, which was then released distally under direct visualization and proximally to the forearm fascia under direct visualization.  The nerve itself did appear to be compressed.  Motor branch intact.  No masses or cysts within the carpal  canal.  Thorough irrigation was performed.  Skin edges anesthetized using Marcaine, morphine, clonidine.  Tourniquet released.  Bleeding points encountered were controlled using bipolar electrocautery.  Skin closed using 3-0 simple  nylon sutures.  A  well-padded volar wrist splint applied.  The patient tolerated the procedure well without immediate complications and transferred to the recovery room in stable condition.  LN/NUANCE  D:07/15/2018 T:07/15/2018 JOB:[id number]

## 2018-07-15 NOTE — Anesthesia Preprocedure Evaluation (Addendum)
Anesthesia Evaluation  Patient identified by MRN, date of birth, ID band Patient awake    Reviewed: Allergy & Precautions, H&P , NPO status , Patient's Chart, lab work & pertinent test results  Airway Mallampati: II   Neck ROM: full    Dental  (+) Teeth Intact, Dental Advisory Given, Partial Upper   Pulmonary sleep apnea and Continuous Positive Airway Pressure Ventilation ,    breath sounds clear to auscultation       Cardiovascular hypertension, Pt. on medications negative cardio ROS   Rhythm:regular Rate:Normal     Neuro/Psych  Headaches, negative psych ROS   GI/Hepatic Neg liver ROS, GERD  ,  Endo/Other  Morbid obesity  Renal/GU negative Renal ROS  negative genitourinary   Musculoskeletal  (+) Arthritis , Osteoarthritis and Rheumatoid disorders,    Abdominal (+) + obese,   Peds  Hematology  (+) Blood dyscrasia, anemia ,   Anesthesia Other Findings Right carpal tunnel syndrome  Reproductive/Obstetrics                           Anesthesia Physical Anesthesia Plan  ASA: III  Anesthesia Plan: General   Post-op Pain Management:    Induction: Intravenous  PONV Risk Score and Plan: 3 and Ondansetron, Dexamethasone, Midazolam and Treatment may vary due to age or medical condition  Airway Management Planned: LMA  Additional Equipment:   Intra-op Plan:   Post-operative Plan: Extubation in OR  Informed Consent: I have reviewed the patients History and Physical, chart, labs and discussed the procedure including the risks, benefits and alternatives for the proposed anesthesia with the patient or authorized representative who has indicated his/her understanding and acceptance.   Dental advisory given  Plan Discussed with: CRNA, Anesthesiologist and Surgeon  Anesthesia Plan Comments: (Pt did not tolerate Bier block during her last procedure. )       Anesthesia Quick Evaluation

## 2018-07-15 NOTE — Anesthesia Postprocedure Evaluation (Signed)
Anesthesia Post Note  Patient: Andrea Montes  Procedure(s) Performed: RIGHT CARPAL TUNNEL RELEASE (Right Hand)     Patient location during evaluation: PACU Anesthesia Type: General Level of consciousness: awake and alert Pain management: pain level controlled Vital Signs Assessment: post-procedure vital signs reviewed and stable Respiratory status: spontaneous breathing, nonlabored ventilation, respiratory function stable and patient connected to nasal cannula oxygen Cardiovascular status: stable and blood pressure returned to baseline Postop Assessment: no apparent nausea or vomiting Anesthetic complications: no    Last Vitals:  Vitals:   07/15/18 1700 07/15/18 1715  BP: 114/73 110/65  Pulse: (!) 56 (!) 51  Resp: 11 12  Temp:  (!) 36.2 C  SpO2: 94% 94%    Last Pain:  Vitals:   07/15/18 1645  PainSc: 7                  Zeta Bucy COKER

## 2018-07-16 ENCOUNTER — Other Ambulatory Visit (INDEPENDENT_AMBULATORY_CARE_PROVIDER_SITE_OTHER): Payer: Self-pay

## 2018-07-16 ENCOUNTER — Telehealth (INDEPENDENT_AMBULATORY_CARE_PROVIDER_SITE_OTHER): Payer: Self-pay | Admitting: *Deleted

## 2018-07-16 ENCOUNTER — Encounter (HOSPITAL_COMMUNITY): Payer: Self-pay | Admitting: Orthopedic Surgery

## 2018-07-16 MED ORDER — OXYCODONE-ACETAMINOPHEN 5-325 MG PO TABS: ORAL_TABLET | ORAL | 0 refills | Status: DC

## 2018-07-16 MED FILL — OXYCODONE-ACETAMINOPHEN 5-3: 5-325 | 7 days supply | Qty: 30 | Fill #0

## 2018-07-16 NOTE — Telephone Encounter (Signed)
Please advise 

## 2018-07-16 NOTE — Telephone Encounter (Signed)
Rx ready for pick up at the front desk. Patient aware. 

## 2018-07-16 NOTE — Telephone Encounter (Signed)
Ok for oxy 1 po q 6 5 mg # 30 pls clal thx

## 2018-07-23 ENCOUNTER — Ambulatory Visit (INDEPENDENT_AMBULATORY_CARE_PROVIDER_SITE_OTHER): Payer: 59 | Admitting: Orthopedic Surgery

## 2018-07-23 ENCOUNTER — Ambulatory Visit (INDEPENDENT_AMBULATORY_CARE_PROVIDER_SITE_OTHER): Payer: 59 | Admitting: Family Medicine

## 2018-07-23 ENCOUNTER — Encounter (INDEPENDENT_AMBULATORY_CARE_PROVIDER_SITE_OTHER): Payer: Self-pay | Admitting: Orthopedic Surgery

## 2018-07-23 VITALS — BP 145/81 | HR 69 | Temp 97.9°F | Ht 63.0 in | Wt 261.0 lb

## 2018-07-23 DIAGNOSIS — E559 Vitamin D deficiency, unspecified: Secondary | ICD-10-CM

## 2018-07-23 DIAGNOSIS — E1165 Type 2 diabetes mellitus with hyperglycemia: Secondary | ICD-10-CM

## 2018-07-23 DIAGNOSIS — Z9189 Other specified personal risk factors, not elsewhere classified: Secondary | ICD-10-CM | POA: Diagnosis not present

## 2018-07-23 DIAGNOSIS — G5601 Carpal tunnel syndrome, right upper limb: Secondary | ICD-10-CM

## 2018-07-23 DIAGNOSIS — Z6841 Body Mass Index (BMI) 40.0 and over, adult: Secondary | ICD-10-CM | POA: Diagnosis not present

## 2018-07-23 MED ORDER — VITAMIN D (ERGOCALCIFEROL) 1.25 MG (50000 UNIT) PO CAPS: 50000.0000 [IU] | ORAL_CAPSULE | ORAL | 0 refills | Status: DC

## 2018-07-23 MED ORDER — METFORMIN HCL 500 MG PO TABS: 500.0000 mg | ORAL_TABLET | Freq: Every day | ORAL | 0 refills | Status: DC

## 2018-07-23 MED FILL — VIT D2 1.25 MG (50,000 UNIT: 1.25 MG | 28 days supply | Qty: 4 | Fill #0

## 2018-07-23 MED FILL — metFORMIN HCL 500 MG TABS: 500 | 90 days supply | Qty: 90 | Fill #0

## 2018-07-23 NOTE — Progress Notes (Signed)
Post-Op Visit Note   Patient: Andrea Montes           Date of Birth: 1956-03-06           MRN: [id number] Visit Date: 07/23/2018 PCP: Seward Carol, MD   Assessment & Plan:  Chief Complaint:  Chief Complaint  Patient presents with  . Right Wrist - Pain   Visit Diagnoses:  1. Carpal tunnel syndrome, right upper limb     Plan: Andrea Montes is a patient is now 8 Days Out Right Carpal Tunl. release.  She has a little numbness in her thumb but otherwise doing well.  Taking oxycodone and Ultram.  On examination the incisions intact and abductor pollicis brevis strength is intact she has been making good progress out of the splint.  She would like to get back to work tomorrow.  Plan is to come back on Monday for suture removal.  Follow-Up Instructions: No follow-ups on file.   Orders:  No orders of the defined types were placed in this encounter.  No orders of the defined types were placed in this encounter.   Imaging: No results found.  PMFS History: Patient Active Problem List   Diagnosis Date Noted  . Trigger finger, acquired 09/09/2015  . Low back pain radiating to both legs 05/06/2012  . Leg length discrepancy 05/06/2012  . HYPERTENSION, BENIGN 04/11/2009  . LEG EDEMA, BILATERAL 10/08/2008  . 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 OVARIAN FAILURE 09/02/2007  . VITAMIN D DEFICIENCY 09/02/2007  . OBESITY, NOS 10/17/2006  . ANEMIA, IRON DEFICIENCY, UNSPEC. 10/17/2006  . Carpal tunnel syndrome, right upper limb 10/17/2006   Past Medical History:  Diagnosis Date  . Anemia    no current med.  [name] Apnea   . 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 Back pain   . Carpal tunnel syndrome   . Complication of anesthesia    itching from last surgery that was completed at the surgery center off of [address] patient was given benedryl, generalized itching and they are not sure where that came from  . DDD (degenerative disc disease),  lumbar   . Dental crowns present   . Edema of lower extremity    bilateral  . Frozen shoulder    surgery 11-04-14  . GERD (gastroesophageal reflux disease)    no current med.  [name] GERD (gastroesophageal reflux disease)   . Heart murmur    last  echo- 2010  . History of blood transfusion   . History of shingles   . Hypertension    borderline not on medicaiton at this time  . Impingement syndrome of left shoulder region 10/2014  . Joint pain   . Migraines   . Obesity   . Pre-diabetes   . Rheumatoid arteritis ([place])   . Seasonal allergies   . Sleep apnea    uses CPAP nightly  . Trigger finger, left middle finger   . Vitamin D deficiency   . Wears partial dentures    upper    Family History  Problem Relation Age of Onset  . Kidney disease Brother   . Hypertension Brother   . Leukemia Mother   . Heart disease Mother   . Stroke Mother   . Cancer Mother   . Depression Mother   . Anxiety disorder Mother   . AAA (abdominal aortic aneurysm) Father   . Esophageal cancer Maternal Uncle   . Hypertension Brother   . Colon cancer  Neg Hx   . Colon polyps Neg Hx   . Rectal cancer Neg Hx   . Stomach cancer Neg Hx     Past Surgical History:  Procedure Laterality Date  . CARPAL TUNNEL RELEASE Left 04/25/2006  . CARPAL TUNNEL RELEASE Right 07/15/2018   Procedure: RIGHT CARPAL TUNNEL RELEASE;  Surgeon: Meredith Pel, MD;  Location: [facility];  Service: Orthopedics;  Laterality: Right;  . CLOSED MANIPULATION SHOULDER WITH STERIOD INJECTION Left 02/03/2015   Procedure: LEFT  MANIPULATION SHOULDER UNDER ANESTHESIA WITH INJECTION OF STEROID;  Surgeon: Kathryne Hitch, MD;  Location: [facility];  Service: Orthopedics;  Laterality: Left;  . COLONOSCOPY    . COLONOSCOPY WITH PROPOFOL  10/09/2011  . POLYPECTOMY    . RESECTION DISTAL CLAVICAL Left 11/04/2014   Procedure: RESECTION DISTAL CLAVICAL;  Surgeon: Kathryne Hitch, MD;  Location: [facility];  Service:  Orthopedics;  Laterality: Left;  . ROTATOR CUFF REPAIR  2010  . SHOULDER ARTHROSCOPY WITH ROTATOR CUFF REPAIR Right 10/04/2008  . SHOULDER ARTHROSCOPY WITH SUBACROMIAL DECOMPRESSION, ROTATOR CUFF REPAIR AND BICEP TENDON REPAIR Left 11/04/2014   Procedure: LEFT SHOULDER ARTHROSCOPY WITH DEBRIDEMENT, DISTAL CLAVICLE EXCISION, ACROMIOPLASTY, ROTATOR CUFF REPAIR AND BICEP TENODESIS;  Surgeon: Kathryne Hitch, MD;  Location: [facility];  Service: Orthopedics;  Laterality: Left;  . TRIGGER FINGER RELEASE Right 02/03/2015   Procedure: RIGHT LONG FINGER TRIGGER RELEASE;  Surgeon: Kathryne Hitch, MD;  Location: [facility];  Service: Orthopedics;  Laterality: Right;  . TRIGGER FINGER RELEASE Left 10/10/2017   Procedure: LEFT HAND RELEASE TRIGGER FINGER 3RD LONG FINGER AND CYST EXCISION;  Surgeon: Meredith Pel, MD;  Location: [facility];  Service: Orthopedics;  Laterality: Left;  . TUBAL LIGATION    . VAGINAL HYSTERECTOMY  2001   partial   Social History   Occupational History  . Not on file  Tobacco Use  . Smoking status: Never Smoker  . Smokeless tobacco: Never Used  Substance and Sexual Activity  . Alcohol use: No  . Drug use: No  . Sexual activity: Not on file

## 2018-07-28 ENCOUNTER — Ambulatory Visit (INDEPENDENT_AMBULATORY_CARE_PROVIDER_SITE_OTHER): Payer: 59 | Admitting: Orthopedic Surgery

## 2018-07-28 DIAGNOSIS — G5601 Carpal tunnel syndrome, right upper limb: Secondary | ICD-10-CM

## 2018-07-28 NOTE — Progress Notes (Signed)
Office: [phone]  /  Fax: [phone]   HPI:   Chief Complaint: OBESITY Andrea Montes is here to discuss her progress with her obesity treatment plan. She is on the keep a food journal with 350-400 calories and 35+ grams of protein at lunch daily and follow the Category 3 plan and is following her eating plan approximately 90 % of the time. She states she is exercising 0 minutes 0 times per week. Andrea Montes has been journaling for lunch and says she has to be more diligent. She is alternating between all options in meal plan. She denies hunger and she is using popcorn for snacks. She has Christmas parties coming up.  Her weight is 261 lb (118.4 kg) today and has gained 1 pound since her last visit. She has lost 9 lbs since starting treatment with [name].  Vitamin D Deficiency Andrea Montes has a diagnosis of vitamin D deficiency. She is currently taking prescription Vit D. She notes improving fatigue and denies nausea, vomiting or muscle weakness.  At risk for osteopenia and osteoporosis Andrea Montes is at higher risk of osteopenia and osteoporosis due to vitamin D deficiency.   Diabetes II with Hyperglycemia Andrea Montes has a diagnosis of diabetes type II. Andrea Montes is ACE, statin, and metformin, and she denies GI side effects of metformin. She denies hypoglycemia . Last A1c was 6.3. She has been working on intensive lifestyle modifications including diet, exercise, and weight loss to help control her blood glucose levels.  ALLERGIES: Allergies  Allergen Reactions  . Kiwi Extract Itching and Other (See Comments)    Itching in mouth with KIWI, And pineapple juice- topically irritating     MEDICATIONS: Current Outpatient Medications on File Prior to Visit  Medication Sig Dispense Refill  . Aspirin-Acetaminophen-Caffeine (GOODY HEADACHE PO) Take 1 Package by mouth as needed (pain).    Andrea Montes Kitchen atorvastatin (LIPITOR) 10 MG tablet Take 1 tablet (10 mg total) by mouth at bedtime. 30 tablet 0  . bacitracin 500 UNIT/GM  ointment Apply 1 application topically 2 (two) times daily. 15 g 0  . calcium carbonate (TUMS EX) 750 MG chewable tablet Chew 1-2 tablets by mouth 2 (two) times daily as needed for heartburn.    . furosemide (LASIX) 20 MG tablet Take 1 tablet (20 mg total) by mouth daily. (Patient taking differently: Take 20 mg by mouth daily as needed for fluid. ) 30 tablet 11  . lisinopril (ZESTRIL) 2.5 MG tablet Take 1 tablet (2.5 mg total) by mouth daily. (Patient taking differently: Take 2.5 mg by mouth daily after breakfast. ) 30 tablet 0  . meloxicam (MOBIC) 15 MG tablet Take 1 tablet (15 mg total) by mouth daily as needed for pain. (Patient taking differently: Take 15 mg by mouth daily. ) 60 tablet 1  . montelukast (SINGULAIR) 10 MG tablet Take 1 tablet (10 mg total) by mouth at bedtime. 30 tablet 0  . oxyCODONE-acetaminophen (PERCOCET/ROXICET) 5-325 MG tablet Take  1 tab  po q 6 hours prn pain 30 tablet 0  . phenazopyridine (PYRIDIUM) 100 MG tablet Take 1 tablet (100 mg total) by mouth 3 (three) times daily as needed for pain. 10 tablet 0  . SUMAtriptan (IMITREX) 25 MG tablet Take 25 mg by mouth every 2 (two) hours as needed for migraine or headache.   1  . traMADol (ULTRAM) 50 MG tablet Take 1 tablet (50 mg total) by mouth every 6 (six) hours as needed. (Patient taking differently: Take 50 mg by mouth every 6 (six) hours as needed (  for pain.). ) 60 tablet 2  . albuterol (PROVENTIL HFA;VENTOLIN HFA) 108 (90 Base) MCG/ACT inhaler Inhale 2 puffs into the lungs every 6 (six) hours as needed for up to 10 days for wheezing or shortness of breath. 1 Inhaler 0   Current Facility-Administered Medications on File Prior to Visit  Medication Dose Route Frequency Provider Last Rate Last Dose  . 0.9 %  sodium chloride infusion  500 mL Intravenous Continuous Nandigam, Venia Minks, MD        PAST MEDICAL HISTORY: Past Medical History:  Diagnosis Date  . Anemia    no current med.  Andrea Montes Kitchen Apnea   . Articular cartilage  disorder of left shoulder region 10/2014  . Back pain   . Carpal tunnel syndrome   . Complication of anesthesia    itching from last surgery that was completed at the surgery center off of [address] patient was given benedryl, generalized itching and they are not sure where that came from  . DDD (degenerative disc disease), lumbar   . Dental crowns present   . Edema of lower extremity    bilateral  . Frozen shoulder    surgery 11-04-14  . GERD (gastroesophageal reflux disease)    no current med.  Andrea Montes Kitchen GERD (gastroesophageal reflux disease)   . Heart murmur    last  echo- 2010  . History of blood transfusion   . History of shingles   . Hypertension    borderline not on medicaiton at this time  . Impingement syndrome of left shoulder region 10/2014  . Joint pain   . Migraines   . Obesity   . Pre-diabetes   . Rheumatoid arteritis ([place])   . Seasonal allergies   . Sleep apnea    uses CPAP nightly  . Trigger finger, left middle finger   . Vitamin D deficiency   . Wears partial dentures    upper    PAST SURGICAL HISTORY: Past Surgical History:  Procedure Laterality Date  . CARPAL TUNNEL RELEASE Left 04/25/2006  . CARPAL TUNNEL RELEASE Right 07/15/2018   Procedure: RIGHT CARPAL TUNNEL RELEASE;  Surgeon: Meredith Pel, MD;  Location: [facility];  Service: Orthopedics;  Laterality: Right;  . CLOSED MANIPULATION SHOULDER WITH STERIOD INJECTION Left 02/03/2015   Procedure: LEFT  MANIPULATION SHOULDER UNDER ANESTHESIA WITH INJECTION OF STEROID;  Surgeon: Kathryne Hitch, MD;  Location: [facility];  Service: Orthopedics;  Laterality: Left;  . COLONOSCOPY    . COLONOSCOPY WITH PROPOFOL  10/09/2011  . POLYPECTOMY    . RESECTION DISTAL CLAVICAL Left 11/04/2014   Procedure: RESECTION DISTAL CLAVICAL;  Surgeon: Kathryne Hitch, MD;  Location: [facility];  Service: Orthopedics;  Laterality: Left;  . ROTATOR CUFF REPAIR  2010  . SHOULDER ARTHROSCOPY WITH ROTATOR CUFF  REPAIR Right 10/04/2008  . SHOULDER ARTHROSCOPY WITH SUBACROMIAL DECOMPRESSION, ROTATOR CUFF REPAIR AND BICEP TENDON REPAIR Left 11/04/2014   Procedure: LEFT SHOULDER ARTHROSCOPY WITH DEBRIDEMENT, DISTAL CLAVICLE EXCISION, ACROMIOPLASTY, ROTATOR CUFF REPAIR AND BICEP TENODESIS;  Surgeon: Kathryne Hitch, MD;  Location: [facility];  Service: Orthopedics;  Laterality: Left;  . TRIGGER FINGER RELEASE Right 02/03/2015   Procedure: RIGHT LONG FINGER TRIGGER RELEASE;  Surgeon: Kathryne Hitch, MD;  Location: [facility];  Service: Orthopedics;  Laterality: Right;  . TRIGGER FINGER RELEASE Left 10/10/2017   Procedure: LEFT HAND RELEASE TRIGGER FINGER 3RD LONG FINGER AND CYST EXCISION;  Surgeon: Meredith Pel, MD;  Location: [facility];  Service: Orthopedics;  Laterality: Left;  . TUBAL LIGATION    . VAGINAL HYSTERECTOMY  2001   partial    SOCIAL HISTORY: Social History   Tobacco Use  . Smoking status: Never Smoker  . Smokeless tobacco: Never Used  Substance Use Topics  . Alcohol use: No  . Drug use: No    FAMILY HISTORY: Family History  Problem Relation Age of Onset  . Kidney disease Brother   . Hypertension Brother   . Leukemia Mother   . Heart disease Mother   . Stroke Mother   . Cancer Mother   . Depression Mother   . Anxiety disorder Mother   . AAA (abdominal aortic aneurysm) Father   . Esophageal cancer Maternal Uncle   . Hypertension Brother   . Colon cancer Neg Hx   . Colon polyps Neg Hx   . Rectal cancer Neg Hx   . Stomach cancer Neg Hx     ROS: Review of Systems  Constitutional: Positive for malaise/fatigue. Negative for weight loss.  Gastrointestinal: Negative for nausea and vomiting.  Musculoskeletal:       Negative muscle weakness  Endo/Heme/Allergies:       Negative hypoglycemia    PHYSICAL EXAM: Blood pressure (!) 145/81, pulse 69, temperature 97.9 F (36.6 C), temperature source Oral, height 5\' 3"  (1.6 m), weight 261 lb (118.4 kg),  SpO2 96 %. Body mass index is 46.23 kg/m. Physical Exam  Constitutional: She is oriented to person, place, and time. She appears well-developed and well-nourished.  Cardiovascular: Normal rate.  Pulmonary/Chest: Effort normal.  Musculoskeletal: Normal range of motion.  Neurological: She is oriented to person, place, and time.  Skin: Skin is warm and dry.  Psychiatric: She has a normal mood and affect. Her behavior is normal.  Vitals reviewed.   RECENT LABS AND TESTS: BMET    Component Value Date/Time   NA 140 07/09/2018 0925   NA 139 05/07/2018 1424   K 4.2 07/09/2018 0925   CL 108 07/09/2018 0925   CO2 25 07/09/2018 0925   GLUCOSE 124 (H) 07/09/2018 0925   BUN 10 07/09/2018 0925   BUN 10 05/07/2018 1424   CREATININE 0.85 07/09/2018 0925   CREATININE 0.85 03/07/2016 1654   CALCIUM 9.0 07/09/2018 0925   GFRNONAA >60 07/09/2018 0925   GFRAA >60 07/09/2018 0925   Lab Results  Component Value Date   HGBA1C 6.3 (H) 07/09/2018   HGBA1C 6.7 (H) 05/07/2018   Lab Results  Component Value Date   INSULIN 39.8 (H) 05/07/2018   CBC    Component Value Date/Time   WBC 7.5 07/09/2018 0925   RBC 6.24 (H) 07/09/2018 0925   HGB 13.1 07/09/2018 0925   HGB 11.8 05/07/2018 1424   HCT 44.8 07/09/2018 0925   HCT 36.9 05/07/2018 1424   PLT 270 07/09/2018 0925   MCV 71.8 (L) 07/09/2018 0925   MCV 69 (L) 05/07/2018 1424   MCH 21.0 (L) 07/09/2018 0925   MCHC 29.2 (L) 07/09/2018 0925   RDW 15.1 07/09/2018 0925   RDW 16.4 (H) 05/07/2018 1424   LYMPHSABS 2.9 05/07/2018 1424   MONOABS 0.5 07/14/2017 2115   EOSABS 0.1 05/07/2018 1424   BASOSABS 0.0 05/07/2018 1424   Iron/TIBC/Ferritin/ %Sat No results found for: IRON, TIBC, FERRITIN, IRONPCTSAT Lipid Panel     Component Value Date/Time   CHOL 130 05/07/2018 1424   TRIG 89 05/07/2018 1424   HDL 35 (L) 05/07/2018 1424   CHOLHDL 4.1 Ratio 09/26/2009 1823   VLDL 26 09/26/2009  1823   LDLCALC 77 05/07/2018 1424   Hepatic Function  Panel     Component Value Date/Time   PROT 6.9 05/07/2018 1424   ALBUMIN 3.7 05/07/2018 1424   AST 17 05/07/2018 1424   ALT 6 05/07/2018 1424   ALKPHOS 93 05/07/2018 1424   BILITOT 0.2 05/07/2018 1424      Component Value Date/Time   TSH 1.470 05/07/2018 1424   TSH 1.55 11/25/2008 1700   Results for TAYLORMARIE, REGISTER (MRN [id number]) as of 07/28/2018 16:33  Ref. Range 05/07/2018 14:24  Vitamin D, 25-Hydroxy Latest Ref Range: 30.0 - 100.0 ng/mL 14.4 (L)   ASSESSMENT AND PLAN: Vitamin D deficiency - Plan: Vitamin D, Ergocalciferol, (DRISDOL) 1.25 MG (50000 UT) CAPS capsule  Type 2 diabetes mellitus with hyperglycemia, without long-term current use of insulin (HCC) - Plan: metFORMIN (GLUCOPHAGE) 500 MG tablet  At risk for osteoporosis  Class 3 severe obesity with serious comorbidity and body mass index (BMI) of 45.0 to 49.9 in adult, unspecified obesity type ([place])  PLAN:  Vitamin D Deficiency Andrea Montes was informed that low vitamin D levels contributes to fatigue and are associated with obesity, breast, and colon cancer. Andrea Montes agrees to continue taking prescription Vit D @50 ,000 IU every week, no refill needed. She will follow up for routine testing of vitamin D, at least 2-3 times per year. She was informed of the risk of over-replacement of vitamin D and agrees to not increase her dose unless she discusses this with [name] first. Andrea Montes agrees to follow up with our clinic in 2 weeks.  At risk for osteopenia and osteoporosis Andrea Montes was given extended (15 minutes) osteoporosis prevention counseling today. Andrea Montes is at risk for osteopenia and osteoporsis due to her vitamin D deficiency. She was encouraged to take her vitamin D and follow her higher calcium diet and increase strengthening exercise to help strengthen her bones and decrease her risk of osteopenia and osteoporosis.  Diabetes II with Hyperglycemia Andrea Montes has been given extensive diabetes education by myself today including ideal  fasting and post-prandial blood glucose readings, individual ideal Hgb A1c goals and hypoglycemia prevention. We discussed the importance of good blood sugar control to decrease the likelihood of diabetic complications such as nephropathy, neuropathy, limb loss, blindness, coronary artery disease, and death. We discussed the importance of intensive lifestyle modification including diet, exercise and weight loss as the first line treatment for diabetes. Andrea Montes agrees to continue taking metformin 500 mg PO q AM #90 and we will refill for 1 month. Andrea Montes agrees to follow up with our clinic in 2 weeks.  Obesity Andrea Montes is currently in the action stage of change. As such, her goal is to continue with weight loss efforts She has agreed to keep a food journal with 350-400 calories and 35+ grams of protein at lunch daily and follow the Category 3 plan Andrea Montes has been instructed to work up to a goal of 150 minutes of combined cardio and strengthening exercise per week for weight loss and overall health benefits. We discussed the following Behavioral Modification Strategies today: increasing lean protein intake, increasing vegetables, work on meal planning and easy cooking plans, holiday eating strategies, planning for success, and keep a strict food journal   Andrea Montes has agreed to follow up with our clinic in 2 weeks. She was informed of the importance of frequent follow up visits to maximize her success with intensive lifestyle modifications for her multiple health conditions.   OBESITY BEHAVIORAL INTERVENTION VISIT  Today's visit was #  6   Starting weight: 270 lbs Starting date: 05/07/18 Today's weight : 261 lbs Today's date: 07/23/2018 Total lbs lost to date: 9    ASK: We discussed the diagnosis of obesity with Andrea Montes today and Andrea Montes agreed to give [name] permission to discuss obesity behavioral modification therapy today.  ASSESS: Andrea Montes has the diagnosis of obesity and her BMI today is  46.25 Andrea Montes is in the action stage of change   ADVISE: Andrea Montes was educated on the multiple health risks of obesity as well as the benefit of weight loss to improve her health. She was advised of the need for long term treatment and the importance of lifestyle modifications to improve her current health and to decrease her risk of future health problems.  AGREE: Multiple dietary modification options and treatment options were discussed and  Andrea Montes agreed to follow the recommendations documented in the above note.  ARRANGE: Andrea Montes was educated on the importance of frequent visits to treat obesity as outlined per CMS and USPSTF guidelines and agreed to schedule her next follow up appointment today.  I, Ravon Mcilhenny, am acting as transcriptionist for Ilene Qua, MD  I have reviewed the above documentation for accuracy and completeness, and I agree with the above. - Ilene Qua, MD

## 2018-07-30 ENCOUNTER — Encounter (INDEPENDENT_AMBULATORY_CARE_PROVIDER_SITE_OTHER): Payer: Self-pay | Admitting: Orthopedic Surgery

## 2018-07-30 NOTE — Progress Notes (Signed)
Post-Op Visit Note   Patient: Andrea Montes           Date of Birth: 07-13-1956           MRN: [id number] Visit Date: 07/28/2018 PCP: Seward Carol, MD   Assessment & Plan:  Chief Complaint:  Chief Complaint  Patient presents with  . Right Hand - Routine Post Op    Suture removal   Visit Diagnoses:  1. Carpal tunnel syndrome, right upper limb     Plan: Ulah is a patient with carpal tunnel release performed 07/15/2018.  She is doing well.  She is back at work.  Incision is intact and sutures are removed.  No heavy lifting over 5 pounds for the next 4 weeks but okay to otherwise do activities of daily living.  Her numbness is improved.  I will see her back as needed  Follow-Up Instructions: Return if symptoms worsen or fail to improve.   Orders:  No orders of the defined types were placed in this encounter.  No orders of the defined types were placed in this encounter.   Imaging: No results found.  PMFS History: Patient Active Problem List   Diagnosis Date Noted  . Trigger finger, acquired 09/09/2015  . Low back pain radiating to both legs 05/06/2012  . Leg length discrepancy 05/06/2012  . HYPERTENSION, BENIGN 04/11/2009  . LEG EDEMA, BILATERAL 10/08/2008  . 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 OVARIAN FAILURE 09/02/2007  . VITAMIN D DEFICIENCY 09/02/2007  . OBESITY, NOS 10/17/2006  . ANEMIA, IRON DEFICIENCY, UNSPEC. 10/17/2006  . Carpal tunnel syndrome, right upper limb 10/17/2006   Past Medical History:  Diagnosis Date  . Anemia    no current med.  [name] Apnea   . 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 Back pain   . Carpal tunnel syndrome   . Complication of anesthesia    itching from last surgery that was completed at the surgery center off of [address] patient was given benedryl, generalized itching and they are not sure where that came from  . DDD (degenerative disc disease), lumbar   . Dental crowns  present   . Edema of lower extremity    bilateral  . Frozen shoulder    surgery 11-04-14  . GERD (gastroesophageal reflux disease)    no current med.  [name] GERD (gastroesophageal reflux disease)   . Heart murmur    last  echo- 2010  . History of blood transfusion   . History of shingles   . Hypertension    borderline not on medicaiton at this time  . Impingement syndrome of left shoulder region 10/2014  . Joint pain   . Migraines   . Obesity   . Pre-diabetes   . Rheumatoid arteritis ([place])   . Seasonal allergies   . Sleep apnea    uses CPAP nightly  . Trigger finger, left middle finger   . Vitamin D deficiency   . Wears partial dentures    upper    Family History  Problem Relation Age of Onset  . Kidney disease Brother   . Hypertension Brother   . Leukemia Mother   . Heart disease Mother   . Stroke Mother   . Cancer Mother   . Depression Mother   . Anxiety disorder Mother   . AAA (abdominal aortic aneurysm) Father   . Esophageal cancer Maternal Uncle   . Hypertension Brother   . Colon cancer Neg  Hx   . Colon polyps Neg Hx   . Rectal cancer Neg Hx   . Stomach cancer Neg Hx     Past Surgical History:  Procedure Laterality Date  . CARPAL TUNNEL RELEASE Left 04/25/2006  . CARPAL TUNNEL RELEASE Right 07/15/2018   Procedure: RIGHT CARPAL TUNNEL RELEASE;  Surgeon: Meredith Pel, MD;  Location: [place];  Service: Orthopedics;  Laterality: Right;  . CLOSED MANIPULATION SHOULDER WITH STERIOD INJECTION Left 02/03/2015   Procedure: LEFT  MANIPULATION SHOULDER UNDER ANESTHESIA WITH INJECTION OF STEROID;  Surgeon: Kathryne Hitch, MD;  Location: [facility];  Service: Orthopedics;  Laterality: Left;  . COLONOSCOPY    . COLONOSCOPY WITH PROPOFOL  10/09/2011  . POLYPECTOMY    . RESECTION DISTAL CLAVICAL Left 11/04/2014   Procedure: RESECTION DISTAL CLAVICAL;  Surgeon: Kathryne Hitch, MD;  Location: [place];  Service: Orthopedics;  Laterality: Left;  .  ROTATOR CUFF REPAIR  2010  . SHOULDER ARTHROSCOPY WITH ROTATOR CUFF REPAIR Right 10/04/2008  . SHOULDER ARTHROSCOPY WITH SUBACROMIAL DECOMPRESSION, ROTATOR CUFF REPAIR AND BICEP TENDON REPAIR Left 11/04/2014   Procedure: LEFT SHOULDER ARTHROSCOPY WITH DEBRIDEMENT, DISTAL CLAVICLE EXCISION, ACROMIOPLASTY, ROTATOR CUFF REPAIR AND BICEP TENODESIS;  Surgeon: Kathryne Hitch, MD;  Location: [facility];  Service: Orthopedics;  Laterality: Left;  . TRIGGER FINGER RELEASE Right 02/03/2015   Procedure: RIGHT LONG FINGER TRIGGER RELEASE;  Surgeon: Kathryne Hitch, MD;  Location: [place];  Service: Orthopedics;  Laterality: Right;  . TRIGGER FINGER RELEASE Left 10/10/2017   Procedure: LEFT HAND RELEASE TRIGGER FINGER 3RD LONG FINGER AND CYST EXCISION;  Surgeon: Meredith Pel, MD;  Location: [facility];  Service: Orthopedics;  Laterality: Left;  . TUBAL LIGATION    . VAGINAL HYSTERECTOMY  2001   partial   Social History   Occupational History  . Not on file  Tobacco Use  . Smoking status: Never Smoker  . Smokeless tobacco: Never Used  Substance and Sexual Activity  . Alcohol use: No  . Drug use: No  . Sexual activity: Not on file

## 2018-08-07 ENCOUNTER — Ambulatory Visit (INDEPENDENT_AMBULATORY_CARE_PROVIDER_SITE_OTHER): Payer: 59 | Admitting: Family Medicine

## 2018-08-07 ENCOUNTER — Encounter (INDEPENDENT_AMBULATORY_CARE_PROVIDER_SITE_OTHER): Payer: Self-pay
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
## 2018-08-11 ENCOUNTER — Ambulatory Visit (INDEPENDENT_AMBULATORY_CARE_PROVIDER_SITE_OTHER): Payer: 59 | Admitting: Cardiovascular Disease

## 2018-08-11 VITALS — BP 128/82 | HR 72 | Ht 63.0 in | Wt 263.0 lb

## 2018-08-11 DIAGNOSIS — R0600 Dyspnea, unspecified: Secondary | ICD-10-CM | POA: Diagnosis not present

## 2018-08-11 NOTE — Patient Instructions (Signed)
Medication Instructions:   If you need a refill on your cardiac medications before your next appointment, please call your pharmacy.   Lab work:  If you have labs (blood work) drawn today and your tests are completely normal, you will receive your results only by: [name] MyChart Message (if you have MyChart) OR . A paper copy in the mail If you have any lab test that is abnormal or we need to change your treatment, we will call you to review the results.  Testing/Procedures: NONE ordered today.  Follow-Up: At [facility], you and your health needs are our priority.  As part of our continuing mission to provide you with exceptional heart care, we have created designated Provider Care Teams.  These Care Teams include your primary Cardiologist (physician) and Advanced Practice Providers (APPs -  Physician Assistants and Nurse Practitioners) who all work together to provide you with the care you need, when you need it. Your physician recommends that you schedule a follow-up appointment as needed with Dr. Johnsie Cancel.

## 2018-08-28 ENCOUNTER — Ambulatory Visit (INDEPENDENT_AMBULATORY_CARE_PROVIDER_SITE_OTHER): Payer: 59 | Admitting: Bariatrics

## 2018-08-28 ENCOUNTER — Encounter (INDEPENDENT_AMBULATORY_CARE_PROVIDER_SITE_OTHER): Payer: Self-pay | Admitting: Bariatrics

## 2018-08-28 VITALS — BP 145/80 | HR 75 | Temp 98.4°F | Ht 63.0 in | Wt 260.0 lb

## 2018-08-28 DIAGNOSIS — Z6841 Body Mass Index (BMI) 40.0 and over, adult: Secondary | ICD-10-CM

## 2018-08-28 DIAGNOSIS — E119 Type 2 diabetes mellitus without complications: Secondary | ICD-10-CM

## 2018-08-28 DIAGNOSIS — E66813 Obesity, class 3: Secondary | ICD-10-CM

## 2018-08-28 DIAGNOSIS — Z9189 Other specified personal risk factors, not elsewhere classified: Secondary | ICD-10-CM | POA: Diagnosis not present

## 2018-08-28 DIAGNOSIS — I10 Essential (primary) hypertension: Secondary | ICD-10-CM | POA: Diagnosis not present

## 2018-08-28 MED ORDER — LISINOPRIL 2.5 MG PO TABS: 2.5000 mg | ORAL_TABLET | Freq: Every day | ORAL | 0 refills | Status: DC

## 2018-08-28 MED FILL — LISINOPRIL 2.5 MG TABLET: 2.5 | 30 days supply | Qty: 30 | Fill #0

## 2018-08-28 NOTE — Progress Notes (Signed)
Office: [phone]  /  Fax: [phone]   HPI:   Chief Complaint: OBESITY Andrea Montes is here to discuss her progress with her obesity treatment plan. She is on the keep a food journal with 350 to 400 calories and 35+ grams of protein at lunch daily and the Category 3 plan and is following her eating plan approximately 60 % of the time. She states she is exercising 0 minutes 0 times per week. Andrea Montes is doing well overall. She stayed away from sweets. Andrea Montes denied any struggles. Her hunger is controlled. Her weight is 260 lb (117.9 kg) today and has had a weight loss of 1 pound over a period of 5 weeks since her last visit. She has lost 10 lbs since starting treatment with [name].  Hypertension Andrea Montes is a 63 y.o. female with hypertension. She is taking Lisinopril. Andrea Montes denies chest pain or shortness of breath on exertion. She is working weight loss to help control her blood pressure with the goal of decreasing her risk of heart attack and stroke. Sharons blood pressure is reasonably well controlled.  Diabetes II with hyperglycemia Andrea Montes has a diagnosis of diabetes type II. Andrea Montes is taking metformin, ACE and statin. Last A1c was at 6.3 and last insulin level was at 39.8 She has been working on intensive lifestyle modifications including diet, exercise, and weight loss to help control her blood glucose levels.  At risk for cardiovascular disease Andrea Montes is at a higher than average risk for cardiovascular disease due to obesity, hypertension and diabetes. She currently denies any chest pain.  ASSESSMENT AND PLAN:  Type 2 diabetes mellitus without complication, without long-term current use of insulin (HCC) - Plan: lisinopril (ZESTRIL) 2.5 MG tablet  Essential hypertension  At risk for heart disease  Class 3 severe obesity with serious comorbidity and body mass index (BMI) of 45.0 to 49.9 in adult, unspecified obesity type ([place])  PLAN:  Hypertension We discussed  sodium restriction, working on healthy weight loss, and a regular exercise program as the means to achieve improved blood pressure control. Men[place]a agreed with this plan and agreed to follow up as directed. We will continue to monitor her blood pressure as well as her progress with the above lifestyle modifications. She agreed to continue Lisinopril 2.5 mg once daily #30 with no refills and will watch for signs of hypotension as she continues her lifestyle modifications.  Diabetes II with hyperglycemia Andrea Montes has been given extensive diabetes education by myself today including ideal fasting and post-prandial blood glucose readings, individual ideal Hgb A1c goals and hypoglycemia prevention. We discussed the importance of good blood sugar control to decrease the likelihood of diabetic complications such as nephropathy, neuropathy, limb loss, blindness, coronary artery disease, and death. We discussed the importance of intensive lifestyle modification including diet, exercise and weight loss as the first line treatment for diabetes. Andrea Montes agrees to continue her diabetes medications and will follow up at the agreed upon time.  Cardiovascular risk counseling Andrea Montes was given extended (15 minutes) coronary artery disease prevention counseling today. She is 63 y.o. female and has risk factors for heart disease including obesity, hypertension and diabetes. We discussed intensive lifestyle modifications today with an emphasis on specific weight loss instructions and strategies. Pt was also informed of the importance of increasing exercise and decreasing saturated fats to help prevent heart disease.  Obesity Andrea Montes is currently in the action stage of change. As such, her goal is to continue with weight loss efforts She  has agreed to keep a food journal with 350 to 400 calories and 35 grams of protein at lunch daily and follow the Category 3 plan Andrea Montes has been instructed to work up to a goal of 150 minutes of  combined cardio and strengthening exercise per week for weight loss and overall health benefits. She will consider taking water aerobics. We discussed the following Behavioral Modification Strategies today: increase H2O intake, keeping healthy foods in the home, increasing lean protein intake, decreasing simple carbohydrates , increasing vegetables and work on meal planning and easy cooking plans  Handouts for homemade seasonings and store bought seasonings were provided to patient today.  Andrea Montes has agreed to follow up with our clinic in 2 weeks. She was informed of the importance of frequent follow up visits to maximize her success with intensive lifestyle modifications for her multiple health conditions.  ALLERGIES: Allergies  Allergen Reactions  . Kiwi Extract Itching and Other (See Comments)    Itching in mouth with KIWI, And pineapple juice- topically irritating     MEDICATIONS: Current Outpatient Medications on File Prior to Visit  Medication Sig Dispense Refill  . Aspirin-Acetaminophen-Caffeine (GOODY HEADACHE PO) Take 1 Package by mouth as needed (pain).    [name] atorvastatin (LIPITOR) 10 MG tablet Take 1 tablet (10 mg total) by mouth at bedtime. 30 tablet 0  . bacitracin 500 UNIT/GM ointment Apply 1 application topically 2 (two) times daily. 15 g 0  . calcium carbonate (TUMS EX) 750 MG chewable tablet Chew 1-2 tablets by mouth 2 (two) times daily as needed for heartburn.    . furosemide (LASIX) 20 MG tablet Take 1 tablet (20 mg total) by mouth daily. (Patient taking differently: Take 20 mg by mouth daily as needed for fluid. ) 30 tablet 11  . meloxicam (MOBIC) 15 MG tablet Take 1 tablet (15 mg total) by mouth daily as needed for pain. (Patient taking differently: Take 15 mg by mouth daily. ) 60 tablet 1  . metFORMIN (GLUCOPHAGE) 500 MG tablet Take 1 tablet (500 mg total) by mouth daily with breakfast. 90 tablet 0  . montelukast (SINGULAIR) 10 MG tablet Take 1 tablet (10 mg total) by  mouth at bedtime. 30 tablet 0  . oxyCODONE-acetaminophen (PERCOCET/ROXICET) 5-325 MG tablet Take  1 tab  po q 6 hours prn pain 30 tablet 0  . phenazopyridine (PYRIDIUM) 100 MG tablet Take 1 tablet (100 mg total) by mouth 3 (three) times daily as needed for pain. 10 tablet 0  . SUMAtriptan (IMITREX) 25 MG tablet Take 25 mg by mouth every 2 (two) hours as needed for migraine or headache.   1  . traMADol (ULTRAM) 50 MG tablet Take 1 tablet (50 mg total) by mouth every 6 (six) hours as needed. (Patient taking differently: Take 50 mg by mouth every 6 (six) hours as needed (for pain.). ) 60 tablet 2  . Vitamin D, Ergocalciferol, (DRISDOL) 1.25 MG (50000 UT) CAPS capsule Take 1 capsule (50,000 Units total) by mouth every 7 (seven) days. 4 capsule 0  . albuterol (PROVENTIL HFA;VENTOLIN HFA) 108 (90 Base) MCG/ACT inhaler Inhale 2 puffs into the lungs every 6 (six) hours as needed for up to 10 days for wheezing or shortness of breath. 1 Inhaler 0   Current Facility-Administered Medications on File Prior to Visit  Medication Dose Route Frequency Provider Last Rate Last Dose  . 0.9 %  sodium chloride infusion  500 mL Intravenous Continuous Nandigam, Venia Minks, MD  PAST MEDICAL HISTORY: Past Medical History:  Diagnosis Date  . Anemia    no current med.  [name] Apnea   . 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 Back pain   . Carpal tunnel syndrome   . Complication of anesthesia    itching from last surgery that was completed at the surgery center off of [address] patient was given benedryl, generalized itching and they are not sure where that came from  . DDD (degenerative disc disease), lumbar   . Dental crowns present   . Edema of lower extremity    bilateral  . Frozen shoulder    surgery 11-04-14  . GERD (gastroesophageal reflux disease)    no current med.  [name] GERD (gastroesophageal reflux disease)   . Heart murmur    last  echo- 2010  . History of blood transfusion   .  History of shingles   . Hypertension    borderline not on medicaiton at this time  . Impingement syndrome of left shoulder region 10/2014  . Joint pain   . Migraines   . Obesity   . Pre-diabetes   . Rheumatoid arteritis ([place])   . Seasonal allergies   . Sleep apnea    uses CPAP nightly  . Trigger finger, left middle finger   . Vitamin D deficiency   . Wears partial dentures    upper    PAST SURGICAL HISTORY: Past Surgical History:  Procedure Laterality Date  . CARPAL TUNNEL RELEASE Left 04/25/2006  . CARPAL TUNNEL RELEASE Right 07/15/2018   Procedure: RIGHT CARPAL TUNNEL RELEASE;  Surgeon: Meredith Pel, MD;  Location: [place] [facility];  Service: Orthopedics;  Laterality: Right;  . CLOSED MANIPULATION SHOULDER WITH STERIOD INJECTION Left 02/03/2015   Procedure: LEFT  MANIPULATION SHOULDER UNDER ANESTHESIA WITH INJECTION OF STEROID;  Surgeon: Kathryne Hitch, MD;  Location: [place];  Service: Orthopedics;  Laterality: Left;  . COLONOSCOPY    . COLONOSCOPY WITH PROPOFOL  10/09/2011  . POLYPECTOMY    . RESECTION DISTAL CLAVICAL Left 11/04/2014   Procedure: RESECTION DISTAL CLAVICAL;  Surgeon: Kathryne Hitch, MD;  Location: [facility];  Service: Orthopedics;  Laterality: Left;  . ROTATOR CUFF REPAIR  2010  . SHOULDER ARTHROSCOPY WITH ROTATOR CUFF REPAIR Right 10/04/2008  . SHOULDER ARTHROSCOPY WITH SUBACROMIAL DECOMPRESSION, ROTATOR CUFF REPAIR AND BICEP TENDON REPAIR Left 11/04/2014   Procedure: LEFT SHOULDER ARTHROSCOPY WITH DEBRIDEMENT, DISTAL CLAVICLE EXCISION, ACROMIOPLASTY, ROTATOR CUFF REPAIR AND BICEP TENODESIS;  Surgeon: Kathryne Hitch, MD;  Location: [place];  Service: Orthopedics;  Laterality: Left;  . TRIGGER FINGER RELEASE Right 02/03/2015   Procedure: RIGHT LONG FINGER TRIGGER RELEASE;  Surgeon: Kathryne Hitch, MD;  Location: [place];  Service: Orthopedics;  Laterality: Right;  . TRIGGER FINGER RELEASE Left 10/10/2017    Procedure: LEFT HAND RELEASE TRIGGER FINGER 3RD LONG FINGER AND CYST EXCISION;  Surgeon: Meredith Pel, MD;  Location: [place];  Service: Orthopedics;  Laterality: Left;  . TUBAL LIGATION    . VAGINAL HYSTERECTOMY  2001   partial    SOCIAL HISTORY: Social History   Tobacco Use  . Smoking status: Never Smoker  . Smokeless tobacco: Never Used  Substance Use Topics  . Alcohol use: No  . Drug use: No    FAMILY HISTORY: Family History  Problem Relation Age of Onset  . Kidney disease Brother   . Hypertension Brother   . Leukemia Mother   . Heart  disease Mother   . Stroke Mother   . Cancer Mother   . Depression Mother   . Anxiety disorder Mother   . AAA (abdominal aortic aneurysm) Father   . Esophageal cancer Maternal Uncle   . Hypertension Brother   . Colon cancer Neg Hx   . Colon polyps Neg Hx   . Rectal cancer Neg Hx   . Stomach cancer Neg Hx     ROS: Review of Systems  Constitutional: Positive for weight loss.  Respiratory: Negative for shortness of breath (on exertion).   Cardiovascular: Negative for chest pain.    PHYSICAL EXAM: Blood pressure (!) 145/80, pulse 75, temperature 98.4 F (36.9 C), temperature source Oral, height 5\' 3"  (1.6 m), weight 260 lb (117.9 kg), SpO2 96 %. Body mass index is 46.06 kg/m. Physical Exam Vitals signs reviewed.  Constitutional:      Appearance: Normal appearance. She is well-developed. She is obese.  Cardiovascular:     Rate and Rhythm: Normal rate.  Pulmonary:     Effort: Pulmonary effort is normal.  Musculoskeletal: Normal range of motion.  Skin:    General: Skin is warm and dry.  Neurological:     Mental Status: She is alert and oriented to person, place, and time.  Psychiatric:        Mood and Affect: Mood normal.        Behavior: Behavior normal.     RECENT LABS AND TESTS: BMET    Component Value Date/Time   NA 140 07/09/2018 0925   NA 139 05/07/2018 1424   K 4.2 07/09/2018 0925   CL 108 07/09/2018  0925   CO2 25 07/09/2018 0925   GLUCOSE 124 (H) 07/09/2018 0925   BUN 10 07/09/2018 0925   BUN 10 05/07/2018 1424   CREATININE 0.85 07/09/2018 0925   CREATININE 0.85 03/07/2016 1654   CALCIUM 9.0 07/09/2018 0925   GFRNONAA >60 07/09/2018 0925   GFRAA >60 07/09/2018 0925   Lab Results  Component Value Date   HGBA1C 6.3 (H) 07/09/2018   HGBA1C 6.7 (H) 05/07/2018   Lab Results  Component Value Date   INSULIN 39.8 (H) 05/07/2018   CBC    Component Value Date/Time   WBC 7.5 07/09/2018 0925   RBC 6.24 (H) 07/09/2018 0925   HGB 13.1 07/09/2018 0925   HGB 11.8 05/07/2018 1424   HCT 44.8 07/09/2018 0925   HCT 36.9 05/07/2018 1424   PLT 270 07/09/2018 0925   MCV 71.8 (L) 07/09/2018 0925   MCV 69 (L) 05/07/2018 1424   MCH 21.0 (L) 07/09/2018 0925   MCHC 29.2 (L) 07/09/2018 0925   RDW 15.1 07/09/2018 0925   RDW 16.4 (H) 05/07/2018 1424   LYMPHSABS 2.9 05/07/2018 1424   MONOABS 0.5 07/14/2017 2115   EOSABS 0.1 05/07/2018 1424   BASOSABS 0.0 05/07/2018 1424   Iron/TIBC/Ferritin/ %Sat No results found for: IRON, TIBC, FERRITIN, IRONPCTSAT Lipid Panel     Component Value Date/Time   CHOL 130 05/07/2018 1424   TRIG 89 05/07/2018 1424   HDL 35 (L) 05/07/2018 1424   CHOLHDL 4.1 Ratio 09/26/2009 1823   VLDL 26 09/26/2009 1823   LDLCALC 77 05/07/2018 1424   Hepatic Function Panel     Component Value Date/Time   PROT 6.9 05/07/2018 1424   ALBUMIN 3.7 05/07/2018 1424   AST 17 05/07/2018 1424   ALT 6 05/07/2018 1424   ALKPHOS 93 05/07/2018 1424   BILITOT 0.2 05/07/2018 1424      Component  Value Date/Time   TSH 1.470 05/07/2018 1424   TSH 1.55 11/25/2008 1700   Results for KARMEL, PATRICELLI (MRN [id number]) as of 08/28/2018 14:23  Ref. Range 05/07/2018 14:24  Vitamin D, 25-Hydroxy Latest Ref Range: 30.0 - 100.0 ng/mL 14.4 (L)     OBESITY BEHAVIORAL INTERVENTION VISIT  Today's visit was # 7   Starting weight: 270 lbs Starting date: 05/07/2018 Today's weight : 260 lbs   Today's date: 08/28/2018 Total lbs lost to date: 10   ASK: We discussed the diagnosis of obesity with Andrea Montes today and Aldora agreed to give [name] permission to discuss obesity behavioral modification therapy today.  ASSESS: Aianna has the diagnosis of obesity and her BMI today is 46.07 Iqra is in the action stage of change   ADVISE: Talina was educated on the multiple health risks of obesity as well as the benefit of weight loss to improve her health. She was advised of the need for long term treatment and the importance of lifestyle modifications to improve her current health and to decrease her risk of future health problems.  AGREE: Multiple dietary modification options and treatment options were discussed and  Saleema agreed to follow the recommendations documented in the above note.  ARRANGE: Demecia was educated on the importance of frequent visits to treat obesity as outlined per CMS and USPSTF guidelines and agreed to schedule her next follow up appointment today.  Corey Skains, am acting as Location manager for [employer]. Owens Shark, DO  I have reviewed the above documentation for accuracy and completeness, and I agree with the above. -Jearld Lesch, DO

## 2018-09-17 ENCOUNTER — Encounter (INDEPENDENT_AMBULATORY_CARE_PROVIDER_SITE_OTHER): Payer: Self-pay | Admitting: Family Medicine

## 2018-09-17 ENCOUNTER — Ambulatory Visit (INDEPENDENT_AMBULATORY_CARE_PROVIDER_SITE_OTHER): Payer: 59 | Admitting: Family Medicine

## 2018-09-17 VITALS — BP 135/78 | HR 55 | Temp 97.7°F | Ht 63.0 in | Wt 264.0 lb

## 2018-09-17 DIAGNOSIS — Z6841 Body Mass Index (BMI) 40.0 and over, adult: Secondary | ICD-10-CM | POA: Diagnosis not present

## 2018-09-17 DIAGNOSIS — E559 Vitamin D deficiency, unspecified: Secondary | ICD-10-CM | POA: Diagnosis not present

## 2018-09-17 DIAGNOSIS — E1165 Type 2 diabetes mellitus with hyperglycemia: Secondary | ICD-10-CM | POA: Diagnosis not present

## 2018-09-17 DIAGNOSIS — Z9189 Other specified personal risk factors, not elsewhere classified: Secondary | ICD-10-CM | POA: Diagnosis not present

## 2018-09-17 MED ORDER — VITAMIN D (ERGOCALCIFEROL) 1.25 MG (50000 UNIT) PO CAPS: 50000.0000 [IU] | ORAL_CAPSULE | ORAL | 0 refills | Status: DC

## 2018-09-17 MED FILL — VIT D2 1.25 MG (50,000 UNIT: 1.25 MG | 28 days supply | Qty: 4 | Fill #0

## 2018-09-17 NOTE — Progress Notes (Signed)
Office: [phone]  /  Fax: [phone]   HPI:   Chief Complaint: OBESITY Andrea Montes is here to discuss her progress with her obesity treatment plan. She is on the keep a food journal with 350-400 calories and 35 grams of protein at lunch daily and follow the Category 3 plan and is following her eating plan approximately 80 % of the time. She states she is exercising 0 minutes 0 times per week. Andrea Montes has been drinking sweet tea daily for the past 2 weeks. She is getting in around 8 oz of meat at dinner. She is doing popcorn, [place] roll up with cheese, and rice crackers.  Her weight is 264 lb (119.7 kg) today and has gained 4 pounds since her last visit. She has lost 6 lbs since starting treatment with [name].  Diabetes II with Hyperglycemia Andrea Montes has a diagnosis of diabetes type II. Vali notes carbohydrate cravings and denies GI side effects of metformin. Last A1c was 6.3. She denies hypoglycemia. She has been working on intensive lifestyle modifications including diet, exercise, and weight loss to help control her blood glucose levels.  Vitamin D Deficiency Andrea Montes has a diagnosis of vitamin D deficiency. She is currently taking prescription Vit D. She notes fatigue and denies nausea, vomiting or muscle weakness.  At risk for osteopenia and osteoporosis Andrea Montes is at higher risk of osteopenia and osteoporosis due to vitamin D deficiency.   ASSESSMENT AND PLAN:  Vitamin D deficiency - Plan: Vitamin D, Ergocalciferol, (DRISDOL) 1.25 MG (50000 UT) CAPS capsule  Type 2 diabetes mellitus with hyperglycemia, without long-term current use of insulin (HCC)  At risk for osteoporosis  Class 3 severe obesity with serious comorbidity and body mass index (BMI) of 45.0 to 49.9 in adult, unspecified obesity type ([place])  PLAN:  Diabetes II with Hyperglycemia Andrea Montes has been given extensive diabetes education by myself today including ideal fasting and post-prandial blood glucose readings,  individual ideal Hgb A1c goals and hypoglycemia prevention. We discussed the importance of good blood sugar control to decrease the likelihood of diabetic complications such as nephropathy, neuropathy, limb loss, blindness, coronary artery disease, and death. We discussed the importance of intensive lifestyle modification including diet, exercise and weight loss as the first line treatment for diabetes. Andrea Montes agrees to continue taking metformin, no refill needed and she agrees to follow up with our clinic in 2 weeks.  Vitamin D Deficiency Andrea Montes was informed that low vitamin D levels contributes to fatigue and are associated with obesity, breast, and colon cancer. Andrea Montes agrees to continue taking prescription Vit D @50 ,000 IU every week #4 and we will refill for 1 month. She will follow up for routine testing of vitamin D, at least 2-3 times per year. She was informed of the risk of over-replacement of vitamin D and agrees to not increase her dose unless she discusses this with [name] first. Andrea Montes agrees to follow up with our clinic in 2 weeks.  At risk for osteopenia and osteoporosis Andrea Montes was given extended (15 minutes) osteoporosis prevention counseling today. Andrea Montes is at risk for osteopenia and osteoporsis due to her vitamin D deficiency. She was encouraged to take her vitamin D and follow her higher calcium diet and increase strengthening exercise to help strengthen her bones and decrease her risk of osteopenia and osteoporosis.  Obesity Andrea Montes is currently in the action stage of change. As such, her goal is to continue with weight loss efforts She has agreed to follow the Category 3 plan Andrea Montes has been  instructed to work up to a goal of 150 minutes of combined cardio and strengthening exercise per week or water aerobics 1 time this week and 2 times next week for weight loss and overall health benefits. We discussed the following Behavioral Modification Strategies today: increasing lean protein  intake, increasing vegetables, work on meal planning and easy cooking plans, better snacking choices, and planning for success   Andrea Montes has agreed to follow up with our clinic in 2 weeks. She was informed of the importance of frequent follow up visits to maximize her success with intensive lifestyle modifications for her multiple health conditions.  ALLERGIES: Allergies  Allergen Reactions  . Kiwi Extract Itching and Other (See Comments)    Itching in mouth with KIWI, And pineapple juice- topically irritating     MEDICATIONS: Current Outpatient Medications on File Prior to Visit  Medication Sig Dispense Refill  . Aspirin-Acetaminophen-Caffeine (GOODY HEADACHE PO) Take 1 Package by mouth as needed (pain).    [name] atorvastatin (LIPITOR) 10 MG tablet Take 1 tablet (10 mg total) by mouth at bedtime. 30 tablet 0  . bacitracin 500 UNIT/GM ointment Apply 1 application topically 2 (two) times daily. 15 g 0  . calcium carbonate (TUMS EX) 750 MG chewable tablet Chew 1-2 tablets by mouth 2 (two) times daily as needed for heartburn.    . furosemide (LASIX) 20 MG tablet Take 1 tablet (20 mg total) by mouth daily. (Patient taking differently: Take 20 mg by mouth daily as needed for fluid. ) 30 tablet 11  . lisinopril (ZESTRIL) 2.5 MG tablet Take 1 tablet (2.5 mg total) by mouth daily. 30 tablet 0  . meloxicam (MOBIC) 15 MG tablet Take 1 tablet (15 mg total) by mouth daily as needed for pain. (Patient taking differently: Take 15 mg by mouth daily. ) 60 tablet 1  . metFORMIN (GLUCOPHAGE) 500 MG tablet Take 1 tablet (500 mg total) by mouth daily with breakfast. 90 tablet 0  . montelukast (SINGULAIR) 10 MG tablet Take 1 tablet (10 mg total) by mouth at bedtime. 30 tablet 0  . oxyCODONE-acetaminophen (PERCOCET/ROXICET) 5-325 MG tablet Take  1 tab  po q 6 hours prn pain 30 tablet 0  . phenazopyridine (PYRIDIUM) 100 MG tablet Take 1 tablet (100 mg total) by mouth 3 (three) times daily as needed for pain. 10 tablet  0  . SUMAtriptan (IMITREX) 25 MG tablet Take 25 mg by mouth every 2 (two) hours as needed for migraine or headache.   1  . traMADol (ULTRAM) 50 MG tablet Take 1 tablet (50 mg total) by mouth every 6 (six) hours as needed. (Patient taking differently: Take 50 mg by mouth every 6 (six) hours as needed (for pain.). ) 60 tablet 2  . albuterol (PROVENTIL HFA;VENTOLIN HFA) 108 (90 Base) MCG/ACT inhaler Inhale 2 puffs into the lungs every 6 (six) hours as needed for up to 10 days for wheezing or shortness of breath. 1 Inhaler 0   Current Facility-Administered Medications on File Prior to Visit  Medication Dose Route Frequency Provider Last Rate Last Dose  . 0.9 %  sodium chloride infusion  500 mL Intravenous Continuous Nandigam, Venia Minks, MD        PAST MEDICAL HISTORY: Past Medical History:  Diagnosis Date  . Anemia    no current med.  [name] Apnea   . 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 Back pain   . Carpal tunnel syndrome   . Complication of anesthesia  itching from last surgery that was completed at the surgery center off of [address] patient was given benedryl, generalized itching and they are not sure where that came from  . DDD (degenerative disc disease), lumbar   . Dental crowns present   . Edema of lower extremity    bilateral  . Frozen shoulder    surgery 11-04-14  . GERD (gastroesophageal reflux disease)    no current med.  [name] GERD (gastroesophageal reflux disease)   . Heart murmur    last  echo- 2010  . History of blood transfusion   . History of shingles   . Hypertension    borderline not on medicaiton at this time  . Impingement syndrome of left shoulder region 10/2014  . Joint pain   . Migraines   . Obesity   . Pre-diabetes   . Rheumatoid arteritis ([place])   . Seasonal allergies   . Sleep apnea    uses CPAP nightly  . Trigger finger, left middle finger   . Vitamin D deficiency   . Wears partial dentures    upper    PAST SURGICAL  HISTORY: Past Surgical History:  Procedure Laterality Date  . CARPAL TUNNEL RELEASE Left 04/25/2006  . CARPAL TUNNEL RELEASE Right 07/15/2018   Procedure: RIGHT CARPAL TUNNEL RELEASE;  Surgeon: Meredith Pel, MD;  Location: [facility];  Service: Orthopedics;  Laterality: Right;  . CLOSED MANIPULATION SHOULDER WITH STERIOD INJECTION Left 02/03/2015   Procedure: LEFT  MANIPULATION SHOULDER UNDER ANESTHESIA WITH INJECTION OF STEROID;  Surgeon: Kathryne Hitch, MD;  Location: [facility];  Service: Orthopedics;  Laterality: Left;  . COLONOSCOPY    . COLONOSCOPY WITH PROPOFOL  10/09/2011  . POLYPECTOMY    . RESECTION DISTAL CLAVICAL Left 11/04/2014   Procedure: RESECTION DISTAL CLAVICAL;  Surgeon: Kathryne Hitch, MD;  Location: [facility];  Service: Orthopedics;  Laterality: Left;  . ROTATOR CUFF REPAIR  2010  . SHOULDER ARTHROSCOPY WITH ROTATOR CUFF REPAIR Right 10/04/2008  . SHOULDER ARTHROSCOPY WITH SUBACROMIAL DECOMPRESSION, ROTATOR CUFF REPAIR AND BICEP TENDON REPAIR Left 11/04/2014   Procedure: LEFT SHOULDER ARTHROSCOPY WITH DEBRIDEMENT, DISTAL CLAVICLE EXCISION, ACROMIOPLASTY, ROTATOR CUFF REPAIR AND BICEP TENODESIS;  Surgeon: Kathryne Hitch, MD;  Location: [facility];  Service: Orthopedics;  Laterality: Left;  . TRIGGER FINGER RELEASE Right 02/03/2015   Procedure: RIGHT LONG FINGER TRIGGER RELEASE;  Surgeon: Kathryne Hitch, MD;  Location: [facility];  Service: Orthopedics;  Laterality: Right;  . TRIGGER FINGER RELEASE Left 10/10/2017   Procedure: LEFT HAND RELEASE TRIGGER FINGER 3RD LONG FINGER AND CYST EXCISION;  Surgeon: Meredith Pel, MD;  Location: [facility];  Service: Orthopedics;  Laterality: Left;  . TUBAL LIGATION    . VAGINAL HYSTERECTOMY  2001   partial    SOCIAL HISTORY: Social History   Tobacco Use  . Smoking status: Never Smoker  . Smokeless tobacco: Never Used  Substance Use Topics  . Alcohol use: No  . Drug use: No     FAMILY HISTORY: Family History  Problem Relation Age of Onset  . Kidney disease Brother   . Hypertension Brother   . Leukemia Mother   . Heart disease Mother   . Stroke Mother   . Cancer Mother   . Depression Mother   . Anxiety disorder Mother   . AAA (abdominal aortic aneurysm) Father   . Esophageal cancer Maternal Uncle   . Hypertension Brother   . Colon cancer Neg Hx   .  Colon polyps Neg Hx   . Rectal cancer Neg Hx   . Stomach cancer Neg Hx     ROS: Review of Systems  Constitutional: Positive for malaise/fatigue.  Gastrointestinal: Negative for nausea and vomiting.  Musculoskeletal:       Negative muscle weakness  Endo/Heme/Allergies:       Negative hypoglycemia    PHYSICAL EXAM: Blood pressure 135/78, pulse (!) 55, temperature 97.7 F (36.5 C), temperature source Oral, height 5\' 3"  (1.6 m), weight 264 lb (119.7 kg), SpO2 98 %. Body mass index is 46.77 kg/m. Physical Exam Vitals signs reviewed.  Constitutional:      Appearance: Normal appearance. She is obese.  Cardiovascular:     Rate and Rhythm: Normal rate.     Pulses: Normal pulses.  Pulmonary:     Effort: Pulmonary effort is normal.     Breath sounds: Normal breath sounds.  Musculoskeletal: Normal range of motion.  Skin:    General: Skin is warm and dry.  Neurological:     Mental Status: She is alert and oriented to person, place, and time.  Psychiatric:        Mood and Affect: Mood normal.        Behavior: Behavior normal.     RECENT LABS AND TESTS: BMET    Component Value Date/Time   NA 140 07/09/2018 0925   NA 139 05/07/2018 1424   K 4.2 07/09/2018 0925   CL 108 07/09/2018 0925   CO2 25 07/09/2018 0925   GLUCOSE 124 (H) 07/09/2018 0925   BUN 10 07/09/2018 0925   BUN 10 05/07/2018 1424   CREATININE 0.85 07/09/2018 0925   CREATININE 0.85 03/07/2016 1654   CALCIUM 9.0 07/09/2018 0925   GFRNONAA >60 07/09/2018 0925   GFRAA >60 07/09/2018 0925   Lab Results  Component Value Date    HGBA1C 6.3 (H) 07/09/2018   HGBA1C 6.7 (H) 05/07/2018   Lab Results  Component Value Date   INSULIN 39.8 (H) 05/07/2018   CBC    Component Value Date/Time   WBC 7.5 07/09/2018 0925   RBC 6.24 (H) 07/09/2018 0925   HGB 13.1 07/09/2018 0925   HGB 11.8 05/07/2018 1424   HCT 44.8 07/09/2018 0925   HCT 36.9 05/07/2018 1424   PLT 270 07/09/2018 0925   MCV 71.8 (L) 07/09/2018 0925   MCV 69 (L) 05/07/2018 1424   MCH 21.0 (L) 07/09/2018 0925   MCHC 29.2 (L) 07/09/2018 0925   RDW 15.1 07/09/2018 0925   RDW 16.4 (H) 05/07/2018 1424   LYMPHSABS 2.9 05/07/2018 1424   MONOABS 0.5 07/14/2017 2115   EOSABS 0.1 05/07/2018 1424   BASOSABS 0.0 05/07/2018 1424   Iron/TIBC/Ferritin/ %Sat No results found for: IRON, TIBC, FERRITIN, IRONPCTSAT Lipid Panel     Component Value Date/Time   CHOL 130 05/07/2018 1424   TRIG 89 05/07/2018 1424   HDL 35 (L) 05/07/2018 1424   CHOLHDL 4.1 Ratio 09/26/2009 1823   VLDL 26 09/26/2009 1823   LDLCALC 77 05/07/2018 1424   Hepatic Function Panel     Component Value Date/Time   PROT 6.9 05/07/2018 1424   ALBUMIN 3.7 05/07/2018 1424   AST 17 05/07/2018 1424   ALT 6 05/07/2018 1424   ALKPHOS 93 05/07/2018 1424   BILITOT 0.2 05/07/2018 1424      Component Value Date/Time   TSH 1.470 05/07/2018 1424   TSH 1.55 11/25/2008 1700      OBESITY BEHAVIORAL INTERVENTION VISIT  Today's visit was # 8  Starting weight: 270 lbs Starting date: 05/07/18 Today's weight : 264 lbs  Today's date: 09/17/2018 Total lbs lost to date: 6    ASK: We discussed the diagnosis of obesity with Andrea Montes today and [place] agreed to give [name] permission to discuss obesity behavioral modification therapy today.  ASSESS: Andrea Montes has the diagnosis of obesity and her BMI today is 46.78 Andrea Montes is in the action stage of change   ADVISE: Andrea Montes was educated on the multiple health risks of obesity as well as the benefit of weight loss to improve her health. She was  advised of the need for long term treatment and the importance of lifestyle modifications to improve her current health and to decrease her risk of future health problems.  AGREE: Multiple dietary modification options and treatment options were discussed and  Andrea Montes agreed to follow the recommendations documented in the above note.  ARRANGE: Andrea Montes was educated on the importance of frequent visits to treat obesity as outlined per CMS and USPSTF guidelines and agreed to schedule her next follow up appointment today.  I, Amry Cathy, am acting as transcriptionist for Ilene Qua, MD  I have reviewed the above documentation for accuracy and completeness, and I agree with the above. - Ilene Qua, MD

## 2018-10-01 ENCOUNTER — Ambulatory Visit (INDEPENDENT_AMBULATORY_CARE_PROVIDER_SITE_OTHER): Payer: 59 | Admitting: Family Medicine

## 2018-10-01 ENCOUNTER — Encounter (INDEPENDENT_AMBULATORY_CARE_PROVIDER_SITE_OTHER): Payer: Self-pay | Admitting: Family Medicine

## 2018-10-01 VITALS — BP 123/76 | HR 56 | Temp 97.9°F | Ht 63.0 in | Wt 262.0 lb

## 2018-10-01 DIAGNOSIS — Z6841 Body Mass Index (BMI) 40.0 and over, adult: Secondary | ICD-10-CM

## 2018-10-01 DIAGNOSIS — E119 Type 2 diabetes mellitus without complications: Secondary | ICD-10-CM | POA: Insufficient documentation

## 2018-10-01 DIAGNOSIS — R7303 Prediabetes: Secondary | ICD-10-CM | POA: Insufficient documentation

## 2018-10-01 NOTE — Progress Notes (Signed)
Office: [phone]  /  Fax: [phone]   HPI:   Chief Complaint: OBESITY Andrea Montes is here to discuss her progress with her obesity treatment plan. She is on the Category 3 plan and is following her eating plan approximately 80 % of the time. She states she is exercising 0 minutes 0 times per week. Andrea Montes eating all food and protein.  Her weight is 262 lb (118.8 kg) today and has had a weight loss of 2 pounds over a period of 2 weeks since her last visit. She has lost 8 lbs since starting treatment with [name].  Diabetes II Andrea Montes has a diagnosis of diabetes type II. Andrea Montes states patient does not check sugars and denies any hypoglycemic episodes. She denies diarrhea. Last A1c was Hemoglobin A1C Latest Ref Rng & Units 07/09/2018 05/07/2018  HGBA1C 4.8 - 5.6 % 6.3(H) 6.7(H)  Some recent data might be hidden    She has been working on intensive lifestyle modifications including diet, exercise, and weight loss to help control her blood glucose levels.  ASSESSMENT AND PLAN:  Type 2 diabetes mellitus without complication, without long-term current use of insulin (HCC)  Class 3 severe obesity with serious comorbidity and body mass index (BMI) of 45.0 to 49.9 in adult, unspecified obesity type (Andrea Montes)  PLAN:  Diabetes II Andrea Montes has been given extensive diabetes education by myself today including ideal fasting and post-prandial blood glucose readings, individual ideal Hgb A1c goals  and hypoglycemia prevention. We discussed the importance of good blood sugar control to decrease the likelihood of diabetic complications such as nephropathy, neuropathy, limb loss, blindness, coronary artery disease, and death. We discussed the importance of intensive lifestyle modification including diet, exercise and weight loss as the first line treatment for diabetes. Andrea Montes agrees to continue taking metformin and follow meal plan. Andrea Montes agrees to follow up with our clinic in 2 weeks.  I spent > than 50% of the  15 minute visit on counseling as documented in the note.  Obesity Andrea Montes is currently in the action stage of change. As such, her goal is to continue with weight loss efforts She has agreed to follow the Category 3 plan Andrea Montes has not been prescribed exercise. We discussed the following Behavioral Modification Strategies today: increasing lean protein intake and decrease liquid calories. Discussed substitutions for eggs.  Andrea Montes has agreed to follow up with our clinic in 2 weeks. She was informed of the importance of frequent follow up visits to maximize her success with intensive lifestyle modifications for her multiple health conditions.  ALLERGIES: Allergies  Allergen Reactions  . Kiwi Extract Itching and Other (See Comments)    Itching in mouth with KIWI, And pineapple juice- topically irritating     MEDICATIONS: Current Outpatient Medications on File Prior to Visit  Medication Sig Dispense Refill  . Aspirin-Acetaminophen-Caffeine (GOODY HEADACHE PO) Take 1 Package by mouth as needed (pain).    [name] atorvastatin (LIPITOR) 10 MG tablet Take 1 tablet (10 mg total) by mouth at bedtime. 30 tablet 0  . bacitracin 500 UNIT/GM ointment Apply 1 application topically 2 (two) times daily. 15 g 0  . calcium carbonate (TUMS EX) 750 MG chewable tablet Chew 1-2 tablets by mouth 2 (two) times daily as needed for heartburn.    . furosemide (LASIX) 20 MG tablet Take 1 tablet (20 mg total) by mouth daily. (Patient taking differently: Take 20 mg by mouth daily as needed for fluid. ) 30 tablet 11  . lisinopril (ZESTRIL) 2.5 MG tablet Take  1 tablet (2.5 mg total) by mouth daily. 30 tablet 0  . meloxicam (MOBIC) 15 MG tablet Take 1 tablet (15 mg total) by mouth daily as needed for pain. (Patient taking differently: Take 15 mg by mouth daily. ) 60 tablet 1  . metFORMIN (GLUCOPHAGE) 500 MG tablet Take 1 tablet (500 mg total) by mouth daily with breakfast. 90 tablet 0  . montelukast (SINGULAIR) 10 MG tablet  Take 1 tablet (10 mg total) by mouth at bedtime. 30 tablet 0  . oxyCODONE-acetaminophen (PERCOCET/ROXICET) 5-325 MG tablet Take  1 tab  po q 6 hours prn pain 30 tablet 0  . phenazopyridine (PYRIDIUM) 100 MG tablet Take 1 tablet (100 mg total) by mouth 3 (three) times daily as needed for pain. 10 tablet 0  . SUMAtriptan (IMITREX) 25 MG tablet Take 25 mg by mouth every 2 (two) hours as needed for migraine or headache.   1  . traMADol (ULTRAM) 50 MG tablet Take 1 tablet (50 mg total) by mouth every 6 (six) hours as needed. (Patient taking differently: Take 50 mg by mouth every 6 (six) hours as needed (for pain.). ) 60 tablet 2  . Vitamin D, Ergocalciferol, (DRISDOL) 1.25 MG (50000 UT) CAPS capsule Take 1 capsule (50,000 Units total) by mouth every 7 (seven) days. 4 capsule 0  . albuterol (PROVENTIL HFA;VENTOLIN HFA) 108 (90 Base) MCG/ACT inhaler Inhale 2 puffs into the lungs every 6 (six) hours as needed for up to 10 days for wheezing or shortness of breath. 1 Inhaler 0   Current Facility-Administered Medications on File Prior to Visit  Medication Dose Route Frequency Provider Last Rate Last Dose  . 0.9 %  sodium chloride infusion  500 mL Intravenous Continuous Nandigam, Venia Minks, MD        PAST MEDICAL HISTORY: Past Medical History:  Diagnosis Date  . Anemia    no current med.  [name] Apnea   . 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 Back pain   . Carpal tunnel syndrome   . Complication of anesthesia    itching from last surgery that was completed at the surgery center off of [address] patient was given benedryl, generalized itching and they are not sure where that came from  . DDD (degenerative disc disease), lumbar   . Dental crowns present   . Edema of lower extremity    bilateral  . Frozen shoulder    surgery 11-04-14  . GERD (gastroesophageal reflux disease)    no current med.  [name] GERD (gastroesophageal reflux disease)   . Heart murmur    last  echo- 2010  .  History of blood transfusion   . History of shingles   . Hypertension    borderline not on medicaiton at this time  . Impingement syndrome of left shoulder region 10/2014  . Joint pain   . Migraines   . Obesity   . Pre-diabetes   . Rheumatoid arteritis ([place])   . Seasonal allergies   . Sleep apnea    uses CPAP nightly  . Trigger finger, left middle finger   . Vitamin D deficiency   . Wears partial dentures    upper    PAST SURGICAL HISTORY: Past Surgical History:  Procedure Laterality Date  . CARPAL TUNNEL RELEASE Left 04/25/2006  . CARPAL TUNNEL RELEASE Right 07/15/2018   Procedure: RIGHT CARPAL TUNNEL RELEASE;  Surgeon: Meredith Pel, MD;  Location: [facility];  Service: Orthopedics;  Laterality: Right;  .  CLOSED MANIPULATION SHOULDER WITH STERIOD INJECTION Left 02/03/2015   Procedure: LEFT  MANIPULATION SHOULDER UNDER ANESTHESIA WITH INJECTION OF STEROID;  Surgeon: Kathryne Hitch, MD;  Location: [facility];  Service: Orthopedics;  Laterality: Left;  . COLONOSCOPY    . COLONOSCOPY WITH PROPOFOL  10/09/2011  . POLYPECTOMY    . RESECTION DISTAL CLAVICAL Left 11/04/2014   Procedure: RESECTION DISTAL CLAVICAL;  Surgeon: Kathryne Hitch, MD;  Location: [facility];  Service: Orthopedics;  Laterality: Left;  . ROTATOR CUFF REPAIR  2010  . SHOULDER ARTHROSCOPY WITH ROTATOR CUFF REPAIR Right 10/04/2008  . SHOULDER ARTHROSCOPY WITH SUBACROMIAL DECOMPRESSION, ROTATOR CUFF REPAIR AND BICEP TENDON REPAIR Left 11/04/2014   Procedure: LEFT SHOULDER ARTHROSCOPY WITH DEBRIDEMENT, DISTAL CLAVICLE EXCISION, ACROMIOPLASTY, ROTATOR CUFF REPAIR AND BICEP TENODESIS;  Surgeon: Kathryne Hitch, MD;  Location: [facility];  Service: Orthopedics;  Laterality: Left;  . TRIGGER FINGER RELEASE Right 02/03/2015   Procedure: RIGHT LONG FINGER TRIGGER RELEASE;  Surgeon: Kathryne Hitch, MD;  Location: [facility];  Service: Orthopedics;  Laterality: Right;  . TRIGGER  FINGER RELEASE Left 10/10/2017   Procedure: LEFT HAND RELEASE TRIGGER FINGER 3RD LONG FINGER AND CYST EXCISION;  Surgeon: Meredith Pel, MD;  Location: [facility];  Service: Orthopedics;  Laterality: Left;  . TUBAL LIGATION    . VAGINAL HYSTERECTOMY  2001   partial    SOCIAL HISTORY: Social History   Tobacco Use  . Smoking status: Never Smoker  . Smokeless tobacco: Never Used  Substance Use Topics  . Alcohol use: No  . Drug use: No    FAMILY HISTORY: Family History  Problem Relation Age of Onset  . Kidney disease Brother   . Hypertension Brother   . Leukemia Mother   . Heart disease Mother   . Stroke Mother   . Cancer Mother   . Depression Mother   . Anxiety disorder Mother   . AAA (abdominal aortic aneurysm) Father   . Esophageal cancer Maternal Uncle   . Hypertension Brother   . Colon cancer Neg Hx   . Colon polyps Neg Hx   . Rectal cancer Neg Hx   . Stomach cancer Neg Hx     ROS: Review of Systems  Constitutional: Positive for weight loss.  Gastrointestinal: Negative for diarrhea, nausea and vomiting.  Endo/Heme/Allergies:       Negative for hypoglycemia     PHYSICAL EXAM: Blood pressure 123/76, pulse (!) 56, temperature 97.9 F (36.6 C), temperature source Oral, height 5\' 3"  (1.6 m), weight 262 lb (118.8 kg), SpO2 98 %. Body mass index is 46.41 kg/m. Physical Exam Vitals signs reviewed.  Constitutional:      Appearance: Normal appearance. She is obese.  Cardiovascular:     Rate and Rhythm: Normal rate.     Pulses: Normal pulses.  Pulmonary:     Effort: Pulmonary effort is normal.  Musculoskeletal: Normal range of motion.  Skin:    General: Skin is warm and dry.  Neurological:     Mental Status: She is alert and oriented to person, place, and time.  Psychiatric:        Mood and Affect: Mood normal.        Behavior: Behavior normal.     RECENT LABS AND TESTS: BMET    Component Value Date/Time   NA 140 07/09/2018 0925   NA 139 05/07/2018  1424   K 4.2 07/09/2018 0925   CL 108 07/09/2018 0925   CO2 25  07/09/2018 0925   GLUCOSE 124 (H) 07/09/2018 0925   BUN 10 07/09/2018 0925   BUN 10 05/07/2018 1424   CREATININE 0.85 07/09/2018 0925   CREATININE 0.85 03/07/2016 1654   CALCIUM 9.0 07/09/2018 0925   GFRNONAA >60 07/09/2018 0925   GFRAA >60 07/09/2018 0925   Lab Results  Component Value Date   HGBA1C 6.3 (H) 07/09/2018   HGBA1C 6.7 (H) 05/07/2018   Lab Results  Component Value Date   INSULIN 39.8 (H) 05/07/2018   CBC    Component Value Date/Time   WBC 7.5 07/09/2018 0925   RBC 6.24 (H) 07/09/2018 0925   HGB 13.1 07/09/2018 0925   HGB 11.8 05/07/2018 1424   HCT 44.8 07/09/2018 0925   HCT 36.9 05/07/2018 1424   PLT 270 07/09/2018 0925   MCV 71.8 (L) 07/09/2018 0925   MCV 69 (L) 05/07/2018 1424   MCH 21.0 (L) 07/09/2018 0925   MCHC 29.2 (L) 07/09/2018 0925   RDW 15.1 07/09/2018 0925   RDW 16.4 (H) 05/07/2018 1424   LYMPHSABS 2.9 05/07/2018 1424   MONOABS 0.5 07/14/2017 2115   EOSABS 0.1 05/07/2018 1424   BASOSABS 0.0 05/07/2018 1424   Iron/TIBC/Ferritin/ %Sat No results found for: IRON, TIBC, FERRITIN, IRONPCTSAT Lipid Panel     Component Value Date/Time   CHOL 130 05/07/2018 1424   TRIG 89 05/07/2018 1424   HDL 35 (L) 05/07/2018 1424   CHOLHDL 4.1 Ratio 09/26/2009 1823   VLDL 26 09/26/2009 1823   LDLCALC 77 05/07/2018 1424   Hepatic Function Panel     Component Value Date/Time   PROT 6.9 05/07/2018 1424   ALBUMIN 3.7 05/07/2018 1424   AST 17 05/07/2018 1424   ALT 6 05/07/2018 1424   ALKPHOS 93 05/07/2018 1424   BILITOT 0.2 05/07/2018 1424      Component Value Date/Time   TSH 1.470 05/07/2018 1424   TSH 1.55 11/25/2008 1700      OBESITY BEHAVIORAL INTERVENTION VISIT  Today's visit was # 9   Starting weight: 270 lbs Starting date: 05/07/2018 Today's weight :262 lbs Today's date: 10/01/2018 Total lbs lost to date: 8  ASK: We discussed the diagnosis of obesity with Andrea Montes today and Andrea Montes agreed to give [name] permission to discuss obesity behavioral modification therapy today.  ASSESS: Andrea Montes has the diagnosis of obesity and her BMI today is 46.42 Andrea Montes is in the action stage of change   ADVISE: Andrea Montes was educated on the multiple health risks of obesity as well as the benefit of weight loss to improve her health. She was advised of the need for long term treatment and the importance of lifestyle modifications to improve her current health and to decrease her risk of future health problems.  AGREE: Multiple dietary modification options and treatment options were discussed and  Andrea Montes agreed to follow the recommendations documented in the above note.  ARRANGE: Andrea Montes was educated on the importance of frequent visits to treat obesity as outlined per CMS and USPSTF guidelines and agreed to schedule her next follow up appointment today.  I, Tammy Wysor, am acting as Location manager for [employer], FNP-C.  I have reviewed the above documentation for accuracy and completeness, and I agree with the above.  -  , FNP-C.

## 2018-10-16 ENCOUNTER — Encounter: Payer: Self-pay | Admitting: Podiatry

## 2018-10-16 ENCOUNTER — Ambulatory Visit (INDEPENDENT_AMBULATORY_CARE_PROVIDER_SITE_OTHER): Payer: 59 | Admitting: Podiatry

## 2018-10-16 ENCOUNTER — Ambulatory Visit (INDEPENDENT_AMBULATORY_CARE_PROVIDER_SITE_OTHER): Payer: 59 | Admitting: Family Medicine

## 2018-10-16 ENCOUNTER — Encounter (INDEPENDENT_AMBULATORY_CARE_PROVIDER_SITE_OTHER): Payer: Self-pay | Admitting: Family Medicine

## 2018-10-16 VITALS — BP 123/74 | HR 66 | Temp 97.7°F | Ht 63.0 in | Wt 264.0 lb

## 2018-10-16 DIAGNOSIS — Z9189 Other specified personal risk factors, not elsewhere classified: Secondary | ICD-10-CM

## 2018-10-16 DIAGNOSIS — E1165 Type 2 diabetes mellitus with hyperglycemia: Secondary | ICD-10-CM

## 2018-10-16 DIAGNOSIS — B353 Tinea pedis: Secondary | ICD-10-CM | POA: Diagnosis not present

## 2018-10-16 DIAGNOSIS — E559 Vitamin D deficiency, unspecified: Secondary | ICD-10-CM

## 2018-10-16 DIAGNOSIS — Z6841 Body Mass Index (BMI) 40.0 and over, adult: Secondary | ICD-10-CM

## 2018-10-16 DIAGNOSIS — D508 Other iron deficiency anemias: Secondary | ICD-10-CM | POA: Diagnosis not present

## 2018-10-16 DIAGNOSIS — M722 Plantar fascial fibromatosis: Secondary | ICD-10-CM

## 2018-10-16 DIAGNOSIS — G4730 Sleep apnea, unspecified: Secondary | ICD-10-CM | POA: Diagnosis not present

## 2018-10-16 MED ORDER — VITAMIN D (ERGOCALCIFEROL) 1.25 MG (50000 UNIT) PO CAPS: 50000.0000 [IU] | ORAL_CAPSULE | ORAL | 0 refills | Status: DC

## 2018-10-16 NOTE — Progress Notes (Signed)
Subjective:  Patient ID: Andrea Montes, female    DOB: [birth year],  MRN: [id number]  Chief Complaint  Patient presents with  . Plantar Fasciitis    Follow up bilateral heels   "My heels have been hurting again and I'm still wearing the orthotics"    63 y.o. female presents with the above complaint. Also complains of athlete's foot right foot, hasn't completely gone away.  Review of Systems: Negative except as noted in the HPI. Denies N/V/F/Ch.  Past Medical History:  Diagnosis Date  . Anemia    no current med.  [name] Apnea   . 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 Back pain   . Carpal tunnel syndrome   . Complication of anesthesia    itching from last surgery that was completed at the surgery center off of [address] patient was given benedryl, generalized itching and they are not sure where that came from  . DDD (degenerative disc disease), lumbar   . Dental crowns present   . Edema of lower extremity    bilateral  . Frozen shoulder    surgery 11-04-14  . GERD (gastroesophageal reflux disease)    no current med.  [name] GERD (gastroesophageal reflux disease)   . Heart murmur    last  echo- 2010  . History of blood transfusion   . History of shingles   . Hypertension    borderline not on medicaiton at this time  . Impingement syndrome of left shoulder region 10/2014  . Joint pain   . Migraines   . Obesity   . Pre-diabetes   . 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 Seasonal allergies   . Sleep apnea    uses CPAP nightly  . Trigger finger, left middle finger   . Vitamin D deficiency   . Wears partial dentures    upper    Current Outpatient Medications:  .  albuterol (PROVENTIL HFA;VENTOLIN HFA) 108 (90 Base) MCG/ACT inhaler, Inhale 2 puffs into the lungs every 6 (six) hours as needed for up to 10 days for wheezing or shortness of breath., Disp: 1 Inhaler, Rfl: 0 .  Aspirin-Acetaminophen-Caffeine (GOODY HEADACHE PO), Take 1 Package by mouth as needed  (pain)., Disp: , Rfl:  .  atorvastatin (LIPITOR) 10 MG tablet, Take 1 tablet (10 mg total) by mouth at bedtime., Disp: 30 tablet, Rfl: 0 .  bacitracin 500 UNIT/GM ointment, Apply 1 application topically 2 (two) times daily., Disp: 15 g, Rfl: 0 .  calcium carbonate (TUMS EX) 750 MG chewable tablet, Chew 1-2 tablets by mouth 2 (two) times daily as needed for heartburn., Disp: , Rfl:  .  furosemide (LASIX) 20 MG tablet, Take 1 tablet (20 mg total) by mouth daily. (Patient taking differently: Take 20 mg by mouth daily as needed for fluid. ), Disp: 30 tablet, Rfl: 11 .  meloxicam (MOBIC) 15 MG tablet, Take 1 tablet (15 mg total) by mouth daily as needed for pain. (Patient taking differently: Take 15 mg by mouth daily. ), Disp: 60 tablet, Rfl: 1 .  metFORMIN (GLUCOPHAGE) 500 MG tablet, Take 1 tablet (500 mg total) by mouth daily with breakfast., Disp: 90 tablet, Rfl: 0 .  montelukast (SINGULAIR) 10 MG tablet, Take 1 tablet (10 mg total) by mouth at bedtime., Disp: 30 tablet, Rfl: 0 .  oxyCODONE-acetaminophen (PERCOCET/ROXICET) 5-325 MG tablet, Take  1 tab  po q 6 hours prn pain, Disp: 30 tablet, Rfl: 0 .  phenazopyridine (PYRIDIUM) 100 MG tablet, Take  1 tablet (100 mg total) by mouth 3 (three) times daily as needed for pain., Disp: 10 tablet, Rfl: 0 .  SUMAtriptan (IMITREX) 25 MG tablet, Take 25 mg by mouth every 2 (two) hours as needed for migraine or headache. , Disp: , Rfl: 1 .  traMADol (ULTRAM) 50 MG tablet, Take 1 tablet (50 mg total) by mouth every 6 (six) hours as needed. (Patient taking differently: Take 50 mg by mouth every 6 (six) hours as needed (for pain.). ), Disp: 60 tablet, Rfl: 2 .  Vitamin D, Ergocalciferol, (DRISDOL) 1.25 MG (50000 UT) CAPS capsule, Take 1 capsule (50,000 Units total) by mouth every 7 (seven) days., Disp: 4 capsule, Rfl: 0  Current Facility-Administered Medications:  .  0.9 %  sodium chloride infusion, 500 mL, Intravenous, Continuous, Nandigam, Venia Minks, MD  Social  History   Tobacco Use  Smoking Status Never Smoker  Smokeless Tobacco Never Used    Allergies  Allergen Reactions  . Kiwi Extract Itching and Other (See Comments)    Itching in mouth with KIWI, And pineapple juice- topically irritating    Objective:  There were no vitals filed for this visit. There is no height or weight on file to calculate BMI. Constitutional Well developed. Well nourished.  Vascular Dorsalis pedis pulses palpable bilaterally. Posterior tibial pulses palpable bilaterally. Capillary refill normal to all digits.  No cyanosis or clubbing noted. Pedal hair growth normal.  Neurologic Normal speech. Oriented to person, place, and time. Epicritic sensation to light touch grossly present bilaterally.  Dermatologic Nails well groomed and normal in appearance. No open wounds. Xerosis with scaling plantar foot right.  Orthopedic: Normal joint ROM without pain or crepitus bilaterally. No visible deformities. Tender to palpation at the calcaneal tuber bilaterally. No pain with calcaneal squeeze bilaterally. Ankle ROM diminished range of motion bilaterally. Silfvers[place]old Test: positive bilaterally.   Assessment:   1. Plantar fasciitis   2. Tinea pedis of right foot    Plan:  Patient was evaluated and treated and all questions answered.  Plantar Fasciitis, bilaterally - Injection delivered to the right plantar fascia as below. - DME: Night splint dispensed. - Would benefit from new CMOs  Procedure: Injection Tendon/Ligament Location: Right plantar fascia at the glabrous junction; medial approach. Skin Prep: alcohol Injectate: 1 cc 0.5% marcaine plain, 1 cc dexamethasone phosphate, 0.5 cc kenalog 10. Disposition: Patient tolerated procedure well. Injection site dressed with a band-aid.  Tinea Pedis -Continue ketoconazole.  Return if symptoms worsen or fail to improve.

## 2018-10-16 NOTE — Progress Notes (Signed)
Office: [phone]  /  Fax: [phone]   HPI:   Chief Complaint: OBESITY Andrea Montes is here to discuss her progress with her obesity treatment plan. She is on the Category 3 plan and is following her eating plan approximately 75 % of the time. She states she is exercising 0 minutes 0 times per week. Andrea Montes has cough especially at night with initiation of lisinopril. She has had numerous indulgences recently with many birthday celebrations including her own. She is not eating all the food on the plan.  Her weight is 264 lb (119.7 kg) today and has not lost weight since her last visit. She has lost 6 lbs since starting treatment with [name].  Diabetes II with Hyperglycemia and not on Insulin Andrea Montes has a diagnosis of diabetes type II. Andrea Montes's last A1c was 6.3 on 07/09/18. She denies any GI side effects of metformin. Andrea Montes has had a cough recently with the initiation of lisinopril. She has been working on intensive lifestyle modifications including diet, exercise, and weight loss to help control her blood glucose levels.  Vitamin D deficiency Andrea Montes has a diagnosis of vitamin D deficiency. She is currently taking vit D. She admits fatigue and denies nausea, vomiting, or muscle weakness.  At risk for osteopenia and osteoporosis Andrea Montes is at higher risk of osteopenia and osteoporosis due to vitamin D deficiency.   Iron Deficiency Anemia Andrea Montes has a diagnosis of anemia. She is not on iron supplementation. Her last hemoglobin and hematocrit was stable.  ASSESSMENT AND PLAN:  Type 2 diabetes mellitus with hyperglycemia, without long-term current use of insulin ([name]) - Plan: Comprehensive metabolic panel, Hemoglobin A1c, Insulin, random, Lipid Panel With LDL/HDL Ratio  Vitamin D deficiency - Plan: VITAMIN D 25 Hydroxy (Vit-D Deficiency, Fractures), Vitamin B12, Vitamin D, Ergocalciferol, (DRISDOL) 1.25 MG (50000 UT) CAPS capsule  Other iron deficiency anemia - Plan: CBC With Differential  At  risk for osteoporosis  Class 3 severe obesity with serious comorbidity and body mass index (BMI) of 45.0 to 49.9 in adult, unspecified obesity type ()  PLAN:  Diabetes II with Hyperglycemia and not on Insulin Andrea Montes has agreed to stop lisinopril and she has been given extensive diabetes education by myself today including ideal fasting and post-prandial blood glucose readings, individual ideal Hgb A1c goals, and hypoglycemia prevention. We discussed the importance of good blood sugar control to decrease the likelihood of diabetic complications such as nephropathy, neuropathy, limb loss, blindness, coronary artery disease, and death. We discussed the importance of intensive lifestyle modification including diet, exercise and weight loss as the first line treatment for diabetes. We ordered a CMP, Hgb A1c, fasting Insulin, FLP, and B12 today. Andrea Montes agrees to continue her diabetes medications and will follow up at the agreed upon time in 2 weeks.  Vitamin D Deficiency Andrea Montes was informed that low vitamin D levels contributes to fatigue and are associated with obesity, breast, and colon cancer. Andrea Montes agrees to continue to take prescription Vit D @50 ,000 IU every week #4 with no refills and will follow up for routine testing of vitamin D, at least 2-3 times per year. She was informed of the risk of over-replacement of vitamin D and agrees to not increase her dose unless she discusses this with [name] first. A vitamin D level was ordered today. Andrea Montes agrees to follow up in 2 weeks as directed.  At risk for osteopenia and osteoporosis Andrea Montes was given extended (15 minutes) osteoporosis prevention counseling today. Andrea Montes is at risk for osteopenia and osteoporosis  due to her vitamin D deficiency. She was encouraged to take her vitamin D and follow her higher calcium diet and increase strengthening exercise to help strengthen her bones and decrease her risk of osteopenia and osteoporosis.  Iron Deficiency  Anemia The diagnosis of Iron deficiency anemia was discussed with Andrea Montes and was explained in detail. She was given suggestions of iron rich foods and iron supplement was not prescribed. A CBC will be drawn today and she agrees to follow up as directed.  Obesity Andrea Montes is currently in the action stage of change. As such, her goal is to continue with weight loss efforts. She has agreed to follow the Category 3 plan. Andrea Montes has been instructed to work up to a goal of 150 minutes of combined cardio and strengthening exercise per week for weight loss and overall health benefits. We discussed the following Behavioral Modification Strategies today: increasing lean protein intake, increasing vegetables, planning for success, and work on meal planning and easy cooking plans. Andrea Montes agrees to follow the plan at 100% 5 of 7 days of the week. The other 2 days, she will eat 2 of the 3 meals at 100%.  Andrea Montes has agreed to follow up with our clinic in 2 weeks. She was informed of the importance of frequent follow up visits to maximize her success with intensive lifestyle modifications for her multiple health conditions.  ALLERGIES: Allergies  Allergen Reactions  . Kiwi Extract Itching and Other (See Comments)    Itching in mouth with KIWI, And pineapple juice- topically irritating     MEDICATIONS: Current Outpatient Medications on File Prior to Visit  Medication Sig Dispense Refill  . albuterol (PROVENTIL HFA;VENTOLIN HFA) 108 (90 Base) MCG/ACT inhaler Inhale 2 puffs into the lungs every 6 (six) hours as needed for up to 10 days for wheezing or shortness of breath. 1 Inhaler 0  . Aspirin-Acetaminophen-Caffeine (GOODY HEADACHE PO) Take 1 Package by mouth as needed (pain).    [name] atorvastatin (LIPITOR) 10 MG tablet Take 1 tablet (10 mg total) by mouth at bedtime. 30 tablet 0  . bacitracin 500 UNIT/GM ointment Apply 1 application topically 2 (two) times daily. 15 g 0  . calcium carbonate (TUMS EX) 750 MG  chewable tablet Chew 1-2 tablets by mouth 2 (two) times daily as needed for heartburn.    . furosemide (LASIX) 20 MG tablet Take 1 tablet (20 mg total) by mouth daily. (Patient taking differently: Take 20 mg by mouth daily as needed for fluid. ) 30 tablet 11  . meloxicam (MOBIC) 15 MG tablet Take 1 tablet (15 mg total) by mouth daily as needed for pain. (Patient taking differently: Take 15 mg by mouth daily. ) 60 tablet 1  . metFORMIN (GLUCOPHAGE) 500 MG tablet Take 1 tablet (500 mg total) by mouth daily with breakfast. 90 tablet 0  . montelukast (SINGULAIR) 10 MG tablet Take 1 tablet (10 mg total) by mouth at bedtime. 30 tablet 0  . oxyCODONE-acetaminophen (PERCOCET/ROXICET) 5-325 MG tablet Take  1 tab  po q 6 hours prn pain 30 tablet 0  . phenazopyridine (PYRIDIUM) 100 MG tablet Take 1 tablet (100 mg total) by mouth 3 (three) times daily as needed for pain. 10 tablet 0  . SUMAtriptan (IMITREX) 25 MG tablet Take 25 mg by mouth every 2 (two) hours as needed for migraine or headache.   1  . traMADol (ULTRAM) 50 MG tablet Take 1 tablet (50 mg total) by mouth every 6 (six) hours as needed. (Patient taking  differently: Take 50 mg by mouth every 6 (six) hours as needed (for pain.). ) 60 tablet 2  . Vitamin D, Ergocalciferol, (DRISDOL) 1.25 MG (50000 UT) CAPS capsule Take 1 capsule (50,000 Units total) by mouth every 7 (seven) days. 4 capsule 0   Current Facility-Administered Medications on File Prior to Visit  Medication Dose Route Frequency Provider Last Rate Last Dose  . 0.9 %  sodium chloride infusion  500 mL Intravenous Continuous Nandigam, Venia Minks, MD        PAST MEDICAL HISTORY: Past Medical History:  Diagnosis Date  . Anemia    no current med.  [name] Apnea   . 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 Back pain   . Carpal tunnel syndrome   . Complication of anesthesia    itching from last surgery that was completed at the surgery center off of [address] patient was  given benedryl, generalized itching and they are not sure where that came from  . DDD (degenerative disc disease), lumbar   . Dental crowns present   . Edema of lower extremity    bilateral  . Frozen shoulder    surgery 11-04-14  . GERD (gastroesophageal reflux disease)    no current med.  [name] GERD (gastroesophageal reflux disease)   . Heart murmur    last  echo- 2010  . History of blood transfusion   . History of shingles   . Hypertension    borderline not on medicaiton at this time  . Impingement syndrome of left shoulder region 10/2014  . Joint pain   . Migraines   . Obesity   . Pre-diabetes   . Rheumatoid arteritis ([place])   . Seasonal allergies   . Sleep apnea    uses CPAP nightly  . Trigger finger, left middle finger   . Vitamin D deficiency   . Wears partial dentures    upper    PAST SURGICAL HISTORY: Past Surgical History:  Procedure Laterality Date  . CARPAL TUNNEL RELEASE Left 04/25/2006  . CARPAL TUNNEL RELEASE Right 07/15/2018   Procedure: RIGHT CARPAL TUNNEL RELEASE;  Surgeon: Meredith Pel, MD;  Location: [facility];  Service: Orthopedics;  Laterality: Right;  . CLOSED MANIPULATION SHOULDER WITH STERIOD INJECTION Left 02/03/2015   Procedure: LEFT  MANIPULATION SHOULDER UNDER ANESTHESIA WITH INJECTION OF STEROID;  Surgeon: Kathryne Hitch, MD;  Location: [facility];  Service: Orthopedics;  Laterality: Left;  . COLONOSCOPY    . COLONOSCOPY WITH PROPOFOL  10/09/2011  . POLYPECTOMY    . RESECTION DISTAL CLAVICAL Left 11/04/2014   Procedure: RESECTION DISTAL CLAVICAL;  Surgeon: Kathryne Hitch, MD;  Location: [facility];  Service: Orthopedics;  Laterality: Left;  . ROTATOR CUFF REPAIR  2010  . SHOULDER ARTHROSCOPY WITH ROTATOR CUFF REPAIR Right 10/04/2008  . SHOULDER ARTHROSCOPY WITH SUBACROMIAL DECOMPRESSION, ROTATOR CUFF REPAIR AND BICEP TENDON REPAIR Left 11/04/2014   Procedure: LEFT SHOULDER ARTHROSCOPY WITH DEBRIDEMENT, DISTAL CLAVICLE  EXCISION, ACROMIOPLASTY, ROTATOR CUFF REPAIR AND BICEP TENODESIS;  Surgeon: Kathryne Hitch, MD;  Location: [facility];  Service: Orthopedics;  Laterality: Left;  . TRIGGER FINGER RELEASE Right 02/03/2015   Procedure: RIGHT LONG FINGER TRIGGER RELEASE;  Surgeon: Kathryne Hitch, MD;  Location: [place] [facility];  Service: Orthopedics;  Laterality: Right;  . TRIGGER FINGER RELEASE Left 10/10/2017   Procedure: LEFT HAND RELEASE TRIGGER FINGER 3RD LONG FINGER AND CYST EXCISION;  Surgeon: Meredith Pel, MD;  Location: [facility];  Service: Orthopedics;  Laterality: Left;  . TUBAL LIGATION    . VAGINAL HYSTERECTOMY  2001   partial    SOCIAL HISTORY: Social History   Tobacco Use  . Smoking status: Never Smoker  . Smokeless tobacco: Never Used  Substance Use Topics  . Alcohol use: No  . Drug use: No    FAMILY HISTORY: Family History  Problem Relation Age of Onset  . Kidney disease Brother   . Hypertension Brother   . Leukemia Mother   . Heart disease Mother   . Stroke Mother   . Cancer Mother   . Depression Mother   . Anxiety disorder Mother   . AAA (abdominal aortic aneurysm) Father   . Esophageal cancer Maternal Uncle   . Hypertension Brother   . Colon cancer Neg Hx   . Colon polyps Neg Hx   . Rectal cancer Neg Hx   . Stomach cancer Neg Hx     ROS: Review of Systems  Constitutional: Positive for malaise/fatigue. Negative for weight loss.  Gastrointestinal: Negative for nausea and vomiting.  Musculoskeletal:       Negative for muscle weakness.    PHYSICAL EXAM: Blood pressure 123/74, pulse 66, temperature 97.7 F (36.5 C), temperature source Oral, height 5\' 3"  (1.6 m), weight 264 lb (119.7 kg), SpO2 96 %. Body mass index is 46.77 kg/m. Physical Exam Vitals signs reviewed.  Constitutional:      Appearance: Normal appearance. She is obese.  Cardiovascular:     Rate and Rhythm: Normal rate.  Pulmonary:     Effort: Pulmonary effort is normal.    Musculoskeletal: Normal range of motion.  Skin:    General: Skin is warm and dry.  Neurological:     Mental Status: She is alert and oriented to person, place, and time.  Psychiatric:        Mood and Affect: Mood normal.        Behavior: Behavior normal.     RECENT LABS AND TESTS: BMET    Component Value Date/Time   NA 140 07/09/2018 0925   NA 139 05/07/2018 1424   K 4.2 07/09/2018 0925   CL 108 07/09/2018 0925   CO2 25 07/09/2018 0925   GLUCOSE 124 (H) 07/09/2018 0925   BUN 10 07/09/2018 0925   BUN 10 05/07/2018 1424   CREATININE 0.85 07/09/2018 0925   CREATININE 0.85 03/07/2016 1654   CALCIUM 9.0 07/09/2018 0925   GFRNONAA >60 07/09/2018 0925   GFRAA >60 07/09/2018 0925   Lab Results  Component Value Date   HGBA1C 6.3 (H) 07/09/2018   HGBA1C 6.7 (H) 05/07/2018   Lab Results  Component Value Date   INSULIN 39.8 (H) 05/07/2018   CBC    Component Value Date/Time   WBC 7.5 07/09/2018 0925   RBC 6.24 (H) 07/09/2018 0925   HGB 13.1 07/09/2018 0925   HGB 11.8 05/07/2018 1424   HCT 44.8 07/09/2018 0925   HCT 36.9 05/07/2018 1424   PLT 270 07/09/2018 0925   MCV 71.8 (L) 07/09/2018 0925   MCV 69 (L) 05/07/2018 1424   MCH 21.0 (L) 07/09/2018 0925   MCHC 29.2 (L) 07/09/2018 0925   RDW 15.1 07/09/2018 0925   RDW 16.4 (H) 05/07/2018 1424   LYMPHSABS 2.9 05/07/2018 1424   MONOABS 0.5 07/14/2017 2115   EOSABS 0.1 05/07/2018 1424   BASOSABS 0.0 05/07/2018 1424   Iron/TIBC/Ferritin/ %Sat No results found for: IRON, TIBC, FERRITIN, IRONPCTSAT Lipid Panel     Component Value Date/Time  CHOL 130 05/07/2018 1424   TRIG 89 05/07/2018 1424   HDL 35 (L) 05/07/2018 1424   CHOLHDL 4.1 Ratio 09/26/2009 1823   VLDL 26 09/26/2009 1823   LDLCALC 77 05/07/2018 1424   Hepatic Function Panel     Component Value Date/Time   PROT 6.9 05/07/2018 1424   ALBUMIN 3.7 05/07/2018 1424   AST 17 05/07/2018 1424   ALT 6 05/07/2018 1424   ALKPHOS 93 05/07/2018 1424   BILITOT 0.2  05/07/2018 1424      Component Value Date/Time   TSH 1.470 05/07/2018 1424   TSH 1.55 11/25/2008 1700   Results for MCKYNZIE, LIWANAG (MRN [id number]) as of 10/16/2018 11:26  Ref. Range 05/07/2018 14:24  Vitamin D, 25-Hydroxy Latest Ref Range: 30.0 - 100.0 ng/mL 14.4 (L)    OBESITY BEHAVIORAL INTERVENTION VISIT  Today's visit was # 10   Starting weight: 270 lbs Starting date: 05/07/18 Today's weight : Weight: 264 lb (119.7 kg)  Today's date: 10/16/2018 Total lbs lost to date: 6    10/16/2018  Height 5\' 3"  (1.6 m)  Weight 264 lb (119.7 kg)  BMI (Calculated) 46.78  BLOOD PRESSURE - SYSTOLIC 099  BLOOD PRESSURE - DIASTOLIC 74   Body Fat % 83.3 %  Total Body Water (lbs) 95.4 lbs    ASK: We discussed the diagnosis of obesity with [name] today and Merrilee agreed to give [name] permission to discuss obesity behavioral modification therapy today.  ASSESS: Maliya has the diagnosis of obesity and her BMI today is 46.78. Janvi is in the action stage of change.   ADVISE: Jlee was educated on the multiple health risks of obesity as well as the benefit of weight loss to improve her health. She was advised of the need for long term treatment and the importance of lifestyle modifications to improve her current health and to decrease her risk of future health problems.  AGREE: Multiple dietary modification options and treatment options were discussed and Braylie agreed to follow the recommendations documented in the above note.  ARRANGE: Janette was educated on the importance of frequent visits to treat obesity as outlined per CMS and USPSTF guidelines and agreed to schedule her next follow up appointment today.  Lenward Chancellor, CMA, am acting as transcriptionist for Eber Jones, MD  I have reviewed the above documentation for accuracy and completeness, and I agree with the above. - Ilene Qua, MD

## 2018-10-17 LAB — CBC WITH DIFFERENTIAL
Basophils Absolute: 0 10*3/uL (ref 0.0–0.2)
Basos: 0 %
EOS (ABSOLUTE): 0.2 10*3/uL (ref 0.0–0.4)
Eos: 2 %
HEMATOCRIT: 40.5 % (ref 34.0–46.6)
HEMOGLOBIN: 12.5 g/dL (ref 11.1–15.9)
Immature Grans (Abs): 0 10*3/uL (ref 0.0–0.1)
Immature Granulocytes: 0 %
LYMPHS ABS: 2.6 10*3/uL (ref 0.7–3.1)
Lymphs: 33 %
MCH: 21.5 pg — AB (ref 26.6–33.0)
MCHC: 30.9 g/dL — AB (ref 31.5–35.7)
MCV: 70 fL — ABNORMAL LOW (ref 79–97)
MONOCYTES: 7 %
Monocytes Absolute: 0.6 10*3/uL (ref 0.1–0.9)
Neutrophils Absolute: 4.5 10*3/uL (ref 1.4–7.0)
Neutrophils: 58 %
RBC: 5.81 x10E6/uL — ABNORMAL HIGH (ref 3.77–5.28)
RDW: 17.7 % — ABNORMAL HIGH (ref 11.7–15.4)
WBC: 7.9 10*3/uL (ref 3.4–10.8)

## 2018-10-17 LAB — COMPREHENSIVE METABOLIC PANEL
A/G RATIO: 1.2 (ref 1.2–2.2)
ALBUMIN: 3.9 g/dL (ref 3.8–4.8)
ALT: 7 IU/L (ref 0–32)
AST: 12 IU/L (ref 0–40)
Alkaline Phosphatase: 92 IU/L (ref 39–117)
BUN/Creatinine Ratio: 22 (ref 12–28)
BUN: 15 mg/dL (ref 8–27)
Bilirubin Total: 0.3 mg/dL (ref 0.0–1.2)
CALCIUM: 9.1 mg/dL (ref 8.7–10.3)
CO2: 25 mmol/L (ref 20–29)
Chloride: 102 mmol/L (ref 96–106)
Creatinine, Ser: 0.67 mg/dL (ref 0.57–1.00)
GFR, EST AFRICAN AMERICAN: 110 mL/min/{1.73_m2} (ref >59)
GFR, EST NON AFRICAN AMERICAN: 95 mL/min/{1.73_m2} (ref >59)
Globulin, Total: 3.2 g/dL (ref 1.5–4.5)
Glucose: 102 mg/dL — ABNORMAL HIGH (ref 65–99)
POTASSIUM: 4.5 mmol/L (ref 3.5–5.2)
Sodium: 141 mmol/L (ref 134–144)
TOTAL PROTEIN: 7.1 g/dL (ref 6.0–8.5)

## 2018-10-17 LAB — LIPID PANEL WITH LDL/HDL RATIO
CHOLESTEROL TOTAL: 152 mg/dL (ref 100–199)
HDL: 37 mg/dL — AB (ref >39)
LDL Calculated: 100 mg/dL — ABNORMAL HIGH (ref 0–99)
LDL/HDL RATIO: 2.7 ratio (ref 0.0–3.2)
TRIGLYCERIDES: 75 mg/dL (ref 0–149)
VLDL Cholesterol Cal: 15 mg/dL (ref 5–40)

## 2018-10-17 LAB — VITAMIN B12: Vitamin B-12: 713 pg/mL (ref 232–1245)

## 2018-10-17 LAB — VITAMIN D 25 HYDROXY (VIT D DEFICIENCY, FRACTURES): Vit D, 25-Hydroxy: 30.2 ng/mL (ref 30.0–100.0)

## 2018-10-17 LAB — HEMOGLOBIN A1C
Est. average glucose Bld gHb Est-mCnc: 137 mg/dL
Hgb A1c MFr Bld: 6.4 % — ABNORMAL HIGH (ref 4.8–5.6)

## 2018-10-17 LAB — INSULIN, RANDOM: INSULIN: 44.1 u[IU]/mL — ABNORMAL HIGH (ref 2.6–24.9)

## 2018-10-26 ENCOUNTER — Ambulatory Visit: Payer: Self-pay | Admitting: Nurse Practitioner

## 2018-10-26 VITALS — BP 127/70 | HR 85 | Temp 98.4°F | Resp 18 | Wt 267.4 lb

## 2018-10-26 DIAGNOSIS — R300 Dysuria: Secondary | ICD-10-CM

## 2018-10-26 DIAGNOSIS — N3000 Acute cystitis without hematuria: Secondary | ICD-10-CM

## 2018-10-26 LAB — POCT URINALYSIS DIPSTICK
BILIRUBIN UA: NEGATIVE
Blood, UA: NEGATIVE
CLARITY UA: NEGATIVE
Color, UA: NEGATIVE
Glucose, UA: NEGATIVE
KETONES UA: NEGATIVE
Nitrite, UA: NEGATIVE
PROTEIN UA: NEGATIVE
Spec Grav, UA: 1.015 (ref 1.010–1.025)
Urobilinogen, UA: 0.2 E.U./dL
pH, UA: 5 (ref 5.0–8.0)

## 2018-10-26 MED ORDER — NITROFURANTOIN MONOHYD MACRO 100 MG PO CAPS: 100.0000 mg | ORAL_CAPSULE | Freq: Two times a day (BID) | ORAL | 0 refills | Status: AC

## 2018-10-26 NOTE — Progress Notes (Signed)
Subjective:    Andrea Montes is a 63 y.o. female who complains of dysuria, low back pain, frequency, hesitancy, suprapubic pressure and urgency for 4 days.  Patient denies hematuria, congestion, cough, fever, stomach ache, vaginal discharge and fever, chills, abdominal pain, nausea, vomiting, foul-smelling urine or nocturia.  Patient's last urinary tract infection was in November, 2019.  Patient denies past history of kidney stones or pyelonephritis.  The patient also denies chance of STD/STI as she is not sexually active.   Past Medical History:  Diagnosis Date  . Anemia    no current med.  [name] Apnea   . 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 Back pain   . Carpal tunnel syndrome   . Complication of anesthesia    itching from last surgery that was completed at the surgery center off of [address] patient was given benedryl, generalized itching and they are not sure where that came from  . DDD (degenerative disc disease), lumbar   . Dental crowns present   . Edema of lower extremity    bilateral  . Frozen shoulder    surgery 11-04-14  . GERD (gastroesophageal reflux disease)    no current med.  [name] GERD (gastroesophageal reflux disease)   . Heart murmur    last  echo- 2010  . History of blood transfusion   . History of shingles   . Hypertension    borderline not on medicaiton at this time  . Impingement syndrome of left shoulder region 10/2014  . Joint pain   . Migraines   . Obesity   . Pre-diabetes   . Rheumatoid arteritis ([place])   . Seasonal allergies   . Sleep apnea    uses CPAP nightly  . Trigger finger, left middle finger   . Vitamin D deficiency   . Wears partial dentures    upper    Current Outpatient Medications:  .  Aspirin-Acetaminophen-Caffeine (GOODY HEADACHE PO), Take 1 Package by mouth as needed (pain)., Disp: , Rfl:  .  atorvastatin (LIPITOR) 10 MG tablet, Take 1 tablet (10 mg total) by mouth at bedtime., Disp: 30 tablet, Rfl: 0 .   bacitracin 500 UNIT/GM ointment, Apply 1 application topically 2 (two) times daily., Disp: 15 g, Rfl: 0 .  calcium carbonate (TUMS EX) 750 MG chewable tablet, Chew 1-2 tablets by mouth 2 (two) times daily as needed for heartburn., Disp: , Rfl:  .  furosemide (LASIX) 20 MG tablet, Take 1 tablet (20 mg total) by mouth daily. (Patient taking differently: Take 20 mg by mouth daily as needed for fluid. ), Disp: 30 tablet, Rfl: 11 .  meloxicam (MOBIC) 15 MG tablet, Take 1 tablet (15 mg total) by mouth daily as needed for pain. (Patient taking differently: Take 15 mg by mouth daily. ), Disp: 60 tablet, Rfl: 1 .  metFORMIN (GLUCOPHAGE) 500 MG tablet, Take 1 tablet (500 mg total) by mouth daily with breakfast., Disp: 90 tablet, Rfl: 0 .  SUMAtriptan (IMITREX) 25 MG tablet, Take 25 mg by mouth every 2 (two) hours as needed for migraine or headache. , Disp: , Rfl: 1 .  traMADol (ULTRAM) 50 MG tablet, Take 1 tablet (50 mg total) by mouth every 6 (six) hours as needed. (Patient taking differently: Take 50 mg by mouth every 6 (six) hours as needed (for pain.). ), Disp: 60 tablet, Rfl: 2 .  Vitamin D, Ergocalciferol, (DRISDOL) 1.25 MG (50000 UT) CAPS capsule, Take 1 capsule (50,000 Units total) by mouth  every 7 (seven) days., Disp: 4 capsule, Rfl: 0 .  albuterol (PROVENTIL HFA;VENTOLIN HFA) 108 (90 Base) MCG/ACT inhaler, Inhale 2 puffs into the lungs every 6 (six) hours as needed for up to 10 days for wheezing or shortness of breath., Disp: 1 Inhaler, Rfl: 0 .  montelukast (SINGULAIR) 10 MG tablet, Take 1 tablet (10 mg total) by mouth at bedtime. (Patient not taking: Reported on 10/26/2018), Disp: 30 tablet, Rfl: 0 .  nitrofurantoin, macrocrystal-monohydrate, (MACROBID) 100 MG capsule, Take 1 capsule (100 mg total) by mouth 2 (two) times daily for 5 days., Disp: 10 capsule, Rfl: 0 .  oxyCODONE-acetaminophen (PERCOCET/ROXICET) 5-325 MG tablet, Take  1 tab  po q 6 hours prn pain (Patient not taking: Reported on 10/26/2018),  Disp: 30 tablet, Rfl: 0 .  phenazopyridine (PYRIDIUM) 100 MG tablet, Take 1 tablet (100 mg total) by mouth 3 (three) times daily as needed for pain. (Patient not taking: Reported on 10/26/2018), Disp: 10 tablet, Rfl: 0  Current Facility-Administered Medications:  .  0.9 %  sodium chloride infusion, 500 mL, Intravenous, Continuous, Nandigam, Kavitha V, MD  Review of Systems Constitutional: positive for fatigue, negative for anorexia, chills, malaise, night sweats and sweats Eyes: negative Ears, nose, mouth, throat, and face: negative Respiratory: negative Cardiovascular: negative Gastrointestinal: negative Genitourinary:positive for dysuria, frequency and urgency and hesitancy, negative for vaginal discharge, decreased stream, nocturia and urinary incontinence Neurological: negative    Objective:    BP 127/70 (BP Location: Left Arm, Patient Position: Sitting, Cuff Size: Large)   Pulse 85   Temp 98.4 F (36.9 C) (Oral)   Resp 18   Wt 267 lb 6.4 oz (121.3 kg)   SpO2 96%   BMI 47.37 kg/m  Physical Exam Vitals signs reviewed.  Constitutional:      General: She is not in acute distress. HENT:     Head: Normocephalic.     Right Ear: Tympanic membrane, ear canal and external ear normal.     Left Ear: Tympanic membrane, ear canal and external ear normal.     Nose: Nose normal.     Mouth/Throat:     Mouth: Mucous membranes are moist.  Eyes:     Pupils: Pupils are equal, round, and reactive to light.  Neck:     Musculoskeletal: Normal range of motion and neck supple.  Cardiovascular:     Rate and Rhythm: Normal rate and regular rhythm.     Pulses: Normal pulses.     Heart sounds: Normal heart sounds.  Pulmonary:     Effort: Pulmonary effort is normal. No respiratory distress.     Breath sounds: Normal breath sounds. No wheezing, rhonchi or rales.  Abdominal:     General: Bowel sounds are normal.     Palpations: Abdomen is soft.     Tenderness: There is no abdominal tenderness.  There is no right CVA tenderness or left CVA tenderness.  Lymphadenopathy:     Cervical: No cervical adenopathy.  Skin:    General: Skin is warm and dry.  Neurological:     General: No focal deficit present.     Mental Status: She is alert and oriented to person, place, and time.  Psychiatric:        Mood and Affect: Mood normal.        Thought Content: Thought content normal.    Results for orders placed or performed in visit on 10/26/18 (from the past 24 hour(s))  POCT urinalysis dipstick     Status: Abnormal  Collection Time: 10/26/18  3:21 PM  Result Value Ref Range   Color, UA NEGATIVE    Clarity, UA NEGATIVE    Glucose, UA Negative Negative   Bilirubin, UA NEGATIVE    Ketones, UA NEGATIVE    Spec Grav, UA 1.015 1.010 - 1.025   Blood, UA NEGATIVE    pH, UA 5.0 5.0 - 8.0   Protein, UA Negative Negative   Urobilinogen, UA 0.2 0.2 or 1.0 E.U./dL   Nitrite, UA NEGATIVE    Leukocytes, UA Large (3+) (A) Negative   Appearance     Odor      Assessment:   Acute Cystitis    Plan:   Exam findings, diagnosis etiology and medication use and indications reviewed with patient. Follow- Up and discharge instructions provided. No emergent/urgent issues found on exam.  The patient's physical exam, symptoms, urinalysis, are consistent with that of an acute cystitis.  The patient does not display any concerns to include CVA tenderness, fever, chills, as there is no concern for pyelonephritis or kidney stones.  I did discuss with the patient that she will need to monitor her symptoms to determine if she needs to follow-up with her PCP for improvement.  Also discussed with the patient that if she develops a urinary tract infection within the next 2 to 3 months, she should follow-up with her PCP or urology at that time.  Informed the patient that recurrent urinary tract infections should have a urine culture each time.  I am going to start the patient on Macrobid for 5 days.  Patient informed she  had good results with the previous prescription.  Patient was also instructed to increase fluids, develop a toileting schedule, and use ibuprofen or Tylenol for pain, fever, or general discomfort.  The patient is well-appearing, is in no acute distress, and vital signs are stable.  Patient education was provided. Patient verbalized understanding of information provided and agrees with plan of care (POC), all questions answered. The patient is advised to call or return to clinic if condition does not see an improvement in symptoms, or to seek the care of the closest emergency department if condition worsens with the above plan.   1. Dysuria  - POCT urinalysis dipstick  2. Acute cystitis without hematuria  - nitrofurantoin, macrocrystal-monohydrate, (MACROBID) 100 MG capsule; Take 1 capsule (100 mg total) by mouth 2 (two) times daily for 5 days.  Dispense: 10 capsule; Refill: 0 -Take medication as prescribed. -Ibuprofen or Tylenol for pain, fever, or general discomfort. -Develop a toileting schedule to void at least every 2 hours. -Avoid caffeine, to include tea, soda, and coffee. -Void approximately 15 to 20 minutes after sexual intercourse. -Increase fluids. -Follow-up with your PCP if symptoms do not improve.  At that time you may likely need a urine culture. Also if you develop urinary symptoms in the next several months, I would like you to follow up with a urologist or your PCP at that time as you most likely will need a urine culture. -Follow-up in our office as needed.

## 2018-10-26 NOTE — Patient Instructions (Signed)
Urinary Tract Infection, Adult -Take medication as prescribed. -Ibuprofen or Tylenol for pain, fever, or general discomfort. -Develop a toileting schedule to void at least every 2 hours. -Avoid caffeine, to include tea, soda, and coffee. -Void approximately 15 to 20 minutes after sexual intercourse. -Increase fluids. -Follow-up with your PCP if symptoms do not improve.  At that time you may likely need a urine culture. Also if you develop urinary symptoms in the next several months, I would like you to follow up with a urologist or your PCP at that time as you most likely will need a urine culture. -Follow-up in our office as needed.  A urinary tract infection (UTI) is an infection of any part of the urinary tract. The urinary tract includes the kidneys, ureters, bladder, and urethra. These organs make, store, and get rid of urine in the body. Your health care provider may use other names to describe the infection. An upper UTI affects the ureters and kidneys (pyelonephritis). A lower UTI affects the bladder (cystitis) and urethra (urethritis). What are the causes? Most urinary tract infections are caused by bacteria in your genital area, around the entrance to your urinary tract (urethra). These bacteria grow and cause inflammation of your urinary tract. What increases the risk? You are more likely to develop this condition if:  You have a urinary catheter that stays in place (indwelling).  You are not able to control when you urinate or have a bowel movement (you have incontinence).  You are female and you: ? Use a spermicide or diaphragm for birth control. ? Have low estrogen levels. ? Are pregnant.  You have certain genes that increase your risk (genetics).  You are sexually active.  You take antibiotic medicines.  You have a condition that causes your flow of urine to slow down, such as: ? An enlarged prostate, if you are female. ? Blockage in your urethra (stricture). ? A  kidney stone. ? A nerve condition that affects your bladder control (neurogenic bladder). ? Not getting enough to drink, or not urinating often.  You have certain medical conditions, such as: ? Diabetes. ? A weak disease-fighting system (immunesystem). ? Sickle cell disease. ? Gout. ? Spinal cord injury. What are the signs or symptoms? Symptoms of this condition include:  Needing to urinate right away (urgently).  Frequent urination or passing small amounts of urine frequently.  Pain or burning with urination.  Blood in the urine.  Urine that smells bad or unusual.  Trouble urinating.  Cloudy urine.  Vaginal discharge, if you are female.  Pain in the abdomen or the lower back. You may also have:  Vomiting or a decreased appetite.  Confusion.  Irritability or tiredness.  A fever.  Diarrhea. The first symptom in older adults may be confusion. In some cases, they may not have any symptoms until the infection has worsened. How is this diagnosed? This condition is diagnosed based on your medical history and a physical exam. You may also have other tests, including:  Urine tests.  Blood tests.  Tests for sexually transmitted infections (STIs). If you have had more than one UTI, a cystoscopy or imaging studies may be done to determine the cause of the infections. How is this treated? Treatment for this condition includes:  Antibiotic medicine.  Over-the-counter medicines to treat discomfort.  Drinking enough water to stay hydrated. If you have frequent infections or have other conditions such as a kidney stone, you may need to see a health care provider who  specializes in the urinary tract (urologist). In rare cases, urinary tract infections can cause sepsis. Sepsis is a life-threatening condition that occurs when the body responds to an infection. Sepsis is treated in the hospital with IV antibiotics, fluids, and other medicines. Follow these instructions at  home:  Medicines  Take over-the-counter and prescription medicines only as told by your health care provider.  If you were prescribed an antibiotic medicine, take it as told by your health care provider. Do not stop using the antibiotic even if you start to feel better. General instructions  Make sure you: ? Empty your bladder often and completely. Do not hold urine for long periods of time. ? Empty your bladder after sex. ? Wipe from front to back after a bowel movement if you are female. Use each tissue one time when you wipe.  Drink enough fluid to keep your urine pale yellow.  Keep all follow-up visits as told by your health care provider. This is important. Contact a health care provider if:  Your symptoms do not get better after 1-2 days.  Your symptoms go away and then return. Get help right away if you have:  Severe pain in your back or your lower abdomen.  A fever.  Nausea or vomiting. Summary  A urinary tract infection (UTI) is an infection of any part of the urinary tract, which includes the kidneys, ureters, bladder, and urethra.  Most urinary tract infections are caused by bacteria in your genital area, around the entrance to your urinary tract (urethra).  Treatment for this condition often includes antibiotic medicines.  If you were prescribed an antibiotic medicine, take it as told by your health care provider. Do not stop using the antibiotic even if you start to feel better.  Keep all follow-up visits as told by your health care provider. This is important. This information is not intended to replace advice given to you by your health care provider. Make sure you discuss any questions you have with your health care provider. Document Released: 05/16/2005 Document Revised: 02/13/2018 Document Reviewed: 02/13/2018 Elsevier Interactive Patient Education  2019 [employer].

## 2018-10-28 ENCOUNTER — Telehealth: Payer: Self-pay

## 2018-10-28 NOTE — Telephone Encounter (Signed)
Patient hang off the phone.

## 2018-10-30 MED FILL — VIT D2 1.25 MG (50,000 UNIT: 1.25 MG | 28 days supply | Qty: 4 | Fill #0

## 2018-10-31 ENCOUNTER — Ambulatory Visit: Payer: Self-pay | Admitting: Nurse Practitioner

## 2018-10-31 VITALS — BP 140/60 | HR 71 | Temp 98.4°F | Resp 16 | Wt 268.0 lb

## 2018-10-31 DIAGNOSIS — J329 Chronic sinusitis, unspecified: Secondary | ICD-10-CM

## 2018-10-31 DIAGNOSIS — B9789 Other viral agents as the cause of diseases classified elsewhere: Secondary | ICD-10-CM

## 2018-10-31 MED ORDER — FLUTICASONE PROPIONATE 50 MCG/ACT NA SUSP: 2.0000 | Freq: Every day | NASAL | 0 refills | Status: DC

## 2018-10-31 MED ORDER — MONTELUKAST SODIUM 10 MG PO TABS: 10.0000 mg | ORAL_TABLET | Freq: Every day | ORAL | 0 refills | Status: DC

## 2018-10-31 MED ORDER — PROMETHAZINE-DM 6.25-15 MG/5ML PO SYRP: 5.0000 mL | ORAL_SOLUTION | Freq: Four times a day (QID) | ORAL | 0 refills | Status: AC | PRN

## 2018-10-31 NOTE — Progress Notes (Signed)
Subjective:  Andrea Montes is a 63 y.o. female who presents for evaluation of possible sinusitis.  Symptoms include cough described as productive of yellow sputum, headache described as aching, pressure, nasal congestion, no fever, post nasal drip, sinus pressure, sinus pain, sneezing and fatigue, scratchy throat.  Patient denies fever, chills, malaise, sore throat, ear drainage, earache, wheezing, SOB, difficulty breathing, abdominal pain, nausea or vomiting. Onset of symptoms was 3 days ago, and has been unchanged since that time. Patient denies fever, chills, sore throat,  Treatment to date:  decongestants.  High risk factors for influenza complications:  co-morbid illness.  Patient has a history of seasonal allergies. The patient does have a recurrent history of sinusitis with her last diagnosis in June, 2019.    The following portions of the patient's history were reviewed and updated as appropriate:  allergies, current medications and past medical history.  Past Medical History:  Diagnosis Date  . Anemia    no current med.  [name] Apnea   . 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 Back pain   . Carpal tunnel syndrome   . Complication of anesthesia    itching from last surgery that was completed at the surgery center off of [address] patient was given benedryl, generalized itching and they are not sure where that came from  . DDD (degenerative disc disease), lumbar   . Dental crowns present   . Edema of lower extremity    bilateral  . Frozen shoulder    surgery 11-04-14  . GERD (gastroesophageal reflux disease)    no current med.  [name] GERD (gastroesophageal reflux disease)   . Heart murmur    last  echo- 2010  . History of blood transfusion   . History of shingles   . Hypertension    borderline not on medicaiton at this time  . Impingement syndrome of left shoulder region 10/2014  . Joint pain   . Migraines   . Obesity   . Pre-diabetes   . Rheumatoid  arteritis ([place])   . Seasonal allergies   . Sleep apnea    uses CPAP nightly  . Trigger finger, left middle finger   . Vitamin D deficiency   . Wears partial dentures    upper    Current Outpatient Medications:  .  atorvastatin (LIPITOR) 10 MG tablet, Take 1 tablet (10 mg total) by mouth at bedtime., Disp: 30 tablet, Rfl: 0 .  furosemide (LASIX) 20 MG tablet, Take 1 tablet (20 mg total) by mouth daily. (Patient taking differently: Take 20 mg by mouth daily as needed for fluid. ), Disp: 30 tablet, Rfl: 11 .  meloxicam (MOBIC) 15 MG tablet, Take 1 tablet (15 mg total) by mouth daily as needed for pain. (Patient taking differently: Take 15 mg by mouth daily. ), Disp: 60 tablet, Rfl: 1 .  metFORMIN (GLUCOPHAGE) 500 MG tablet, Take 1 tablet (500 mg total) by mouth daily with breakfast., Disp: 90 tablet, Rfl: 0 .  montelukast (SINGULAIR) 10 MG tablet, Take 1 tablet (10 mg total) by mouth at bedtime., Disp: 30 tablet, Rfl: 0 .  nitrofurantoin, macrocrystal-monohydrate, (MACROBID) 100 MG capsule, Take 1 capsule (100 mg total) by mouth 2 (two) times daily for 5 days., Disp: 10 capsule, Rfl: 0 .  phenazopyridine (PYRIDIUM) 100 MG tablet, Take 1 tablet (100 mg total) by mouth 3 (three) times daily as needed for pain., Disp: 10 tablet, Rfl: 0 .  Vitamin D, Ergocalciferol, (DRISDOL) 1.25 MG (50000  UT) CAPS capsule, Take 1 capsule (50,000 Units total) by mouth every 7 (seven) days., Disp: 4 capsule, Rfl: 0 .  albuterol (PROVENTIL HFA;VENTOLIN HFA) 108 (90 Base) MCG/ACT inhaler, Inhale 2 puffs into the lungs every 6 (six) hours as needed for up to 10 days for wheezing or shortness of breath., Disp: 1 Inhaler, Rfl: 0 .  Aspirin-Acetaminophen-Caffeine (GOODY HEADACHE PO), Take 1 Package by mouth as needed (pain)., Disp: , Rfl:  .  bacitracin 500 UNIT/GM ointment, Apply 1 application topically 2 (two) times daily. (Patient not taking: Reported on 10/31/2018), Disp: 15 g, Rfl: 0 .  calcium carbonate (TUMS EX) 750  MG chewable tablet, Chew 1-2 tablets by mouth 2 (two) times daily as needed for heartburn., Disp: , Rfl:  .  fluticasone (FLONASE) 50 MCG/ACT nasal spray, Place 2 sprays into both nostrils daily for 10 days., Disp: 16 g, Rfl: 0 .  montelukast (SINGULAIR) 10 MG tablet, Take 1 tablet (10 mg total) by mouth at bedtime for 30 days., Disp: 30 tablet, Rfl: 0 .  oxyCODONE-acetaminophen (PERCOCET/ROXICET) 5-325 MG tablet, Take  1 tab  po q 6 hours prn pain (Patient not taking: Reported on 10/26/2018), Disp: 30 tablet, Rfl: 0 .  promethazine-dextromethorphan (PROMETHAZINE-DM) 6.25-15 MG/5ML syrup, Take 5 mLs by mouth 4 (four) times daily as needed for up to 7 days., Disp: 140 mL, Rfl: 0 .  SUMAtriptan (IMITREX) 25 MG tablet, Take 25 mg by mouth every 2 (two) hours as needed for migraine or headache. , Disp: , Rfl: 1 .  traMADol (ULTRAM) 50 MG tablet, Take 1 tablet (50 mg total) by mouth every 6 (six) hours as needed. (Patient not taking: Reported on 10/31/2018), Disp: 60 tablet, Rfl: 2  Current Facility-Administered Medications:  .  0.9 %  sodium chloride infusion, 500 mL, Intravenous, Continuous, Nandigam, Kavitha V, MD  Constitutional: positive for anorexia and fatigue, negative for chills, malaise, night sweats, sweats and weight loss Eyes: negative Ears, nose, mouth, throat, and face: positive for nasal congestion and scratchy throat, postnasal drip, negative for ear drainage, earaches, hoarseness and sore throat Respiratory: positive for cough and sputum, negative for asthma, chronic bronchitis, dyspnea on exertion, pleurisy/chest pain, stridor and wheezing Cardiovascular: negative Gastrointestinal: positive for decreased appetite, negative for abdominal pain, diarrhea, nausea and vomiting Neurological: positive for headaches, negative for coordination problems, dizziness, gait problems, paresthesia, vertigo and weakness Allergic/Immunologic: positive for hay fever Objective:  BP 140/60   Pulse 71    Temp 98.4 F (36.9 C)   Resp 16   Wt 268 lb (121.6 kg)   SpO2 98%   BMI 47.47 kg/m  Physical Exam Vitals signs reviewed.  Constitutional:      General: She is not in acute distress. HENT:     Head: Normocephalic.     Right Ear: Ear canal and external ear normal. A middle ear effusion is present.     Left Ear: Tympanic membrane, ear canal and external ear normal.     Nose: Mucosal edema, congestion (moderate) and rhinorrhea (clear nasal drainage) present.     Right Turbinates: Enlarged and swollen.     Left Turbinates: Enlarged and swollen.     Right Sinus: Maxillary sinus tenderness and frontal sinus tenderness present.     Left Sinus: Maxillary sinus tenderness and frontal sinus tenderness present.     Mouth/Throat:     Lips: Pink.     Mouth: Mucous membranes are moist.     Pharynx: Uvula midline. Posterior oropharyngeal erythema present. No  pharyngeal swelling, oropharyngeal exudate or uvula swelling.     Tonsils: No tonsillar exudate. Swelling: 0 on the right. 0 on the left.  Eyes:     Conjunctiva/sclera: Conjunctivae normal.     Pupils: Pupils are equal, round, and reactive to light.  Neck:     Musculoskeletal: Normal range of motion and neck supple.  Cardiovascular:     Rate and Rhythm: Normal rate and regular rhythm.     Pulses: Normal pulses.     Heart sounds: Normal heart sounds.  Pulmonary:     Effort: Pulmonary effort is normal. No respiratory distress.     Breath sounds: Normal breath sounds. No stridor. No wheezing, rhonchi or rales.  Abdominal:     General: Bowel sounds are normal.     Palpations: Abdomen is soft.     Tenderness: There is no abdominal tenderness.  Lymphadenopathy:     Cervical: No cervical adenopathy.  Skin:    General: Skin is warm and dry.     Capillary Refill: Capillary refill takes less than 2 seconds.  Neurological:     General: No focal deficit present.     Mental Status: She is alert and oriented to person, place, and time.      Cranial Nerves: No cranial nerve deficit.  Psychiatric:        Mood and Affect: Mood normal.        Thought Content: Thought content normal.      Assessment:  Acute Viral Sinusitis    Plan:   Exam findings, diagnosis etiology and medication use and indications reviewed with patient. Follow- Up and discharge instructions provided. No emergent/urgent issues found on exam.  Based on the patient's clinical presentation, symptoms, duration of symptoms physical assessment, patient's findings are congruent with that of viral etiology.  Patient has not displayed fever, purulent nasal drainage, or double sickening. I had a long discussion with the patient as to why antibiotics are not recommended at this time and recurrence of her sinus symptoms. Patient is currently completing a course of antibioticts for a urinary tract infection.  Informed patient that if she continues to have sinus problems and her symptoms are reoccurring, she may have to consider ENT in the future.  I have informed the patient that I am going to begin symptomatic treatment with her.  I am starting the patient on Singulair, Flonase, and Promethazine-DM.   Instructed the patient to follow-up with our office if she develops a fever greater than 102 or a sudden worsening of symptoms or if her symptoms do not improve within 10-14 days. At that time informed patient that an antibiotic may be appropriate.  The patient is well-appearing, is in no acute distress.  The patient is hypertensive, but informs she doesn't take her BP meds until bedtime.  Patient education was provided. Patient verbalized understanding of information provided and agrees with plan of care (POC), all questions answered. The patient is advised to call or return to clinic if conditiondoes not see an improvement in symptoms, or to seek the care of the closest emergency department if conditionworsens with the above plan.  1. Viral sinusitis  - fluticasone (FLONASE) 50  MCG/ACT nasal spray; Place 2 sprays into both nostrils daily for 10 days.  Dispense: 16 g; Refill: 0 - montelukast (SINGULAIR) 10 MG tablet; Take 1 tablet (10 mg total) by mouth at bedtime for 30 days.  Dispense: 30 tablet; Refill: 0 - promethazine-dextromethorphan (PROMETHAZINE-DM) 6.25-15 MG/5ML syrup; Take 5 mLs by mouth  4 (four) times daily as needed for up to 7 days.  Dispense: 140 mL; Refill: 0 -Take medication as prescribed. -Ibuprofen or Tylenol for pain, fever, or general discomfort. -Increase fluids. -Get plenty of rest. -Sleep elevated on at least 2 pillows at bedtime to help with cough. -Use a humidifier or vaporizer when at home and during sleep. -May use a teaspoon of honey or over-the-counter cough drops to help with cough. -May use normal saline nasal spray or a neti pot to help with nasal congestion throughout the day. -Follow-up if symptoms do not improve within the next 10-14 days or sooner if symptoms worsen.  Will need consideration for ENT if recurrence.

## 2018-10-31 NOTE — Patient Instructions (Signed)
Sinusitis, Adult -Take medication as prescribed. -Ibuprofen or Tylenol for pain, fever, or general discomfort. -Increase fluids. -Get plenty of rest. -Sleep elevated on at least 2 pillows at bedtime to help with cough. -Use a humidifier or vaporizer when at home and during sleep. -May use a teaspoon of honey or over-the-counter cough drops to help with cough. -May use normal saline nasal spray or a neti pot to help with nasal congestion throughout the day. -Follow-up if symptoms do not improve within the next 10-14 days or sooner if symptoms worsen.   Sinusitis is inflammation of your sinuses. Sinuses are hollow spaces in the bones around your face. Your sinuses are located:  Around your eyes.  In the middle of your forehead.  Behind your nose.  In your cheekbones. Mucus normally drains out of your sinuses. When your nasal tissues become inflamed or swollen, mucus can become trapped or blocked. This allows bacteria, viruses, and fungi to grow, which leads to infection. Most infections of the sinuses are caused by a virus. Sinusitis can develop quickly. It can last for up to 4 weeks (acute) or for more than 12 weeks (chronic). Sinusitis often develops after a cold. What are the causes? This condition is caused by anything that creates swelling in the sinuses or stops mucus from draining. This includes:  Allergies.  Asthma.  Infection from bacteria or viruses.  Deformities or blockages in your nose or sinuses.  Abnormal growths in the nose (nasal polyps).  Pollutants, such as chemicals or irritants in the air.  Infection from fungi (rare). What increases the risk? You are more likely to develop this condition if you:  Have a weak body defense system (immune system).  Do a lot of swimming or diving.  Overuse nasal sprays.  Smoke. What are the signs or symptoms? The main symptoms of this condition are pain and a feeling of pressure around the affected sinuses. Other  symptoms include:  Stuffy nose or congestion.  Thick drainage from your nose.  Swelling and warmth over the affected sinuses.  Headache.  Upper toothache.  A cough that may get worse at night.  Extra mucus that collects in the throat or the back of the nose (postnasal drip).  Decreased sense of smell and taste.  Fatigue.  A fever.  Sore throat.  Bad breath. How is this diagnosed? This condition is diagnosed based on:  Your symptoms.  Your medical history.  A physical exam.  Tests to find out if your condition is acute or chronic. This may include: ? Checking your nose for nasal polyps. ? Viewing your sinuses using a device that has a light (endoscope). ? Testing for allergies or bacteria. ? Imaging tests, such as an MRI or CT scan. In rare cases, a bone biopsy may be done to rule out more serious types of fungal sinus disease. How is this treated? Treatment for sinusitis depends on the cause and whether your condition is chronic or acute.  If caused by a virus, your symptoms should go away on their own within 10 days. You may be given medicines to relieve symptoms. They include: ? Medicines that shrink swollen nasal passages (topical intranasal decongestants). ? Medicines that treat allergies (antihistamines). ? A spray that eases inflammation of the nostrils (topical intranasal corticosteroids). ? Rinses that help get rid of thick mucus in your nose (nasal saline washes).  If caused by bacteria, your health care provider may recommend waiting to see if your symptoms improve. Most bacterial infections will  get better without antibiotic medicine. You may be given antibiotics if you have: ? A severe infection. ? A weak immune system.  If caused by narrow nasal passages or nasal polyps, you may need to have surgery. Follow these instructions at home: Medicines  Take, use, or apply over-the-counter and prescription medicines only as told by your health care  provider. These may include nasal sprays.  If you were prescribed an antibiotic medicine, take it as told by your health care provider. Do not stop taking the antibiotic even if you start to feel better. Hydrate and humidify   Drink enough fluid to keep your urine pale yellow. Staying hydrated will help to thin your mucus.  Use a cool mist humidifier to keep the humidity level in your home above 50%.  Inhale steam for 10-15 minutes, 3-4 times a day, or as told by your health care provider. You can do this in the bathroom while a hot shower is running.  Limit your exposure to cool or dry air. Rest  Rest as much as possible.  Sleep with your head raised (elevated).  Make sure you get enough sleep each night. General instructions   Apply a warm, moist washcloth to your face 3-4 times a day or as told by your health care provider. This will help with discomfort.  Wash your hands often with soap and water to reduce your exposure to germs. If soap and water are not available, use hand sanitizer.  Do not smoke. Avoid being around people who are smoking (secondhand smoke).  Keep all follow-up visits as told by your health care provider. This is important. Contact a health care provider if:  You have a fever.  Your symptoms get worse.  Your symptoms do not improve within 10 days. Get help right away if:  You have a severe headache.  You have persistent vomiting.  You have severe pain or swelling around your face or eyes.  You have vision problems.  You develop confusion.  Your neck is stiff.  You have trouble breathing. Summary  Sinusitis is soreness and inflammation of your sinuses. Sinuses are hollow spaces in the bones around your face.  This condition is caused by nasal tissues that become inflamed or swollen. The swelling traps or blocks the flow of mucus. This allows bacteria, viruses, and fungi to grow, which leads to infection.  If you were prescribed an  antibiotic medicine, take it as told by your health care provider. Do not stop taking the antibiotic even if you start to feel better.  Keep all follow-up visits as told by your health care provider. This is important. This information is not intended to replace advice given to you by your health care provider. Make sure you discuss any questions you have with your health care provider. Document Released: 08/06/2005 Document Revised: 01/06/2018 Document Reviewed: 01/06/2018 [employer]vier Interactive Patient Education  2019 [employer].

## 2018-11-01 MED FILL — FLUTICASONE PROP 50 MCG SPR: 50 | 30 days supply | Qty: 16 | Fill #0

## 2018-11-01 MED FILL — PROMETHAZINE W/DM SYRUP: 6.25-15 | 7 days supply | Qty: 140 | Fill #0

## 2018-11-01 MED FILL — MONTELUKAST SOD 10 MG TAB: 10 | 30 days supply | Qty: 30 | Fill #0

## 2018-11-03 ENCOUNTER — Telehealth: Payer: Self-pay

## 2018-11-03 NOTE — Telephone Encounter (Signed)
I was no able to contact the patient in the number provided

## 2018-11-04 ENCOUNTER — Other Ambulatory Visit: Payer: Self-pay

## 2018-11-04 ENCOUNTER — Ambulatory Visit (INDEPENDENT_AMBULATORY_CARE_PROVIDER_SITE_OTHER): Payer: 59 | Admitting: Family Medicine

## 2018-11-04 ENCOUNTER — Encounter (INDEPENDENT_AMBULATORY_CARE_PROVIDER_SITE_OTHER): Payer: Self-pay | Admitting: Family Medicine

## 2018-11-04 VITALS — BP 137/82 | HR 66 | Temp 97.9°F | Ht 63.0 in | Wt 267.0 lb

## 2018-11-04 DIAGNOSIS — Z9189 Other specified personal risk factors, not elsewhere classified: Secondary | ICD-10-CM | POA: Diagnosis not present

## 2018-11-04 DIAGNOSIS — E559 Vitamin D deficiency, unspecified: Secondary | ICD-10-CM

## 2018-11-04 DIAGNOSIS — E119 Type 2 diabetes mellitus without complications: Secondary | ICD-10-CM | POA: Diagnosis not present

## 2018-11-04 DIAGNOSIS — Z6841 Body Mass Index (BMI) 40.0 and over, adult: Secondary | ICD-10-CM

## 2018-11-05 ENCOUNTER — Encounter (INDEPENDENT_AMBULATORY_CARE_PROVIDER_SITE_OTHER): Payer: Self-pay

## 2018-11-05 DIAGNOSIS — G4733 Obstructive sleep apnea (adult) (pediatric): Secondary | ICD-10-CM | POA: Diagnosis not present

## 2018-11-05 MED ORDER — METFORMIN HCL 500 MG PO TABS: 500.0000 mg | ORAL_TABLET | Freq: Two times a day (BID) | ORAL | 0 refills | Status: DC

## 2018-11-05 MED FILL — metFORMIN HCL 500 MG TABS: 500 | 30 days supply | Qty: 60 | Fill #0

## 2018-11-05 NOTE — Progress Notes (Signed)
Office: [phone]  /  Fax: [phone]   HPI:   Chief Complaint: OBESITY Andrea Montes is here to discuss her progress with her obesity treatment plan. She is on the Category 3 plan and is following her eating plan approximately 75 % of the time. She states she is exercising 0 minutes 0 times per week. Andrea Montes had a difficult few weeks secondary to her brother trying to commit suicide. She has had increase in stress eating, nothing specific but often fried food and cookies. She has food in the house, and she wants to get back on track.  Her weight is 267 lb (121.1 kg) today and has gained 3 pounds since her last visit. She has lost 3 lbs since starting treatment with [name].  Vitamin D Deficiency Andrea Montes has a diagnosis of vitamin D deficiency. She is currently taking prescription Vit D. She notes fatigue and denies nausea, vomiting or muscle weakness.  At risk for osteopenia and osteoporosis Andrea Montes is at higher risk of osteopenia and osteoporosis due to vitamin D deficiency.   Diabetes II Andrea Montes has a diagnosis of diabetes type II. Andrea Montes notes carbohydrate cravings and denies GI upset with metformin. Last A1c was 6.4. She denies hypoglycemia. She has been working on intensive lifestyle modifications including diet, exercise, and weight loss to help control her blood glucose levels.  ASSESSMENT AND PLAN:  Vitamin D deficiency  At risk for osteoporosis  Type 2 diabetes mellitus without complication, without long-term current use of insulin (Andrea Montes) - Plan: metFORMIN (GLUCOPHAGE) 500 MG tablet  Class 3 severe obesity with serious comorbidity and body mass index (BMI) of 45.0 to 49.9 in adult, unspecified obesity type ([place])  PLAN:  Vitamin D Deficiency Andrea Montes was informed that low vitamin D levels contributes to fatigue and are associated with obesity, breast, and colon cancer. Andrea Montes agrees to continue taking prescription Vit D @50 ,000 IU every week, no refill needed. She will follow up for  routine testing of vitamin D, at least 2-3 times per year. She was informed of the risk of over-replacement of vitamin D and agrees to not increase her dose unless she discusses this with [name] first. Andrea Montes agrees to follow up with our clinic in 2 weeks.  At risk for osteopenia and osteoporosis Andrea Montes was given extended (15 minutes) osteoporosis prevention counseling today. Andrea Montes is at risk for osteopenia and osteoporsis due to her vitamin D deficiency. She was encouraged to take her vitamin D and follow her higher calcium diet and increase strengthening exercise to help strengthen her bones and decrease her risk of osteopenia and osteoporosis.  Diabetes II Andrea Montes has been given extensive diabetes education by myself today including ideal fasting and post-prandial blood glucose readings, individual ideal Hgb A1c goals and hypoglycemia prevention. We discussed the importance of good blood sugar control to decrease the likelihood of diabetic complications such as nephropathy, neuropathy, limb loss, blindness, coronary artery disease, and death. We discussed the importance of intensive lifestyle modification including diet, exercise and weight loss as the first line treatment for diabetes. Andrea Montes agrees to increase metformin to 500 mg PO BID #60 with no refills. Andrea Montes agrees to follow up with our clinic in 2 weeks.  Obesity Andrea Montes is currently in the action stage of change. As such, her goal is to continue with weight loss efforts She has agreed to follow the Category 3 plan or follow the Pescatarian eating plan + 300 calories Andrea Montes has been instructed to work up to a goal of 150 minutes of combined  cardio and strengthening exercise per week for weight loss and overall health benefits. We discussed the following Behavioral Modification Strategies today: increasing lean protein intake, increasing vegetables and work on meal planning and easy cooking plans, and planning for success   Andrea Montes has agreed  to follow up with our clinic in 2 weeks. She was informed of the importance of frequent follow up visits to maximize her success with intensive lifestyle modifications for her multiple health conditions.  ALLERGIES: Allergies  Allergen Reactions   Kiwi Extract Itching and Other (See Comments)    Itching in mouth with KIWI, And pineapple juice- topically irritating     MEDICATIONS: Current Outpatient Medications on File Prior to Visit  Medication Sig Dispense Refill   albuterol (PROVENTIL HFA;VENTOLIN HFA) 108 (90 Base) MCG/ACT inhaler Inhale 2 puffs into the lungs every 6 (six) hours as needed for up to 10 days for wheezing or shortness of breath. 1 Inhaler 0   Aspirin-Acetaminophen-Caffeine (GOODY HEADACHE PO) Take 1 Package by mouth as needed (pain).     atorvastatin (LIPITOR) 10 MG tablet Take 1 tablet (10 mg total) by mouth at bedtime. 30 tablet 0   bacitracin 500 UNIT/GM ointment Apply 1 application topically 2 (two) times daily. (Patient not taking: Reported on 10/31/2018) 15 g 0   calcium carbonate (TUMS EX) 750 MG chewable tablet Chew 1-2 tablets by mouth 2 (two) times daily as needed for heartburn.     fluticasone (FLONASE) 50 MCG/ACT nasal spray Place 2 sprays into both nostrils daily for 10 days. 16 g 0   furosemide (LASIX) 20 MG tablet Take 1 tablet (20 mg total) by mouth daily. (Patient taking differently: Take 20 mg by mouth daily as needed for fluid. ) 30 tablet 11   meloxicam (MOBIC) 15 MG tablet Take 1 tablet (15 mg total) by mouth daily as needed for pain. (Patient taking differently: Take 15 mg by mouth daily. ) 60 tablet 1   metFORMIN (GLUCOPHAGE) 500 MG tablet Take 1 tablet (500 mg total) by mouth daily with breakfast. 90 tablet 0   montelukast (SINGULAIR) 10 MG tablet Take 1 tablet (10 mg total) by mouth at bedtime. 30 tablet 0   montelukast (SINGULAIR) 10 MG tablet Take 1 tablet (10 mg total) by mouth at bedtime for 30 days. 30 tablet 0    oxyCODONE-acetaminophen (PERCOCET/ROXICET) 5-325 MG tablet Take  1 tab  po q 6 hours prn pain (Patient not taking: Reported on 10/26/2018) 30 tablet 0   phenazopyridine (PYRIDIUM) 100 MG tablet Take 1 tablet (100 mg total) by mouth 3 (three) times daily as needed for pain. 10 tablet 0   promethazine-dextromethorphan (PROMETHAZINE-DM) 6.25-15 MG/5ML syrup Take 5 mLs by mouth 4 (four) times daily as needed for up to 7 days. 140 mL 0   SUMAtriptan (IMITREX) 25 MG tablet Take 25 mg by mouth every 2 (two) hours as needed for migraine or headache.   1   traMADol (ULTRAM) 50 MG tablet Take 1 tablet (50 mg total) by mouth every 6 (six) hours as needed. (Patient not taking: Reported on 10/31/2018) 60 tablet 2   Vitamin D, Ergocalciferol, (DRISDOL) 1.25 MG (50000 UT) CAPS capsule Take 1 capsule (50,000 Units total) by mouth every 7 (seven) days. 4 capsule 0   Current Facility-Administered Medications on File Prior to Visit  Medication Dose Route Frequency Provider Last Rate Last Dose   0.9 %  sodium chloride infusion  500 mL Intravenous Continuous Nandigam, Venia Minks, MD  PAST MEDICAL HISTORY: Past Medical History:  Diagnosis Date   Anemia    no current med.   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   GERD (gastroesophageal reflux disease)    Heart murmur    last  echo- 2010   History of blood transfusion    History of shingles    Hypertension    borderline not on medicaiton at this time    Impingement syndrome of left shoulder region 10/2014   Joint pain    Migraines    Obesity    Pre-diabetes    Rheumatoid arteritis (HCC)    Seasonal allergies    Sleep apnea    uses CPAP nightly   Trigger finger, left middle finger    Vitamin D deficiency    Wears partial dentures    upper    PAST SURGICAL HISTORY: Past Surgical History:  Procedure Laterality Date   CARPAL TUNNEL RELEASE Left 04/25/2006   CARPAL TUNNEL RELEASE Right 07/15/2018   Procedure: RIGHT CARPAL TUNNEL RELEASE;  Surgeon: Meredith Pel, MD;  Location: [place];  Service: Orthopedics;  Laterality: Right;   CLOSED MANIPULATION SHOULDER WITH STERIOD INJECTION Left 02/03/2015   Procedure: LEFT  MANIPULATION SHOULDER UNDER ANESTHESIA WITH INJECTION OF STEROID;  Surgeon: Kathryne Hitch, MD;  Location: [place];  Service: Orthopedics;  Laterality: Left;   COLONOSCOPY     COLONOSCOPY WITH PROPOFOL  10/09/2011   POLYPECTOMY     RESECTION DISTAL CLAVICAL Left 11/04/2014   Procedure: RESECTION DISTAL CLAVICAL;  Surgeon: Kathryne Hitch, MD;  Location: [place];  Service: Orthopedics;  Laterality: Left;   ROTATOR CUFF REPAIR  2010   SHOULDER ARTHROSCOPY WITH ROTATOR CUFF REPAIR Right 10/04/2008   SHOULDER ARTHROSCOPY WITH SUBACROMIAL DECOMPRESSION, ROTATOR CUFF REPAIR AND BICEP TENDON REPAIR Left 11/04/2014   Procedure: LEFT SHOULDER ARTHROSCOPY WITH DEBRIDEMENT, DISTAL CLAVICLE EXCISION, ACROMIOPLASTY, ROTATOR CUFF REPAIR AND BICEP TENODESIS;  Surgeon: Kathryne Hitch, MD;  Location: [place];  Service: Orthopedics;  Laterality: Left;   TRIGGER FINGER RELEASE Right 02/03/2015   Procedure: RIGHT LONG FINGER TRIGGER RELEASE;  Surgeon: Kathryne Hitch, MD;  Location: [place];  Service: Orthopedics;  Laterality: Right;   TRIGGER FINGER RELEASE Left 10/10/2017   Procedure: LEFT HAND RELEASE TRIGGER FINGER 3RD LONG FINGER AND CYST EXCISION;  Surgeon:  Meredith Pel, MD;  Location: [place];  Service: Orthopedics;  Laterality: Left;   TUBAL LIGATION     VAGINAL HYSTERECTOMY  2001   partial    SOCIAL HISTORY: Social History   Tobacco Use   Smoking status: Never Smoker   Smokeless tobacco: Never Used  Substance Use Topics   Alcohol use: No   Drug use: No    FAMILY HISTORY: Family History  Problem Relation Age of Onset   Kidney disease Brother    Hypertension Brother    Leukemia Mother    Heart  disease Mother    Stroke Mother    Cancer Mother    Depression Mother    Anxiety disorder Mother    AAA (abdominal aortic aneurysm) Father    Esophageal cancer Maternal Uncle    Hypertension Brother    Colon cancer Neg Hx    Colon polyps Neg Hx    Rectal cancer Neg Hx    Stomach cancer Neg Hx     ROS: Review of Systems  Constitutional: Positive for malaise/fatigue. Negative for weight loss.  Gastrointestinal: Negative for nausea and vomiting.  Musculoskeletal:       Negative muscle weakness  Endo/Heme/Allergies:       Negative hypoglycemia    PHYSICAL EXAM: Blood pressure 137/82, pulse 66, temperature 97.9 F (36.6 C), temperature source Oral, height 5\' 3"  (1.6 m), weight 267 lb (121.1 kg), SpO2 98 %. Body mass index is 47.3 kg/m. Physical Exam Vitals signs reviewed.  Constitutional:      Appearance: Normal appearance. She is obese.  Cardiovascular:     Rate and Rhythm: Normal rate.     Pulses: Normal pulses.  Pulmonary:     Effort: Pulmonary effort is normal.     Breath sounds: Normal breath sounds.  Musculoskeletal: Normal range of motion.  Skin:    General: Skin is warm and dry.  Neurological:     Mental Status: She is alert and oriented to person, place, and time.  Psychiatric:        Mood and Affect: Mood normal.        Behavior: Behavior normal.     RECENT LABS AND TESTS: BMET    Component Value Date/Time   NA 141 10/16/2018 0948   K 4.5 10/16/2018 0948   CL 102  10/16/2018 0948   CO2 25 10/16/2018 0948   GLUCOSE 102 (H) 10/16/2018 0948   GLUCOSE 124 (H) 07/09/2018 0925   BUN 15 10/16/2018 0948   CREATININE 0.67 10/16/2018 0948   CREATININE 0.85 03/07/2016 1654   CALCIUM 9.1 10/16/2018 0948   GFRNONAA 95 10/16/2018 0948   GFRAA 110 10/16/2018 0948   Lab Results  Component Value Date   HGBA1C 6.4 (H) 10/16/2018   HGBA1C 6.3 (H) 07/09/2018   HGBA1C 6.7 (H) 05/07/2018   Lab Results  Component Value Date   INSULIN 44.1 (H) 10/16/2018   INSULIN 39.8 (H) 05/07/2018   CBC    Component Value Date/Time   WBC 7.9 10/16/2018 0948   WBC 7.5 07/09/2018 0925   RBC 5.81 (H) 10/16/2018 0948   RBC 6.24 (H) 07/09/2018 0925   HGB 12.5 10/16/2018 0948   HCT 40.5 10/16/2018 0948   PLT 270 07/09/2018 0925   MCV 70 (L) 10/16/2018 0948   MCH 21.5 (L) 10/16/2018 0948   MCH 21.0 (L) 07/09/2018 0925   MCHC 30.9 (L) 10/16/2018 0948   MCHC 29.2 (L) 07/09/2018 0925   RDW 17.7 (H) 10/16/2018 0948   LYMPHSABS 2.6 10/16/2018 0948   MONOABS 0.5 07/14/2017 2115   EOSABS 0.2 10/16/2018 0948   BASOSABS 0.0 10/16/2018 0948   Iron/TIBC/Ferritin/ %Sat No results found for: IRON, TIBC, FERRITIN, IRONPCTSAT Lipid Panel     Component Value Date/Time   CHOL 152 10/16/2018 0948   TRIG 75 10/16/2018 0948   HDL 37 (L) 10/16/2018 0948   CHOLHDL 4.1 Ratio 09/26/2009 1823   VLDL 26 09/26/2009 1823   LDLCALC 100 (H) 10/16/2018 0948   Hepatic Function Panel     Component Value Date/Time   PROT 7.1 10/16/2018  0948   ALBUMIN 3.9 10/16/2018 0948   AST 12 10/16/2018 0948   ALT 7 10/16/2018 0948   ALKPHOS 92 10/16/2018 0948   BILITOT 0.3 10/16/2018 0948      Component Value Date/Time   TSH 1.470 05/07/2018 1424   TSH 1.55 11/25/2008 1700      OBESITY BEHAVIORAL INTERVENTION VISIT  Today's visit was # 11   Starting weight: 270 lbs Starting date: 05/07/18 Today's weight : 267 lbs  Today's date: 11/04/2018 Total lbs lost to date: 3     11/04/2018  Height  5\' 3"  (1.6 m)  Weight 267 lb (121.1 kg)  BMI (Calculated) 47.31  BLOOD PRESSURE - SYSTOLIC 025  BLOOD PRESSURE - DIASTOLIC 82   Body Fat % 85.2 %  Total Body Water (lbs) 99.8 lbs     ASK: We discussed the diagnosis of obesity with [name] today and Macklyn agreed to give [name] permission to discuss obesity behavioral modification therapy today.  ASSESS: Rutha has the diagnosis of obesity and her BMI today is 47.31 Evania is in the action stage of change   ADVISE: Vieva was educated on the multiple health risks of obesity as well as the benefit of weight loss to improve her health. She was advised of the need for long term treatment and the importance of lifestyle modifications to improve her current health and to decrease her risk of future health problems.  AGREE: Multiple dietary modification options and treatment options were discussed and  Anwita agreed to follow the recommendations documented in the above note.  ARRANGE: Roseanna was educated on the importance of frequent visits to treat obesity as outlined per CMS and USPSTF guidelines and agreed to schedule her next follow up appointment today.  I, Laycee Fitzsimmons, am acting as transcriptionist for Ilene Qua, MD  I have reviewed the above documentation for accuracy and completeness, and I agree with the above. - Ilene Qua, MD

## 2018-11-11 ENCOUNTER — Encounter (INDEPENDENT_AMBULATORY_CARE_PROVIDER_SITE_OTHER): Payer: Self-pay

## 2018-11-18 ENCOUNTER — Ambulatory Visit: Payer: 59 | Admitting: Orthotics

## 2018-11-18 ENCOUNTER — Other Ambulatory Visit: Payer: Self-pay

## 2018-11-18 VITALS — Temp 97.4°F

## 2018-11-18 DIAGNOSIS — M722 Plantar fascial fibromatosis: Secondary | ICD-10-CM

## 2018-11-18 DIAGNOSIS — M2170 Unequal limb length (acquired), unspecified site: Secondary | ICD-10-CM

## 2018-11-18 NOTE — Progress Notes (Signed)
Patient came in today to pick up custom made foot orthotics.  The goals were accomplished and the patient reported no dissatisfaction with said orthotics.  Patient was advised of breakin period and how to report any issues. 

## 2018-11-20 ENCOUNTER — Encounter (INDEPENDENT_AMBULATORY_CARE_PROVIDER_SITE_OTHER): Payer: Self-pay | Admitting: Family Medicine

## 2018-11-20 ENCOUNTER — Other Ambulatory Visit: Payer: Self-pay

## 2018-11-20 ENCOUNTER — Ambulatory Visit (INDEPENDENT_AMBULATORY_CARE_PROVIDER_SITE_OTHER): Payer: 59 | Admitting: Family Medicine

## 2018-11-20 DIAGNOSIS — E119 Type 2 diabetes mellitus without complications: Secondary | ICD-10-CM

## 2018-11-20 DIAGNOSIS — Z6837 Body mass index (BMI) 37.0-37.9, adult: Secondary | ICD-10-CM | POA: Diagnosis not present

## 2018-11-20 DIAGNOSIS — E559 Vitamin D deficiency, unspecified: Secondary | ICD-10-CM | POA: Diagnosis not present

## 2018-11-20 MED ORDER — METFORMIN HCL 500 MG PO TABS: 500.0000 mg | ORAL_TABLET | Freq: Two times a day (BID) | ORAL | 0 refills | Status: DC

## 2018-11-24 NOTE — Progress Notes (Signed)
Office: [phone]  /  Fax: [phone] TeleHealth Visit:  Andrea Montes has verbally consented to this TeleHealth visit today. The patient is located at home, the provider is located at the [facility] office. The participants in this visit include the listed provider and patient and provider's assistant. The visit was conducted today via face time.  HPI:   Chief Complaint: OBESITY Andrea Montes is here to discuss her progress with her obesity treatment plan. She is on the Category 3 plan or follow the Pescatarian eating plan + 300 calories and is following her eating plan approximately 70 % of the time. She states she is exercising 0 minutes 0 times per week. Davanna is working from home now, and has been for the last 2 weeks. She is reading more and watching Youtube to learn to do things. She feels staying on the meal plan to be more difficult. She notes it has been hard to find bread. She states her blood pressure was 131/79 while giving blood. We were unable to weigh the patient today for this TeleHealth visit. She feels as if she has maintained her weight since her last visit. She has lost 3 lbs since starting treatment with [name].  Diabetes II with Hyperglycemia Andrea Montes has a diagnosis of diabetes type II. Arbadella denies GI side effects of metformin, and notes carbohydrate cravings secondary to stress eating. She denies hypoglycemia. Last A1c was 6.4. She has been working on intensive lifestyle modifications including diet, exercise, and weight loss to help control her blood glucose levels.  Vitamin D Deficiency Andrea Montes has a diagnosis of vitamin D deficiency. She is currently taking prescription Vit D. She notes fatigue and denies nausea, vomiting or muscle weakness.  ASSESSMENT AND PLAN:  Type 2 diabetes mellitus without complication, without long-term current use of insulin ([place]) - Plan: metFORMIN (GLUCOPHAGE) 500 MG tablet  Vitamin D deficiency  Class 2 severe obesity  with serious comorbidity and body mass index (BMI) of 37.0 to 37.9 in adult, unspecified obesity type ([place])  PLAN:  Diabetes II with Hyperglycemia Andrea Montes has been given extensive diabetes education by myself today including ideal fasting and post-prandial blood glucose readings, individual ideal Hgb A1c goals and hypoglycemia prevention. We discussed the importance of good blood sugar control to decrease the likelihood of diabetic complications such as nephropathy, neuropathy, limb loss, blindness, coronary artery disease, and death. We discussed the importance of intensive lifestyle modification including diet, exercise and weight loss as the first line treatment for diabetes. [name] agrees to continue taking metformin 500 mg PO BID #60 and we will refill for 1 month. [name] agrees to follow up with our clinic in 2 weeks.  Vitamin D Deficiency Andrea Montes was informed that low vitamin D levels contributes to fatigue and are associated with obesity, breast, and colon cancer. Andrea Montes agrees to continue taking prescription Vit D @50 ,000 IU every week, no refill needed. She will follow up for routine testing of vitamin D, at least 2-3 times per year. She was informed of the risk of over-replacement of vitamin D and agrees to not increase her dose unless she discusses this with [name] first. Andrea Montes agrees to follow up with our clinic in 2 weeks.  Obesity Andrea Montes is currently in the action stage of change. As such, her goal is to continue with weight loss efforts She has agreed to follow the Category 3 plan or follow the Pescatarian eating plan + 300 calories Andrea Montes has been instructed to work up to a goal  of 150 minutes of combined cardio and strengthening exercise per week for weight loss and overall health benefits. We discussed the following Behavioral Modification Strategies today: increasing lean protein intake, increasing vegetables and work on meal planning and easy cooking plans, better snacking choices, and  planning for success   Andrea Montes has agreed to follow up with our clinic in 2 weeks. She was informed of the importance of frequent follow up visits to maximize her success with intensive lifestyle modifications for her multiple health conditions.  ALLERGIES: Allergies  Allergen Reactions  . Kiwi Extract Itching and Other (See Comments)    Itching in mouth with KIWI, And pineapple juice- topically irritating     MEDICATIONS: Current Outpatient Medications on File Prior to Visit  Medication Sig Dispense Refill  . albuterol (PROVENTIL HFA;VENTOLIN HFA) 108 (90 Base) MCG/ACT inhaler Inhale 2 puffs into the lungs every 6 (six) hours as needed for up to 10 days for wheezing or shortness of breath. 1 Inhaler 0  . Aspirin-Acetaminophen-Caffeine (GOODY HEADACHE PO) Take 1 Package by mouth as needed (pain).    [name] atorvastatin (LIPITOR) 10 MG tablet Take 1 tablet (10 mg total) by mouth at bedtime. 30 tablet 0  . bacitracin 500 UNIT/GM ointment Apply 1 application topically 2 (two) times daily. (Patient not taking: Reported on 10/31/2018) 15 g 0  . calcium carbonate (TUMS EX) 750 MG chewable tablet Chew 1-2 tablets by mouth 2 (two) times daily as needed for heartburn.    . fluticasone (FLONASE) 50 MCG/ACT nasal spray Place 2 sprays into both nostrils daily for 10 days. 16 g 0  . furosemide (LASIX) 20 MG tablet Take 1 tablet (20 mg total) by mouth daily. (Patient taking differently: Take 20 mg by mouth daily as needed for fluid. ) 30 tablet 11  . meloxicam (MOBIC) 15 MG tablet Take 1 tablet (15 mg total) by mouth daily as needed for pain. (Patient taking differently: Take 15 mg by mouth daily. ) 60 tablet 1  . montelukast (SINGULAIR) 10 MG tablet Take 1 tablet (10 mg total) by mouth at bedtime. 30 tablet 0  . montelukast (SINGULAIR) 10 MG tablet Take 1 tablet (10 mg total) by mouth at bedtime for 30 days. 30 tablet 0  . oxyCODONE-acetaminophen (PERCOCET/ROXICET) 5-325 MG tablet Take  1 tab  po q 6 hours prn  pain (Patient not taking: Reported on 10/26/2018) 30 tablet 0  . phenazopyridine (PYRIDIUM) 100 MG tablet Take 1 tablet (100 mg total) by mouth 3 (three) times daily as needed for pain. 10 tablet 0  . SUMAtriptan (IMITREX) 25 MG tablet Take 25 mg by mouth every 2 (two) hours as needed for migraine or headache.   1  . traMADol (ULTRAM) 50 MG tablet Take 1 tablet (50 mg total) by mouth every 6 (six) hours as needed. (Patient not taking: Reported on 10/31/2018) 60 tablet 2  . Vitamin D, Ergocalciferol, (DRISDOL) 1.25 MG (50000 UT) CAPS capsule Take 1 capsule (50,000 Units total) by mouth every 7 (seven) days. 4 capsule 0   Current Facility-Administered Medications on File Prior to Visit  Medication Dose Route Frequency Provider Last Rate Last Dose  . 0.9 %  sodium chloride infusion  500 mL Intravenous Continuous Nandigam, Venia Minks, MD        PAST MEDICAL HISTORY: Past Medical History:  Diagnosis Date  . Anemia    no current med.  [name] Apnea   . 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 Back pain   .  Carpal tunnel syndrome   . Complication of anesthesia    itching from last surgery that was completed at the surgery center off of [address] patient was given benedryl, generalized itching and they are not sure where that came from  . DDD (degenerative disc disease), lumbar   . Dental crowns present   . Edema of lower extremity    bilateral  . Frozen shoulder    surgery 11-04-14  . GERD (gastroesophageal reflux disease)    no current med.  [name] GERD (gastroesophageal reflux disease)   . Heart murmur    last  echo- 2010  . History of blood transfusion   . History of shingles   . Hypertension    borderline not on medicaiton at this time  . Impingement syndrome of left shoulder region 10/2014  . Joint pain   . Migraines   . Obesity   . Pre-diabetes   . Rheumatoid arteritis ([place])   . Seasonal allergies   . Sleep apnea    uses CPAP nightly  . Trigger finger, left middle  finger   . Vitamin D deficiency   . Wears partial dentures    upper    PAST SURGICAL HISTORY: Past Surgical History:  Procedure Laterality Date  . CARPAL TUNNEL RELEASE Left 04/25/2006  . CARPAL TUNNEL RELEASE Right 07/15/2018   Procedure: RIGHT CARPAL TUNNEL RELEASE;  Surgeon: Meredith Pel, MD;  Location: [facility];  Service: Orthopedics;  Laterality: Right;  . CLOSED MANIPULATION SHOULDER WITH STERIOD INJECTION Left 02/03/2015   Procedure: LEFT  MANIPULATION SHOULDER UNDER ANESTHESIA WITH INJECTION OF STEROID;  Surgeon: Kathryne Hitch, MD;  Location: [place];  Service: Orthopedics;  Laterality: Left;  . COLONOSCOPY    . COLONOSCOPY WITH PROPOFOL  10/09/2011  . POLYPECTOMY    . RESECTION DISTAL CLAVICAL Left 11/04/2014   Procedure: RESECTION DISTAL CLAVICAL;  Surgeon: Kathryne Hitch, MD;  Location: [place];  Service: Orthopedics;  Laterality: Left;  . ROTATOR CUFF REPAIR  2010  . SHOULDER ARTHROSCOPY WITH ROTATOR CUFF REPAIR Right 10/04/2008  . SHOULDER ARTHROSCOPY WITH SUBACROMIAL DECOMPRESSION, ROTATOR CUFF REPAIR AND BICEP TENDON REPAIR Left 11/04/2014   Procedure: LEFT SHOULDER ARTHROSCOPY WITH DEBRIDEMENT, DISTAL CLAVICLE EXCISION, ACROMIOPLASTY, ROTATOR CUFF REPAIR AND BICEP TENODESIS;  Surgeon: Kathryne Hitch, MD;  Location: [facility];  Service: Orthopedics;  Laterality: Left;  . TRIGGER FINGER RELEASE Right 02/03/2015   Procedure: RIGHT LONG FINGER TRIGGER RELEASE;  Surgeon: Kathryne Hitch, MD;  Location: [place];  Service: Orthopedics;  Laterality: Right;  . TRIGGER FINGER RELEASE Left 10/10/2017   Procedure: LEFT HAND RELEASE TRIGGER FINGER 3RD LONG FINGER AND CYST EXCISION;  Surgeon: Meredith Pel, MD;  Location: [place];  Service: Orthopedics;  Laterality: Left;  . TUBAL LIGATION    . VAGINAL HYSTERECTOMY  2001   partial    SOCIAL HISTORY: Social History   Tobacco Use  . Smoking status: Never Smoker  .  Smokeless tobacco: Never Used  Substance Use Topics  . Alcohol use: No  . Drug use: No    FAMILY HISTORY: Family History  Problem Relation Age of Onset  . Kidney disease Brother   . Hypertension Brother   . Leukemia Mother   . Heart disease Mother   . Stroke Mother   . Cancer Mother   . Depression Mother   . Anxiety disorder Mother   . AAA (abdominal aortic aneurysm) Father   . Esophageal cancer Maternal Uncle   .  Hypertension Brother   . Colon cancer Neg Hx   . Colon polyps Neg Hx   . Rectal cancer Neg Hx   . Stomach cancer Neg Hx     ROS: Review of Systems  Constitutional: Positive for malaise/fatigue. Negative for weight loss.  Gastrointestinal: Negative for nausea and vomiting.  Musculoskeletal:       Negative muscle weakness  Endo/Heme/Allergies:       Negative hypoglycemia    PHYSICAL EXAM: Pt in no acute distress  RECENT LABS AND TESTS: BMET    Component Value Date/Time   NA 141 10/16/2018 0948   K 4.5 10/16/2018 0948   CL 102 10/16/2018 0948   CO2 25 10/16/2018 0948   GLUCOSE 102 (H) 10/16/2018 0948   GLUCOSE 124 (H) 07/09/2018 0925   BUN 15 10/16/2018 0948   CREATININE 0.67 10/16/2018 0948   CREATININE 0.85 03/07/2016 1654   CALCIUM 9.1 10/16/2018 0948   GFRNONAA 95 10/16/2018 0948   GFRAA 110 10/16/2018 0948   Lab Results  Component Value Date   HGBA1C 6.4 (H) 10/16/2018   HGBA1C 6.3 (H) 07/09/2018   HGBA1C 6.7 (H) 05/07/2018   Lab Results  Component Value Date   INSULIN 44.1 (H) 10/16/2018   INSULIN 39.8 (H) 05/07/2018   CBC    Component Value Date/Time   WBC 7.9 10/16/2018 0948   WBC 7.5 07/09/2018 0925   RBC 5.81 (H) 10/16/2018 0948   RBC 6.24 (H) 07/09/2018 0925   HGB 12.5 10/16/2018 0948   HCT 40.5 10/16/2018 0948   PLT 270 07/09/2018 0925   MCV 70 (L) 10/16/2018 0948   MCH 21.5 (L) 10/16/2018 0948   MCH 21.0 (L) 07/09/2018 0925   MCHC 30.9 (L) 10/16/2018 0948   MCHC 29.2 (L) 07/09/2018 0925   RDW 17.7 (H) 10/16/2018  0948   LYMPHSABS 2.6 10/16/2018 0948   MONOABS 0.5 07/14/2017 2115   EOSABS 0.2 10/16/2018 0948   BASOSABS 0.0 10/16/2018 0948   Iron/TIBC/Ferritin/ %Sat No results found for: IRON, TIBC, FERRITIN, IRONPCTSAT Lipid Panel     Component Value Date/Time   CHOL 152 10/16/2018 0948   TRIG 75 10/16/2018 0948   HDL 37 (L) 10/16/2018 0948   CHOLHDL 4.1 Ratio 09/26/2009 1823   VLDL 26 09/26/2009 1823   LDLCALC 100 (H) 10/16/2018 0948   Hepatic Function Panel     Component Value Date/Time   PROT 7.1 10/16/2018 0948   ALBUMIN 3.9 10/16/2018 0948   AST 12 10/16/2018 0948   ALT 7 10/16/2018 0948   ALKPHOS 92 10/16/2018 0948   BILITOT 0.3 10/16/2018 0948      Component Value Date/Time   TSH 1.470 05/07/2018 1424   TSH 1.55 11/25/2008 1700      I, Trixie Dredge, am acting as transcriptionist for Ilene Qua, MD  I have reviewed the above documentation for accuracy and completeness, and I agree with the above. - Ilene Qua, MD

## 2018-12-04 ENCOUNTER — Other Ambulatory Visit: Payer: Self-pay

## 2018-12-04 ENCOUNTER — Ambulatory Visit (INDEPENDENT_AMBULATORY_CARE_PROVIDER_SITE_OTHER): Payer: 59 | Admitting: Family Medicine

## 2018-12-04 ENCOUNTER — Encounter (INDEPENDENT_AMBULATORY_CARE_PROVIDER_SITE_OTHER): Payer: Self-pay | Admitting: Family Medicine

## 2018-12-04 DIAGNOSIS — E1165 Type 2 diabetes mellitus with hyperglycemia: Secondary | ICD-10-CM | POA: Diagnosis not present

## 2018-12-04 DIAGNOSIS — Z6837 Body mass index (BMI) 37.0-37.9, adult: Secondary | ICD-10-CM | POA: Diagnosis not present

## 2018-12-04 DIAGNOSIS — D508 Other iron deficiency anemias: Secondary | ICD-10-CM | POA: Diagnosis not present

## 2018-12-09 NOTE — Progress Notes (Signed)
Office: [phone]  /  Fax: [phone] TeleHealth Visit:  Andrea Montes has verbally consented to this TeleHealth visit today. The patient is located at home, the provider is located at the [facility] office. The participants in this visit include the listed provider and patient. The visit was conducted today via face time.  HPI:   Chief Complaint: OBESITY Andrea Montes is here to discuss her progress with her obesity treatment plan. She is on the Category 3 plan or follow the Pescatarian eating plan + 300 calories and is following her eating plan approximately 75 % of the time. She states she is walking for 30 minutes 3 times per week. Andrea Montes voices the last 2 weeks have been better than the previous 2 weeks. She is doing more physical activity and is sticking to the plan more stringently. She is making substitutions for food she can't find.  We were unable to weigh the patient today for this TeleHealth visit. She feels as if she has lost 2 lbs since her last visit. She has lost 3-5 lbs since starting treatment with [name].  Iron Deficiency Anemia Andrea Montes has a diagnosis of anemia. Her MCV was of 70 previously. She is on iron supplementation daily and denies GI side effects.  Diabetes II with Hyperglycemia Andrea Montes has a diagnosis of diabetes type II. Andrea Montes states she is not checking BGs. She is on metformin BID and denies feelings of hypoglycemia. Last A1c was 6.4. She has been working on intensive lifestyle modifications including diet, exercise, and weight loss to help control her blood glucose levels.  ASSESSMENT AND PLAN:  Other iron deficiency anemia  Type 2 diabetes mellitus with hyperglycemia, without long-term current use of insulin (HCC)  Class 2 severe obesity with serious comorbidity and body mass index (BMI) of 37.0 to 37.9 in adult, unspecified obesity type (HCC)  PLAN:  Iron Deficiency Anemia The diagnosis of Iron deficiency anemia was discussed with Andrea Montes  and was explained in detail. She was given suggestions of iron rich foods and iron supplement was not prescribed. We will repeat CDC and anemia panel in mid May. Sarabelle agrees to follow up with our clinic in 2 weeks.  Diabetes II with Hyperglycemia Andrea Montes has been given extensive diabetes education by myself today including ideal fasting and post-prandial blood glucose readings, individual ideal Hgb A1c goals and hypoglycemia prevention. We discussed the importance of good blood sugar control to decrease the likelihood of diabetic complications such as nephropathy, neuropathy, limb loss, blindness, coronary artery disease, and death. We discussed the importance of intensive lifestyle modification including diet, exercise and weight loss as the first line treatment for diabetes. Andrea Montes agrees to continue taking metformin, no refill needed. Andrea Montes agrees to follow up with our clinic in 2 weeks.  Obesity Andrea Montes is currently in the action stage of change. As such, her goal is to continue with weight loss efforts She has agreed to follow the Category 3 plan Andrea Montes has been instructed to work up to a goal of 150 minutes of combined cardio and strengthening exercise per week for weight loss and overall health benefits. We discussed the following Behavioral Modification Strategies today: increasing lean protein intake, work on meal planning and easy cooking plans and ways to avoid boredom eating, better snacking choices, and planning for success   Andrea Montes has agreed to follow up with our clinic in 2 weeks. She was informed of the importance of frequent follow up visits to maximize her success with intensive lifestyle modifications  for her multiple health conditions.  ALLERGIES: Allergies  Allergen Reactions  . Kiwi Extract Itching and Other (See Comments)    Itching in mouth with KIWI, And pineapple juice- topically irritating     MEDICATIONS: Current Outpatient Medications on File Prior to Visit   Medication Sig Dispense Refill  . albuterol (PROVENTIL HFA;VENTOLIN HFA) 108 (90 Base) MCG/ACT inhaler Inhale 2 puffs into the lungs every 6 (six) hours as needed for up to 10 days for wheezing or shortness of breath. 1 Inhaler 0  . Aspirin-Acetaminophen-Caffeine (GOODY HEADACHE PO) Take 1 Package by mouth as needed (pain).    [name] atorvastatin (LIPITOR) 10 MG tablet Take 1 tablet (10 mg total) by mouth at bedtime. 30 tablet 0  . bacitracin 500 UNIT/GM ointment Apply 1 application topically 2 (two) times daily. (Patient not taking: Reported on 10/31/2018) 15 g 0  . calcium carbonate (TUMS EX) 750 MG chewable tablet Chew 1-2 tablets by mouth 2 (two) times daily as needed for heartburn.    . fluticasone (FLONASE) 50 MCG/ACT nasal spray Place 2 sprays into both nostrils daily for 10 days. 16 g 0  . furosemide (LASIX) 20 MG tablet Take 1 tablet (20 mg total) by mouth daily. (Patient taking differently: Take 20 mg by mouth daily as needed for fluid. ) 30 tablet 11  . meloxicam (MOBIC) 15 MG tablet Take 1 tablet (15 mg total) by mouth daily as needed for pain. (Patient taking differently: Take 15 mg by mouth daily. ) 60 tablet 1  . metFORMIN (GLUCOPHAGE) 500 MG tablet Take 1 tablet (500 mg total) by mouth 2 (two) times daily with a meal. 60 tablet 0  . montelukast (SINGULAIR) 10 MG tablet Take 1 tablet (10 mg total) by mouth at bedtime. 30 tablet 0  . montelukast (SINGULAIR) 10 MG tablet Take 1 tablet (10 mg total) by mouth at bedtime for 30 days. 30 tablet 0  . oxyCODONE-acetaminophen (PERCOCET/ROXICET) 5-325 MG tablet Take  1 tab  po q 6 hours prn pain (Patient not taking: Reported on 10/26/2018) 30 tablet 0  . phenazopyridine (PYRIDIUM) 100 MG tablet Take 1 tablet (100 mg total) by mouth 3 (three) times daily as needed for pain. 10 tablet 0  . SUMAtriptan (IMITREX) 25 MG tablet Take 25 mg by mouth every 2 (two) hours as needed for migraine or headache.   1  . traMADol (ULTRAM) 50 MG tablet Take 1 tablet (50  mg total) by mouth every 6 (six) hours as needed. (Patient not taking: Reported on 10/31/2018) 60 tablet 2  . Vitamin D, Ergocalciferol, (DRISDOL) 1.25 MG (50000 UT) CAPS capsule Take 1 capsule (50,000 Units total) by mouth every 7 (seven) days. 4 capsule 0   Current Facility-Administered Medications on File Prior to Visit  Medication Dose Route Frequency Provider Last Rate Last Dose  . 0.9 %  sodium chloride infusion  500 mL Intravenous Continuous Nandigam, Venia Minks, MD        PAST MEDICAL HISTORY: Past Medical History:  Diagnosis Date  . Anemia    no current med.  [name] Apnea   . 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 Back pain   . Carpal tunnel syndrome   . Complication of anesthesia    itching from last surgery that was completed at the surgery center off of [address] patient was given benedryl, generalized itching and they are not sure where that came from  . DDD (degenerative disc disease), lumbar   . Dental  crowns present   . Edema of lower extremity    bilateral  . Frozen shoulder    surgery 11-04-14  . GERD (gastroesophageal reflux disease)    no current med.  [name] GERD (gastroesophageal reflux disease)   . Heart murmur    last  echo- 2010  . History of blood transfusion   . History of shingles   . Hypertension    borderline not on medicaiton at this time  . Impingement syndrome of left shoulder region 10/2014  . Joint pain   . Migraines   . Obesity   . Pre-diabetes   . Rheumatoid arteritis ([place])   . Seasonal allergies   . Sleep apnea    uses CPAP nightly  . Trigger finger, left middle finger   . Vitamin D deficiency   . Wears partial dentures    upper    PAST SURGICAL HISTORY: Past Surgical History:  Procedure Laterality Date  . CARPAL TUNNEL RELEASE Left 04/25/2006  . CARPAL TUNNEL RELEASE Right 07/15/2018   Procedure: RIGHT CARPAL TUNNEL RELEASE;  Surgeon: Meredith Pel, MD;  Location: [place] [facility];  Service: Orthopedics;  Laterality:  Right;  . CLOSED MANIPULATION SHOULDER WITH STERIOD INJECTION Left 02/03/2015   Procedure: LEFT  MANIPULATION SHOULDER UNDER ANESTHESIA WITH INJECTION OF STEROID;  Surgeon: Kathryne Hitch, MD;  Location: [place];  Service: Orthopedics;  Laterality: Left;  . COLONOSCOPY    . COLONOSCOPY WITH PROPOFOL  10/09/2011  . POLYPECTOMY    . RESECTION DISTAL CLAVICAL Left 11/04/2014   Procedure: RESECTION DISTAL CLAVICAL;  Surgeon: Kathryne Hitch, MD;  Location: [place] [facility];  Service: Orthopedics;  Laterality: Left;  . ROTATOR CUFF REPAIR  2010  . SHOULDER ARTHROSCOPY WITH ROTATOR CUFF REPAIR Right 10/04/2008  . SHOULDER ARTHROSCOPY WITH SUBACROMIAL DECOMPRESSION, ROTATOR CUFF REPAIR AND BICEP TENDON REPAIR Left 11/04/2014   Procedure: LEFT SHOULDER ARTHROSCOPY WITH DEBRIDEMENT, DISTAL CLAVICLE EXCISION, ACROMIOPLASTY, ROTATOR CUFF REPAIR AND BICEP TENODESIS;  Surgeon: Kathryne Hitch, MD;  Location: [facility];  Service: Orthopedics;  Laterality: Left;  . TRIGGER FINGER RELEASE Right 02/03/2015   Procedure: RIGHT LONG FINGER TRIGGER RELEASE;  Surgeon: Kathryne Hitch, MD;  Location: [place];  Service: Orthopedics;  Laterality: Right;  . TRIGGER FINGER RELEASE Left 10/10/2017   Procedure: LEFT HAND RELEASE TRIGGER FINGER 3RD LONG FINGER AND CYST EXCISION;  Surgeon: Meredith Pel, MD;  Location: [facility];  Service: Orthopedics;  Laterality: Left;  . TUBAL LIGATION    . VAGINAL HYSTERECTOMY  2001   partial    SOCIAL HISTORY: Social History   Tobacco Use  . Smoking status: Never Smoker  . Smokeless tobacco: Never Used  Substance Use Topics  . Alcohol use: No  . Drug use: No    FAMILY HISTORY: Family History  Problem Relation Age of Onset  . Kidney disease Brother   . Hypertension Brother   . Leukemia Mother   . Heart disease Mother   . Stroke Mother   . Cancer Mother   . Depression Mother   . Anxiety disorder Mother   . AAA  (abdominal aortic aneurysm) Father   . Esophageal cancer Maternal Uncle   . Hypertension Brother   . Colon cancer Neg Hx   . Colon polyps Neg Hx   . Rectal cancer Neg Hx   . Stomach cancer Neg Hx     ROS: Review of Systems  Constitutional: Positive for weight loss.  Endo/Heme/Allergies:  Negative hypoglycemia    PHYSICAL EXAM: Pt in no acute distress  RECENT LABS AND TESTS: BMET    Component Value Date/Time   NA 141 10/16/2018 0948   K 4.5 10/16/2018 0948   CL 102 10/16/2018 0948   CO2 25 10/16/2018 0948   GLUCOSE 102 (H) 10/16/2018 0948   GLUCOSE 124 (H) 07/09/2018 0925   BUN 15 10/16/2018 0948   CREATININE 0.67 10/16/2018 0948   CREATININE 0.85 03/07/2016 1654   CALCIUM 9.1 10/16/2018 0948   GFRNONAA 95 10/16/2018 0948   GFRAA 110 10/16/2018 0948   Lab Results  Component Value Date   HGBA1C 6.4 (H) 10/16/2018   HGBA1C 6.3 (H) 07/09/2018   HGBA1C 6.7 (H) 05/07/2018   Lab Results  Component Value Date   INSULIN 44.1 (H) 10/16/2018   INSULIN 39.8 (H) 05/07/2018   CBC    Component Value Date/Time   WBC 7.9 10/16/2018 0948   WBC 7.5 07/09/2018 0925   RBC 5.81 (H) 10/16/2018 0948   RBC 6.24 (H) 07/09/2018 0925   HGB 12.5 10/16/2018 0948   HCT 40.5 10/16/2018 0948   PLT 270 07/09/2018 0925   MCV 70 (L) 10/16/2018 0948   MCH 21.5 (L) 10/16/2018 0948   MCH 21.0 (L) 07/09/2018 0925   MCHC 30.9 (L) 10/16/2018 0948   MCHC 29.2 (L) 07/09/2018 0925   RDW 17.7 (H) 10/16/2018 0948   LYMPHSABS 2.6 10/16/2018 0948   MONOABS 0.5 07/14/2017 2115   EOSABS 0.2 10/16/2018 0948   BASOSABS 0.0 10/16/2018 0948   Iron/TIBC/Ferritin/ %Sat No results found for: IRON, TIBC, FERRITIN, IRONPCTSAT Lipid Panel     Component Value Date/Time   CHOL 152 10/16/2018 0948   TRIG 75 10/16/2018 0948   HDL 37 (L) 10/16/2018 0948   CHOLHDL 4.1 Ratio 09/26/2009 1823   VLDL 26 09/26/2009 1823   LDLCALC 100 (H) 10/16/2018 0948   Hepatic Function Panel     Component Value  Date/Time   PROT 7.1 10/16/2018 0948   ALBUMIN 3.9 10/16/2018 0948   AST 12 10/16/2018 0948   ALT 7 10/16/2018 0948   ALKPHOS 92 10/16/2018 0948   BILITOT 0.3 10/16/2018 0948      Component Value Date/Time   TSH 1.470 05/07/2018 1424   TSH 1.55 11/25/2008 1700      I, Trixie Dredge, am acting as transcriptionist for Ilene Qua, MD  I have reviewed the above documentation for accuracy and completeness, and I agree with the above. - Ilene Qua, MD

## 2018-12-17 ENCOUNTER — Ambulatory Visit (INDEPENDENT_AMBULATORY_CARE_PROVIDER_SITE_OTHER): Payer: 59 | Admitting: Family Medicine

## 2018-12-17 ENCOUNTER — Other Ambulatory Visit: Payer: Self-pay

## 2018-12-17 ENCOUNTER — Encounter (INDEPENDENT_AMBULATORY_CARE_PROVIDER_SITE_OTHER): Payer: Self-pay | Admitting: Family Medicine

## 2018-12-17 DIAGNOSIS — E7849 Other hyperlipidemia: Secondary | ICD-10-CM

## 2018-12-17 DIAGNOSIS — Z6837 Body mass index (BMI) 37.0-37.9, adult: Secondary | ICD-10-CM | POA: Diagnosis not present

## 2018-12-17 DIAGNOSIS — E1165 Type 2 diabetes mellitus with hyperglycemia: Secondary | ICD-10-CM

## 2018-12-17 MED ORDER — METFORMIN HCL 500 MG PO TABS: 500.0000 mg | ORAL_TABLET | Freq: Two times a day (BID) | ORAL | 0 refills | Status: DC

## 2018-12-17 NOTE — Progress Notes (Signed)
Office: [phone]  /  Fax: [phone] TeleHealth Visit:  Andrea Montes has verbally consented to this TeleHealth visit today. The patient is located at home, the provider is located at the [facility] office. The participants in this visit include the listed provider and patient. The visit was conducted today via webex.  HPI:   Chief Complaint: OBESITY Andrea Montes is here to discuss her progress with her obesity treatment plan. She is on the Category 3 plan and is following her eating plan approximately 75 % of the time. She states she is walking for 30 minutes 7 times per week. Andrea Montes is working from home and trying to [employer]. She is anxious to get back into the office. She weighed at 264 lbs the last time she weighed. She is occasionally ordering takeout, but making healthier choices when eating out. She is not having a difficult time getting food on the plan.  We were unable to weigh the patient today for this TeleHealth visit. She feels as if she has lost 1 lb since her last visit. She has lost 3-4 lbs since starting treatment with [name].  Diabetes II with Hyperglycemia Andrea Montes has a diagnosis of diabetes type II. Marcos notes minimal carbohydrate cravings. She denies GI side effects of metformin. Last A1c was 6.4. She denies hypoglycemia. She has been working on intensive lifestyle modifications including diet, exercise, and weight loss to help control her blood glucose levels.  Hyperlipidemia Andrea Montes has hyperlipidemia and has been trying to improve her cholesterol levels with intensive lifestyle modification including a low saturated fat diet, exercise and weight loss. Last LDL was elevated at 100. She is on statin and denies any chest pain, claudication or myalgias.  ASSESSMENT AND PLAN:  Type 2 diabetes mellitus with hyperglycemia, without long-term current use of insulin ([place]) - Plan: metFORMIN (GLUCOPHAGE) 500 MG tablet  Other hyperlipidemia  Class 2 severe  obesity with serious comorbidity and body mass index (BMI) of 37.0 to 37.9 in adult, unspecified obesity type ([place])  PLAN:  Diabetes II with Hyperglycemia Andrea Montes has been given extensive diabetes education by myself today including ideal fasting and post-prandial blood glucose readings, individual ideal Hgb A1c goals and hypoglycemia prevention. We discussed the importance of good blood sugar control to decrease the likelihood of diabetic complications such as nephropathy, neuropathy, limb loss, blindness, coronary artery disease, and death. We discussed the importance of intensive lifestyle modification including diet, exercise and weight loss as the first line treatment for diabetes. Andrea Montes agrees to continue taking metformin 500 mg PO BID #60 and we will refill for 1 month. Andrea Montes agrees to follow up with our clinic in 2 weeks.  Hyperlipidemia Andrea Montes was informed of the American Heart Association Guidelines emphasizing intensive lifestyle modifications as the first line treatment for hyperlipidemia. We discussed many lifestyle modifications today in depth, and Andrea Montes will continue to work on decreasing saturated fats such as fatty red meat, butter and many fried foods. She will also increase vegetables and lean protein in her diet and continue to work on exercise and weight loss efforts. We will repeat labs in late May. Andrea Montes agrees to follow up with our clinic in 2 weeks.  Obesity Andrea Montes is currently in the action stage of change. As such, her goal is to continue with weight loss efforts She has agreed to follow the Category 3 plan Andrea Montes has been instructed to work up to a goal of 150 minutes of combined cardio and strengthening exercise per week for  weight loss and overall health benefits. We discussed the following Behavioral Modification Strategies today: increasing lean protein intake, increasing vegetables and work on meal planning and easy cooking plans, and planning for success    Andrea Montes has agreed to follow up with our clinic in 2 weeks. She was informed of the importance of frequent follow up visits to maximize her success with intensive lifestyle modifications for her multiple health conditions.  ALLERGIES: Allergies  Allergen Reactions  . Kiwi Extract Itching and Other (See Comments)    Itching in mouth with KIWI, And pineapple juice- topically irritating     MEDICATIONS: Current Outpatient Medications on File Prior to Visit  Medication Sig Dispense Refill  . albuterol (PROVENTIL HFA;VENTOLIN HFA) 108 (90 Base) MCG/ACT inhaler Inhale 2 puffs into the lungs every 6 (six) hours as needed for up to 10 days for wheezing or shortness of breath. 1 Inhaler 0  . Aspirin-Acetaminophen-Caffeine (GOODY HEADACHE PO) Take 1 Package by mouth as needed (pain).    [name] atorvastatin (LIPITOR) 10 MG tablet Take 1 tablet (10 mg total) by mouth at bedtime. 30 tablet 0  . bacitracin 500 UNIT/GM ointment Apply 1 application topically 2 (two) times daily. (Patient not taking: Reported on 10/31/2018) 15 g 0  . calcium carbonate (TUMS EX) 750 MG chewable tablet Chew 1-2 tablets by mouth 2 (two) times daily as needed for heartburn.    . fluticasone (FLONASE) 50 MCG/ACT nasal spray Place 2 sprays into both nostrils daily for 10 days. 16 g 0  . furosemide (LASIX) 20 MG tablet Take 1 tablet (20 mg total) by mouth daily. (Patient taking differently: Take 20 mg by mouth daily as needed for fluid. ) 30 tablet 11  . meloxicam (MOBIC) 15 MG tablet Take 1 tablet (15 mg total) by mouth daily as needed for pain. (Patient taking differently: Take 15 mg by mouth daily. ) 60 tablet 1  . montelukast (SINGULAIR) 10 MG tablet Take 1 tablet (10 mg total) by mouth at bedtime. 30 tablet 0  . montelukast (SINGULAIR) 10 MG tablet Take 1 tablet (10 mg total) by mouth at bedtime for 30 days. 30 tablet 0  . oxyCODONE-acetaminophen (PERCOCET/ROXICET) 5-325 MG tablet Take  1 tab  po q 6 hours prn pain (Patient not taking:  Reported on 10/26/2018) 30 tablet 0  . phenazopyridine (PYRIDIUM) 100 MG tablet Take 1 tablet (100 mg total) by mouth 3 (three) times daily as needed for pain. 10 tablet 0  . SUMAtriptan (IMITREX) 25 MG tablet Take 25 mg by mouth every 2 (two) hours as needed for migraine or headache.   1  . traMADol (ULTRAM) 50 MG tablet Take 1 tablet (50 mg total) by mouth every 6 (six) hours as needed. (Patient not taking: Reported on 10/31/2018) 60 tablet 2  . Vitamin D, Ergocalciferol, (DRISDOL) 1.25 MG (50000 UT) CAPS capsule Take 1 capsule (50,000 Units total) by mouth every 7 (seven) days. 4 capsule 0   Current Facility-Administered Medications on File Prior to Visit  Medication Dose Route Frequency Provider Last Rate Last Dose  . 0.9 %  sodium chloride infusion  500 mL Intravenous Continuous Nandigam, Venia Minks, MD        PAST MEDICAL HISTORY: Past Medical History:  Diagnosis Date  . Anemia    no current med.  [name] Apnea   . 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 Back pain   . Carpal tunnel syndrome   . Complication of anesthesia  itching from last surgery that was completed at the surgery center off of [address] patient was given benedryl, generalized itching and they are not sure where that came from  . DDD (degenerative disc disease), lumbar   . Dental crowns present   . Edema of lower extremity    bilateral  . Frozen shoulder    surgery 11-04-14  . GERD (gastroesophageal reflux disease)    no current med.  [name] GERD (gastroesophageal reflux disease)   . Heart murmur    last  echo- 2010  . History of blood transfusion   . History of shingles   . Hypertension    borderline not on medicaiton at this time  . Impingement syndrome of left shoulder region 10/2014  . Joint pain   . Migraines   . Obesity   . Pre-diabetes   . Rheumatoid arteritis ([place])   . Seasonal allergies   . Sleep apnea    uses CPAP nightly  . Trigger finger, left middle finger   . Vitamin D  deficiency   . Wears partial dentures    upper    PAST SURGICAL HISTORY: Past Surgical History:  Procedure Laterality Date  . CARPAL TUNNEL RELEASE Left 04/25/2006  . CARPAL TUNNEL RELEASE Right 07/15/2018   Procedure: RIGHT CARPAL TUNNEL RELEASE;  Surgeon: Meredith Pel, MD;  Location: [facility];  Service: Orthopedics;  Laterality: Right;  . CLOSED MANIPULATION SHOULDER WITH STERIOD INJECTION Left 02/03/2015   Procedure: LEFT  MANIPULATION SHOULDER UNDER ANESTHESIA WITH INJECTION OF STEROID;  Surgeon: Kathryne Hitch, MD;  Location: [facility];  Service: Orthopedics;  Laterality: Left;  . COLONOSCOPY    . COLONOSCOPY WITH PROPOFOL  10/09/2011  . POLYPECTOMY    . RESECTION DISTAL CLAVICAL Left 11/04/2014   Procedure: RESECTION DISTAL CLAVICAL;  Surgeon: Kathryne Hitch, MD;  Location: [facility];  Service: Orthopedics;  Laterality: Left;  . ROTATOR CUFF REPAIR  2010  . SHOULDER ARTHROSCOPY WITH ROTATOR CUFF REPAIR Right 10/04/2008  . SHOULDER ARTHROSCOPY WITH SUBACROMIAL DECOMPRESSION, ROTATOR CUFF REPAIR AND BICEP TENDON REPAIR Left 11/04/2014   Procedure: LEFT SHOULDER ARTHROSCOPY WITH DEBRIDEMENT, DISTAL CLAVICLE EXCISION, ACROMIOPLASTY, ROTATOR CUFF REPAIR AND BICEP TENODESIS;  Surgeon: Kathryne Hitch, MD;  Location: [facility];  Service: Orthopedics;  Laterality: Left;  . TRIGGER FINGER RELEASE Right 02/03/2015   Procedure: RIGHT LONG FINGER TRIGGER RELEASE;  Surgeon: Kathryne Hitch, MD;  Location: [facility];  Service: Orthopedics;  Laterality: Right;  . TRIGGER FINGER RELEASE Left 10/10/2017   Procedure: LEFT HAND RELEASE TRIGGER FINGER 3RD LONG FINGER AND CYST EXCISION;  Surgeon: Meredith Pel, MD;  Location: [facility];  Service: Orthopedics;  Laterality: Left;  . TUBAL LIGATION    . VAGINAL HYSTERECTOMY  2001   partial    SOCIAL HISTORY: Social History   Tobacco Use  . Smoking status: Never Smoker  . Smokeless tobacco:  Never Used  Substance Use Topics  . Alcohol use: No  . Drug use: No    FAMILY HISTORY: Family History  Problem Relation Age of Onset  . Kidney disease Brother   . Hypertension Brother   . Leukemia Mother   . Heart disease Mother   . Stroke Mother   . Cancer Mother   . Depression Mother   . Anxiety disorder Mother   . AAA (abdominal aortic aneurysm) Father   . Esophageal cancer Maternal Uncle   . Hypertension Brother   . Colon cancer Neg Hx   .  Colon polyps Neg Hx   . Rectal cancer Neg Hx   . Stomach cancer Neg Hx     ROS: Review of Systems  Constitutional: Positive for weight loss.  Cardiovascular: Negative for chest pain and claudication.  Musculoskeletal: Negative for myalgias.  Endo/Heme/Allergies:       Negative hypoglycemia    PHYSICAL EXAM: Pt in no acute distress  RECENT LABS AND TESTS: BMET    Component Value Date/Time   NA 141 10/16/2018 0948   K 4.5 10/16/2018 0948   CL 102 10/16/2018 0948   CO2 25 10/16/2018 0948   GLUCOSE 102 (H) 10/16/2018 0948   GLUCOSE 124 (H) 07/09/2018 0925   BUN 15 10/16/2018 0948   CREATININE 0.67 10/16/2018 0948   CREATININE 0.85 03/07/2016 1654   CALCIUM 9.1 10/16/2018 0948   GFRNONAA 95 10/16/2018 0948   GFRAA 110 10/16/2018 0948   Lab Results  Component Value Date   HGBA1C 6.4 (H) 10/16/2018   HGBA1C 6.3 (H) 07/09/2018   HGBA1C 6.7 (H) 05/07/2018   Lab Results  Component Value Date   INSULIN 44.1 (H) 10/16/2018   INSULIN 39.8 (H) 05/07/2018   CBC    Component Value Date/Time   WBC 7.9 10/16/2018 0948   WBC 7.5 07/09/2018 0925   RBC 5.81 (H) 10/16/2018 0948   RBC 6.24 (H) 07/09/2018 0925   HGB 12.5 10/16/2018 0948   HCT 40.5 10/16/2018 0948   PLT 270 07/09/2018 0925   MCV 70 (L) 10/16/2018 0948   MCH 21.5 (L) 10/16/2018 0948   MCH 21.0 (L) 07/09/2018 0925   MCHC 30.9 (L) 10/16/2018 0948   MCHC 29.2 (L) 07/09/2018 0925   RDW 17.7 (H) 10/16/2018 0948   LYMPHSABS 2.6 10/16/2018 0948   MONOABS 0.5  07/14/2017 2115   EOSABS 0.2 10/16/2018 0948   BASOSABS 0.0 10/16/2018 0948   Iron/TIBC/Ferritin/ %Sat No results found for: IRON, TIBC, FERRITIN, IRONPCTSAT Lipid Panel     Component Value Date/Time   CHOL 152 10/16/2018 0948   TRIG 75 10/16/2018 0948   HDL 37 (L) 10/16/2018 0948   CHOLHDL 4.1 Ratio 09/26/2009 1823   VLDL 26 09/26/2009 1823   LDLCALC 100 (H) 10/16/2018 0948   Hepatic Function Panel     Component Value Date/Time   PROT 7.1 10/16/2018 0948   ALBUMIN 3.9 10/16/2018 0948   AST 12 10/16/2018 0948   ALT 7 10/16/2018 0948   ALKPHOS 92 10/16/2018 0948   BILITOT 0.3 10/16/2018 0948      Component Value Date/Time   TSH 1.470 05/07/2018 1424   TSH 1.55 11/25/2008 1700      I, Trixie Dredge, am acting as transcriptionist for Ilene Qua, MD  I have reviewed the above documentation for accuracy and completeness, and I agree with the above. - Ilene Qua, MD

## 2018-12-31 ENCOUNTER — Ambulatory Visit (INDEPENDENT_AMBULATORY_CARE_PROVIDER_SITE_OTHER): Payer: 59 | Admitting: Family Medicine

## 2019-01-26 MED FILL — SF 5000 PLUS CREAM: 1.1 | 30 days supply | Qty: 51 | Fill #0

## 2019-01-30 MED FILL — MELOXICAM 15 MG TABLET: 15 | 30 days supply | Qty: 90 | Fill #0

## 2019-01-30 MED FILL — METHOCARBAMOL 750 MG TABS: 750 | 10 days supply | Qty: 30 | Fill #0

## 2019-02-17 DIAGNOSIS — Z0000 Encounter for general adult medical examination without abnormal findings: Secondary | ICD-10-CM | POA: Diagnosis not present

## 2019-02-17 DIAGNOSIS — E559 Vitamin D deficiency, unspecified: Secondary | ICD-10-CM | POA: Diagnosis not present

## 2019-02-17 DIAGNOSIS — G43009 Migraine without aura, not intractable, without status migrainosus: Secondary | ICD-10-CM | POA: Diagnosis not present

## 2019-02-17 DIAGNOSIS — R5383 Other fatigue: Secondary | ICD-10-CM | POA: Diagnosis not present

## 2019-02-17 DIAGNOSIS — E1169 Type 2 diabetes mellitus with other specified complication: Secondary | ICD-10-CM | POA: Diagnosis not present

## 2019-02-17 DIAGNOSIS — Z13220 Encounter for screening for lipoid disorders: Secondary | ICD-10-CM | POA: Diagnosis not present

## 2019-02-17 DIAGNOSIS — G4730 Sleep apnea, unspecified: Secondary | ICD-10-CM | POA: Diagnosis not present

## 2019-02-18 MED FILL — SUMATRIPTAN SUCC 25 MG TAB: 25 | 30 days supply | Qty: 15 | Fill #0

## 2019-02-18 MED FILL — VIT D2 1.25 MG (50,000 UNIT: 1.25 MG | 70 days supply | Qty: 10 | Fill #0

## 2019-02-18 MED FILL — metFORMIN HCL 1000 MG TABS: 1000 | 90 days supply | Qty: 360 | Fill #0

## 2019-03-07 DIAGNOSIS — Z1159 Encounter for screening for other viral diseases: Secondary | ICD-10-CM | POA: Diagnosis not present

## 2019-05-04 DIAGNOSIS — Z01419 Encounter for gynecological examination (general) (routine) without abnormal findings: Secondary | ICD-10-CM | POA: Diagnosis not present

## 2019-05-04 DIAGNOSIS — H5203 Hypermetropia, bilateral: Secondary | ICD-10-CM | POA: Diagnosis not present

## 2019-05-04 DIAGNOSIS — Z6841 Body Mass Index (BMI) 40.0 and over, adult: Secondary | ICD-10-CM | POA: Diagnosis not present

## 2019-05-04 DIAGNOSIS — Z1231 Encounter for screening mammogram for malignant neoplasm of breast: Secondary | ICD-10-CM | POA: Diagnosis not present

## 2019-10-05 MED FILL — OMRON 3 SERIES BP MONITOR D: 1 days supply | Qty: 1 | Fill #0

## 2019-10-07 ENCOUNTER — Telehealth: Payer: Self-pay

## 2019-10-07 MED ORDER — LOSARTAN POTASSIUM 50 MG PO TABS: 50.0000 mg | ORAL_TABLET | Freq: Every day | ORAL | 3 refills | Status: DC

## 2019-10-07 MED ORDER — FUROSEMIDE 20 MG PO TABS: 20.0000 mg | ORAL_TABLET | Freq: Every day | ORAL | 3 refills | Status: DC

## 2019-10-07 MED FILL — FUROSEMIDE 20 MG TABS: 20 | 90 days supply | Qty: 90 | Fill #0

## 2019-10-07 MED FILL — LOSARTAN POTASSIUM 50 MG TA: 50 | 90 days supply | Qty: 90 | Fill #0

## 2019-10-07 NOTE — Telephone Encounter (Signed)
Patient aware of Dr. Kyla Balzarine recommendations. Will send Cozaar 50 mg by mouth daily to patient's pharmacy of choice.

## 2019-10-07 NOTE — Telephone Encounter (Signed)
F/U with primary call in cozaar 50 mg daily

## 2019-10-07 NOTE — Telephone Encounter (Signed)
Patient complaining of elevated BP 160/82, HR 70 -80's, BLE edema, lightheadedness at times, palpitations, and flutters for 2 weeks. Patient has taken lisinopril in the past, but was discontinue due to causing a cough. Patient has Lasix 20 mg daily that had not helped. Last office visit 08/11/18. Will forward to Dr. Johnsie Cancel for advisement.

## 2019-11-02 ENCOUNTER — Encounter (HOSPITAL_COMMUNITY): Payer: Self-pay

## 2019-11-02 ENCOUNTER — Ambulatory Visit (HOSPITAL_COMMUNITY)
Admission: EM | Admit: 2019-11-02 | Discharge: 2019-11-02 | Disposition: A | Discharge status: Home / Self Care | Payer: 59 | Attending: Family Medicine | Admitting: Family Medicine

## 2019-11-02 ENCOUNTER — Other Ambulatory Visit: Payer: Self-pay

## 2019-11-02 DIAGNOSIS — N3000 Acute cystitis without hematuria: Secondary | ICD-10-CM | POA: Diagnosis not present

## 2019-11-02 LAB — POCT URINALYSIS DIP (DEVICE)
Bilirubin Urine: NEGATIVE
Glucose, UA: NEGATIVE mg/dL
Ketones, ur: NEGATIVE mg/dL
Nitrite: POSITIVE — AB
Protein, ur: 100 mg/dL — AB
Specific Gravity, Urine: 1.025 (ref 1.005–1.030)
Urobilinogen, UA: 0.2 mg/dL (ref 0.0–1.0)
pH: 6 (ref 5.0–8.0)

## 2019-11-02 MED ORDER — CEPHALEXIN 500 MG PO CAPS: 500.0000 mg | ORAL_CAPSULE | Freq: Two times a day (BID) | ORAL | 0 refills | Status: AC

## 2019-11-02 MED ORDER — FLUCONAZOLE 150 MG PO TABS: ORAL_TABLET | ORAL | 0 refills | Status: DC

## 2019-11-02 NOTE — ED Provider Notes (Signed)
[facility]    CSN: [id number] Arrival date & time: 11/02/19  1757      History   Chief Complaint Chief Complaint  Patient presents with  . Urinary Frequency    HPI Andrea Montes is a 64 y.o. female.   Andrea Montes presents with complaints of  Pain with urination, frequency, pelvic pressure and low back pain. No fevers. No blood to urine. Has had similar in the past with UTI's. Last was approximately 1 year ago. Symptoms started approximately 1 week ago and have worsened.     ROS per HPI, negative if not otherwise mentioned.      Past Medical History:  Diagnosis Date  . Anemia    no current med.  [name] Apnea   . 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 Back pain   . Carpal tunnel syndrome   . Complication of anesthesia    itching from last surgery that was completed at the surgery center off of [address] patient was given benedryl, generalized itching and they are not sure where that came from  . DDD (degenerative disc disease), lumbar   . Dental crowns present   . Edema of lower extremity    bilateral  . Frozen shoulder    surgery 11-04-14  . GERD (gastroesophageal reflux disease)    no current med.  [name] GERD (gastroesophageal reflux disease)   . Heart murmur    last  echo- 2010  . History of blood transfusion   . History of shingles   . Hypertension    borderline not on medicaiton at this time  . Impingement syndrome of left shoulder region 10/2014  . Joint pain   . Migraines   . Obesity   . Pre-diabetes   . Rheumatoid arteritis ([place])   . Seasonal allergies   . Sleep apnea    uses CPAP nightly  . Trigger finger, left middle finger   . Vitamin D deficiency   . Wears partial dentures    upper    Patient Active Problem List   Diagnosis Date Noted  . Type 2 diabetes mellitus without complication, without long-term current use of insulin ([place]) 10/01/2018  . Trigger finger, acquired 09/09/2015  . Low back pain  radiating to both legs 05/06/2012  . Leg length discrepancy 05/06/2012  . HYPERTENSION, BENIGN 04/11/2009  . LEG EDEMA, BILATERAL 10/08/2008  . 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 OVARIAN FAILURE 09/02/2007  . VITAMIN D DEFICIENCY 09/02/2007  . OBESITY, NOS 10/17/2006  . ANEMIA, IRON DEFICIENCY, UNSPEC. 10/17/2006  . Carpal tunnel syndrome, right upper limb 10/17/2006    Past Surgical History:  Procedure Laterality Date  . CARPAL TUNNEL RELEASE Left 04/25/2006  . CARPAL TUNNEL RELEASE Right 07/15/2018   Procedure: RIGHT CARPAL TUNNEL RELEASE;  Surgeon: Meredith Pel, MD;  Location: [facility];  Service: Orthopedics;  Laterality: Right;  . CLOSED MANIPULATION SHOULDER WITH STERIOD INJECTION Left 02/03/2015   Procedure: LEFT  MANIPULATION SHOULDER UNDER ANESTHESIA WITH INJECTION OF STEROID;  Surgeon: Kathryne Hitch, MD;  Location: [facility];  Service: Orthopedics;  Laterality: Left;  . COLONOSCOPY    . COLONOSCOPY WITH PROPOFOL  10/09/2011  . POLYPECTOMY    . RESECTION DISTAL CLAVICAL Left 11/04/2014   Procedure: RESECTION DISTAL CLAVICAL;  Surgeon: Kathryne Hitch, MD;  Location: [facility];  Service: Orthopedics;  Laterality: Left;  . ROTATOR CUFF REPAIR  2010  . SHOULDER ARTHROSCOPY WITH ROTATOR  CUFF REPAIR Right 10/04/2008  . SHOULDER ARTHROSCOPY WITH SUBACROMIAL DECOMPRESSION, ROTATOR CUFF REPAIR AND BICEP TENDON REPAIR Left 11/04/2014   Procedure: LEFT SHOULDER ARTHROSCOPY WITH DEBRIDEMENT, DISTAL CLAVICLE EXCISION, ACROMIOPLASTY, ROTATOR CUFF REPAIR AND BICEP TENODESIS;  Surgeon: Kathryne Hitch, MD;  Location: [facility];  Service: Orthopedics;  Laterality: Left;  . TRIGGER FINGER RELEASE Right 02/03/2015   Procedure: RIGHT LONG FINGER TRIGGER RELEASE;  Surgeon: Kathryne Hitch, MD;  Location: [facility];  Service: Orthopedics;  Laterality: Right;  . TRIGGER FINGER RELEASE Left 10/10/2017   Procedure: LEFT HAND  RELEASE TRIGGER FINGER 3RD LONG FINGER AND CYST EXCISION;  Surgeon: Meredith Pel, MD;  Location: [facility];  Service: Orthopedics;  Laterality: Left;  . TUBAL LIGATION    . VAGINAL HYSTERECTOMY  2001   partial    OB History    Gravida  2   Para  2   Term      Preterm      AB      Living        SAB      TAB      Ectopic      Multiple      Live Births               Home Medications    Prior to Admission medications   Medication Sig Start Date End Date Taking? Authorizing Provider  Aspirin-Acetaminophen-Caffeine (GOODY HEADACHE PO) Take 1 Package by mouth as needed (pain).   Yes [provider]  atorvastatin (LIPITOR) 10 MG tablet Take 1 tablet (10 mg total) by mouth at bedtime. 07/03/18  Yes Whitmire, Dawn W, FNP  calcium carbonate (TUMS EX) 750 MG chewable tablet Chew 1-2 tablets by mouth 2 (two) times daily as needed for heartburn.   Yes [provider]  furosemide (LASIX) 20 MG tablet Take 1 tablet (20 mg total) by mouth daily. 10/07/19  Yes Josue Hector, MD  losartan (COZAAR) 50 MG tablet Take 1 tablet (50 mg total) by mouth daily. 10/07/19  Yes Josue Hector, MD  meloxicam (MOBIC) 15 MG tablet Take 1 tablet (15 mg total) by mouth daily as needed for pain. Patient taking differently: Take 15 mg by mouth daily.  05/02/17  Yes Lilia Argue R, DO  metFORMIN (GLUCOPHAGE) 500 MG tablet Take 1 tablet (500 mg total) by mouth 2 (two) times daily with a meal. 12/17/18  Yes Eber Jones, MD  SUMAtriptan (IMITREX) 25 MG tablet Take 25 mg by mouth every 2 (two) hours as needed for migraine or headache.  03/06/16  Yes [provider]  traMADol (ULTRAM) 50 MG tablet Take 1 tablet (50 mg total) by mouth every 6 (six) hours as needed. 05/02/17  Yes Draper, Carlos Levering, DO  Vitamin D, Ergocalciferol, (DRISDOL) 1.25 MG (50000 UT) CAPS capsule Take 1 capsule (50,000 Units total) by mouth every 7 (seven) days. 10/16/18  Yes Eber Jones,  MD  albuterol (PROVENTIL HFA;VENTOLIN HFA) 108 (90 Base) MCG/ACT inhaler Inhale 2 puffs into the lungs every 6 (six) hours as needed for up to 10 days for wheezing or shortness of breath. 12/29/17 07/15/18  Kara Dies, NP  bacitracin 500 UNIT/GM ointment Apply 1 application topically 2 (two) times daily. Patient not taking: Reported on 10/31/2018 10/28/17   Barnet Glasgow, NP  cephALEXin (KEFLEX) 500 MG capsule Take 1 capsule (500 mg total) by mouth 2 (two) times daily for 7 days. 11/02/19 11/09/19  Zigmund Gottron,  NP  fluconazole (DIFLUCAN) 150 MG tablet 1 tablet at completion of antibiotics if needed for yeast 11/02/19   Augusto Gamble B, NP  fluticasone (FLONASE) 50 MCG/ACT nasal spray Place 2 sprays into both nostrils daily for 10 days. 10/31/18 11/10/18  Kara Dies, NP  montelukast (SINGULAIR) 10 MG tablet Take 1 tablet (10 mg total) by mouth at bedtime. 01/27/18   Kara Dies, NP  montelukast (SINGULAIR) 10 MG tablet Take 1 tablet (10 mg total) by mouth at bedtime for 30 days. 10/31/18 11/30/18  Kara Dies, NP  oxyCODONE-acetaminophen (PERCOCET/ROXICET) 5-325 MG tablet Take  1 tab  po q 6 hours prn pain Patient not taking: Reported on 10/26/2018 07/16/18   Meredith Pel, MD  phenazopyridine (PYRIDIUM) 100 MG tablet Take 1 tablet (100 mg total) by mouth 3 (three) times daily as needed for pain. 10/28/17   Barnet Glasgow, NP    Family History Family History  Problem Relation Age of Onset  . Kidney disease Brother   . Hypertension Brother   . Leukemia Mother   . Heart disease Mother   . Stroke Mother   . Cancer Mother   . Depression Mother   . Anxiety disorder Mother   . AAA (abdominal aortic aneurysm) Father   . Esophageal cancer Maternal Uncle   . Hypertension Brother   . Colon cancer Neg Hx   . Colon polyps Neg Hx   . Rectal cancer Neg Hx   . Stomach cancer Neg Hx     Social History Social History   Tobacco Use  . Smoking  status: Never Smoker  . Smokeless tobacco: Never Used  Substance Use Topics  . Alcohol use: No  . Drug use: No     Allergies   Kiwi extract   Review of Systems Review of Systems   Physical Exam Triage Vital Signs ED Triage Vitals  Enc Vitals Group     BP 11/02/19 1852 (!) 142/66     Pulse Rate 11/02/19 1852 65     Resp 11/02/19 1852 16     Temp 11/02/19 1852 98.2 F (36.8 C)     Temp Source 11/02/19 1852 Oral     SpO2 11/02/19 1852 97 %     Weight 11/02/19 1846 260 lb (117.9 kg)     Height 11/02/19 1846 5\' 3"  (1.6 m)     Head Circumference --      Peak Flow --      Pain Score 11/02/19 1846 8     Pain Loc --      Pain Edu? --      Excl. in [place]? --    No data found.  Updated Vital Signs BP (!) 142/66 (BP Location: Left Arm)   Pulse 65   Temp 98.2 F (36.8 C) (Oral)   Resp 16   Ht 5\' 3"  (1.6 m)   Wt 260 lb (117.9 kg)   SpO2 97%   BMI 46.06 kg/m    Physical Exam Constitutional:      General: She is not in acute distress.    Appearance: She is well-developed.  Cardiovascular:     Rate and Rhythm: Normal rate.  Pulmonary:     Effort: Pulmonary effort is normal.  Abdominal:     Tenderness: There is no right CVA tenderness or left CVA tenderness.     Comments: Mild suprapubic pressure on palpation   Skin:    General: Skin is warm and dry.  Neurological:  Mental Status: She is alert and oriented to person, place, and time.      UC Treatments / Results  Labs (all labs ordered are listed, but only abnormal results are displayed) Labs Reviewed  POCT URINALYSIS DIP (DEVICE) - Abnormal; Notable for the following components:      Result Value   Hgb urine dipstick MODERATE (*)    Protein, ur 100 (*)    Nitrite POSITIVE (*)    Leukocytes,Ua SMALL (*)    All other components within normal limits  URINE CULTURE    EKG   Radiology No results found.  Procedures Procedures (including critical care time)  Medications Ordered in UC Medications -  No data to display  Initial Impression / Assessment and Plan / UC Course  I have reviewed the triage vital signs and the nursing notes.  Pertinent labs & imaging results that were available during my care of the patient were reviewed by me and considered in my medical decision making (see chart for details).     UA consistent with UTI. Culture obtained and in process with keflex initiated. Return precautions provided. Patient verbalized understanding and agreeable to plan.   Final Clinical Impressions(s) / UC Diagnoses   Final diagnoses:  Acute cystitis without hematuria     Discharge Instructions     Your urine is consistent with urinary tract infection.  Drink plenty of water to empty bladder regularly. Avoid alcohol and caffeine as these may irritate the bladder.   Complete course of antibiotics.  If symptoms worsen or do not improve in the next week to return to be seen or to follow up with your PCP.      ED Prescriptions    Medication Sig Dispense Auth. Provider   cephALEXin (KEFLEX) 500 MG capsule Take 1 capsule (500 mg total) by mouth 2 (two) times daily for 7 days. 14 capsule Augusto Gamble B, NP   fluconazole (DIFLUCAN) 150 MG tablet 1 tablet at completion of antibiotics if needed for yeast 1 tablet Zigmund Gottron, NP     PDMP not reviewed this encounter.   Zigmund Gottron, NP 11/02/19 1934

## 2019-11-02 NOTE — ED Triage Notes (Signed)
Patient complains of urinary frequency, and urgency with burning x 1 week.

## 2019-11-02 NOTE — Discharge Instructions (Signed)
Your urine is consistent with urinary tract infection.  Drink plenty of water to empty bladder regularly. Avoid alcohol and caffeine as these may irritate the bladder.   Complete course of antibiotics.  If symptoms worsen or do not improve in the next week to return to be seen or to follow up with your PCP.

## 2019-11-03 MED FILL — FLUCONAZOLE 150 MG TABLET: 150 | 1 days supply | Qty: 1 | Fill #0

## 2019-11-03 MED FILL — CEPHALEXIN 500 MG CAPSULE: 500 | 7 days supply | Qty: 14 | Fill #0

## 2019-11-04 LAB — URINE CULTURE: Culture: >=100000 — AB

## 2019-11-17 NOTE — Telephone Encounter (Signed)
disregard

## 2019-11-23 MED FILL — SUMATRIPTAN SUCC 25 MG TAB: 25 | 30 days supply | Qty: 15 | Fill #1

## 2019-12-14 DIAGNOSIS — E7800 Pure hypercholesterolemia, unspecified: Secondary | ICD-10-CM | POA: Diagnosis not present

## 2019-12-14 DIAGNOSIS — I10 Essential (primary) hypertension: Secondary | ICD-10-CM | POA: Diagnosis not present

## 2019-12-14 DIAGNOSIS — R350 Frequency of micturition: Secondary | ICD-10-CM | POA: Diagnosis not present

## 2019-12-14 DIAGNOSIS — E1169 Type 2 diabetes mellitus with other specified complication: Secondary | ICD-10-CM | POA: Diagnosis not present

## 2019-12-14 DIAGNOSIS — G4733 Obstructive sleep apnea (adult) (pediatric): Secondary | ICD-10-CM | POA: Diagnosis not present

## 2019-12-15 MED FILL — UNIFINE PENTIPS 6MM 31G: 31G X 6 MM | 90 days supply | Qty: 100 | Fill #0

## 2019-12-16 MED FILL — METHOCARBAMOL 750 MG TABS: 750 | 10 days supply | Qty: 30 | Fill #1

## 2019-12-17 MED FILL — METFORMIN HCL ER 500 MG TB2: 500 | 90 days supply | Qty: 180 | Fill #0

## 2019-12-17 MED FILL — BASAGLAR 100 UNIT/ML KWIKPE: 100 | 90 days supply | Qty: 9 | Fill #0

## 2019-12-17 MED FILL — FREESTYLE LITE METER: 30 days supply | Qty: 1 | Fill #0

## 2019-12-17 MED FILL — FREESTYLE LANCETS: 90 days supply | Qty: 300 | Fill #0

## 2019-12-17 MED FILL — FREESTYLE LITE TEST STRIP: 83 days supply | Qty: 250 | Fill #0

## 2020-01-07 ENCOUNTER — Telehealth: Payer: Self-pay

## 2020-01-07 NOTE — Telephone Encounter (Signed)
Patient wanted Dr. Johnsie Cancel to know that she has been diagnosed with diabetes. Patient sated she is now taking metformin HCI ER 1000 mg (2 tablets) daily and Basaglar Insulin 10 units at bedtime. Updated patient's medication list. Will forward to Dr. Johnsie Cancel, so he is aware.

## 2020-02-19 ENCOUNTER — Other Ambulatory Visit (HOSPITAL_COMMUNITY): Payer: Self-pay | Admitting: Internal Medicine

## 2020-02-19 DIAGNOSIS — E1169 Type 2 diabetes mellitus with other specified complication: Secondary | ICD-10-CM | POA: Diagnosis not present

## 2020-02-19 DIAGNOSIS — Z0000 Encounter for general adult medical examination without abnormal findings: Secondary | ICD-10-CM | POA: Diagnosis not present

## 2020-02-19 DIAGNOSIS — Z23 Encounter for immunization: Secondary | ICD-10-CM | POA: Diagnosis not present

## 2020-02-19 DIAGNOSIS — I10 Essential (primary) hypertension: Secondary | ICD-10-CM | POA: Diagnosis not present

## 2020-02-19 DIAGNOSIS — E7800 Pure hypercholesterolemia, unspecified: Secondary | ICD-10-CM | POA: Diagnosis not present

## 2020-02-19 MED FILL — JARDIANCE 10 MG TABLET: 10 | 90 days supply | Qty: 90 | Fill #0

## 2020-02-19 MED FILL — METFORMIN HCL ER 750 MG TAB: 750 | 90 days supply | Qty: 180 | Fill #0

## 2020-03-09 ENCOUNTER — Ambulatory Visit (INDEPENDENT_AMBULATORY_CARE_PROVIDER_SITE_OTHER): Payer: 59 | Admitting: Orthopedic Surgery

## 2020-03-09 ENCOUNTER — Ambulatory Visit (INDEPENDENT_AMBULATORY_CARE_PROVIDER_SITE_OTHER): Payer: 59

## 2020-03-09 ENCOUNTER — Encounter: Payer: Self-pay | Admitting: Orthopedic Surgery

## 2020-03-09 VITALS — Ht 63.0 in | Wt 262.0 lb

## 2020-03-09 DIAGNOSIS — M79641 Pain in right hand: Secondary | ICD-10-CM | POA: Diagnosis not present

## 2020-03-09 DIAGNOSIS — M65341 Trigger finger, right ring finger: Secondary | ICD-10-CM | POA: Diagnosis not present

## 2020-03-10 LAB — URIC ACID: Uric Acid, Serum: 6.7 mg/dL (ref 2.5–7.0)

## 2020-03-10 LAB — CYCLIC CITRUL PEPTIDE ANTIBODY, IGG: Cyclic Citrullin Peptide Ab: <16 UNITS

## 2020-03-10 LAB — RHEUMATOID FACTOR: Rheumatoid fact SerPl-aCnc: <14 IU/mL (ref <14)

## 2020-03-10 LAB — SEDIMENTATION RATE: Sed Rate: 19 mm/h (ref 0–30)

## 2020-03-10 NOTE — Progress Notes (Signed)
Labs okay.  No rheumatoid arthritis.  Please call.  Thanks

## 2020-03-12 ENCOUNTER — Encounter: Payer: Self-pay | Admitting: Orthopedic Surgery

## 2020-03-12 DIAGNOSIS — M65341 Trigger finger, right ring finger: Secondary | ICD-10-CM | POA: Diagnosis not present

## 2020-03-12 DIAGNOSIS — M79641 Pain in right hand: Secondary | ICD-10-CM | POA: Diagnosis not present

## 2020-03-12 MED ORDER — LIDOCAINE HCL 1 % IJ SOLN
3.0000 mL | INTRAMUSCULAR | Status: AC | PRN
  Administered 2020-03-12: 3 mL

## 2020-03-12 MED ORDER — BUPIVACAINE HCL 0.25 % IJ SOLN
0.3300 mL | INTRAMUSCULAR | Status: AC | PRN
  Administered 2020-03-12: .33 mL

## 2020-03-12 MED ORDER — METHYLPREDNISOLONE ACETATE 40 MG/ML IJ SUSP
13.3300 mg | INTRAMUSCULAR | Status: AC | PRN
  Administered 2020-03-12: 13.33 mg

## 2020-03-12 NOTE — Progress Notes (Signed)
Office Visit Note   Patient: Andrea Montes           Date of Birth: 08/06/1956           MRN: [id number] Visit Date: 03/09/2020 Requested by: Seward Carol, MD [address] [place],  [place] [address] PCP: Seward Carol, MD  Subjective: Chief Complaint  Patient presents with  . Right Hand - Pain    HPI: Andrea Montes is a 64 y.o. female who presents to the office complaining of right hand pain.  She complains of right hand pain over the last 2 weeks.  She has a right fourth finger trigger finger that has been worsening over the last 2 weeks.  She has history of multiple trigger fingers with trigger finger release of the bilateral third finger.  She also has a history of right carpal tunnel release.  She has recently been diagnosed as a type II diabetic and has started insulin and Metformin.  Most recent A1c was 8.9.  She has never had a right ring finger injected for trigger finger.  She notes that her mother and great grandmother had rheumatoid arthritis.  She has been taking tramadol meloxicam for her hand pain that has provided no relief.  She states pain is 9/10 in the morning and improves to 4-5/10 during the day..                ROS: All systems reviewed are negative as they relate to the chief complaint within the history of present illness.  Patient denies fevers or chills.  Assessment & Plan: Visit Diagnoses:  1. Pain in right hand   2. Trigger finger, right ring finger     Plan: Patient is a 64 year old female presents complaining of right hand pain.  She has history of multiple trigger fingers as well as right carpal tunnel release.  Her main complaint today is right ring finger trigger finger.  She is having locking symptoms every day regarding the right ring finger.  She does have diffuse tenderness to palpation throughout multiple joints in the right hand that do not correlate with radiographic findings.  With patient's family history of rheumatoid  arthritis, ordered multiple labs on patient for evaluation of rheumatoid arthritis including rheumatoid factor, ESR, anti-CCP antibody, uric acid.  Patient does have diabetes with latest A1c of 8.9 but with the severity of her symptoms, plan to administer cortisone injection today under ultrasound guidance for the trigger finger.  Patient understands that this will raise her blood sugar and she should monitor this closely.  It is a small volume of cortisone so should not have a significant effect.  Cortisone injection administered under ultrasound guidance into the right ring finger A1 pulley.  Patient tolerated procedure well.  Follow-up as needed.  Follow-Up Instructions: No follow-ups on file.   Orders:  Orders Placed This Encounter  Procedures  . XR Hand Complete Right  . Sed Rate (ESR)  . Cyclic Citrul Peptide Antibody, IGG  . Uric acid  . Rheumatoid Factor   No orders of the defined types were placed in this encounter.     Procedures: Hand/UE Inj: R ring A1 for trigger finger on 03/12/2020 10:33 PM Indications: therapeutic Details: 25 G needle, volar approach Medications: 0.33 mL bupivacaine 0.25 %; 13.33 mg methylPREDNISolone acetate 40 MG/ML; 3 mL lidocaine 1 % Outcome: tolerated well, no immediate complications Procedure, treatment alternatives, risks and benefits explained, specific risks discussed. Consent was given by the patient.  Immediately prior to procedure a time out was called to verify the correct patient, procedure, equipment, support staff and site/side marked as required. Patient was prepped and draped in the usual sterile fashion.       Clinical Data: No additional findings.  Objective: Vital Signs: Ht 5' 3"  (1.6 m)   Wt 262 lb (118.8 kg)   BMI 46.41 kg/m   Physical Exam:  Constitutional: Patient appears well-developed HEENT:  Head: Normocephalic Eyes:EOM are normal Neck: Normal range of motion Cardiovascular: Normal rate Pulmonary/chest: Effort  normal Neurologic: Patient is alert Skin: Skin is warm Psychiatric: Patient has normal mood and affect  Ortho Exam: Orthopedic exam demonstrates diffuse tenderness to palpation throughout the right hand.  She has tenderness over multiple small joints of the right hand and over the A1 pulley of the right ring finger.  She has tenderness over the [facility] joint of the right hand.  Pain is worse with thumb circumduction and thumb adduction.  Negative Finkelstein's test.  Tenderness over the anatomic snuffbox, 3-4 portal, 4-5 portal, ulnar wrist.  Locking is observed of the right ring finger trigger finger.  Specialty Comments:  No specialty comments available.  Imaging: No results found.   PMFS History: Patient Active Problem List   Diagnosis Date Noted  . Type 2 diabetes mellitus without complication, without long-term current use of insulin ([place]) 10/01/2018  . Trigger finger, acquired 09/09/2015  . Low back pain radiating to both legs 05/06/2012  . Leg length discrepancy 05/06/2012  . HYPERTENSION, BENIGN 04/11/2009  . LEG EDEMA, BILATERAL 10/08/2008  . 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 OVARIAN FAILURE 09/02/2007  . VITAMIN D DEFICIENCY 09/02/2007  . OBESITY, NOS 10/17/2006  . ANEMIA, IRON DEFICIENCY, UNSPEC. 10/17/2006  . Carpal tunnel syndrome, right upper limb 10/17/2006   Past Medical History:  Diagnosis Date  . Anemia    no current med.  [name] Apnea   . 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 Back pain   . Carpal tunnel syndrome   . Complication of anesthesia    itching from last surgery that was completed at the surgery center off of [address] patient was given benedryl, generalized itching and they are not sure where that came from  . DDD (degenerative disc disease), lumbar   . Dental crowns present   . Edema of lower extremity    bilateral  . Frozen shoulder    surgery 11-04-14  . GERD (gastroesophageal reflux disease)     no current med.  [name] GERD (gastroesophageal reflux disease)   . Heart murmur    last  echo- 2010  . History of blood transfusion   . History of shingles   . Hypertension    borderline not on medicaiton at this time  . Impingement syndrome of left shoulder region 10/2014  . Joint pain   . Migraines   . Obesity   . Pre-diabetes   . Rheumatoid arteritis ([place])   . Seasonal allergies   . Sleep apnea    uses CPAP nightly  . Trigger finger, left middle finger   . Vitamin D deficiency   . Wears partial dentures    upper    Family History  Problem Relation Age of Onset  . Kidney disease Brother   . Hypertension Brother   . Leukemia Mother   . Heart disease Mother   . Stroke Mother   . Cancer Mother   . Depression Mother   .  Anxiety disorder Mother   . AAA (abdominal aortic aneurysm) Father   . Esophageal cancer Maternal Uncle   . Hypertension Brother   . Colon cancer Neg Hx   . Colon polyps Neg Hx   . Rectal cancer Neg Hx   . Stomach cancer Neg Hx     Past Surgical History:  Procedure Laterality Date  . CARPAL TUNNEL RELEASE Left 04/25/2006  . CARPAL TUNNEL RELEASE Right 07/15/2018   Procedure: RIGHT CARPAL TUNNEL RELEASE;  Surgeon: Meredith Pel, MD;  Location: [facility];  Service: Orthopedics;  Laterality: Right;  . CLOSED MANIPULATION SHOULDER WITH STERIOD INJECTION Left 02/03/2015   Procedure: LEFT  MANIPULATION SHOULDER UNDER ANESTHESIA WITH INJECTION OF STEROID;  Surgeon: Kathryne Hitch, MD;  Location: [facility];  Service: Orthopedics;  Laterality: Left;  . COLONOSCOPY    . COLONOSCOPY WITH PROPOFOL  10/09/2011  . POLYPECTOMY    . RESECTION DISTAL CLAVICAL Left 11/04/2014   Procedure: RESECTION DISTAL CLAVICAL;  Surgeon: Kathryne Hitch, MD;  Location: [facility];  Service: Orthopedics;  Laterality: Left;  . ROTATOR CUFF REPAIR  2010  . SHOULDER ARTHROSCOPY WITH ROTATOR CUFF REPAIR Right 10/04/2008  . SHOULDER ARTHROSCOPY WITH SUBACROMIAL  DECOMPRESSION, ROTATOR CUFF REPAIR AND BICEP TENDON REPAIR Left 11/04/2014   Procedure: LEFT SHOULDER ARTHROSCOPY WITH DEBRIDEMENT, DISTAL CLAVICLE EXCISION, ACROMIOPLASTY, ROTATOR CUFF REPAIR AND BICEP TENODESIS;  Surgeon: Kathryne Hitch, MD;  Location: [facility];  Service: Orthopedics;  Laterality: Left;  . TRIGGER FINGER RELEASE Right 02/03/2015   Procedure: RIGHT LONG FINGER TRIGGER RELEASE;  Surgeon: Kathryne Hitch, MD;  Location: [facility];  Service: Orthopedics;  Laterality: Right;  . TRIGGER FINGER RELEASE Left 10/10/2017   Procedure: LEFT HAND RELEASE TRIGGER FINGER 3RD LONG FINGER AND CYST EXCISION;  Surgeon: Meredith Pel, MD;  Location: [facility];  Service: Orthopedics;  Laterality: Left;  . TUBAL LIGATION    . VAGINAL HYSTERECTOMY  2001   partial   Social History   Occupational History  . Not on file  Tobacco Use  . Smoking status: Never Smoker  . Smokeless tobacco: Never Used  Vaping Use  . Vaping Use: Never used  Substance and Sexual Activity  . Alcohol use: No  . Drug use: No  . Sexual activity: Not on file

## 2020-03-28 MED FILL — BASAGLAR 100 UNIT/ML KWIKPE: 100 | 90 days supply | Qty: 9 | Fill #1

## 2020-03-28 MED FILL — UNIFINE PENTIPS 6MM 31G: 31G X 6 MM | 90 days supply | Qty: 100 | Fill #1

## 2020-03-29 ENCOUNTER — Other Ambulatory Visit (HOSPITAL_COMMUNITY): Payer: Self-pay | Admitting: Internal Medicine

## 2020-03-29 MED FILL — MELOXICAM 15 MG TABLET: 15 | 90 days supply | Qty: 90 | Fill #0

## 2020-03-29 MED FILL — SUMATRIPTAN SUCC 25 MG TAB: 25 | 25 days supply | Qty: 15 | Fill #0

## 2020-04-08 MED FILL — FREESTYLE LANCETS: 90 days supply | Qty: 300 | Fill #1

## 2020-04-08 MED FILL — FUROSEMIDE 20 MG TABS: 20 | 90 days supply | Qty: 90 | Fill #1

## 2020-05-09 MED FILL — FREESTYLE LITE TEST STRIP: 83 days supply | Qty: 250 | Fill #1

## 2020-05-12 MED FILL — LOSARTAN POTASSIUM 50 MG TA: 50 | 90 days supply | Qty: 90 | Fill #2

## 2020-05-23 MED FILL — JARDIANCE 10 MG TABLET: 10 | 90 days supply | Qty: 90 | Fill #1

## 2020-05-23 MED FILL — SUMATRIPTAN SUCC 25 MG TAB: 25 | 25 days supply | Qty: 15 | Fill #1

## 2020-05-31 MED FILL — JARDIANCE 10 MG TABLET: 10 | 90 days supply | Qty: 90 | Fill #1

## 2020-06-23 ENCOUNTER — Other Ambulatory Visit (HOSPITAL_COMMUNITY): Payer: Self-pay | Admitting: Internal Medicine

## 2020-06-23 DIAGNOSIS — E1169 Type 2 diabetes mellitus with other specified complication: Secondary | ICD-10-CM | POA: Diagnosis not present

## 2020-06-23 DIAGNOSIS — I10 Essential (primary) hypertension: Secondary | ICD-10-CM | POA: Diagnosis not present

## 2020-06-23 DIAGNOSIS — E7800 Pure hypercholesterolemia, unspecified: Secondary | ICD-10-CM | POA: Diagnosis not present

## 2020-06-23 DIAGNOSIS — H5203 Hypermetropia, bilateral: Secondary | ICD-10-CM | POA: Diagnosis not present

## 2020-06-23 DIAGNOSIS — Z1231 Encounter for screening mammogram for malignant neoplasm of breast: Secondary | ICD-10-CM | POA: Diagnosis not present

## 2020-06-23 MED FILL — LOSARTAN POTASSIUM 50 MG TA: 50 | 30 days supply | Qty: 30 | Fill #3

## 2020-06-23 MED FILL — JARDIANCE 25 MG TABLET: 25 | 90 days supply | Qty: 90 | Fill #0

## 2020-07-08 MED FILL — METFORMIN HCL ER 750 MG TAB: 750 | 90 days supply | Qty: 180 | Fill #1

## 2020-07-08 MED FILL — SUMATRIPTAN SUCC 25 MG TAB: 25 | 25 days supply | Qty: 15 | Fill #1

## 2020-07-08 MED FILL — FUROSEMIDE 20 MG TABS: 20 | 90 days supply | Qty: 90 | Fill #2

## 2020-07-22 DIAGNOSIS — L680 Hirsutism: Secondary | ICD-10-CM | POA: Diagnosis not present

## 2020-07-22 DIAGNOSIS — Z01419 Encounter for gynecological examination (general) (routine) without abnormal findings: Secondary | ICD-10-CM | POA: Diagnosis not present

## 2020-07-22 DIAGNOSIS — Z90710 Acquired absence of both cervix and uterus: Secondary | ICD-10-CM | POA: Diagnosis not present

## 2020-07-25 MED FILL — LOSARTAN POTASSIUM 50 MG TA: 50 | 60 days supply | Qty: 60 | Fill #4

## 2020-08-02 ENCOUNTER — Other Ambulatory Visit (HOSPITAL_COMMUNITY): Payer: Self-pay | Admitting: Internal Medicine

## 2020-08-02 MED FILL — MELOXICAM 15 MG TABLET: 15 | 90 days supply | Qty: 90 | Fill #1

## 2020-08-02 MED FILL — PENTIPS 31G X 6 MM MISC: 31G X 6 MM | 90 days supply | Qty: 100 | Fill #0

## 2020-09-13 DIAGNOSIS — I10 Essential (primary) hypertension: Secondary | ICD-10-CM | POA: Diagnosis not present

## 2020-09-13 DIAGNOSIS — E1165 Type 2 diabetes mellitus with hyperglycemia: Secondary | ICD-10-CM | POA: Diagnosis not present

## 2020-09-13 DIAGNOSIS — L680 Hirsutism: Secondary | ICD-10-CM | POA: Diagnosis not present

## 2020-10-03 ENCOUNTER — Telehealth: Payer: Self-pay | Admitting: Orthopedic Surgery

## 2020-10-03 NOTE — Telephone Encounter (Signed)
Pt needs to be seen for trigger finger and the only day she has off is her [birth year] which is [birth year]. Wondering if you could work her in?  [employer] [phone]

## 2020-10-04 NOTE — Telephone Encounter (Signed)
IC s/w patient and offered her an appointment to be seen on 02/21. Did advise that due to Dr Forbes Cellar very full schedule that day may have longer than normal wait time and that she was being worked in. Patient declined appt on that day. Offered other appt but patient states she will call us back to schedule at a time that is good for her.

## 2020-10-06 MED FILL — LOSARTAN POTASSIUM 50 MG TA: 50 | 30 days supply | Qty: 30 | Fill #0

## 2020-10-06 MED FILL — JARDIANCE 25 MG TABLET: 25 | 90 days supply | Qty: 90 | Fill #1

## 2020-10-11 ENCOUNTER — Other Ambulatory Visit: Payer: Self-pay | Admitting: Cardiovascular Disease

## 2020-10-12 ENCOUNTER — Other Ambulatory Visit: Payer: Self-pay | Admitting: Cardiovascular Disease

## 2020-10-12 MED FILL — FUROSEMIDE 20 MG TABS: 20 | 30 days supply | Qty: 30 | Fill #0

## 2020-10-17 ENCOUNTER — Other Ambulatory Visit: Payer: Self-pay

## 2020-10-17 DIAGNOSIS — I10 Essential (primary) hypertension: Secondary | ICD-10-CM

## 2020-10-17 MED FILL — SEMGLEE (YFGN) 100 UNIT/ML: 100 | 30 days supply | Qty: 3 | Fill #0

## 2020-10-17 NOTE — Progress Notes (Signed)
Per Dr. Johnsie Cancel okay to order Calcium score CT for patient.

## 2020-10-20 ENCOUNTER — Ambulatory Visit
Admission: RE | Admit: 2020-10-20 | Discharge: 2020-10-20 | Disposition: A | Discharge status: Home / Self Care | Payer: Self-pay | Source: Ambulatory Visit | Attending: Cardiovascular Disease | Admitting: Cardiovascular Disease

## 2020-10-20 ENCOUNTER — Other Ambulatory Visit: Payer: Self-pay

## 2020-10-20 DIAGNOSIS — I10 Essential (primary) hypertension: Secondary | ICD-10-CM

## 2020-10-27 ENCOUNTER — Other Ambulatory Visit: Payer: Self-pay

## 2020-10-27 ENCOUNTER — Telehealth: Payer: Self-pay

## 2020-10-27 ENCOUNTER — Other Ambulatory Visit: Payer: Self-pay | Admitting: Cardiovascular Disease

## 2020-10-27 DIAGNOSIS — E785 Hyperlipidemia, unspecified: Secondary | ICD-10-CM

## 2020-10-27 DIAGNOSIS — E119 Type 2 diabetes mellitus without complications: Secondary | ICD-10-CM

## 2020-10-27 DIAGNOSIS — I10 Essential (primary) hypertension: Secondary | ICD-10-CM

## 2020-10-27 DIAGNOSIS — R931 Abnormal findings on diagnostic imaging of heart and coronary circulation: Secondary | ICD-10-CM

## 2020-10-27 MED ORDER — ATORVASTATIN CALCIUM 10 MG PO TABS: 10.0000 mg | ORAL_TABLET | Freq: Every day | ORAL | 3 refills | Status: DC

## 2020-10-27 MED FILL — ATORVASTATIN CALCIUM 10 MG: 10 | 90 days supply | Qty: 90 | Fill #0

## 2020-10-27 NOTE — Telephone Encounter (Signed)
-----   Message from Belva Crome, MD sent at 10/27/2020 12:35 PM EST ----- Okay to get a lipid panel with liver ----- Message ----- From: Michaelyn Barter, RN Sent: 10/27/2020  11:36 AM EST To: Josue Hector, MD  The patient has been notified of the result and verbalized understanding.  All questions (if any) were answered. Ewell Poe [place], RN 10/27/2020 11:26 AM   Patient's last lipid panel was in 2020, will see if we can get an order for lipid panel. Lipitor 10 mg daily is currently on patient's medication list, but needs a refill. Will send in refill.

## 2020-10-27 NOTE — Progress Notes (Signed)
Okay to proceed with lipid panel

## 2020-10-27 NOTE — Telephone Encounter (Signed)
Patient needs refill on lipitor. Will send in refill.

## 2020-10-28 ENCOUNTER — Other Ambulatory Visit: Payer: Self-pay

## 2020-10-28 ENCOUNTER — Other Ambulatory Visit: Payer: 59 | Admitting: *Deleted

## 2020-10-28 DIAGNOSIS — E785 Hyperlipidemia, unspecified: Secondary | ICD-10-CM

## 2020-10-28 DIAGNOSIS — R931 Abnormal findings on diagnostic imaging of heart and coronary circulation: Secondary | ICD-10-CM | POA: Diagnosis not present

## 2020-10-28 DIAGNOSIS — I10 Essential (primary) hypertension: Secondary | ICD-10-CM | POA: Diagnosis not present

## 2020-10-28 LAB — LIPID PANEL
Chol/HDL Ratio: 4 ratio (ref 0.0–4.4)
Cholesterol, Total: 149 mg/dL (ref 100–199)
HDL: 37 mg/dL — ABNORMAL LOW (ref >39)
LDL Chol Calc (NIH): 93 mg/dL (ref 0–99)
Triglycerides: 103 mg/dL (ref 0–149)
VLDL Cholesterol Cal: 19 mg/dL (ref 5–40)

## 2020-10-28 LAB — HEPATIC FUNCTION PANEL
ALT: 10 IU/L (ref 0–32)
AST: 18 IU/L (ref 0–40)
Albumin: 4 g/dL (ref 3.8–4.8)
Alkaline Phosphatase: 99 IU/L (ref 44–121)
Bilirubin Total: 0.5 mg/dL (ref 0.0–1.2)
Bilirubin, Direct: 0.15 mg/dL (ref 0.00–0.40)
Total Protein: 7.3 g/dL (ref 6.0–8.5)

## 2020-10-31 ENCOUNTER — Telehealth: Payer: Self-pay

## 2020-10-31 DIAGNOSIS — E785 Hyperlipidemia, unspecified: Secondary | ICD-10-CM

## 2020-10-31 NOTE — Telephone Encounter (Signed)
-----   Message from Josue Hector, MD sent at 10/29/2020  2:52 PM EST ----- Started on lipitor 10 mg already f/u labs in 3 months

## 2020-10-31 NOTE — Telephone Encounter (Signed)
Placed order for lab work in 3 months.

## 2020-11-22 ENCOUNTER — Inpatient Hospital Stay: Admission: RE | Admit: 2020-11-22 | Payer: 59 | Source: Ambulatory Visit

## 2020-11-22 ENCOUNTER — Other Ambulatory Visit (HOSPITAL_COMMUNITY): Payer: Self-pay

## 2020-11-22 MED ORDER — METHOCARBAMOL 750 MG PO TABS
750.0000 mg | ORAL_TABLET | Freq: Three times a day (TID) | ORAL | 1 refills | Status: DC
  Filled 2020-11-22: qty 30, 10d supply, fill #0
  Filled 2021-05-05: qty 30, 10d supply, fill #1

## 2020-11-24 DIAGNOSIS — J0110 Acute frontal sinusitis, unspecified: Secondary | ICD-10-CM | POA: Diagnosis not present

## 2020-11-25 ENCOUNTER — Other Ambulatory Visit: Payer: Self-pay | Admitting: Cardiovascular Disease

## 2020-11-25 ENCOUNTER — Other Ambulatory Visit (HOSPITAL_COMMUNITY): Payer: Self-pay

## 2020-11-25 MED ORDER — FUROSEMIDE 20 MG PO TABS
20.0000 mg | ORAL_TABLET | Freq: Every day | ORAL | 0 refills | Status: DC
  Filled 2020-11-25 – 2020-12-06 (×2): qty 15, 15d supply, fill #0

## 2020-11-25 MED ORDER — AMOXICILLIN 875 MG PO TABS
875.0000 mg | ORAL_TABLET | Freq: Two times a day (BID) | ORAL | 0 refills | Status: AC
  Filled 2020-11-25: qty 14, 7d supply, fill #0

## 2020-11-25 MED FILL — Losartan Potassium Tab 50 MG: ORAL | 30 days supply | Qty: 30 | Fill #0 | Status: CN

## 2020-12-05 ENCOUNTER — Ambulatory Visit (INDEPENDENT_AMBULATORY_CARE_PROVIDER_SITE_OTHER): Payer: 59 | Admitting: Orthopedic Surgery

## 2020-12-05 DIAGNOSIS — M65341 Trigger finger, right ring finger: Secondary | ICD-10-CM | POA: Diagnosis not present

## 2020-12-06 ENCOUNTER — Other Ambulatory Visit (HOSPITAL_COMMUNITY): Payer: Self-pay

## 2020-12-06 MED FILL — Losartan Potassium Tab 50 MG: ORAL | 90 days supply | Qty: 90 | Fill #0 | Status: AC

## 2020-12-06 MED FILL — Sumatriptan Succinate Tab 25 MG: ORAL | 30 days supply | Qty: 15 | Fill #0 | Status: AC

## 2020-12-06 MED FILL — Meloxicam Tab 15 MG: ORAL | 90 days supply | Qty: 90 | Fill #0 | Status: AC

## 2020-12-07 ENCOUNTER — Other Ambulatory Visit (HOSPITAL_COMMUNITY): Payer: Self-pay

## 2020-12-09 ENCOUNTER — Telehealth: Payer: Self-pay

## 2020-12-09 ENCOUNTER — Other Ambulatory Visit (HOSPITAL_COMMUNITY): Payer: Self-pay

## 2020-12-09 MED FILL — Metformin HCl Tab ER 24HR 750 MG: ORAL | 4 days supply | Qty: 8 | Fill #0 | Status: AC

## 2020-12-09 MED FILL — Insulin Pen Needle 31 G X 6 MM (1/4" or 15/64"): 90 days supply | Qty: 100 | Fill #0 | Status: AC

## 2020-12-09 MED FILL — Metformin HCl Tab ER 24HR 750 MG: ORAL | 86 days supply | Qty: 172 | Fill #0 | Status: AC

## 2020-12-09 NOTE — Telephone Encounter (Signed)
Patient called she stated the best way for [name] to reach her would be her work number which [phone]

## 2020-12-10 ENCOUNTER — Encounter: Payer: Self-pay | Admitting: Orthopedic Surgery

## 2020-12-10 NOTE — Progress Notes (Signed)
Office Visit Note   Patient: Andrea Montes           Date of Birth: 1956-07-21           MRN: [id number] Visit Date: 12/05/2020 Requested by: Seward Carol, MD [address] [place],  [place] [address] PCP: Seward Carol, MD  Subjective: Chief Complaint  Patient presents with  . Right Hand - Numbness, Pain    Patient reports that she has pain, numbness and tingling in her hand most all the time. Worse in ring and pinky finger as they have limited movement.     HPI: Andrea Montes is a patient with right ring trigger finger.  She underwent right carpal tunnel release in 2019 and did well with that.  Has a history of ultrasound injection last clinic visit which helped some but the pain has recurred and the locking has recurred.  She works in a [employer].  She also reports some left shoulder pain.  She states that the arm feels heavy.  Her right hand ring finger is problematic at this time.              ROS: All systems reviewed are negative as they relate to the chief complaint within the history of present illness.  Patient denies  fevers or chills.   Assessment & Plan: Visit Diagnoses:  1. Trigger finger, right ring finger     Plan: Impression is right hand ring trigger finger with cyst in the A1 pulley.  Discussed the benefits and risk of surgery including will include to infection nerve vessel damage finger stiffness.  I think in general the cyst to be excised at the same time as the A1 pulley was released.  Patient would like to proceed with that likely on some Friday morning.  Regarding her shoulder I think that can be worked up after her finger is improved.  She does have some limitation of active forward flexion on that side.  Follow-Up Instructions: No follow-ups on file.   Orders:  No orders of the defined types were placed in this encounter.  No orders of the defined types were placed in this encounter.     Procedures: No procedures  performed   Clinical Data: No additional findings.  Objective: Vital Signs: There were no vitals taken for this visit.  Physical Exam:   Constitutional: Patient appears well-developed HEENT:  Head: Normocephalic Eyes:EOM are normal Neck: Normal range of motion Cardiovascular: Normal rate Pulmonary/chest: Effort normal Neurologic: Patient is alert Skin: Skin is warm Psychiatric: Patient has normal mood and affect    Ortho Exam: Ortho exam demonstrates full active and passive range of motion of the wrist on the right-hand side.  She does have a cyst within the A1 pulley right ring trigger finger.  Flexion extension of the fingers intact.  Radial pulse is intact.  Left shoulder is examined and she has external rotation passively to 60 abduction to 85 forward flexion actively to 110 but 170 passive.  Rotator cuff strength slightly weak on the left-hand side compared to the right.  Specialty Comments:  No specialty comments available.  Imaging: No results found.   PMFS History: Patient Active Problem List   Diagnosis Date Noted  . Type 2 diabetes mellitus without complication, without long-term current use of insulin ([place]) 10/01/2018  . Trigger finger, acquired 09/09/2015  . Low back pain radiating to both legs 05/06/2012  . Leg length discrepancy 05/06/2012  . HYPERTENSION, BENIGN 04/11/2009  .  LEG EDEMA, BILATERAL 10/08/2008  . 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 OVARIAN FAILURE 09/02/2007  . VITAMIN D DEFICIENCY 09/02/2007  . OBESITY, NOS 10/17/2006  . ANEMIA, IRON DEFICIENCY, UNSPEC. 10/17/2006  . Carpal tunnel syndrome, right upper limb 10/17/2006   Past Medical History:  Diagnosis Date  . Anemia    no current med.  [name] Apnea   . 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 Back pain   . Carpal tunnel syndrome   . Complication of anesthesia    itching from last surgery that was completed at the surgery center [address] patient was given benedryl, generalized itching and they are not sure where that came from  . DDD (degenerative disc disease), lumbar   . Dental crowns present   . Edema of lower extremity    bilateral  . Frozen shoulder    surgery 11-04-14  . GERD (gastroesophageal reflux disease)    no current med.  [name] GERD (gastroesophageal reflux disease)   . Heart murmur    last  echo- 2010  . History of blood transfusion   . History of shingles   . Hypertension    borderline not on medicaiton at this time  . Impingement syndrome of left shoulder region 10/2014  . Joint pain   . Migraines   . Obesity   . Pre-diabetes   . Rheumatoid arteritis ([place])   . Seasonal allergies   . Sleep apnea    uses CPAP nightly  . Trigger finger, left middle finger   . Vitamin D deficiency   . Wears partial dentures    upper    Family History  Problem Relation Age of Onset  . Kidney disease Brother   . Hypertension Brother   . Leukemia Mother   . Heart disease Mother   . Stroke Mother   . Cancer Mother   . Depression Mother   . Anxiety disorder Mother   . AAA (abdominal aortic aneurysm) Father   . Esophageal cancer Maternal Uncle   . Hypertension Brother   . Colon cancer Neg Hx   . Colon polyps Neg Hx   . Rectal cancer Neg Hx   . Stomach cancer Neg Hx     Past Surgical History:  Procedure Laterality Date  . CARPAL TUNNEL RELEASE Left 04/25/2006  . CARPAL TUNNEL RELEASE Right 07/15/2018   Procedure: RIGHT CARPAL TUNNEL RELEASE;  Surgeon: Meredith Pel, MD;  Location: [facility];  Service: Orthopedics;  Laterality: Right;  . CLOSED MANIPULATION SHOULDER WITH STERIOD INJECTION Left 02/03/2015   Procedure: LEFT  MANIPULATION SHOULDER UNDER ANESTHESIA WITH INJECTION OF STEROID;  Surgeon: Kathryne Hitch, MD;  Location: [facility];  Service: Orthopedics;  Laterality: Left;  . COLONOSCOPY    . COLONOSCOPY WITH PROPOFOL  10/09/2011  . POLYPECTOMY    . RESECTION DISTAL CLAVICAL Left  11/04/2014   Procedure: RESECTION DISTAL CLAVICAL;  Surgeon: Kathryne Hitch, MD;  Location: [facility];  Service: Orthopedics;  Laterality: Left;  . ROTATOR CUFF REPAIR  2010  . SHOULDER ARTHROSCOPY WITH ROTATOR CUFF REPAIR Right 10/04/2008  . SHOULDER ARTHROSCOPY WITH SUBACROMIAL DECOMPRESSION, ROTATOR CUFF REPAIR AND BICEP TENDON REPAIR Left 11/04/2014   Procedure: LEFT SHOULDER ARTHROSCOPY WITH DEBRIDEMENT, DISTAL CLAVICLE EXCISION, ACROMIOPLASTY, ROTATOR CUFF REPAIR AND BICEP TENODESIS;  Surgeon: Kathryne Hitch, MD;  Location: [facility];  Service: Orthopedics;  Laterality: Left;  . TRIGGER FINGER RELEASE Right 02/03/2015   Procedure:  RIGHT LONG FINGER TRIGGER RELEASE;  Surgeon: Kathryne Hitch, MD;  Location: [facility];  Service: Orthopedics;  Laterality: Right;  . TRIGGER FINGER RELEASE Left 10/10/2017   Procedure: LEFT HAND RELEASE TRIGGER FINGER 3RD LONG FINGER AND CYST EXCISION;  Surgeon: Meredith Pel, MD;  Location: [facility];  Service: Orthopedics;  Laterality: Left;  . TUBAL LIGATION    . VAGINAL HYSTERECTOMY  2001   partial   Social History   Occupational History  . Not on file  Tobacco Use  . Smoking status: Never Smoker  . Smokeless tobacco: Never Used  Vaping Use  . Vaping Use: Never used  Substance and Sexual Activity  . Alcohol use: No  . Drug use: No  . Sexual activity: Not on file

## 2020-12-12 ENCOUNTER — Other Ambulatory Visit (HOSPITAL_COMMUNITY): Payer: Self-pay

## 2021-01-11 ENCOUNTER — Other Ambulatory Visit: Payer: Self-pay | Admitting: *Deleted

## 2021-01-11 DIAGNOSIS — R931 Abnormal findings on diagnostic imaging of heart and coronary circulation: Secondary | ICD-10-CM

## 2021-01-11 DIAGNOSIS — E785 Hyperlipidemia, unspecified: Secondary | ICD-10-CM

## 2021-01-13 ENCOUNTER — Other Ambulatory Visit: Payer: 59

## 2021-01-26 ENCOUNTER — Telehealth: Payer: Self-pay | Admitting: Orthopedic Surgery

## 2021-01-26 NOTE — Telephone Encounter (Signed)
Patient/[employer] employee would like to schedule trigger finger surgery with Dr. Marlou Sa. Please provide surgery sheet.  She has chosen June 30th for a surgery date. Can we scheduled at [facility] as patient is diabetic, has sleep apnea, and BMI of 46+

## 2021-02-01 ENCOUNTER — Other Ambulatory Visit: Payer: Self-pay

## 2021-02-02 NOTE — Telephone Encounter (Signed)
Do you have this 1?

## 2021-02-09 ENCOUNTER — Telehealth: Payer: Self-pay

## 2021-02-09 ENCOUNTER — Other Ambulatory Visit (HOSPITAL_COMMUNITY): Payer: Self-pay

## 2021-02-09 MED ORDER — NYSTATIN 100000 UNIT/ML MT SUSP
15.0000 mL | Freq: Four times a day (QID) | OROMUCOSAL | 1 refills | Status: DC
  Filled 2021-02-09: qty 473, 8d supply, fill #0

## 2021-02-09 NOTE — Telephone Encounter (Signed)
IC advised done. She will try to access through [place]'

## 2021-02-09 NOTE — Telephone Encounter (Signed)
Patient came into the office she is requesting a note for her job that states she will be out of work and will be having surgery 6/30 and her return back to work date will be determined when she comes in for her post op visit 02/24/2021 call [phone]

## 2021-02-09 NOTE — Telephone Encounter (Signed)
Patient is requesting the note to be emailed to her today [email or web address]

## 2021-02-10 ENCOUNTER — Telehealth: Payer: Self-pay | Admitting: *Deleted

## 2021-02-10 ENCOUNTER — Other Ambulatory Visit: Payer: Self-pay | Admitting: Cardiovascular Disease

## 2021-02-10 ENCOUNTER — Other Ambulatory Visit (HOSPITAL_COMMUNITY): Payer: Self-pay

## 2021-02-10 MED ORDER — FUROSEMIDE 20 MG PO TABS
20.0000 mg | ORAL_TABLET | Freq: Every day | ORAL | 0 refills | Status: DC
  Filled 2021-02-10: qty 30, 30d supply, fill #0

## 2021-02-10 MED ORDER — CARESTART COVID-19 HOME TEST VI KIT
PACK | 0 refills | Status: DC
  Filled 2021-02-10: qty 4, 4d supply, fill #0

## 2021-02-10 MED ORDER — FUROSEMIDE 20 MG PO TABS
20.0000 mg | ORAL_TABLET | Freq: Every day | ORAL | 0 refills | Status: DC
  Filled 2021-02-10: qty 15, 15d supply, fill #0

## 2021-02-10 MED FILL — Atorvastatin Calcium Tab 10 MG (Base Equivalent): ORAL | 90 days supply | Qty: 90 | Fill #0 | Status: AC

## 2021-02-10 MED FILL — Empagliflozin Tab 25 MG: ORAL | 30 days supply | Qty: 30 | Fill #0 | Status: AC

## 2021-02-10 NOTE — Telephone Encounter (Signed)
Patient needs refill on Lasix. She is over due for an office visit. Made patient an office visit with Dr. Johnsie Cancel next month and sent in month supply of Lasix until she comes in for office visit.

## 2021-02-10 NOTE — Telephone Encounter (Signed)
Patient requesting Furosemide 20 mg. Last office visit 08/11/18. Please advise. Thank you

## 2021-02-15 ENCOUNTER — Encounter (HOSPITAL_COMMUNITY): Payer: Self-pay | Admitting: Orthopedic Surgery

## 2021-02-15 NOTE — Progress Notes (Addendum)
PCP - Seward Carol Cardiologist - [place]  PPM/ICD - denies Device Orders -  Rep Notified -   Chest x-ray - 07/14/17 EKG - DOS  Stress Test -10/09/01  ECHO - 11/23/2008 Cardiac Cath - denies  Sleep Study - 11/15/2011 CPAP - yes  Fasting Blood Sugar - 106 Checks Blood Sugar _____ times a day  Blood Thinner Instructions:n/a Aspirin Instructions: no Goody's powders before surgery.  ERAS Protcol - yes-clear liquids until 0915 PRE-SURGERY Ensure or G2- no  Do not take Jardiance or Metformin morning of surgery. Take 50% of usual dose of Glargine insulin-5 units- the night before surgery.  Check blood sugar when you get up and every 2 hours until you arrive at hospital. If blood sugar is less than 70,drink 1/2 cup of clear juice. Recheck blood sugar in 15 minutes. If it is still less than 70,call [phone] for further instructions.   COVID TEST- n/a -ambulatory surgery   Anesthesia review: no     All instructions explained to the patient, with a verbal understanding of the material. Patient agrees to go over the instructions while at home for a better understanding.  The opportunity to ask questions was provided.

## 2021-02-16 ENCOUNTER — Ambulatory Visit (HOSPITAL_COMMUNITY)
Admission: RE | Admit: 2021-02-16 | Discharge: 2021-02-16 | Disposition: A | Discharge status: Home / Self Care | Payer: 59 | Attending: Orthopedic Surgery | Admitting: Orthopedic Surgery

## 2021-02-16 ENCOUNTER — Other Ambulatory Visit (HOSPITAL_COMMUNITY): Payer: Self-pay

## 2021-02-16 ENCOUNTER — Ambulatory Visit (HOSPITAL_COMMUNITY): Payer: 59 | Admitting: Anesthesiology

## 2021-02-16 ENCOUNTER — Encounter (HOSPITAL_COMMUNITY)
Admission: RE | Disposition: A | Discharge status: Home / Self Care | Payer: Self-pay | Source: Home / Self Care | Attending: Orthopedic Surgery

## 2021-02-16 ENCOUNTER — Other Ambulatory Visit: Payer: Self-pay

## 2021-02-16 ENCOUNTER — Encounter (HOSPITAL_COMMUNITY): Payer: Self-pay | Admitting: Orthopedic Surgery

## 2021-02-16 DIAGNOSIS — Z79899 Other long term (current) drug therapy: Secondary | ICD-10-CM | POA: Diagnosis not present

## 2021-02-16 DIAGNOSIS — Z7982 Long term (current) use of aspirin: Secondary | ICD-10-CM | POA: Diagnosis not present

## 2021-02-16 DIAGNOSIS — E559 Vitamin D deficiency, unspecified: Secondary | ICD-10-CM | POA: Diagnosis not present

## 2021-02-16 DIAGNOSIS — Z791 Long term (current) use of non-steroidal anti-inflammatories (NSAID): Secondary | ICD-10-CM | POA: Diagnosis not present

## 2021-02-16 DIAGNOSIS — Z7984 Long term (current) use of oral hypoglycemic drugs: Secondary | ICD-10-CM | POA: Insufficient documentation

## 2021-02-16 DIAGNOSIS — D509 Iron deficiency anemia, unspecified: Secondary | ICD-10-CM | POA: Diagnosis not present

## 2021-02-16 DIAGNOSIS — M069 Rheumatoid arthritis, unspecified: Secondary | ICD-10-CM | POA: Insufficient documentation

## 2021-02-16 DIAGNOSIS — M67441 Ganglion, right hand: Secondary | ICD-10-CM | POA: Insufficient documentation

## 2021-02-16 DIAGNOSIS — Z888 Allergy status to other drugs, medicaments and biological substances status: Secondary | ICD-10-CM | POA: Diagnosis not present

## 2021-02-16 DIAGNOSIS — Z823 Family history of stroke: Secondary | ICD-10-CM | POA: Insufficient documentation

## 2021-02-16 DIAGNOSIS — Z806 Family history of leukemia: Secondary | ICD-10-CM | POA: Insufficient documentation

## 2021-02-16 DIAGNOSIS — M65341 Trigger finger, right ring finger: Secondary | ICD-10-CM | POA: Diagnosis not present

## 2021-02-16 DIAGNOSIS — Z8249 Family history of ischemic heart disease and other diseases of the circulatory system: Secondary | ICD-10-CM | POA: Diagnosis not present

## 2021-02-16 DIAGNOSIS — Z841 Family history of disorders of kidney and ureter: Secondary | ICD-10-CM | POA: Insufficient documentation

## 2021-02-16 DIAGNOSIS — Z800 Family history of malignant neoplasm of digestive organs: Secondary | ICD-10-CM | POA: Diagnosis not present

## 2021-02-16 DIAGNOSIS — Z794 Long term (current) use of insulin: Secondary | ICD-10-CM | POA: Insufficient documentation

## 2021-02-16 DIAGNOSIS — I10 Essential (primary) hypertension: Secondary | ICD-10-CM | POA: Diagnosis not present

## 2021-02-16 DIAGNOSIS — E119 Type 2 diabetes mellitus without complications: Secondary | ICD-10-CM | POA: Diagnosis not present

## 2021-02-16 HISTORY — PX: TRIGGER FINGER RELEASE: SHX641

## 2021-02-16 LAB — SURGICAL PCR SCREEN
MRSA, PCR: NEGATIVE
Staphylococcus aureus: NEGATIVE

## 2021-02-16 LAB — CBC
HCT: 42.7 % (ref 36.0–46.0)
Hemoglobin: 12.7 g/dL (ref 12.0–15.0)
MCH: 20.7 pg — ABNORMAL LOW (ref 26.0–34.0)
MCHC: 29.7 g/dL — ABNORMAL LOW (ref 30.0–36.0)
MCV: 69.5 fL — ABNORMAL LOW (ref 80.0–100.0)
Platelets: 200 10*3/uL (ref 150–400)
RBC: 6.14 MIL/uL — ABNORMAL HIGH (ref 3.87–5.11)
RDW: 17.2 % — ABNORMAL HIGH (ref 11.5–15.5)
WBC: 8.3 10*3/uL (ref 4.0–10.5)
nRBC: 0 % (ref 0.0–0.2)

## 2021-02-16 LAB — BASIC METABOLIC PANEL
Anion gap: 10 (ref 5–15)
BUN: 15 mg/dL (ref 8–23)
CO2: 23 mmol/L (ref 22–32)
Calcium: 9 mg/dL (ref 8.9–10.3)
Chloride: 103 mmol/L (ref 98–111)
Creatinine, Ser: 0.79 mg/dL (ref 0.44–1.00)
GFR, Estimated: >60 mL/min (ref >60)
Glucose, Bld: 100 mg/dL — ABNORMAL HIGH (ref 70–99)
Potassium: 4.1 mmol/L (ref 3.5–5.1)
Sodium: 136 mmol/L (ref 135–145)

## 2021-02-16 LAB — GLUCOSE, CAPILLARY
Glucose-Capillary: 125 mg/dL — ABNORMAL HIGH (ref 70–99)
Glucose-Capillary: 98 mg/dL (ref 70–99)

## 2021-02-16 SURGERY — RELEASE, A1 PULLEY, FOR TRIGGER FINGER
Anesthesia: General | Site: Finger | Laterality: Right

## 2021-02-16 MED ORDER — POVIDONE-IODINE 10 % EX SWAB: 2.0000 "application " | Freq: Once | CUTANEOUS | Status: DC

## 2021-02-16 MED ORDER — LIDOCAINE 2% (20 MG/ML) 5 ML SYRINGE
INTRAMUSCULAR | Status: DC | PRN
  Administered 2021-02-16: 100 mg via INTRAVENOUS

## 2021-02-16 MED ORDER — PROPOFOL 10 MG/ML IV BOLUS
INTRAVENOUS | Status: DC | PRN
  Administered 2021-02-16: 150 mg via INTRAVENOUS

## 2021-02-16 MED ORDER — FENTANYL CITRATE (PF) 100 MCG/2ML IJ SOLN
25.0000 ug | INTRAMUSCULAR | Status: DC | PRN
  Administered 2021-02-16 (×2): 50 ug via INTRAVENOUS

## 2021-02-16 MED ORDER — TRAMADOL HCL 50 MG PO TABS
50.0000 mg | ORAL_TABLET | Freq: Once | ORAL | Status: AC
  Administered 2021-02-16: 50 mg via ORAL

## 2021-02-16 MED ORDER — ORAL CARE MOUTH RINSE: 15.0000 mL | Freq: Once | OROMUCOSAL | Status: AC

## 2021-02-16 MED ORDER — PHENYLEPHRINE 40 MCG/ML (10ML) SYRINGE FOR IV PUSH (FOR BLOOD PRESSURE SUPPORT)
PREFILLED_SYRINGE | INTRAVENOUS | Status: AC
  Filled 2021-02-16: qty 10

## 2021-02-16 MED ORDER — BUPIVACAINE HCL (PF) 0.25 % IJ SOLN
INTRAMUSCULAR | Status: AC
  Filled 2021-02-16: qty 20

## 2021-02-16 MED ORDER — BUPIVACAINE HCL (PF) 0.25 % IJ SOLN
INTRAMUSCULAR | Status: DC | PRN
  Administered 2021-02-16: 10 mL

## 2021-02-16 MED ORDER — PHENYLEPHRINE 40 MCG/ML (10ML) SYRINGE FOR IV PUSH (FOR BLOOD PRESSURE SUPPORT)
PREFILLED_SYRINGE | INTRAVENOUS | Status: AC
  Filled 2021-02-16: qty 30

## 2021-02-16 MED ORDER — MIDAZOLAM HCL 2 MG/2ML IJ SOLN
INTRAMUSCULAR | Status: AC
  Filled 2021-02-16: qty 2

## 2021-02-16 MED ORDER — PHENYLEPHRINE 40 MCG/ML (10ML) SYRINGE FOR IV PUSH (FOR BLOOD PRESSURE SUPPORT)
PREFILLED_SYRINGE | INTRAVENOUS | Status: DC | PRN
  Administered 2021-02-16 (×2): 80 ug via INTRAVENOUS
  Administered 2021-02-16: 100 ug via INTRAVENOUS
  Administered 2021-02-16: 80 ug via INTRAVENOUS
  Administered 2021-02-16: 100 ug via INTRAVENOUS

## 2021-02-16 MED ORDER — CEFAZOLIN SODIUM-DEXTROSE 2-4 GM/100ML-% IV SOLN
2.0000 g | INTRAVENOUS | Status: AC
  Administered 2021-02-16: 2 g via INTRAVENOUS
  Filled 2021-02-16: qty 100

## 2021-02-16 MED ORDER — TRAMADOL HCL 50 MG PO TABS
50.0000 mg | ORAL_TABLET | Freq: Four times a day (QID) | ORAL | 0 refills | Status: AC | PRN
  Filled 2021-02-16: qty 30, 8d supply, fill #0

## 2021-02-16 MED ORDER — PROPOFOL 10 MG/ML IV BOLUS
INTRAVENOUS | Status: AC
  Filled 2021-02-16: qty 20

## 2021-02-16 MED ORDER — EPHEDRINE 5 MG/ML INJ
INTRAVENOUS | Status: AC
  Filled 2021-02-16: qty 10

## 2021-02-16 MED ORDER — FENTANYL CITRATE (PF) 100 MCG/2ML IJ SOLN
INTRAMUSCULAR | Status: DC | PRN
  Administered 2021-02-16: 50 ug via INTRAVENOUS

## 2021-02-16 MED ORDER — ACETAMINOPHEN 500 MG PO TABS: 1000.0000 mg | ORAL_TABLET | Freq: Once | ORAL | Status: DC

## 2021-02-16 MED ORDER — ONDANSETRON HCL 4 MG/2ML IJ SOLN
INTRAMUSCULAR | Status: AC
  Filled 2021-02-16: qty 2

## 2021-02-16 MED ORDER — CHLORHEXIDINE GLUCONATE 0.12 % MT SOLN
15.0000 mL | Freq: Once | OROMUCOSAL | Status: AC
  Administered 2021-02-16: 15 mL via OROMUCOSAL
  Filled 2021-02-16: qty 15

## 2021-02-16 MED ORDER — FENTANYL CITRATE (PF) 100 MCG/2ML IJ SOLN
INTRAMUSCULAR | Status: AC
  Filled 2021-02-16: qty 2

## 2021-02-16 MED ORDER — TRAMADOL HCL 50 MG PO TABS
ORAL_TABLET | ORAL | Status: AC
  Filled 2021-02-16: qty 1

## 2021-02-16 MED ORDER — MIDAZOLAM HCL 5 MG/5ML IJ SOLN
INTRAMUSCULAR | Status: DC | PRN
  Administered 2021-02-16: 2 mg via INTRAVENOUS

## 2021-02-16 MED ORDER — LIDOCAINE 2% (20 MG/ML) 5 ML SYRINGE
INTRAMUSCULAR | Status: AC
  Filled 2021-02-16: qty 5

## 2021-02-16 MED ORDER — POVIDONE-IODINE 7.5 % EX SOLN
Freq: Once | CUTANEOUS | Status: DC
  Filled 2021-02-16: qty 118

## 2021-02-16 MED ORDER — FENTANYL CITRATE (PF) 250 MCG/5ML IJ SOLN
INTRAMUSCULAR | Status: AC
  Filled 2021-02-16: qty 5

## 2021-02-16 MED ORDER — ONDANSETRON HCL 4 MG/2ML IJ SOLN
INTRAMUSCULAR | Status: DC | PRN
  Administered 2021-02-16: 4 mg via INTRAVENOUS

## 2021-02-16 MED ORDER — 0.9 % SODIUM CHLORIDE (POUR BTL) OPTIME
TOPICAL | Status: DC | PRN
  Administered 2021-02-16: 1000 mL

## 2021-02-16 MED ORDER — EPHEDRINE SULFATE-NACL 50-0.9 MG/10ML-% IV SOSY
PREFILLED_SYRINGE | INTRAVENOUS | Status: DC | PRN
  Administered 2021-02-16: 10 mg via INTRAVENOUS

## 2021-02-16 MED ORDER — DEXAMETHASONE SODIUM PHOSPHATE 4 MG/ML IJ SOLN
INTRAMUSCULAR | Status: DC | PRN
  Administered 2021-02-16: 5 mg via INTRAVENOUS

## 2021-02-16 MED ORDER — DEXAMETHASONE SODIUM PHOSPHATE 10 MG/ML IJ SOLN
INTRAMUSCULAR | Status: AC
  Filled 2021-02-16: qty 1

## 2021-02-16 MED ORDER — LACTATED RINGERS IV SOLN: INTRAVENOUS | Status: DC

## 2021-02-16 SURGICAL SUPPLY — 49 items
BAG COUNTER SPONGE SURGICOUNT (BAG) ×2 IMPLANT
BAG SPNG CNTER NS LX DISP (BAG) ×1
BNDG CMPR 9X4 STRL LF SNTH (GAUZE/BANDAGES/DRESSINGS)
BNDG ELASTIC 2X5.8 VLCR STR LF (GAUZE/BANDAGES/DRESSINGS) ×1 IMPLANT
BNDG ELASTIC 3X5.8 VLCR STR LF (GAUZE/BANDAGES/DRESSINGS) ×4 IMPLANT
BNDG ELASTIC 4X5.8 VLCR STR LF (GAUZE/BANDAGES/DRESSINGS) ×2 IMPLANT
BNDG ESMARK 4X9 LF (GAUZE/BANDAGES/DRESSINGS) IMPLANT
BNDG GAUZE ELAST 4 BULKY (GAUZE/BANDAGES/DRESSINGS) ×2 IMPLANT
CORD BIPOLAR FORCEPS 12FT (ELECTRODE) ×2 IMPLANT
COVER SURGICAL LIGHT HANDLE (MISCELLANEOUS) ×2 IMPLANT
CUFF TOURN SGL QUICK 18X4 (TOURNIQUET CUFF) ×2 IMPLANT
CUFF TOURN SGL QUICK 24 (TOURNIQUET CUFF)
CUFF TRNQT CYL 24X4X16.5-23 (TOURNIQUET CUFF) IMPLANT
DRAPE SURG 17X23 STRL (DRAPES) ×2 IMPLANT
DRSG TEGADERM 2-3/8X2-3/4 SM (GAUZE/BANDAGES/DRESSINGS) ×1 IMPLANT
DURAPREP 26ML APPLICATOR (WOUND CARE) ×2 IMPLANT
GAUZE SPONGE 4X4 12PLY STRL (GAUZE/BANDAGES/DRESSINGS) ×2 IMPLANT
GAUZE SPONGE 4X4 12PLY STRL LF (GAUZE/BANDAGES/DRESSINGS) ×1 IMPLANT
GAUZE XEROFORM 1X8 LF (GAUZE/BANDAGES/DRESSINGS) ×2 IMPLANT
GLOVE SRG 8 PF TXTR STRL LF DI (GLOVE) ×1 IMPLANT
GLOVE SURG LTX SZ8 (GLOVE) ×2 IMPLANT
GLOVE SURG UNDER POLY LF SZ8 (GLOVE) ×2
GOWN STRL REUS W/ TWL LRG LVL3 (GOWN DISPOSABLE) ×2 IMPLANT
GOWN STRL REUS W/ TWL XL LVL3 (GOWN DISPOSABLE) ×1 IMPLANT
GOWN STRL REUS W/TWL LRG LVL3 (GOWN DISPOSABLE) ×4
GOWN STRL REUS W/TWL XL LVL3 (GOWN DISPOSABLE) ×2
KIT BASIN OR (CUSTOM PROCEDURE TRAY) ×2 IMPLANT
KIT TURNOVER KIT B (KITS) ×2 IMPLANT
LOOP VESSEL MAXI BLUE (MISCELLANEOUS) IMPLANT
NDL HYPO 25GX1X1/2 BEV (NEEDLE) IMPLANT
NEEDLE HYPO 25GX1X1/2 BEV (NEEDLE) IMPLANT
NS IRRIG 1000ML POUR BTL (IV SOLUTION) ×2 IMPLANT
PACK ORTHO EXTREMITY (CUSTOM PROCEDURE TRAY) ×2 IMPLANT
PAD ARMBOARD 7.5X6 YLW CONV (MISCELLANEOUS) ×4 IMPLANT
PAD CAST 4YDX4 CTTN HI CHSV (CAST SUPPLIES) ×2 IMPLANT
PADDING CAST COTTON 4X4 STRL (CAST SUPPLIES) ×4
SUCTION FRAZIER HANDLE 10FR (MISCELLANEOUS)
SUCTION TUBE FRAZIER 10FR DISP (MISCELLANEOUS) IMPLANT
SUT ETHILON 3 0 PS 1 (SUTURE) ×2 IMPLANT
SUT VIC AB 2-0 CT1 27 (SUTURE)
SUT VIC AB 2-0 CT1 TAPERPNT 27 (SUTURE) IMPLANT
SUT VIC AB 3-0 FS2 27 (SUTURE) IMPLANT
SYR CONTROL 10ML LL (SYRINGE) ×1 IMPLANT
SYSTEM CHEST DRAIN TLS 7FR (DRAIN) IMPLANT
TOWEL GREEN STERILE (TOWEL DISPOSABLE) ×2 IMPLANT
TOWEL GREEN STERILE FF (TOWEL DISPOSABLE) ×2 IMPLANT
TUBE CONNECTING 12X1/4 (SUCTIONS) IMPLANT
UNDERPAD 30X36 HEAVY ABSORB (UNDERPADS AND DIAPERS) ×2 IMPLANT
WATER STERILE IRR 1000ML POUR (IV SOLUTION) ×2 IMPLANT

## 2021-02-16 NOTE — Transfer of Care (Signed)
Immediate Anesthesia Transfer of Care Note  Patient: CALEYAH JR  Procedure(s) Performed: TRIGGER FINGER RELEASE RIGHT RING FINGER (Right: Finger)  Patient Location: PACU  Anesthesia Type:General  Level of Consciousness: awake, alert , patient cooperative and responds to stimulation  Airway & Oxygen Therapy: Patient Spontanous Breathing and Patient connected to nasal cannula oxygen  Post-op Assessment: Report given to RN, Post -op Vital signs reviewed and stable and Patient moving all extremities X 4  Post vital signs: Reviewed and stable  Last Vitals:  Vitals Value Taken Time  BP 124/85 02/16/21 1348  Temp    Pulse 83 02/16/21 1349  Resp 22 02/16/21 1349  SpO2 100 % 02/16/21 1349  Vitals shown include unvalidated device data.  Last Pain:  Vitals:   02/16/21 1012  PainSc: 4          Complications: No notable events documented.

## 2021-02-16 NOTE — Anesthesia Procedure Notes (Signed)
Procedure Name: LMA Insertion Date/Time: 02/16/2021 1:04 PM Performed by: Michele Rockers, CRNA Pre-anesthesia Checklist: Patient identified, Patient being monitored, Timeout performed, Emergency Drugs available and Suction available Patient Re-evaluated:Patient Re-evaluated prior to induction Oxygen Delivery Method: Circle system utilized Preoxygenation: Pre-oxygenation with 100% oxygen Induction Type: IV induction Ventilation: Mask ventilation without difficulty Number of attempts: 1 Placement Confirmation: positive ETCO2 and breath sounds checked- equal and bilateral Tube secured with: Tape Dental Injury: Teeth and Oropharynx as per pre-operative assessment

## 2021-02-16 NOTE — H&P (Addendum)
Andrea Montes is an 65 y.o. female.   Chief Complaint: Right trigger finger HPI:  Andrea Montes is a patient with right ring trigger finger.  She underwent right carpal tunnel release in 2019 and did well with that.  Has a history of ultrasound injection last clinic visit which helped some but the pain has recurred and the locking has recurred.  She works in a Camera operator.  She also reports some left shoulder pain.  She states that the arm feels heavy.  Her right hand ring finger is problematic at this time  Past Medical History:  Diagnosis Date   Anemia    no current med.   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   GERD (gastroesophageal reflux disease)    Heart murmur    last  echo- 2010   History of blood transfusion    History of shingles    Hypertension    borderline not on medicaiton at this time   Impingement syndrome of left shoulder region 10/2014   Joint pain    Migraines    Obesity    Pre-diabetes    Rheumatoid arteritis (HCC)    Seasonal allergies    Sleep apnea    uses CPAP nightly   Trigger finger, left middle finger    Vitamin D deficiency    Wears partial dentures    upper    Past Surgical History:  Procedure Laterality Date   CARPAL TUNNEL RELEASE Left 04/25/2006   CARPAL TUNNEL RELEASE Right 07/15/2018   Procedure: RIGHT CARPAL TUNNEL RELEASE;  Surgeon: Meredith Pel, MD;  Location: [place];  Service: Orthopedics;  Laterality: Right;   CLOSED MANIPULATION SHOULDER WITH STERIOD  INJECTION Left 02/03/2015   Procedure: LEFT  MANIPULATION SHOULDER UNDER ANESTHESIA WITH INJECTION OF STEROID;  Surgeon: Kathryne Hitch, MD;  Location: [place];  Service: Orthopedics;  Laterality: Left;   COLONOSCOPY     COLONOSCOPY WITH PROPOFOL  10/09/2011   POLYPECTOMY     RESECTION DISTAL CLAVICAL Left 11/04/2014   Procedure: RESECTION DISTAL CLAVICAL;  Surgeon: Kathryne Hitch, MD;  Location: [place];  Service: Orthopedics;  Laterality: Left;   ROTATOR CUFF REPAIR  2010   SHOULDER ARTHROSCOPY WITH ROTATOR CUFF REPAIR Right 10/04/2008   SHOULDER ARTHROSCOPY WITH SUBACROMIAL DECOMPRESSION, ROTATOR CUFF REPAIR AND BICEP TENDON REPAIR Left 11/04/2014   Procedure: LEFT SHOULDER ARTHROSCOPY WITH DEBRIDEMENT, DISTAL CLAVICLE EXCISION, ACROMIOPLASTY, ROTATOR CUFF REPAIR AND BICEP TENODESIS;  Surgeon: Kathryne Hitch, MD;  Location: [place];  Service: Orthopedics;  Laterality: Left;   TRIGGER FINGER RELEASE Right 02/03/2015   Procedure: RIGHT LONG FINGER TRIGGER RELEASE;  Surgeon: Kathryne Hitch, MD;  Location: [place];  Service: Orthopedics;  Laterality: Right;   TRIGGER FINGER RELEASE Left 10/10/2017   Procedure: LEFT HAND RELEASE TRIGGER FINGER 3RD LONG FINGER AND CYST EXCISION;  Surgeon: Meredith Pel, MD;  Location: [facility];  Service: Orthopedics;  Laterality: Left;   TUBAL LIGATION     VAGINAL HYSTERECTOMY  2001   partial    Family History  Problem Relation Age of Onset   Kidney disease Brother    Hypertension Brother    Leukemia Mother    Heart disease Mother    Stroke Mother    Cancer Mother    Depression Mother    Anxiety disorder Mother    AAA (abdominal aortic aneurysm) Father    Esophageal cancer Maternal Uncle    Hypertension Brother    Colon cancer Neg Hx    Colon polyps Neg Hx    Rectal cancer Neg Hx    Stomach cancer Neg Hx    Social History:  reports that she has never smoked. She has never used  smokeless tobacco. She reports that she does not drink alcohol and does not use drugs.  Allergies:  Allergies  Allergen Reactions   Lisinopril Shortness Of Breath and Other (See Comments)    wheezing   Kiwi Extract Itching and Other (See Comments)    Itching in mouth with KIWI, And pineapple juice- topically irritating     Facility-Administered Medications Prior to Admission  Medication Dose Route Frequency Provider Last Rate Last Admin   0.9 %  sodium chloride infusion  500 mL Intravenous Continuous Nandigam, Kavitha V, MD       Medications Prior to Admission  Medication Sig Dispense Refill   Aspirin-Acetaminophen-Caffeine (GOODY HEADACHE PO) Take 1 Package by mouth as needed (pain).     atorvastatin (LIPITOR) 10 MG tablet TAKE 1 TABLET (10 MG TOTAL) BY MOUTH AT BEDTIME. (Patient taking differently: Take 10 mg by mouth at bedtime.) 90 tablet 3   calcium carbonate (TUMS EX) 750 MG chewable tablet Chew 1-2 tablets by mouth 2 (two) times daily as needed for heartburn.     Cholecalciferol (VITAMIN D3) 50 MCG (2000 UT) TABS Take 2,000 Units by mouth every Friday.     empagliflozin (JARDIANCE) 25 MG TABS tablet Take 1 tablet (25 mg total) by mouth daily. (Patient taking differently: Take 25 mg by mouth in the morning.) 90 tablet 3   furosemide (LASIX) 20 MG tablet Take 1 tablet (20 mg total) by mouth daily. 30 tablet 0   Insulin Glargine-yfgn 100 UNIT/ML SOPN INJECT 10 UNITS ONCE A DAY AT BEDTIME SUBCUTANEOUSLY (Patient taking differently: Inject 10 Units into the skin at bedtime.) 15 mL 5   losartan (COZAAR) 50 MG tablet Take 1 tablet (50 mg total) by mouth daily. (Patient taking differently: Take 50 mg by mouth in the morning.) 90 tablet 3   meloxicam (MOBIC) 15 MG tablet TAKE 1 TABLET BY MOUTH ONCE DAILY (Patient taking differently: Take 15 mg by mouth in the morning.) 90 tablet 3   metFORMIN (GLUCOPHAGE-XR) 750 MG 24 hr tablet TAKE 2 TABLETS BY MOUTH DAILY WTIH FOOD. (Patient taking  differently: Take 750 mg by mouth in the morning and at bedtime.) 180 tablet 3   methocarbamol (ROBAXIN) 750 MG tablet Take 1 tablet (750 mg total) by mouth every 8 hours as needed (Patient taking differently: Take 750 mg by mouth 3 (three) times daily as needed for muscle spasms.) 30 tablet 1   Multiple Vitamin (MULTIVITAMIN WITH MINERALS) TABS tablet Take 1 tablet by mouth every other day. In the morning     SUMAtriptan (IMITREX) 25 MG tablet TAKE 1 TABLET BY MOUTH AT ONSET OF MIGRAINE, MAY REPEAT IN 1 HR IF NEEDED (Patient taking differently:  Take 25 mg by mouth every 2 (two) hours as needed for migraine.) 15 tablet 5   Blood Pressure Monitoring (OMRON 3 SERIES BP MONITOR) DEVI      COVID-19 At Home Antigen Test (CARESTART COVID-19 HOME TEST) KIT Use as directed within package instructions. 4 each 0   FREESTYLE LITE test strip      Insulin Pen Needle 31G X 6 MM MISC USE AS DIRECTED ONCE A DAY 100 each 3   Lancets (FREESTYLE) lancets 1 each 3 (three) times daily.     losartan (COZAAR) 50 MG tablet TAKE 1 TABLET BY MOUTH ONCE A DAY (Patient not taking: Reported on 02/10/2021) 90 tablet 3   meloxicam (MOBIC) 15 MG tablet Take 1 tablet (15 mg total) by mouth daily as needed for pain. (Patient not taking: Reported on 02/10/2021) 60 tablet 1   nystatin (MYCOSTATIN) 100000 UNIT/ML suspension Swish 15 mLs (1 tablespoon) by mouth 4 times daily then expectorate. (Patient not taking: Reported on 02/10/2021) 473 mL 1   traMADol (ULTRAM) 50 MG tablet Take 1 tablet (50 mg total) by mouth every 6 (six) hours as needed. (Patient taking differently: Take 50 mg by mouth every 6 (six) hours as needed (pain).) 60 tablet 2    Results for orders placed or performed during the hospital encounter of 02/16/21 (from the past 48 hour(s))  CBC     Status: Abnormal   Collection Time: 02/16/21  9:36 AM  Result Value Ref Range   WBC 8.3 4.0 - 10.5 K/uL   RBC 6.14 (H) 3.87 - 5.11 MIL/uL   Hemoglobin 12.7 12.0 - 15.0 g/dL    HCT 42.7 36.0 - 46.0 %   MCV 69.5 (L) 80.0 - 100.0 fL   MCH 20.7 (L) 26.0 - 34.0 pg   MCHC 29.7 (L) 30.0 - 36.0 g/dL   RDW 17.2 (H) 11.5 - 15.5 %   Platelets 200 150 - 400 K/uL    Comment: SPECIMEN CHECKED FOR CLOTS REPEATED TO VERIFY    nRBC 0.0 0.0 - 0.2 %    Comment: Performed at [facility], [place] [address], [place], [place] [address]  Basic metabolic panel     Status: Abnormal   Collection Time: 02/16/21  9:36 AM  Result Value Ref Range   Sodium 136 135 - 145 mmol/L   Potassium 4.1 3.5 - 5.1 mmol/L   Chloride 103 98 - 111 mmol/L   CO2 23 22 - 32 mmol/L   Glucose, Bld 100 (H) 70 - 99 mg/dL    Comment: Glucose reference range applies only to samples taken after fasting for at least 8 hours.   BUN 15 8 - 23 mg/dL   Creatinine, Ser 0.79 0.44 - 1.00 mg/dL   Calcium 9.0 8.9 - 10.3 mg/dL   GFR, Estimated >60 >60 mL/min    Comment: (NOTE) Calculated using the CKD-EPI Creatinine Equation (2021)    Anion gap 10 5 - 15    Comment: Performed at [name] [address] [place], [place] [address]  Glucose, capillary     Status: Abnormal   Collection Time: 02/16/21  9:40 AM  Result Value Ref Range   Glucose-Capillary 125 (H) 70 - 99 mg/dL    Comment: Glucose reference range applies only to samples taken after fasting for at least 8 hours.  Glucose, capillary     Status: None   Collection Time: 02/16/21 11:42 AM  Result Value Ref Range   Glucose-Capillary 98 70 - 99  mg/dL    Comment: Glucose reference range applies only to samples taken after fasting for at least 8 hours.   Comment 1 Notify RN    No results found.  Review of Systems  Musculoskeletal:  Positive for arthralgias.  All other systems reviewed and are negative.  Blood pressure (!) 147/69, pulse 83, temperature 98.2 F (36.8 C), resp. rate 18, height _0  (1.6 m), weight 117.5 kg, SpO2 98 %. Physical Exam Vitals reviewed.  HENT:     Head: Normocephalic.     Nose: Nose normal.     Mouth/Throat:      Mouth: Mucous membranes are moist.  Eyes:     Pupils: Pupils are equal, round, and reactive to light.  Cardiovascular:     Rate and Rhythm: Normal rate.     Pulses: Normal pulses.  Pulmonary:     Effort: Pulmonary effort is normal.  Abdominal:     General: Abdomen is flat.  Musculoskeletal:     Cervical back: Normal range of motion.  Skin:    General: Skin is warm.  Neurological:     General: No focal deficit present.     Mental Status: She is alert.  There is painful triggering over the A1 pulley of the right ring finger.  Flexion extension intact.  Radial pulse intact.  Sensation intact to the finger on the volar and dorsal surface  Assessment/Plan Impression is right hand ring trigger finger with cyst in the A1 pulley.  Discussed the benefits and risk of surgery including will include to infection nerve vessel damage finger stiffness.  I think in general the cyst to be excised at the same time as the A1 pulley was released.  Patient would like to proceed with that likely on some Friday morning.  Regarding her shoulder I think that can be worked up after her finger is improved.  She does have some limitation of active forward flexion on that side.  Debridement down to  Anderson Malta, MD 02/16/2021, 12:07 PM

## 2021-02-16 NOTE — Anesthesia Preprocedure Evaluation (Addendum)
Anesthesia Evaluation  Patient identified by MRN, date of birth, ID band Patient awake    Reviewed: Allergy & Precautions, NPO status , Patient's Chart, lab work & pertinent test results  Airway Mallampati: I  TM Distance: >3 FB Neck ROM: Full    Dental  (+) Partial Upper,    Pulmonary sleep apnea (does not use CPAP) ,    Pulmonary exam normal breath sounds clear to auscultation       Cardiovascular hypertension, Pt. on medications Normal cardiovascular exam Rhythm:Regular Rate:Normal  TTE 2010 - Normal LV size and systolic function, mild diastolic dysfunction. Normal RV size and systolic function. No significant valvular abnormalities    Neuro/Psych  Headaches, negative psych ROS   GI/Hepatic Neg liver ROS, GERD  ,  Endo/Other  diabetes, Type 2, Oral Hypoglycemic Agents, Insulin DependentMorbid obesity (BMI 46)  Renal/GU negative Renal ROS  negative genitourinary   Musculoskeletal  (+) Arthritis , Rheumatoid disorders,    Abdominal   Peds  Hematology negative hematology ROS (+)   Anesthesia Other Findings   Reproductive/Obstetrics                            Anesthesia Physical Anesthesia Plan  ASA: 3  Anesthesia Plan: General   Post-op Pain Management:    Induction: Intravenous  PONV Risk Score and Plan: 3 and Ondansetron, Dexamethasone and Midazolam  Airway Management Planned: LMA  Additional Equipment:   Intra-op Plan:   Post-operative Plan: Extubation in OR  Informed Consent: I have reviewed the patients History and Physical, chart, labs and discussed the procedure including the risks, benefits and alternatives for the proposed anesthesia with the patient or authorized representative who has indicated his/her understanding and acceptance.     Dental advisory given  Plan Discussed with: CRNA  Anesthesia Plan Comments:         Anesthesia Quick Evaluation

## 2021-02-16 NOTE — Brief Op Note (Signed)
   02/16/2021  1:47 PM  PATIENT:  Andrea Montes  66 y.o. female  PRE-OPERATIVE DIAGNOSIS:  TRIGGER FINGER RIGHT RING FINGER  POST-OPERATIVE DIAGNOSIS:  TRIGGER FINGER RIGHT RING FINGER  PROCEDURE:  Procedure(s): TRIGGER FINGER RELEASE RIGHT RING FINGER  SURGEON:  Surgeon(s): Meredith Pel, MD  ASSISTANT: magnant pa  ANESTHESIA:   general  EBL: 2 ml    Total I/O In: 100 [IV Piggyback:100] Out: 15 [Blood:15]  BLOOD ADMINISTERED: none  DRAINS: none   LOCAL MEDICATIONS USED:  plain marcaine  SPECIMEN:  No Specimen  COUNTS:  YES  TOURNIQUET:   Total Tourniquet Time Documented: Forearm (Right) - 9 minutes Total: Forearm (Right) - 9 minutes   DICTATION: .Other Dictation: Dictation Number [id number]  PLAN OF CARE: Discharge to home after PACU  PATIENT DISPOSITION:  PACU - hemodynamically stable

## 2021-02-17 ENCOUNTER — Encounter (HOSPITAL_COMMUNITY): Payer: Self-pay | Admitting: Orthopedic Surgery

## 2021-02-17 NOTE — Anesthesia Postprocedure Evaluation (Signed)
Anesthesia Post Note  Patient: Andrea Montes  Procedure(s) Performed: TRIGGER FINGER RELEASE RIGHT RING FINGER (Right: Finger)     Patient location during evaluation: PACU Anesthesia Type: General Level of consciousness: awake and alert Pain management: pain level controlled Vital Signs Assessment: post-procedure vital signs reviewed and stable Respiratory status: spontaneous breathing, nonlabored ventilation, respiratory function stable and patient connected to nasal cannula oxygen Cardiovascular status: blood pressure returned to baseline and stable Postop Assessment: no apparent nausea or vomiting Anesthetic complications: no   No notable events documented.  Last Vitals:  Vitals:   02/16/21 1420 02/16/21 1435  BP: 106/65 120/70  Pulse: 69 80  Resp: 16 20  Temp:  (!) 36.1 C  SpO2: 98% 94%    Last Pain:  Vitals:   02/16/21 1435  PainSc: 6                  Tova Vater L Irlanda Croghan

## 2021-02-17 NOTE — Op Note (Signed)
Andrea Montes, SGROI MEDICAL RECORD NO: [id number] ACCOUNT NO: [id number] DATE OF BIRTH: May 30, 1956 FACILITY: [facility] LOCATION: [facility] PHYSICIAN: Yetta Barre. Marlou Sa, MD  Operative Report   DATE OF PROCEDURE: 02/16/2021  PREOPERATIVE DIAGNOSIS:  Right ring finger trigger finger.  POSTOPERATIVE DIAGNOSIS:  Right ring finger trigger finger.  PROCEDURE:  Right ring trigger finger A1 pulley release and small ganglion cyst excision.  SURGEON:  Meredith Pel, MD  ASSISTANT:  Annie Main, PA  INDICATIONS:  The patient is a 65 year old patient with triggering of her right ring finger with failure of nonoperative management who presents for operative management after explanation of risks and benefits.  DESCRIPTION OF PROCEDURE:  The patient was brought to the operating room where general anesthetic was induced.  Preoperative antibiotics were administered.  Timeout was called.  Right hand was prepped with alcohol and Betadine, allowed to air dry,  prepped with DuraPrep solution, and draped in a sterile manner.  Charlie Pitter was not utilized for this case.  Incision was made over the A1 pulley of the right ring finger.  Skin and subcutaneous tissue were sharply divided.  Neurovascular bundles protected on  both sides.  The proximal aspect of the A1 pulley was identified.  Ganglion cyst was present emanating from the A1 pulley, which was excised.  A1 pulley was then fully released under direct visualization.  Ring finger was taken through range of motion  and found to have no further triggering.  Thorough irrigation was performed.  Numbing medicine applied around the incision.  The tourniquet was released.  Bleeding points encountered were controlled using bipolar electrocautery.  Skin was closed using  3-0 nylon sutures.  Impervious dressing applied.  The patient tolerated the procedure well without immediate complication and transferred to the recovery room in stable condition.  Luke's assistance was  required for retraction, opening, closing.  His assistance was medical necessity.   [facility] D: 02/16/2021 1:54:18 pm T: 02/16/2021 5:49:00 pm  JOB: [id number]/ [id number]

## 2021-02-24 ENCOUNTER — Encounter: Payer: Self-pay | Admitting: Orthopedic Surgery

## 2021-02-24 ENCOUNTER — Ambulatory Visit (INDEPENDENT_AMBULATORY_CARE_PROVIDER_SITE_OTHER): Payer: 59 | Admitting: Orthopedic Surgery

## 2021-02-24 ENCOUNTER — Telehealth: Payer: Self-pay | Admitting: Orthopedic Surgery

## 2021-02-24 DIAGNOSIS — M65341 Trigger finger, right ring finger: Secondary | ICD-10-CM

## 2021-02-24 NOTE — Telephone Encounter (Signed)
Pt called and was suppose to follow up on Wednesday next week and there nothing available with luke can we work her in, in the morning Wednesday?   [phone]

## 2021-02-24 NOTE — Telephone Encounter (Signed)
Treid calling. No answer. Worked her in for Wednesday am

## 2021-02-24 NOTE — Progress Notes (Signed)
Post-Op Visit Note   Patient: Andrea Montes           Date of Birth: [birth year]           MRN: [id number] Visit Date: 02/24/2021 PCP: Seward Carol, MD   Assessment & Plan:  Chief Complaint:  Chief Complaint  Patient presents with   Right Hand - Routine Post Op    Post op trigger finger release   Visit Diagnoses:  1. Trigger finger, right ring finger     Plan: Patient is a 65 year old female who presents s/p right ring finger trigger finger release on 02/16/2021.  She is doing well with no significant pain.  She does note some stiffness and swelling at the surgical site but denies any fevers, chills, drainage from the incision.  She has no recurrent triggering since the procedure.  She is able to fully straighten all of her fingers with full extension but she does have some difficulty making a composite fist.  Incision seems to be healing well with sutures intact and no signs of of cellulitis or surgical site infection..  Plan to return next Wednesday for suture removal which will be 13 days out from procedure.  No heavy lifting with the hand and plan to hold her out of work until sutures are out next week and hopefully swelling will be improved by then.  She will continue her work on finger flexion.  Follow-Up Instructions: No follow-ups on file.   Orders:  No orders of the defined types were placed in this encounter.  No orders of the defined types were placed in this encounter.   Imaging: No results found.  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 10/17/2006   Carpal tunnel syndrome, right upper limb 10/17/2006   Past Medical History:  Diagnosis Date   Anemia    no current med.   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   GERD (gastroesophageal reflux disease)    Heart murmur    last  echo- 2010   History of blood transfusion    History of shingles    Hypertension    borderline not on medicaiton at this time   Impingement syndrome of left shoulder region 10/2014   Joint pain    Migraines    Obesity    Pre-diabetes    Rheumatoid arteritis (HCC)    Seasonal allergies    Sleep apnea  uses CPAP nightly   Trigger finger, left middle finger    Vitamin D deficiency    Wears partial dentures    upper    Family History  Problem Relation Age of Onset   Kidney disease Brother    Hypertension Brother    Leukemia Mother    Heart disease Mother    Stroke Mother    Cancer Mother    Depression Mother    Anxiety disorder Mother    AAA (abdominal aortic aneurysm) Father    Esophageal cancer Maternal Uncle    Hypertension Brother    Colon cancer Neg Hx    Colon polyps Neg Hx    Rectal cancer Neg Hx    Stomach cancer Neg Hx     Past Surgical History:  Procedure Laterality Date   CARPAL TUNNEL RELEASE Left 04/25/2006   CARPAL TUNNEL RELEASE Right 07/15/2018   Procedure: RIGHT CARPAL TUNNEL RELEASE;  Surgeon: Meredith Pel, MD;  Location: [place];  Service:  Orthopedics;  Laterality: Right;   CLOSED MANIPULATION SHOULDER WITH STERIOD INJECTION Left 02/03/2015   Procedure: LEFT  MANIPULATION SHOULDER UNDER ANESTHESIA WITH INJECTION OF STEROID;  Surgeon: Kathryne Hitch, MD;  Location: [place];  Service: Orthopedics;  Laterality: Left;   COLONOSCOPY     COLONOSCOPY WITH PROPOFOL  10/09/2011   POLYPECTOMY     RESECTION DISTAL CLAVICAL Left 11/04/2014   Procedure: RESECTION DISTAL CLAVICAL;  Surgeon: Kathryne Hitch, MD;  Location: [place];  Service: Orthopedics;  Laterality: Left;   ROTATOR CUFF REPAIR  2010   SHOULDER ARTHROSCOPY WITH ROTATOR CUFF REPAIR Right 10/04/2008   SHOULDER ARTHROSCOPY WITH SUBACROMIAL DECOMPRESSION, ROTATOR CUFF REPAIR AND BICEP TENDON REPAIR Left 11/04/2014   Procedure: LEFT SHOULDER ARTHROSCOPY WITH DEBRIDEMENT, DISTAL CLAVICLE EXCISION, ACROMIOPLASTY, ROTATOR CUFF REPAIR AND BICEP TENODESIS;  Surgeon: Kathryne Hitch, MD;  Location: [place];  Service: Orthopedics;  Laterality: Left;   TRIGGER FINGER RELEASE Right 02/03/2015   Procedure: RIGHT LONG FINGER TRIGGER RELEASE;  Surgeon: Kathryne Hitch, MD;  Location: [place];  Service: Orthopedics;  Laterality: Right;   TRIGGER FINGER RELEASE Left 10/10/2017   Procedure: LEFT HAND RELEASE TRIGGER FINGER 3RD LONG FINGER AND CYST EXCISION;  Surgeon: Meredith Pel, MD;  Location: [place];  Service: Orthopedics;  Laterality: Left;   TRIGGER FINGER RELEASE Right 02/16/2021   Procedure: TRIGGER FINGER RELEASE RIGHT RING FINGER;  Surgeon: Meredith Pel, MD;  Location: [place];  Service: Orthopedics;  Laterality: Right;   TUBAL LIGATION     VAGINAL HYSTERECTOMY  2001   partial   Social History   Occupational History   Not on file  Tobacco Use   Smoking status: Never   Smokeless tobacco: Never  Vaping Use   Vaping Use: Never used  Substance and Sexual Activity   Alcohol use: No   Drug use: No   Sexual activity:  Not on file

## 2021-02-27 ENCOUNTER — Other Ambulatory Visit (HOSPITAL_COMMUNITY): Payer: Self-pay

## 2021-02-28 DIAGNOSIS — E1169 Type 2 diabetes mellitus with other specified complication: Secondary | ICD-10-CM | POA: Diagnosis not present

## 2021-02-28 DIAGNOSIS — Z0000 Encounter for general adult medical examination without abnormal findings: Secondary | ICD-10-CM | POA: Diagnosis not present

## 2021-02-28 DIAGNOSIS — G4733 Obstructive sleep apnea (adult) (pediatric): Secondary | ICD-10-CM | POA: Diagnosis not present

## 2021-02-28 DIAGNOSIS — E7800 Pure hypercholesterolemia, unspecified: Secondary | ICD-10-CM | POA: Diagnosis not present

## 2021-02-28 DIAGNOSIS — Z7984 Long term (current) use of oral hypoglycemic drugs: Secondary | ICD-10-CM | POA: Diagnosis not present

## 2021-02-28 DIAGNOSIS — I10 Essential (primary) hypertension: Secondary | ICD-10-CM | POA: Diagnosis not present

## 2021-03-01 ENCOUNTER — Other Ambulatory Visit: Payer: Self-pay | Admitting: Cardiovascular Disease

## 2021-03-01 ENCOUNTER — Telehealth: Payer: Self-pay

## 2021-03-01 ENCOUNTER — Encounter: Payer: Self-pay | Admitting: Surgical

## 2021-03-01 ENCOUNTER — Ambulatory Visit (INDEPENDENT_AMBULATORY_CARE_PROVIDER_SITE_OTHER): Payer: 59 | Admitting: Surgical

## 2021-03-01 ENCOUNTER — Other Ambulatory Visit: Payer: Self-pay

## 2021-03-01 ENCOUNTER — Other Ambulatory Visit (HOSPITAL_COMMUNITY): Payer: Self-pay

## 2021-03-01 DIAGNOSIS — M65341 Trigger finger, right ring finger: Secondary | ICD-10-CM

## 2021-03-01 MED ORDER — FUROSEMIDE 20 MG PO TABS
20.0000 mg | ORAL_TABLET | Freq: Every day | ORAL | 0 refills | Status: DC
  Filled 2021-03-01: qty 30, 30d supply, fill #0

## 2021-03-01 MED ORDER — CARESTART COVID-19 HOME TEST VI KIT
PACK | 0 refills | Status: DC
  Filled 2021-03-01: qty 4, 4d supply, fill #0

## 2021-03-01 MED ORDER — SULFAMETHOXAZOLE-TRIMETHOPRIM 800-160 MG PO TABS
1.0000 | ORAL_TABLET | Freq: Two times a day (BID) | ORAL | 0 refills | Status: DC
  Filled 2021-03-01: qty 14, 7d supply, fill #0

## 2021-03-01 MED ORDER — METFORMIN HCL ER 750 MG PO TB24
1500.0000 mg | ORAL_TABLET | Freq: Every day | ORAL | 3 refills | Status: DC
  Filled 2021-03-01: qty 180, 90d supply, fill #0

## 2021-03-01 MED FILL — Meloxicam Tab 15 MG: ORAL | 90 days supply | Qty: 90 | Fill #1 | Status: AC

## 2021-03-01 MED FILL — Losartan Potassium Tab 50 MG: ORAL | 90 days supply | Qty: 90 | Fill #1 | Status: AC

## 2021-03-01 MED FILL — Empagliflozin Tab 25 MG: ORAL | 30 days supply | Qty: 30 | Fill #1 | Status: AC

## 2021-03-01 NOTE — Telephone Encounter (Signed)
Faxed work note to [employer] from [place] per patients request

## 2021-03-01 NOTE — Progress Notes (Signed)
Post-Op Visit Note   Patient: Andrea Montes           Date of Birth: 03/31/1956           MRN: [id number] Visit Date: 03/01/2021 PCP: Seward Carol, MD   Assessment & Plan:  Chief Complaint:  Chief Complaint  Patient presents with   Right Hand - Follow-up   Visit Diagnoses:  1. Trigger finger, right ring finger     Plan: Patient is a 65 year old female who presents s/p right ring finger trigger finger release on 02/16/2021.  She returns today for reevaluation and suture removal.  She notes increased pain in the ring finger around the incision site.  She has redness and swelling around the incision site and has noticed occasional minimal drainage.  She has pain that wakes her up at night but denies any fevers, chills, night sweats, malaise.  She feels well overall aside from the finger pain.  On exam she does have erythema surrounding the incision with sutures intact.  Sutures removed and a small amount of purulent appearing drainage was expressed.  There is small amount of active drainage.  There is no fusiform swelling to the digit.  She has no tenderness through the palm proximal to the incision but she does have a little bit of tenderness through the volar aspect of the ring finger distal to the incision.  She is able to fully straighten her finger with a little bit of increased pain at the incision site.  Plan to start Bactrim DS twice daily for 7 days with follow-up appointment for recheck on the incision on Monday.  Counseled her on the signs and symptoms of flexor tenosynovitis and recommended she call the office as soon as possible if she starts to develop any signs of systemic illness/sepsis or develop concerning signs of flexor tenosynovitis.  Patient understands and agrees with the plan.  No Steri-Strips were applied to allow for drainage from the incision and she will just use a Band-Aid over the incision site.  No lifting more than 1 to 2 pounds with the  hand.     Follow-Up Instructions: No follow-ups on file.   Orders:  No orders of the defined types were placed in this encounter.  Meds ordered this encounter  Medications   sulfamethoxazole-trimethoprim (BACTRIM DS) 800-160 MG tablet    Sig: Take 1 tablet by mouth 2 (two) times daily.    Dispense:  14 tablet    Refill:  0    Imaging: No results found.  PMFS History: Patient Active Problem List   Diagnosis Date Noted   Type 2 diabetes mellitus without complication, without long-term current use of insulin ([place]) 10/01/2018   Trigger finger, right ring finger 09/09/2015   Low back pain radiating to both legs 05/06/2012   Leg length discrepancy 05/06/2012   HYPERTENSION, BENIGN 04/11/2009   LEG EDEMA, BILATERAL 10/08/2008   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 10/17/2006   Carpal tunnel syndrome, right upper limb 10/17/2006   Past Medical History:  Diagnosis Date   Anemia    no current med.   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   GERD (gastroesophageal reflux disease)    Heart murmur    last  echo- 2010   History of blood transfusion    History of shingles    Hypertension    borderline not on medicaiton at this time   Impingement syndrome of left shoulder region 10/2014   Joint pain    Migraines    Obesity    Pre-diabetes    Rheumatoid  arteritis (HCC)    Seasonal allergies    Sleep apnea    uses CPAP nightly   Trigger finger, left middle finger    Vitamin D deficiency    Wears partial dentures    upper    Family History  Problem Relation Age of Onset   Kidney disease Brother    Hypertension Brother    Leukemia Mother    Heart disease Mother    Stroke Mother    Cancer Mother    Depression Mother    Anxiety disorder Mother    AAA (abdominal aortic aneurysm) Father    Esophageal cancer Maternal Uncle    Hypertension Brother    Colon cancer Neg Hx    Colon polyps Neg Hx    Rectal cancer Neg Hx    Stomach cancer Neg Hx     Past Surgical History:  Procedure Laterality Date   CARPAL TUNNEL RELEASE Left 04/25/2006   CARPAL TUNNEL RELEASE Right 07/15/2018   Procedure: RIGHT CARPAL TUNNEL RELEASE;  Surgeon: Meredith Pel, MD;  Location: [facility];  Service: Orthopedics;  Laterality: Right;   CLOSED MANIPULATION SHOULDER WITH STERIOD INJECTION Left 02/03/2015   Procedure: LEFT  MANIPULATION SHOULDER UNDER ANESTHESIA WITH INJECTION OF STEROID;  Surgeon: Kathryne Hitch, MD;  Location: [facility];  Service: Orthopedics;  Laterality: Left;   COLONOSCOPY     COLONOSCOPY WITH PROPOFOL  10/09/2011   POLYPECTOMY     RESECTION DISTAL CLAVICAL Left 11/04/2014   Procedure: RESECTION DISTAL CLAVICAL;  Surgeon: Kathryne Hitch, MD;  Location: [facility];  Service: Orthopedics;  Laterality: Left;   ROTATOR CUFF REPAIR  2010   SHOULDER ARTHROSCOPY WITH ROTATOR CUFF REPAIR Right 10/04/2008   SHOULDER ARTHROSCOPY WITH SUBACROMIAL DECOMPRESSION, ROTATOR CUFF REPAIR AND BICEP TENDON REPAIR Left 11/04/2014   Procedure: LEFT SHOULDER ARTHROSCOPY WITH DEBRIDEMENT, DISTAL CLAVICLE EXCISION, ACROMIOPLASTY, ROTATOR CUFF REPAIR AND BICEP TENODESIS;  Surgeon: Kathryne Hitch, MD;  Location: [facility];  Service: Orthopedics;  Laterality: Left;   TRIGGER FINGER RELEASE Right 02/03/2015   Procedure: RIGHT LONG  FINGER TRIGGER RELEASE;  Surgeon: Kathryne Hitch, MD;  Location: [facility];  Service: Orthopedics;  Laterality: Right;   TRIGGER FINGER RELEASE Left 10/10/2017   Procedure: LEFT HAND RELEASE TRIGGER FINGER 3RD LONG FINGER AND CYST EXCISION;  Surgeon: Meredith Pel, MD;  Location: [facility];  Service: Orthopedics;  Laterality: Left;   TRIGGER FINGER RELEASE Right 02/16/2021   Procedure: TRIGGER FINGER RELEASE RIGHT RING FINGER;  Surgeon: Meredith Pel, MD;  Location: [facility];  Service: Orthopedics;  Laterality: Right;   TUBAL LIGATION     VAGINAL HYSTERECTOMY  2001   partial  Social History   Occupational History   Not on file  Tobacco Use   Smoking status: Never   Smokeless tobacco: Never  Vaping Use   Vaping Use: Never used  Substance and Sexual Activity   Alcohol use: No   Drug use: No   Sexual activity: Not on file

## 2021-03-02 ENCOUNTER — Encounter: Payer: Self-pay | Admitting: Orthopedic Surgery

## 2021-03-02 NOTE — Telephone Encounter (Signed)
It looks okay and fairly typical for incisions on the palm.  I recommend Neosporin to the incision twice a day with a Band-Aid.

## 2021-03-02 NOTE — Telephone Encounter (Signed)
Do you mind taking a look? Andrea Montes pt.

## 2021-03-03 ENCOUNTER — Telehealth: Payer: Self-pay

## 2021-03-03 NOTE — Telephone Encounter (Signed)
Patient called to let us know that she had lipid and CMET done at her PCP office on 7/12. Request patient to send our office a copy of lab work to put in her chart. Patient verbalized understanding.

## 2021-03-06 ENCOUNTER — Other Ambulatory Visit: Payer: Self-pay

## 2021-03-06 ENCOUNTER — Telehealth: Payer: Self-pay

## 2021-03-06 ENCOUNTER — Other Ambulatory Visit (HOSPITAL_COMMUNITY): Payer: Self-pay

## 2021-03-06 ENCOUNTER — Ambulatory Visit (INDEPENDENT_AMBULATORY_CARE_PROVIDER_SITE_OTHER): Payer: 59 | Admitting: Orthopedic Surgery

## 2021-03-06 ENCOUNTER — Ambulatory Visit (INDEPENDENT_AMBULATORY_CARE_PROVIDER_SITE_OTHER): Payer: 59

## 2021-03-06 DIAGNOSIS — M79644 Pain in right finger(s): Secondary | ICD-10-CM

## 2021-03-06 DIAGNOSIS — M25512 Pain in left shoulder: Secondary | ICD-10-CM | POA: Diagnosis not present

## 2021-03-06 MED ORDER — METFORMIN HCL ER 750 MG PO TB24
1500.0000 mg | ORAL_TABLET | Freq: Every day | ORAL | 3 refills | Status: DC
  Filled 2021-03-06 – 2021-07-12 (×2): qty 180, 90d supply, fill #0
  Filled 2022-01-12: qty 180, 90d supply, fill #1

## 2021-03-06 MED ORDER — JARDIANCE 25 MG PO TABS
25.0000 mg | ORAL_TABLET | Freq: Every day | ORAL | 3 refills | Status: DC
  Filled 2021-03-06 – 2021-07-12 (×2): qty 90, 90d supply, fill #0
  Filled 2021-10-02: qty 90, 90d supply, fill #1
  Filled 2022-01-12: qty 90, 90d supply, fill #2

## 2021-03-06 MED ORDER — LOSARTAN POTASSIUM 50 MG PO TABS
50.0000 mg | ORAL_TABLET | Freq: Every day | ORAL | 3 refills | Status: DC
  Filled 2021-03-06 – 2021-07-12 (×2): qty 90, 90d supply, fill #0
  Filled 2021-10-02: qty 90, 90d supply, fill #1
  Filled 2022-01-12: qty 90, 90d supply, fill #2

## 2021-03-06 MED ORDER — FUROSEMIDE 20 MG PO TABS
20.0000 mg | ORAL_TABLET | Freq: Every day | ORAL | 3 refills | Status: AC
  Filled 2021-03-06 – 2021-05-05 (×2): qty 90, 90d supply, fill #0

## 2021-03-06 NOTE — Telephone Encounter (Signed)
Patient was seen in our office today she is requesting the note that was written today to be faxed to [employer] call back:[phone]

## 2021-03-06 NOTE — Telephone Encounter (Signed)
faxed

## 2021-03-07 ENCOUNTER — Encounter: Payer: Self-pay | Admitting: Orthopedic Surgery

## 2021-03-07 NOTE — Addendum Note (Signed)
Addended byLaurann Montana on: 03/07/2021 08:16 AM   Modules accepted: Orders

## 2021-03-07 NOTE — Progress Notes (Signed)
Post-Op Visit Note   Patient: Andrea Montes           Date of Birth: 04-18-1956           MRN: [id number] Visit Date: 03/06/2021 PCP: Seward Carol, MD   Assessment & Plan:  Chief Complaint:  Chief Complaint  Patient presents with   Other    02/16/21 right ring finger trigger release   Visit Diagnoses:  1. Left shoulder pain, unspecified chronicity   2. Pain in right finger(s)     Plan: Andrea Montes presents for right trigger finger release follow-up.  That was on the ring finger.  She was on Bactrim.  On exam she has a little swelling around the incision but no fluctuance or induration.  She has improving but still fairly limited range of motion of the ring finger as well as some diminished grip strength.  She also reports left shoulder pain.  Has had history of 2 prior shoulder surgeries done over 5 years ago.  This was for rotator cuff pathology.  Denies any reinjury but reports significant pain going on for longer than 2 months and now she really cannot lift anything.  On examination of the left shoulder she does have fairly well-maintained passive range of motion but weakness with forward flexion and AB duction.  No restriction of external rotation of 15 degrees of abduction bilaterally.  Not too much coarse grinding or crepitus with active or passive range of motion of that left shoulder.  Plan at this time is MRI arthrogram of the left shoulder to evaluate occult arthritis versus recurrent rotator cuff tear.  Follow-up after that study.  Needs to delay return to work for 3 more weeks due to dysfunctional hand and finger diminished range of motion.  Follow-Up Instructions: Return for after MRI.   Orders:  Orders Placed This Encounter  Procedures   XR Shoulder Left   Ambulatory referral to Occupational Therapy   No orders of the defined types were placed in this encounter.   Imaging: XR Shoulder Left  Result Date: 03/07/2021 AP lateral oblique radiographs left shoulder  reviewed.  Postoperative enthesopathic changes from anchor placement noted.  Some narrowing of the acromiohumeral distance is present.  Glenohumeral joint appears well-maintained.  Prior distal clavicle excision noted.  No acute airspace disease.  No acute fracture.   PMFS History: Patient Active Problem List   Diagnosis Date Noted   Type 2 diabetes mellitus without complication, without long-term current use of insulin ([place]) 10/01/2018   Trigger finger, right ring finger 09/09/2015   Low back pain radiating to both legs 05/06/2012   Leg length discrepancy 05/06/2012   HYPERTENSION, BENIGN 04/11/2009   LEG EDEMA, BILATERAL 10/08/2008   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 10/17/2006   Carpal tunnel syndrome, right upper limb 10/17/2006   Past Medical History:  Diagnosis Date   Anemia    no current med.   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   GERD (gastroesophageal reflux disease)    Heart murmur    last  echo- 2010   History of blood transfusion    History of shingles    Hypertension    borderline not on medicaiton at this time   Impingement syndrome of left shoulder region 10/2014   Joint pain    Migraines    Obesity    Pre-diabetes    Rheumatoid arteritis (HCC)    Seasonal allergies    Sleep apnea    uses CPAP nightly   Trigger finger, left middle  finger    Vitamin D deficiency    Wears partial dentures    upper    Family History  Problem Relation Age of Onset   Kidney disease Brother    Hypertension Brother    Leukemia Mother    Heart disease Mother    Stroke Mother    Cancer Mother    Depression Mother    Anxiety disorder Mother    AAA (abdominal aortic aneurysm) Father    Esophageal cancer Maternal Uncle    Hypertension Brother    Colon cancer Neg Hx    Colon polyps Neg Hx    Rectal cancer Neg Hx    Stomach cancer Neg Hx     Past Surgical History:  Procedure Laterality Date   CARPAL TUNNEL RELEASE Left 04/25/2006   CARPAL TUNNEL RELEASE Right 07/15/2018   Procedure: RIGHT CARPAL TUNNEL RELEASE;  Surgeon: Meredith Pel, MD;  Location: [facility];  Service: Orthopedics;  Laterality: Right;   CLOSED MANIPULATION SHOULDER WITH STERIOD INJECTION Left 02/03/2015   Procedure: LEFT  MANIPULATION SHOULDER UNDER ANESTHESIA WITH INJECTION OF STEROID;  Surgeon: Kathryne Hitch, MD;  Location: [facility];  Service: Orthopedics;  Laterality: Left;   COLONOSCOPY     COLONOSCOPY WITH PROPOFOL  10/09/2011   POLYPECTOMY     RESECTION DISTAL CLAVICAL Left 11/04/2014   Procedure: RESECTION DISTAL CLAVICAL;  Surgeon: Kathryne Hitch, MD;  Location: [facility];  Service: Orthopedics;  Laterality: Left;   ROTATOR CUFF REPAIR  2010   SHOULDER ARTHROSCOPY WITH ROTATOR CUFF REPAIR Right 10/04/2008   SHOULDER ARTHROSCOPY WITH SUBACROMIAL DECOMPRESSION, ROTATOR CUFF REPAIR AND BICEP TENDON REPAIR Left 11/04/2014   Procedure: LEFT SHOULDER ARTHROSCOPY WITH DEBRIDEMENT, DISTAL CLAVICLE EXCISION, ACROMIOPLASTY, ROTATOR CUFF REPAIR AND BICEP TENODESIS;  Surgeon: Kathryne Hitch, MD;  Location: [facility];  Service: Orthopedics;  Laterality: Left;   TRIGGER FINGER RELEASE Right 02/03/2015   Procedure: RIGHT LONG FINGER TRIGGER RELEASE;  Surgeon: Kathryne Hitch, MD;  Location: [facility];  Service:  Orthopedics;  Laterality: Right;   TRIGGER FINGER RELEASE Left 10/10/2017   Procedure: LEFT HAND RELEASE TRIGGER FINGER 3RD LONG FINGER AND CYST EXCISION;  Surgeon: Meredith Pel, MD;  Location: [facility];  Service: Orthopedics;  Laterality: Left;   TRIGGER FINGER RELEASE Right 02/16/2021   Procedure: TRIGGER FINGER RELEASE RIGHT RING FINGER;  Surgeon: Meredith Pel, MD;  Location: [facility];  Service: Orthopedics;  Laterality: Right;   TUBAL LIGATION     VAGINAL HYSTERECTOMY  2001   partial   Social History   Occupational History   Not on file  Tobacco Use   Smoking status: Never   Smokeless tobacco: Never  Vaping Use   Vaping Use: Never used  Substance and Sexual Activity   Alcohol use: No   Drug use: No   Sexual activity: Not on file

## 2021-03-09 ENCOUNTER — Encounter: Payer: Self-pay | Admitting: Occupational Therapy

## 2021-03-09 ENCOUNTER — Ambulatory Visit: Payer: 59 | Attending: Neurology | Admitting: Occupational Therapy

## 2021-03-09 ENCOUNTER — Other Ambulatory Visit: Payer: Self-pay

## 2021-03-09 DIAGNOSIS — M25631 Stiffness of right wrist, not elsewhere classified: Secondary | ICD-10-CM

## 2021-03-09 DIAGNOSIS — M79641 Pain in right hand: Secondary | ICD-10-CM | POA: Diagnosis not present

## 2021-03-09 DIAGNOSIS — M25641 Stiffness of right hand, not elsewhere classified: Secondary | ICD-10-CM | POA: Diagnosis not present

## 2021-03-09 DIAGNOSIS — M6281 Muscle weakness (generalized): Secondary | ICD-10-CM | POA: Diagnosis not present

## 2021-03-09 DIAGNOSIS — R278 Other lack of coordination: Secondary | ICD-10-CM | POA: Diagnosis not present

## 2021-03-09 NOTE — Therapy (Signed)
[facility] [address] [place], [place], [address] Phone: [phone]   Fax:  [phone]  Occupational Therapy Evaluation  Patient Details  Name: Andrea Montes MRN: [id number] Date of Birth: Dec 18, 1955 Referring Provider (OT): Meredith Pel, MD   Encounter Date: 03/09/2021   OT End of Session - 03/09/21 1050     Visit Number 1    Number of Visits 10    Date for OT Re-Evaluation 04/20/21   2x/week for 3 weeks, 1x/week for 3 weeks = 9 visits over 8 weeks to allow for missed visits and/or scheduling conflicts   Authorization Type [place]    Authorization Time Period VL: 25    OT Start Time 1015    OT Stop Time 1100    OT Time Calculation (min) 45 min    Activity Tolerance Patient tolerated treatment well    Behavior During Therapy [facility] for tasks assessed/performed             Past Medical History:  Diagnosis Date   Anemia    no current med.   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   GERD (gastroesophageal reflux disease)    Heart murmur    last  echo- 2010   History of blood transfusion    History of shingles    Hypertension    borderline not on medicaiton at this time   Impingement syndrome of left shoulder region 10/2014   Joint pain    Migraines    Obesity    Pre-diabetes    Rheumatoid arteritis (HCC)    Seasonal allergies    Sleep apnea    uses CPAP nightly   Trigger finger, left middle finger     Vitamin D deficiency    Wears partial dentures    upper    Past Surgical History:  Procedure Laterality Date   CARPAL TUNNEL RELEASE Left 04/25/2006   CARPAL TUNNEL RELEASE Right 07/15/2018   Procedure: RIGHT CARPAL TUNNEL RELEASE;  Surgeon: Meredith Pel, MD;  Location: [facility];  Service: Orthopedics;  Laterality: Right;   CLOSED MANIPULATION SHOULDER WITH STERIOD INJECTION Left 02/03/2015   Procedure: LEFT  MANIPULATION SHOULDER UNDER ANESTHESIA WITH INJECTION OF STEROID;  Surgeon: Kathryne Hitch, MD;  Location: [facility];  Service: Orthopedics;  Laterality: Left;   COLONOSCOPY     COLONOSCOPY WITH PROPOFOL  10/09/2011   POLYPECTOMY     RESECTION DISTAL CLAVICAL Left 11/04/2014   Procedure: RESECTION DISTAL CLAVICAL;  Surgeon: Kathryne Hitch, MD;  Location: [facility];  Service: Orthopedics;  Laterality: Left;   ROTATOR CUFF REPAIR  2010   SHOULDER ARTHROSCOPY WITH ROTATOR CUFF REPAIR Right 10/04/2008   SHOULDER ARTHROSCOPY WITH SUBACROMIAL DECOMPRESSION, ROTATOR CUFF REPAIR AND BICEP TENDON REPAIR Left 11/04/2014   Procedure: LEFT SHOULDER ARTHROSCOPY WITH DEBRIDEMENT, DISTAL CLAVICLE EXCISION, ACROMIOPLASTY, ROTATOR CUFF REPAIR AND BICEP TENODESIS;  Surgeon: Quillian Quince  Percell Miller, MD;  Location: [facility];  Service: Orthopedics;  Laterality: Left;   TRIGGER FINGER RELEASE Right 02/03/2015   Procedure: RIGHT LONG FINGER TRIGGER RELEASE;  Surgeon: Kathryne Hitch, MD;  Location: [facility];  Service: Orthopedics;  Laterality: Right;   TRIGGER FINGER RELEASE Left 10/10/2017   Procedure: LEFT HAND RELEASE TRIGGER FINGER 3RD LONG FINGER AND CYST EXCISION;  Surgeon: Meredith Pel, MD;  Location: [facility];  Service: Orthopedics;  Laterality: Left;   TRIGGER FINGER RELEASE Right 02/16/2021   Procedure: TRIGGER FINGER RELEASE RIGHT RING FINGER;  Surgeon: Meredith Pel, MD;  Location: [facility];  Service: Orthopedics;  Laterality: Right;   TUBAL  LIGATION     VAGINAL HYSTERECTOMY  2001   partial    There were no vitals filed for this visit.   Subjective Assessment - 03/09/21 1037     Subjective  Pt is a 65 year old female that presents to [facility] s/p trigger finger release in ring finger on RUE on 02/16/21. Pt reports significant pain in palm of RUE of 4/10 with spasm nature. Pt hypersensitive to touch on palm of RUE at incision/surgical site. Pt reports that digits 3 and 4 are most stiff on RUE. Pt has had previous trigger finger release on long finger on RUE and anticipates release on thumb of LUE. Pt's primary goal is reported "to be able to use my hand". 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    Pertinent History PMH: DM, partial hysterectomy, RTC BUE (currently LUE shoulder pain waiting for MRI)    Limitations no lifitng over 2 lbs RUE    Patient Stated Goals "to be able to use my hand"    Currently in Pain? Yes    Pain Score 4     Pain Location Hand    Pain Orientation Other (Comment)   palm/volar   Pain Descriptors / Indicators Spasm    Pain Type Surgical pain    Pain Onset 1 to 4 weeks ago    Pain Frequency Intermittent               [facility] OT Assessment - 03/09/21 1018       Assessment   Medical Diagnosis R ring finger trigger release    Referring Provider (OT) Meredith Pel, MD    Onset Date/Surgical Date 02/16/21    Hand Dominance Right      Precautions   Precautions None    Precaution Comments no lifting with RUE over 2 lbs      Restrictions   Weight Bearing Restrictions Yes    RUE Weight Bearing Weight bear through elbow only      Home  Environment   Family/patient expects to be discharged to: Private residence    Living Arrangements Other relatives   grandchildren   Type of [place] One level      Prior Function   Level of Independence Independent    Vocation Full time employment    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; party planner    Leisure crafting, reading, watch tv, cooking      ADL   Eating/Feeding Needs assist with cutting food   uses left hand right now   Grooming Modified independent   using non dominant hand right now   Upper Body Bathing Modified independent    Lower Body Bathing Modified independent  Upper Body Dressing Increased time   front clasp bra   Lower Body Dressing Increased time    Toilet Transfer Modified independent    Toileting - Clothing Manipulation Modified independent;Increased time    Toileting -  Hygiene Modified Independent   using non dominant hand   Tub/Shower Transfer Modified independent      IADL   Prior Level of Function Shopping independent    Shopping Needs to be accompanied on any shopping trip;Shops independently for small purchases   uses non dominant hand predominantly   Prior Level of Function Light Housekeeping independent    Light Housekeeping Needs help with all home maintenance tasks    Prior Level of Function Meal Prep independent    Meal Prep Able to complete simple warm meal prep   granddaughter helps with cutting vegetables   Prior Level of Function Community Mobility independent    Community Mobility Drives own vehicle   predominantly using non dominant hand   Medication Management Is responsible for taking medication in correct dosages at correct time    Physiological scientist financial matters independently (budgets, writes checks, pays rent, bills goes to bank), collects and keeps track of income      Written Expression   Dominant Hand Right    Handwriting 25% legible   has difficulty using dominant hand     Vision - History   Baseline Vision Wears glasses all the time      Sensation   Light Touch Appears Intact   increased sensitivity to touch with RUE   Hot/Cold Impaired Detail    Hot/Cold Impaired Details Impaired RUE   more sensitive     Coordination   9 Hole Peg Test Right;Left    Right 9 Hole  Peg Test 32.09s   swiped pegs to remove   Left 9 Hole Peg Test 40.25s   wrist discomfort and and popping during task reports trigger in thumb     ROM / Strength   AROM / PROM / Strength AROM      AROM   Overall AROM  Deficits    AROM Assessment Site Wrist    Right/Left Wrist Right    Right Wrist Extension 55 Degrees   L 50   Right Wrist Flexion 35 Degrees   L 50     Hand Function   Right Hand Gross Grasp Impaired   approx 90% extension, 45% flexion   Right Hand Grip (lbs) unable to test d/t precautions    Left Hand Gross Grasp Functional    Left Hand Grip (lbs) 41.6                             OT Education - 03/09/21 1047     Education Details Finger AROM HEP - see pt instructions    Person(s) Educated Patient    Methods Explanation;Demonstration;Handout    Comprehension Verbalized understanding;Returned demonstration              OT Short Term Goals - 03/09/21 1744       OT SHORT TERM GOAL #1   Title Pt will be independent with initial HEP    Time 4    Period Weeks    Status New    Target Date 04/06/21      OT SHORT TERM GOAL #2   Title Pt will increase range of motion of RUE wrist flexion to 40 degrees or more for increase in functional  use    Baseline 35*    Time 4    Period Weeks    Status New      OT SHORT TERM GOAL #3   Title Pt will demonstrate scar massage and management techniques.    Time 4    Period Weeks    Status New      OT SHORT TERM GOAL #4   Title Pt will increase composite flexion to 60% or greater to increase functional use of RUE    Baseline ~45%    Time 4    Period Weeks    Status New      OT SHORT TERM GOAL #5   Title Pt will report pain no greater than 3/10 while completing HEP    Baseline 4/10 at rest    Time 4    Period Weeks    Status New               OT Long Term Goals - 03/09/21 1747       OT LONG TERM GOAL #1   Title Pt will be independent with updated HEPs    Time 8    Period Weeks     Status New    Target Date 05/04/21      OT LONG TERM GOAL #2   Title Pt will increase composite flexion to 80% or greater in RUE    Baseline 45%    Time 8    Period Weeks    Status New      OT LONG TERM GOAL #3   Title Pt will demonstrate grip strength in RUE of 30 lbs or greater for demonstrating functional strength in RUE, dominant hand.    Baseline not tested at eval d/t precautions    Time 8    Period Weeks    Status New      OT LONG TERM GOAL #4   Title Pt will increase 9 hole peg test score with RUE by 3 seconds for improved coordination    Baseline R 32.09s (swiped pegs out to remove)    Time 8    Period Weeks    Status New      OT LONG TERM GOAL #5   Title Pt will demonstrate composite extension of 100% in RUE.    Baseline 90%    Time 8    Period Weeks    Status New                   Plan - 03/09/21 1050     Clinical Impression Statement Pt is a 65 year old female that presents to [facility] s/p trigger finger release in ring finger on RUE on 02/16/21. PMH for Bilateral RTC surgeries and current LUE shoulder pain with pending MRI, partial hysterectomy, DM that is well managed. Pt presents with stiffness, pain and overall sensitivity with RUE decreasing functional use, strength and coordination with RUE. Skilled occupational therapy is recommended to target listed areas of deficit and increase independence with ADLs and IADLs.    OT Occupational Profile and History Problem Focused Assessment - Including review of records relating to presenting problem    Occupational performance deficits (Please refer to evaluation for details): IADL's;ADL's;Leisure;Work    Marketing executive / Function / Physical Skills ADL;Strength;Decreased knowledge of use of DME;GMC;Pain;Dexterity;UE functional use;ROM;IADL;Wound;Sensation;Flexibility;Coordination;FMC;Decreased knowledge of precautions    Rehab Potential Good    Clinical Decision Making Limited treatment options, no task  modification necessary    Comorbidities Affecting  Occupational Performance: None    Modification or Assistance to Complete Evaluation  No modification of tasks or assist necessary to complete eval    OT Frequency 2x / week   2x/week for 3 weeks, 1x/week for 3 weeks   OT Duration 8 weeks   9 visits over 8 weeks d/t scheduling and potential missed visits   OT Treatment/Interventions Moist Heat;Fluidtherapy;Manual Therapy;Paraffin;Electrical Stimulation;Self-care/ADL training;Therapeutic exercise;Ultrasound;Cryotherapy;Patient/family education;Passive range of motion;Scar mobilization;Therapeutic activities;DME and/or AE instruction;Splinting    Plan Review AROM, progress to PROM if tolerating AROM, scar massage, fluido?    OT Home Exercise Plan AROM finger    Consulted and Agree with Plan of Care Patient             Patient will benefit from skilled therapeutic intervention in order to improve the following deficits and impairments:   Body Structure / Function / Physical Skills: ADL, Strength, Decreased knowledge of use of DME, GMC, Pain, Dexterity, UE functional use, ROM, IADL, Wound, Sensation, Flexibility, Coordination, FMC, Decreased knowledge of precautions       Visit Diagnosis: Stiffness of right hand, not elsewhere classified - Plan: Ot plan of care cert/re-cert  Pain in right hand - Plan: Ot plan of care cert/re-cert  Muscle weakness (generalized) - Plan: Ot plan of care cert/re-cert  Other lack of coordination - Plan: Ot plan of care cert/re-cert  Stiffness of right wrist, not elsewhere classified - Plan: Ot plan of care cert/re-cert    Problem List Patient Active Problem List   Diagnosis Date Noted   Type 2 diabetes mellitus without complication, without long-term current use of insulin ([place]) 10/01/2018   Trigger finger, right ring finger 09/09/2015   Low back pain radiating to both legs 05/06/2012   Leg length discrepancy 05/06/2012   HYPERTENSION, BENIGN  04/11/2009   LEG EDEMA, BILATERAL 10/08/2008   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 10/17/2006   Carpal tunnel syndrome, right upper limb 10/17/2006    Zachery Conch MOT, OTR/L  03/09/2021, 5:51 PM  [facility] [address] [place], [place], [address] Phone: [phone]   Fax:  [phone]  Name: ELGENE CORAL MRN: [id number] Date of Birth: 10/23/1955

## 2021-03-09 NOTE — Patient Instructions (Signed)
MP Flexion (Active Isolated)   Bend ______ finger at large knuckle, keeping other fingers straight. Do not bend tips. Repeat _10-15___ times. Do __4-6__ sessions per day.  AROM: PIP Flexion / Extension   Pinch bottom knuckle of ________ finger of hand to prevent bending. Actively bend middle knuckle until stretch is felt. Hold __5__ seconds. Relax. Straighten finger as far as possible. Repeat __10-15__ times per set. Do _4-6___ sessions per day.   AROM: DIP Flexion / Extension   Pinch middle knuckle of ________ finger of  hand to prevent bending. Bend end knuckle until stretch is felt. Hold _5___ seconds. Relax. Straighten finger as far as possible. Repeat _10-15___ times per set.  Do _4-6___ sessions per day.  AROM: Finger Flexion / Extension   Actively bend fingers of  hand. Start with knuckles furthest from palm, and slowly make a fist. Hold __5__ seconds. Relax. Then straighten fingers as far as possible. Repeat _10-15___ times per set.  Do _4-6___ sessions per day.  Copyright  [employer]. All rights reserved.

## 2021-03-10 NOTE — Progress Notes (Signed)
CARDIOLOGY CONSULT NOTE       Patient ID: Andrea Montes MRN: [id number] DOB/AGE: Apr 15, 1956 65 y.o.  Admit date: (Not on file) Referring [place] Primary Physician: Seward Carol, MD Primary Cardiologist: New Reason for Consultation: Dyspnea  Active Problems:   * No active hospital problems. *   HPI:  65 y.o. referred by Dr Delfina Redwood for dyspnea Last seen by cardiology in 2019. History of obesity, OSA, GERD , HTN, DM-2 She had a calcium score of 13 done on 10/26/20 74 th percentile for age and sex. No chest pain, palpitations or syncope Has not had recent echo  LDL is 93 on 10/28/20 Recent orthopedic issues include right trigger finger release By Dr Marlou Sa. She has had 2 previous left shoulder surgeries with some pain still Pending MR arthrogram  Just had a great grand baby Has been out of work due to complications from trigger finger surgery Still getting rehab/PT   LDL done this month down to 63 on statin   ROS All other systems reviewed and negative except as noted above  Past Medical History:  Diagnosis Date   Anemia    no current med.   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   GERD (gastroesophageal reflux disease)    Heart murmur    last  echo- 2010   History of blood transfusion    History of shingles    Hypertension    borderline not on medicaiton at this time   Impingement syndrome of left shoulder region 10/2014   Joint pain    Migraines    Obesity    Pre-diabetes     Rheumatoid arteritis (HCC)    Seasonal allergies    Sleep apnea    uses CPAP nightly   Trigger finger, left middle finger    Vitamin D deficiency    Wears partial dentures    upper    Family History  Problem Relation Age of Onset   Kidney disease Brother    Hypertension Brother    Leukemia Mother    Heart disease Mother    Stroke Mother    Cancer Mother    Depression Mother    Anxiety disorder Mother    AAA (abdominal aortic aneurysm) Father    Esophageal cancer Maternal Uncle    Hypertension Brother    Colon cancer Neg Hx    Colon polyps Neg Hx    Rectal cancer Neg Hx    Stomach cancer Neg Hx     Social History   Socioeconomic History   Marital status: Legally Separated    Spouse name: Not on file   Number of children: Not on file   Years of education: Not on file   Highest education level: Not on file  Occupational History   Not on file  Tobacco Use   Smoking status: Never   Smokeless tobacco: Never  Vaping Use   Vaping Use: Never  used  Substance and Sexual Activity   Alcohol use: No   Drug use: No   Sexual activity: Not on file  Other Topics Concern   Not on file  Social History Narrative   Not on file   Social Determinants of Health   Financial Resource Strain: Not on file  Food Insecurity: Not on file  Transportation Needs: Not on file  Physical Activity: Not on file  Stress: Not on file  Social Connections: Not on file  Intimate Partner Violence: Not on file    Past Surgical History:  Procedure Laterality Date   CARPAL TUNNEL RELEASE Left 04/25/2006   CARPAL TUNNEL RELEASE Right 07/15/2018   Procedure: RIGHT CARPAL TUNNEL RELEASE;  Surgeon: Meredith Pel, MD;  Location: [place];  Service: Orthopedics;  Laterality: Right;   CLOSED MANIPULATION SHOULDER WITH STERIOD INJECTION Left 02/03/2015   Procedure: LEFT  MANIPULATION SHOULDER UNDER ANESTHESIA WITH INJECTION OF STEROID;  Surgeon: Kathryne Hitch, MD;  Location: [place];   Service: Orthopedics;  Laterality: Left;   COLONOSCOPY     COLONOSCOPY WITH PROPOFOL  10/09/2011   POLYPECTOMY     RESECTION DISTAL CLAVICAL Left 11/04/2014   Procedure: RESECTION DISTAL CLAVICAL;  Surgeon: Kathryne Hitch, MD;  Location: [place];  Service: Orthopedics;  Laterality: Left;   ROTATOR CUFF REPAIR  2010   SHOULDER ARTHROSCOPY WITH ROTATOR CUFF REPAIR Right 10/04/2008   SHOULDER ARTHROSCOPY WITH SUBACROMIAL DECOMPRESSION, ROTATOR CUFF REPAIR AND BICEP TENDON REPAIR Left 11/04/2014   Procedure: LEFT SHOULDER ARTHROSCOPY WITH DEBRIDEMENT, DISTAL CLAVICLE EXCISION, ACROMIOPLASTY, ROTATOR CUFF REPAIR AND BICEP TENODESIS;  Surgeon: Kathryne Hitch, MD;  Location: [place];  Service: Orthopedics;  Laterality: Left;   TRIGGER FINGER RELEASE Right 02/03/2015   Procedure: RIGHT LONG FINGER TRIGGER RELEASE;  Surgeon: Kathryne Hitch, MD;  Location: [place];  Service: Orthopedics;  Laterality: Right;   TRIGGER FINGER RELEASE Left 10/10/2017   Procedure: LEFT HAND RELEASE TRIGGER FINGER 3RD LONG FINGER AND CYST EXCISION;  Surgeon: Meredith Pel, MD;  Location: [place];  Service: Orthopedics;  Laterality: Left;   TRIGGER FINGER RELEASE Right 02/16/2021   Procedure: TRIGGER FINGER RELEASE RIGHT RING FINGER;  Surgeon: Meredith Pel, MD;  Location: [place];  Service: Orthopedics;  Laterality: Right;   TUBAL LIGATION     VAGINAL HYSTERECTOMY  2001   partial      Current Outpatient Medications:    Aspirin-Acetaminophen-Caffeine (GOODY HEADACHE PO), Take 1 Package by mouth as needed (pain)., Disp: , Rfl:    atorvastatin (LIPITOR) 10 MG tablet, TAKE 1 TABLET (10 MG TOTAL) BY MOUTH AT BEDTIME., Disp: 90 tablet, Rfl: 3   Blood Pressure Monitoring (OMRON 3 SERIES BP MONITOR) DEVI, , Disp: , Rfl:    calcium carbonate (TUMS EX) 750 MG chewable tablet, Chew 1-2 tablets by mouth 2 (two) times daily as needed for heartburn., Disp: , Rfl:    Cholecalciferol  (VITAMIN D3) 50 MCG (2000 UT) TABS, Take 2,000 Units by mouth every Friday., Disp: , Rfl:    COVID-19 At Home Antigen Test (CARESTART COVID-19 HOME TEST) KIT, Use as directed, Disp: 4 each, Rfl: 0   empagliflozin (JARDIANCE) 25 MG TABS tablet, Take 1 tablet (25 mg total) by mouth daily., Disp: 90 tablet, Rfl: 3   empagliflozin (JARDIANCE) 25 MG TABS tablet, Take 1 tablet (25 mg total) by mouth daily., Disp: 90 tablet, Rfl: 3   FREESTYLE LITE test strip, , Disp: , Rfl:  furosemide (LASIX) 20 MG tablet, Take 1 tablet (20 mg total) by mouth daily., Disp: 30 tablet, Rfl: 0   furosemide (LASIX) 20 MG tablet, Take 1 tablet (20 mg total) by mouth daily., Disp: 90 tablet, Rfl: 3   Insulin Glargine-yfgn 100 UNIT/ML SOPN, INJECT 10 UNITS ONCE A DAY AT BEDTIME SUBCUTANEOUSLY, Disp: 15 mL, Rfl: 5   Insulin Pen Needle 31G X 6 MM MISC, USE AS DIRECTED ONCE A DAY, Disp: 100 each, Rfl: 3   Lancets (FREESTYLE) lancets, 1 each 3 (three) times daily., Disp: , Rfl:    losartan (COZAAR) 50 MG tablet, Take 1 tablet (50 mg total) by mouth daily., Disp: 90 tablet, Rfl: 3   losartan (COZAAR) 50 MG tablet, TAKE 1 TABLET BY MOUTH ONCE A DAY, Disp: 90 tablet, Rfl: 3   losartan (COZAAR) 50 MG tablet, Take 1 tablet (50 mg total) by mouth daily., Disp: 90 tablet, Rfl: 3   meloxicam (MOBIC) 15 MG tablet, Take 1 tablet (15 mg total) by mouth daily as needed for pain., Disp: 60 tablet, Rfl: 1   meloxicam (MOBIC) 15 MG tablet, TAKE 1 TABLET BY MOUTH ONCE DAILY, Disp: 90 tablet, Rfl: 3   metFORMIN (GLUCOPHAGE-XR) 750 MG 24 hr tablet, Take 2 tablets (1,500 mg total) by mouth daily with food., Disp: 180 tablet, Rfl: 3   metFORMIN (GLUCOPHAGE-XR) 750 MG 24 hr tablet, Take 2 tablets (1,500 mg total) by mouth daily with food, Disp: 180 tablet, Rfl: 3   methocarbamol (ROBAXIN) 750 MG tablet, Take 1 tablet (750 mg total) by mouth every 8 hours as needed, Disp: 30 tablet, Rfl: 1   Multiple Vitamin (MULTIVITAMIN WITH MINERALS) TABS tablet,  Take 1 tablet by mouth every other day. In the morning, Disp: , Rfl:    nystatin (MYCOSTATIN) 100000 UNIT/ML suspension, Swish 15 mLs (1 tablespoon) by mouth 4 times daily then expectorate., Disp: 473 mL, Rfl: 1   sulfamethoxazole-trimethoprim (BACTRIM DS) 800-160 MG tablet, Take 1 tablet by mouth 2 (two) times daily., Disp: 14 tablet, Rfl: 0   SUMAtriptan (IMITREX) 25 MG tablet, TAKE 1 TABLET BY MOUTH AT ONSET OF MIGRAINE, MAY REPEAT IN 1 HR IF NEEDED, Disp: 15 tablet, Rfl: 5   traMADol (ULTRAM) 50 MG tablet, Take 1 tablet (50 mg total) by mouth every 6 (six) hours as needed., Disp: 30 tablet, Rfl: 0   metFORMIN (GLUCOPHAGE-XR) 750 MG 24 hr tablet, TAKE 2 TABLETS BY MOUTH DAILY WTIH FOOD., Disp: 180 tablet, Rfl: 3  Current Facility-Administered Medications:    0.9 %  sodium chloride infusion, 500 mL, Intravenous, Continuous, Nandigam, Kavitha V, MD   sodium chloride      Physical Exam: Blood pressure 126/74, pulse 85, height 5' 3"  (1.6 m), weight 118.4 kg, SpO2 98 %.    Affect appropriate Overweight black female  HEENT: normal Neck supple with no adenopathy JVP normal no bruits no thyromegaly Lungs clear with no wheezing and good diaphragmatic motion Heart:  S1/S2 no murmur, no rub, gallop or click PMI normal Abdomen: benighn, BS positve, no tenderness, no AAA no bruit.  No HSM or HJR Distal pulses intact with no bruits No edema Neuro non-focal Skin warm and dry Recent right hand surgery    Labs:   Lab Results  Component Value Date   WBC 8.3 02/16/2021   HGB 12.7 02/16/2021   HCT 42.7 02/16/2021   MCV 69.5 (L) 02/16/2021   PLT 200 02/16/2021   No results for input(s): NA, K, CL, CO2, BUN, CREATININE, CALCIUM,  PROT, BILITOT, ALKPHOS, ALT, AST, GLUCOSE in the last 168 hours.  Invalid input(s): LABALBU No results found for: CKTOTAL, CKMB, CKMBINDEX, TROPONINI  Lab Results  Component Value Date   CHOL 149 10/28/2020   CHOL 152 10/16/2018   CHOL 130 05/07/2018   Lab  Results  Component Value Date   HDL 37 (L) 10/28/2020   HDL 37 (L) 10/16/2018   HDL 35 (L) 05/07/2018   Lab Results  Component Value Date   LDLCALC 93 10/28/2020   LDLCALC 100 (H) 10/16/2018   LDLCALC 77 05/07/2018   Lab Results  Component Value Date   TRIG 103 10/28/2020   TRIG 75 10/16/2018   TRIG 89 05/07/2018   Lab Results  Component Value Date   CHOLHDL 4.0 10/28/2020   CHOLHDL 4.1 Ratio 09/26/2009   No results found for: LDLDIRECT    Radiology: XR Shoulder Left  Result Date: 03/07/2021 AP lateral oblique radiographs left shoulder reviewed.  Postoperative enthesopathic changes from anchor placement noted.  Some narrowing of the acromiohumeral distance is present.  Glenohumeral joint appears well-maintained.  Prior distal clavicle excision noted.  No acute airspace disease.  No acute fracture.   EKG: SR rate 77 normal 02/16/21    ASSESSMENT AND PLAN:   DM:  Discussed low carb diet.  Target hemoglobin A1c is 6.5 or less.  Continue current medications. HTN:  Well controlled.  Continue current medications and low sodium Dash type diet.   HLD:  with relatively high calcium score for age LDL at goal now on low dose statin  Dyspnea: functional from weight and deconditioning observe  Ortho:  f/u with Dr Marlou Sa for right trigger finger and left shoulder PT/OT   F/U PRN ast patients' discretion  Signed: Jenkins Rouge 03/13/2021, 8:05 AM

## 2021-03-13 ENCOUNTER — Encounter: Payer: Self-pay | Admitting: Cardiovascular Disease

## 2021-03-13 ENCOUNTER — Ambulatory Visit (INDEPENDENT_AMBULATORY_CARE_PROVIDER_SITE_OTHER): Payer: 59 | Admitting: Cardiovascular Disease

## 2021-03-13 ENCOUNTER — Other Ambulatory Visit: Payer: Self-pay

## 2021-03-13 VITALS — BP 126/74 | HR 85 | Ht 63.0 in | Wt 261.0 lb

## 2021-03-13 DIAGNOSIS — R0600 Dyspnea, unspecified: Secondary | ICD-10-CM | POA: Diagnosis not present

## 2021-03-13 DIAGNOSIS — E782 Mixed hyperlipidemia: Secondary | ICD-10-CM

## 2021-03-13 DIAGNOSIS — I2510 Atherosclerotic heart disease of native coronary artery without angina pectoris: Secondary | ICD-10-CM | POA: Diagnosis not present

## 2021-03-13 DIAGNOSIS — I10 Essential (primary) hypertension: Secondary | ICD-10-CM

## 2021-03-13 DIAGNOSIS — R0609 Other forms of dyspnea: Secondary | ICD-10-CM

## 2021-03-13 NOTE — Patient Instructions (Addendum)
Medication Instructions:  *If you need a refill on your cardiac medications before your next appointment, please call your pharmacy*  Lab Work: If you have labs (blood work) drawn today and your tests are completely normal, you will receive your results only by: [place] (if you have MyChart) OR A paper copy in the mail If you have any lab test that is abnormal or we need to change your treatment, we will call you to review the results.  Testing/Procedures: None ordered at this time.  Follow-Up: At [facility], you and your health needs are our priority.  As part of our continuing mission to provide you with exceptional heart care, we have created designated Provider Care Teams.  These Care Teams include your primary Cardiologist (physician) and Advanced Practice Providers (APPs -  Physician Assistants and Nurse Practitioners) who all work together to provide you with the care you need, when you need it.  We recommend signing up for the patient portal called "MyChart".  Sign up information is provided on this After Visit Summary.  MyChart is used to connect with patients for Virtual Visits (Telemedicine).  Patients are able to view lab/test results, encounter notes, upcoming appointments, etc.  Non-urgent messages can be sent to your provider as well.   To learn more about what you can do with MyChart, go to [email or web address].    Your next appointment:   As needed  The format for your next appointment:   In Person  Provider:   You may see Dr. Johnsie Cancel or one of the following Advanced Practice Providers on your designated Care Team:   Cecilie Kicks, NP

## 2021-03-14 ENCOUNTER — Ambulatory Visit: Payer: 59 | Admitting: Occupational Therapy

## 2021-03-14 ENCOUNTER — Other Ambulatory Visit (HOSPITAL_COMMUNITY): Payer: Self-pay

## 2021-03-17 ENCOUNTER — Ambulatory Visit: Payer: 59 | Admitting: Occupational Therapy

## 2021-03-20 ENCOUNTER — Encounter: Payer: 59 | Admitting: Occupational Therapy

## 2021-03-21 ENCOUNTER — Encounter: Payer: Self-pay | Admitting: Orthopedic Surgery

## 2021-03-22 ENCOUNTER — Telehealth: Payer: Self-pay | Admitting: Orthopedic Surgery

## 2021-03-22 NOTE — Telephone Encounter (Signed)
Please advise how long you would like patient to attend physical therapy prior to returning to work and how long is patient able to stay out of work. She is currently being treated for her left shoulder. MRI has been scheduled for 03/28/2021.

## 2021-03-22 NOTE — Telephone Encounter (Signed)
Pt called stating she needs a CB in regards to getting an extension for her out of work note. She states she wasn't supposed to go back until after she completed PT and then had a F/U with Dr.Dean. Pt states her job has her coming back on 03/28/21; and she's only had about 2 sessions. Pt would like a CB when the work note extending her absence is ready for pick up.    [phone]

## 2021-03-23 NOTE — Telephone Encounter (Signed)
6 weeks total pt and oow time thx

## 2021-03-23 NOTE — Telephone Encounter (Signed)
[place] for [employer] for 3 more weeks disregard earlier mssg thx

## 2021-03-23 NOTE — Telephone Encounter (Signed)
Note has been created and posted to patient's mychart.

## 2021-03-24 ENCOUNTER — Other Ambulatory Visit: Payer: Self-pay

## 2021-03-24 ENCOUNTER — Ambulatory Visit: Payer: 59 | Attending: Neurology | Admitting: Occupational Therapy

## 2021-03-24 ENCOUNTER — Encounter: Payer: Self-pay | Admitting: Occupational Therapy

## 2021-03-24 DIAGNOSIS — M6281 Muscle weakness (generalized): Secondary | ICD-10-CM | POA: Insufficient documentation

## 2021-03-24 DIAGNOSIS — R278 Other lack of coordination: Secondary | ICD-10-CM | POA: Insufficient documentation

## 2021-03-24 DIAGNOSIS — M79641 Pain in right hand: Secondary | ICD-10-CM | POA: Diagnosis not present

## 2021-03-24 DIAGNOSIS — M25631 Stiffness of right wrist, not elsewhere classified: Secondary | ICD-10-CM | POA: Diagnosis not present

## 2021-03-24 DIAGNOSIS — M25641 Stiffness of right hand, not elsewhere classified: Secondary | ICD-10-CM | POA: Insufficient documentation

## 2021-03-24 NOTE — Therapy (Signed)
[facility] [address] [place], [place], [address] Phone: [phone]   Fax:  [phone]  Occupational Therapy Treatment  Patient Details  Name: Andrea Montes MRN: [id number] Date of Birth: February 09, 1956 Referring Provider (OT): Meredith Pel, MD   Encounter Date: 03/24/2021   OT End of Session - 03/24/21 0805     Visit Number 2    Number of Visits 10    Date for OT Re-Evaluation 04/20/21   2x/week for 3 weeks, 1x/week for 3 weeks = 9 visits over 8 weeks to allow for missed visits and/or scheduling conflicts   Authorization Type [place]    Authorization Time Period VL: 25    OT Start Time 0803    OT Stop Time 0845    OT Time Calculation (min) 42 min    Activity Tolerance Patient tolerated treatment well    Behavior During Therapy [facility] for tasks assessed/performed             Past Medical History:  Diagnosis Date   Anemia    no current med.   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   GERD (gastroesophageal reflux disease)    Heart murmur    last  echo- 2010   History of blood transfusion    History of shingles    Hypertension    borderline not on medicaiton at this time   Impingement syndrome of left shoulder region 10/2014   Joint pain    Migraines    Obesity    Pre-diabetes    Rheumatoid arteritis (HCC)    Seasonal allergies    Sleep apnea    uses CPAP nightly   Trigger finger, left middle finger    Vitamin  D deficiency    Wears partial dentures    upper    Past Surgical History:  Procedure Laterality Date   CARPAL TUNNEL RELEASE Left 04/25/2006   CARPAL TUNNEL RELEASE Right 07/15/2018   Procedure: RIGHT CARPAL TUNNEL RELEASE;  Surgeon: Meredith Pel, MD;  Location: [place];  Service: Orthopedics;  Laterality: Right;   CLOSED MANIPULATION SHOULDER WITH STERIOD INJECTION Left 02/03/2015   Procedure: LEFT  MANIPULATION SHOULDER UNDER ANESTHESIA WITH INJECTION OF STEROID;  Surgeon: Kathryne Hitch, MD;  Location: [place];  Service: Orthopedics;  Laterality: Left;   COLONOSCOPY     COLONOSCOPY WITH PROPOFOL  10/09/2011   POLYPECTOMY     RESECTION DISTAL CLAVICAL Left 11/04/2014   Procedure: RESECTION DISTAL CLAVICAL;  Surgeon: Kathryne Hitch, MD;  Location: [place];  Service: Orthopedics;  Laterality: Left;   ROTATOR CUFF REPAIR  2010   SHOULDER ARTHROSCOPY WITH ROTATOR CUFF REPAIR Right 10/04/2008   SHOULDER ARTHROSCOPY WITH SUBACROMIAL DECOMPRESSION, ROTATOR CUFF REPAIR AND BICEP TENDON REPAIR Left 11/04/2014   Procedure: LEFT SHOULDER ARTHROSCOPY WITH DEBRIDEMENT, DISTAL CLAVICLE EXCISION, ACROMIOPLASTY, ROTATOR CUFF REPAIR AND BICEP TENODESIS;  Surgeon: Quillian Quince  Percell Miller, MD;  Location: [place];  Service: Orthopedics;  Laterality: Left;   TRIGGER FINGER RELEASE Right 02/03/2015   Procedure: RIGHT LONG FINGER TRIGGER RELEASE;  Surgeon: Kathryne Hitch, MD;  Location: [place];  Service: Orthopedics;  Laterality: Right;   TRIGGER FINGER RELEASE Left 10/10/2017   Procedure: LEFT HAND RELEASE TRIGGER FINGER 3RD LONG FINGER AND CYST EXCISION;  Surgeon: Meredith Pel, MD;  Location: [place];  Service: Orthopedics;  Laterality: Left;   TRIGGER FINGER RELEASE Right 02/16/2021   Procedure: TRIGGER FINGER RELEASE RIGHT RING FINGER;  Surgeon: Meredith Pel, MD;  Location: [place];  Service: Orthopedics;  Laterality: Right;   TUBAL LIGATION      VAGINAL HYSTERECTOMY  2001   partial    There were no vitals filed for this visit.   Subjective Assessment - 03/24/21 0803     Subjective  "This is a little bit better but still really sensitive. I am still having phantom pain at night" Pt reports phantom pain as "tingling and throbbing"    Pertinent History PMH: DM, partial hysterectomy, RTC BUE (currently LUE shoulder pain waiting for MRI)    Limitations no lifitng over 2 lbs RUE    Patient Stated Goals "to be able to use my hand"    Currently in Pain? Yes    Pain Score 3     Pain Location Hand    Pain Orientation Right   palm of hand   Pain Descriptors / Indicators Throbbing;Tingling    Pain Type Surgical pain    Pain Onset 1 to 4 weeks ago    Pain Frequency Constant                          OT Treatments/Exercises (OP) - 03/24/21 0001       Modalities   Modalities Fluidotherapy;Ultrasound      Ultrasound   Ultrasound Location RUE volar side of hand    Ultrasound Parameters 0.8 w/cm2, 8 minutes, 55mz, continuous    Ultrasound Goals Edema;Pain      RUE Fluidotherapy   Number Minutes Fluidotherapy 12 Minutes    RUE Fluidotherapy Location Hand    Comments for stiffness, pain, desensitization of palm/surgical site                    OT Education - 03/24/21 0847     Education Details Added Passive to MCP flexion    Person(s) Educated Patient    Methods Explanation;Demonstration    Comprehension Verbalized understanding;Returned demonstration              OT Short Term Goals - 03/09/21 1744       OT SHORT TERM GOAL #1   Title Pt will be independent with initial HEP    Time 4    Period Weeks    Status New    Target Date 04/06/21      OT SHORT TERM GOAL #2   Title Pt will increase range of motion of RUE wrist flexion to 40 degrees or more for increase in functional use    Baseline 35*    Time 4    Period Weeks    Status New      OT SHORT TERM GOAL #3   Title Pt will  demonstrate scar massage and management techniques.    Time 4    Period Weeks    Status New      OT  SHORT TERM GOAL #4   Title Pt will increase composite flexion to 60% or greater to increase functional use of RUE    Baseline ~45%    Time 4    Period Weeks    Status New      OT SHORT TERM GOAL #5   Title Pt will report pain no greater than 3/10 while completing HEP    Baseline 4/10 at rest    Time 4    Period Weeks    Status New               OT Long Term Goals - 03/09/21 1747       OT LONG TERM GOAL #1   Title Pt will be independent with updated HEPs    Time 8    Period Weeks    Status New    Target Date 05/04/21      OT LONG TERM GOAL #2   Title Pt will increase composite flexion to 80% or greater in RUE    Baseline 45%    Time 8    Period Weeks    Status New      OT LONG TERM GOAL #3   Title Pt will demonstrate grip strength in RUE of 30 lbs or greater for demonstrating functional strength in RUE, dominant hand.    Baseline not tested at eval d/t precautions    Time 8    Period Weeks    Status New      OT LONG TERM GOAL #4   Title Pt will increase 9 hole peg test score with RUE by 3 seconds for improved coordination    Baseline R 32.09s (swiped pegs out to remove)    Time 8    Period Weeks    Status New      OT LONG TERM GOAL #5   Title Pt will demonstrate composite extension of 100% in RUE.    Baseline 90%    Time 8    Period Weeks    Status New                   Plan - 03/24/21 0848     Clinical Impression Statement Pt with improved desensitization in palm of RUE but continues with significant pain and stiffness.    OT Occupational Profile and History Problem Focused Assessment - Including review of records relating to presenting problem    Occupational performance deficits (Please refer to evaluation for details): IADL's;ADL's;Leisure;Work    Marketing executive / Function / Physical Skills ADL;Strength;Decreased knowledge of use of  DME;GMC;Pain;Dexterity;UE functional use;ROM;IADL;Wound;Sensation;Flexibility;Coordination;FMC;Decreased knowledge of precautions    Rehab Potential Good    Clinical Decision Making Limited treatment options, no task modification necessary    Comorbidities Affecting Occupational Performance: None    Modification or Assistance to Complete Evaluation  No modification of tasks or assist necessary to complete eval    OT Frequency 2x / week   2x/week for 3 weeks, 1x/week for 3 weeks   OT Duration 8 weeks   9 visits over 8 weeks d/t scheduling and potential missed visits   OT Treatment/Interventions Moist Heat;Fluidtherapy;Manual Therapy;Paraffin;Electrical Stimulation;Self-care/ADL training;Therapeutic exercise;Ultrasound;Cryotherapy;Patient/family education;Passive range of motion;Scar mobilization;Therapeutic activities;DME and/or AE instruction;Splinting    Plan scar massage, fluido?, see how ultrasound went    OT Home Exercise Plan AROM finger    Consulted and Agree with Plan of Care Patient             Patient will benefit from skilled  therapeutic intervention in order to improve the following deficits and impairments:   Body Structure / Function / Physical Skills: ADL, Strength, Decreased knowledge of use of DME, GMC, Pain, Dexterity, UE functional use, ROM, IADL, Wound, Sensation, Flexibility, Coordination, FMC, Decreased knowledge of precautions       Visit Diagnosis: Stiffness of right hand, not elsewhere classified  Pain in right hand  Other lack of coordination  Muscle weakness (generalized)  Stiffness of right wrist, not elsewhere classified    Problem List Patient Active Problem List   Diagnosis Date Noted   Type 2 diabetes mellitus without complication, without long-term current use of insulin ([place]) 10/01/2018   Trigger finger, right ring finger 09/09/2015   Low back pain radiating to both legs 05/06/2012   Leg length discrepancy 05/06/2012   HYPERTENSION, BENIGN  04/11/2009   LEG EDEMA, BILATERAL 10/08/2008   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 10/17/2006   Carpal tunnel syndrome, right upper limb 10/17/2006    Zachery Conch MOT, OTR/L  03/24/2021, 8:49 AM  [facility] [address] [place], [place], [address] Phone: [phone]   Fax:  [phone]  Name: MARTENA KELTNER MRN: [id number] Date of Birth: [birth year]

## 2021-03-27 ENCOUNTER — Ambulatory Visit: Payer: 59 | Admitting: Occupational Therapy

## 2021-03-27 ENCOUNTER — Telehealth: Payer: Self-pay | Admitting: Orthopedic Surgery

## 2021-03-27 ENCOUNTER — Other Ambulatory Visit: Payer: Self-pay

## 2021-03-27 DIAGNOSIS — M6281 Muscle weakness (generalized): Secondary | ICD-10-CM

## 2021-03-27 DIAGNOSIS — R278 Other lack of coordination: Secondary | ICD-10-CM

## 2021-03-27 DIAGNOSIS — M79641 Pain in right hand: Secondary | ICD-10-CM

## 2021-03-27 DIAGNOSIS — M25641 Stiffness of right hand, not elsewhere classified: Secondary | ICD-10-CM

## 2021-03-27 DIAGNOSIS — M25631 Stiffness of right wrist, not elsewhere classified: Secondary | ICD-10-CM

## 2021-03-27 NOTE — Telephone Encounter (Signed)
Received medical records release form from patient/ Forwarding to [employer] today 

## 2021-03-27 NOTE — Therapy (Signed)
[facility] [address] [place], [place], [address] Phone: [phone]   Fax:  [phone]  Occupational Therapy Treatment  Patient Details  Name: Andrea Montes MRN: [id number] Date of Birth: 1956-02-05 Referring Provider (OT): Meredith Pel, MD   Encounter Date: 03/27/2021   OT End of Session - 03/27/21 0732     Visit Number 3    Number of Visits 10    Date for OT Re-Evaluation 04/20/21   2x/week for 3 weeks, 1x/week for 3 weeks = 9 visits over 8 weeks to allow for missed visits and/or scheduling conflicts   Authorization Type [place]    Authorization Time Period VL: 25    OT Start Time 0725    OT Stop Time 0803    OT Time Calculation (min) 38 min    Activity Tolerance Patient tolerated treatment well    Behavior During Therapy [facility] for tasks assessed/performed             Past Medical History:  Diagnosis Date   Anemia    no current med.   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   GERD (gastroesophageal reflux disease)    Heart murmur    last  echo- 2010   History of blood transfusion    History of shingles    Hypertension    borderline not on medicaiton at this time   Impingement syndrome of left shoulder region 10/2014   Joint pain    Migraines    Obesity    Pre-diabetes    Rheumatoid arteritis (HCC)    Seasonal allergies    Sleep apnea    uses CPAP nightly   Trigger finger, left middle finger    Vitamin  D deficiency    Wears partial dentures    upper    Past Surgical History:  Procedure Laterality Date   CARPAL TUNNEL RELEASE Left 04/25/2006   CARPAL TUNNEL RELEASE Right 07/15/2018   Procedure: RIGHT CARPAL TUNNEL RELEASE;  Surgeon: Meredith Pel, MD;  Location: [facility];  Service: Orthopedics;  Laterality: Right;   CLOSED MANIPULATION SHOULDER WITH STERIOD INJECTION Left 02/03/2015   Procedure: LEFT  MANIPULATION SHOULDER UNDER ANESTHESIA WITH INJECTION OF STEROID;  Surgeon: Kathryne Hitch, MD;  Location: [place];  Service: Orthopedics;  Laterality: Left;   COLONOSCOPY     COLONOSCOPY WITH PROPOFOL  10/09/2011   POLYPECTOMY     RESECTION DISTAL CLAVICAL Left 11/04/2014   Procedure: RESECTION DISTAL CLAVICAL;  Surgeon: Kathryne Hitch, MD;  Location: [place];  Service: Orthopedics;  Laterality: Left;   ROTATOR CUFF REPAIR  2010   SHOULDER ARTHROSCOPY WITH ROTATOR CUFF REPAIR Right 10/04/2008   SHOULDER ARTHROSCOPY WITH SUBACROMIAL DECOMPRESSION, ROTATOR CUFF REPAIR AND BICEP TENDON REPAIR Left 11/04/2014   Procedure: LEFT SHOULDER ARTHROSCOPY WITH DEBRIDEMENT, DISTAL CLAVICLE EXCISION, ACROMIOPLASTY, ROTATOR CUFF REPAIR AND BICEP TENODESIS;  Surgeon: Quillian Quince  Percell Miller, MD;  Location: [facility];  Service: Orthopedics;  Laterality: Left;   TRIGGER FINGER RELEASE Right 02/03/2015   Procedure: RIGHT LONG FINGER TRIGGER RELEASE;  Surgeon: Kathryne Hitch, MD;  Location: [facility];  Service: Orthopedics;  Laterality: Right;   TRIGGER FINGER RELEASE Left 10/10/2017   Procedure: LEFT HAND RELEASE TRIGGER FINGER 3RD LONG FINGER AND CYST EXCISION;  Surgeon: Meredith Pel, MD;  Location: [facility];  Service: Orthopedics;  Laterality: Left;   TRIGGER FINGER RELEASE Right 02/16/2021   Procedure: TRIGGER FINGER RELEASE RIGHT RING FINGER;  Surgeon: Meredith Pel, MD;  Location: [place];  Service: Orthopedics;  Laterality: Right;   TUBAL LIGATION      VAGINAL HYSTERECTOMY  2001   partial    There were no vitals filed for this visit.   Subjective Assessment - 03/27/21 0731     Subjective  "No pain, just stiff this morning.  It's getting there"    Pertinent History PMH: DM, partial hysterectomy, RTC BUE (currently LUE shoulder pain waiting for MRI)    Limitations no lifitng over 2 lbs RUE    Patient Stated Goals "to be able to use my hand"    Currently in Pain? No/denies    Pain Onset 1 to 4 weeks ago                Fludio x72mn to R hand with no adverse reactions for pain, stiffness, and desensitization.  Ultrasound x871m to R palm incision site, 76m65m 0.8wts/cm2, 20% pulsed with no adverse reactions for scar tissue.    Tendon glides, isolated/blocked MP, PIP, and DIP flex/ext.  Pt reports 4/10 pain with exercise.  Composite flex/ext PROM followed by light desensitization with towel and scar massage as tolerated.  (Reviewed how to do at home)        OT Short Term Goals - 03/09/21 1744       OT SHORT TERM GOAL #1   Title Pt will be independent with initial HEP    Time 4    Period Weeks    Status New    Target Date 04/06/21      OT SHORT TERM GOAL #2   Title Pt will increase range of motion of RUE wrist flexion to 40 degrees or more for increase in functional use    Baseline 35*    Time 4    Period Weeks    Status New      OT SHORT TERM GOAL #3   Title Pt will demonstrate scar massage and management techniques.    Time 4    Period Weeks    Status New      OT SHORT TERM GOAL #4   Title Pt will increase composite flexion to 60% or greater to increase functional use of RUE    Baseline ~45%    Time 4    Period Weeks    Status New      OT SHORT TERM GOAL #5   Title Pt will report pain no greater than 3/10 while completing HEP    Baseline 4/10 at rest    Time 4    Period Weeks    Status New               OT Long Term Goals - 03/09/21 1747       OT LONG TERM GOAL #1   Title Pt will be  independent with updated HEPs    Time 8  Period Weeks    Status New    Target Date 05/04/21      OT LONG TERM GOAL #2   Title Pt will increase composite flexion to 80% or greater in RUE    Baseline 45%    Time 8    Period Weeks    Status New      OT LONG TERM GOAL #3   Title Pt will demonstrate grip strength in RUE of 30 lbs or greater for demonstrating functional strength in RUE, dominant hand.    Baseline not tested at eval d/t precautions    Time 8    Period Weeks    Status New      OT LONG TERM GOAL #4   Title Pt will increase 9 hole peg test score with RUE by 3 seconds for improved coordination    Baseline R 32.09s (swiped pegs out to remove)    Time 8    Period Weeks    Status New      OT LONG TERM GOAL #5   Title Pt will demonstrate composite extension of 100% in RUE.    Baseline 90%    Time 8    Period Weeks    Status New                   Plan - 03/27/21 0732     Clinical Impression Statement Pt demo decr sensitivity at incision site and improving ROM.  Incr scar mobility after ultrasound also noted.    OT Occupational Profile and History Problem Focused Assessment - Including review of records relating to presenting problem    Occupational performance deficits (Please refer to evaluation for details): IADL's;ADL's;Leisure;Work    Marketing executive / Function / Physical Skills ADL;Strength;Decreased knowledge of use of DME;GMC;Pain;Dexterity;UE functional use;ROM;IADL;Wound;Sensation;Flexibility;Coordination;FMC;Decreased knowledge of precautions    Rehab Potential Good    Clinical Decision Making Limited treatment options, no task modification necessary    Comorbidities Affecting Occupational Performance: None    Modification or Assistance to Complete Evaluation  No modification of tasks or assist necessary to complete eval    OT Frequency 2x / week   2x/week for 3 weeks, 1x/week for 3 weeks   OT Duration 8 weeks   9 visits over 8 weeks d/t scheduling  and potential missed visits   OT Treatment/Interventions Moist Heat;Fluidtherapy;Manual Therapy;Paraffin;Electrical Stimulation;Self-care/ADL training;Therapeutic exercise;Ultrasound;Cryotherapy;Patient/family education;Passive range of motion;Scar mobilization;Therapeutic activities;DME and/or AE instruction;Splinting    Plan Fludio, ultrasound, ROM, scar massage    OT Home Exercise Plan AROM finger    Consulted and Agree with Plan of Care Patient             Patient will benefit from skilled therapeutic intervention in order to improve the following deficits and impairments:   Body Structure / Function / Physical Skills: ADL, Strength, Decreased knowledge of use of DME, GMC, Pain, Dexterity, UE functional use, ROM, IADL, Wound, Sensation, Flexibility, Coordination, FMC, Decreased knowledge of precautions       Visit Diagnosis: Stiffness of right hand, not elsewhere classified  Pain in right hand  Other lack of coordination  Muscle weakness (generalized)  Stiffness of right wrist, not elsewhere classified    Problem List Patient Active Problem List   Diagnosis Date Noted   Type 2 diabetes mellitus without complication, without long-term current use of insulin ([place]) 10/01/2018   Trigger finger, right ring finger 09/09/2015   Low back pain radiating to both legs 05/06/2012   Leg length discrepancy 05/06/2012  HYPERTENSION, BENIGN 04/11/2009   LEG EDEMA, BILATERAL 10/08/2008   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 10/17/2006   Carpal tunnel syndrome, right upper limb 10/17/2006    [facility] 03/27/2021, 8:45 AM  [facility] [address] [place], [place], [address] Phone: [phone]   Fax:  [phone]  Name: Andrea Montes MRN: [id number] Date of Birth:  1955-12-23  Vianne Bulls, OTR/L [facility] [address] [place], [place]  [address] [phone] phone [phone] 03/27/21 8:45 AM

## 2021-03-28 ENCOUNTER — Ambulatory Visit
Admission: RE | Admit: 2021-03-28 | Discharge: 2021-03-28 | Disposition: A | Discharge status: Home / Self Care | Payer: 59 | Source: Ambulatory Visit | Attending: Orthopedic Surgery | Admitting: Orthopedic Surgery

## 2021-03-28 ENCOUNTER — Ambulatory Visit (INDEPENDENT_AMBULATORY_CARE_PROVIDER_SITE_OTHER): Payer: 59 | Admitting: Podiatry

## 2021-03-28 DIAGNOSIS — E1142 Type 2 diabetes mellitus with diabetic polyneuropathy: Secondary | ICD-10-CM | POA: Diagnosis not present

## 2021-03-28 DIAGNOSIS — M25612 Stiffness of left shoulder, not elsewhere classified: Secondary | ICD-10-CM | POA: Diagnosis not present

## 2021-03-28 DIAGNOSIS — S46012A Strain of muscle(s) and tendon(s) of the rotator cuff of left shoulder, initial encounter: Secondary | ICD-10-CM | POA: Diagnosis not present

## 2021-03-28 DIAGNOSIS — E1169 Type 2 diabetes mellitus with other specified complication: Secondary | ICD-10-CM

## 2021-03-28 DIAGNOSIS — M25512 Pain in left shoulder: Secondary | ICD-10-CM

## 2021-03-28 DIAGNOSIS — M722 Plantar fascial fibromatosis: Secondary | ICD-10-CM | POA: Diagnosis not present

## 2021-03-28 DIAGNOSIS — B351 Tinea unguium: Secondary | ICD-10-CM | POA: Diagnosis not present

## 2021-03-28 MED ORDER — IOPAMIDOL (ISOVUE-M 200) INJECTION 41%
15.0000 mL | Freq: Once | INTRAMUSCULAR | Status: AC
  Administered 2021-03-28: 15 mL via INTRA_ARTICULAR

## 2021-03-28 NOTE — Progress Notes (Signed)
  Subjective:  Patient ID: KEYIRA STAHL, female    DOB: [birth year],  MRN: [id number]  Chief Complaint  Patient presents with   Nail Problem    RFC nail trim bilateral nail 1-5    Foot Orthotics    Pt needs replacement foot orthotics last pair 2020   65 y.o. female presents with the above complaint. History confirmed with patient.   Objective:  Physical Exam: warm, good capillary refill, nail exam onychomycosis of the toenails, trophic skin changes absent pedal hair, no ulcerative lesions. DP pulses palpable, PT pulses palpable, and protective sensation intact. Pes planus noted. Xerosis noted bilat No images are attached to the encounter.  Assessment:   1. Onychomycosis of multiple toenails with type 2 diabetes mellitus and peripheral neuropathy ([place])   2. Plantar fasciitis    Plan:  Patient was evaluated and treated and all questions answered.  Onychomycosis and Diabetes -Patient is diabetic with a qualifying condition for at risk foot care.  Procedure: Nail Debridement Type of Debridement: manual, sharp debridement. Instrumentation: Nail nipper, rotary burr. Number of Nails: 10  Pes Planus, hx of plantar fasciitis. -Casted for new CMOs  No follow-ups on file.

## 2021-03-28 NOTE — Patient Instructions (Signed)

## 2021-03-30 ENCOUNTER — Telehealth: Payer: Self-pay | Admitting: Orthopedic Surgery

## 2021-03-30 NOTE — Telephone Encounter (Signed)
[place] forms received. To [employer]. ?

## 2021-03-31 ENCOUNTER — Ambulatory Visit: Payer: 59 | Admitting: Occupational Therapy

## 2021-03-31 ENCOUNTER — Encounter: Payer: Self-pay | Admitting: Orthopedic Surgery

## 2021-03-31 ENCOUNTER — Other Ambulatory Visit: Payer: Self-pay

## 2021-03-31 DIAGNOSIS — M6281 Muscle weakness (generalized): Secondary | ICD-10-CM

## 2021-03-31 DIAGNOSIS — R278 Other lack of coordination: Secondary | ICD-10-CM | POA: Diagnosis not present

## 2021-03-31 DIAGNOSIS — M25631 Stiffness of right wrist, not elsewhere classified: Secondary | ICD-10-CM

## 2021-03-31 DIAGNOSIS — M79641 Pain in right hand: Secondary | ICD-10-CM | POA: Diagnosis not present

## 2021-03-31 DIAGNOSIS — M25641 Stiffness of right hand, not elsewhere classified: Secondary | ICD-10-CM | POA: Diagnosis not present

## 2021-03-31 NOTE — Therapy (Signed)
[facility] [address] [place], [place], [address] Phone: [phone]   Fax:  [phone]  Occupational Therapy Treatment  Patient Details  Name: TALISIA MUNZER MRN: [id number] Date of Birth: Jul 16, 1956 Referring Provider (OT): Meredith Pel, MD   Encounter Date: 03/31/2021   OT End of Session - 03/31/21 0933     Visit Number 4    Number of Visits 10    Date for OT Re-Evaluation 04/20/21   2x/week for 3 weeks, 1x/week for 3 weeks = 9 visits over 8 weeks to allow for missed visits and/or scheduling conflicts   Authorization Type [place]    Authorization Time Period VL: 25    OT Start Time 0932    OT Stop Time 1014    OT Time Calculation (min) 42 min    Activity Tolerance Patient tolerated treatment well    Behavior During Therapy [facility] for tasks assessed/performed             Past Medical History:  Diagnosis Date   Anemia    no current med.   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   GERD (gastroesophageal reflux disease)    Heart murmur    last  echo- 2010   History of blood transfusion    History of shingles    Hypertension    borderline not on medicaiton at this time   Impingement syndrome of left shoulder region 10/2014   Joint pain    Migraines    Obesity    Pre-diabetes    Rheumatoid arteritis (HCC)    Seasonal allergies    Sleep apnea    uses CPAP nightly   Trigger finger, left middle finger     Vitamin D deficiency    Wears partial dentures    upper    Past Surgical History:  Procedure Laterality Date   CARPAL TUNNEL RELEASE Left 04/25/2006   CARPAL TUNNEL RELEASE Right 07/15/2018   Procedure: RIGHT CARPAL TUNNEL RELEASE;  Surgeon: Meredith Pel, MD;  Location: [place];  Service: Orthopedics;  Laterality: Right;   CLOSED MANIPULATION SHOULDER WITH STERIOD INJECTION Left 02/03/2015   Procedure: LEFT  MANIPULATION SHOULDER UNDER ANESTHESIA WITH INJECTION OF STEROID;  Surgeon: Kathryne Hitch, MD;  Location: [facility];  Service: Orthopedics;  Laterality: Left;   COLONOSCOPY     COLONOSCOPY WITH PROPOFOL  10/09/2011   POLYPECTOMY     RESECTION DISTAL CLAVICAL Left 11/04/2014   Procedure: RESECTION DISTAL CLAVICAL;  Surgeon: Kathryne Hitch, MD;  Location: [place];  Service: Orthopedics;  Laterality: Left;   ROTATOR CUFF REPAIR  2010   SHOULDER ARTHROSCOPY WITH ROTATOR CUFF REPAIR Right 10/04/2008   SHOULDER ARTHROSCOPY WITH SUBACROMIAL DECOMPRESSION, ROTATOR CUFF REPAIR AND BICEP TENDON REPAIR Left 11/04/2014   Procedure: LEFT SHOULDER ARTHROSCOPY WITH DEBRIDEMENT, DISTAL CLAVICLE EXCISION, ACROMIOPLASTY, ROTATOR CUFF REPAIR AND BICEP TENODESIS;  Surgeon: Quillian Quince  Percell Miller, MD;  Location: [facility];  Service: Orthopedics;  Laterality: Left;   TRIGGER FINGER RELEASE Right 02/03/2015   Procedure: RIGHT LONG FINGER TRIGGER RELEASE;  Surgeon: Kathryne Hitch, MD;  Location: [facility];  Service: Orthopedics;  Laterality: Right;   TRIGGER FINGER RELEASE Left 10/10/2017   Procedure: LEFT HAND RELEASE TRIGGER FINGER 3RD LONG FINGER AND CYST EXCISION;  Surgeon: Meredith Pel, MD;  Location: [place];  Service: Orthopedics;  Laterality: Left;   TRIGGER FINGER RELEASE Right 02/16/2021   Procedure: TRIGGER FINGER RELEASE RIGHT RING FINGER;  Surgeon: Meredith Pel, MD;  Location: [place];  Service: Orthopedics;  Laterality: Right;   TUBAL  LIGATION     VAGINAL HYSTERECTOMY  2001   partial    There were no vitals filed for this visit.   Subjective Assessment - 03/31/21 0932     Subjective  " a little tender pain but other than that good" Pt concerned with LUE shoulder MRI results - has not spoked to MD yet.    Pertinent History PMH: DM, partial hysterectomy, RTC BUE (currently LUE shoulder pain waiting for MRI)    Limitations no lifitng over 2 lbs RUE    Patient Stated Goals "to be able to use my hand"    Currently in Pain? Yes    Pain Score 3     Pain Location Hand    Pain Orientation Right   palm   Pain Descriptors / Indicators Throbbing;Tingling    Pain Type Surgical pain    Pain Onset 1 to 4 weeks ago    Pain Frequency Constant                Fludio x12 min to R hand with no adverse reactions for pain, stiffness, and desensitization.   Ultrasound x 10mn to R palm incision site, 335m, 0.8wts/cm2, 20% pulsed with no adverse reactions for scar tissue.    PROM to RUE 4th digit at DIP, PIP and MCP  Composite flexion and extension AROM of RUE - Pt achieved approx 90% flexion with exercises and after fluido and ultrasound.  Tendon Glides RUE x 5              OT Short Term Goals - 03/31/21 0936       OT SHORT TERM GOAL #1   Title Pt will be independent with initial HEP    Time 4    Period Weeks    Status On-going    Target Date 04/06/21      OT SHORT TERM GOAL #2   Title Pt will increase range of motion of RUE wrist flexion to 40 degrees or more for increase in functional use    Baseline 35*    Time 4    Period Weeks    Status New      OT SHORT TERM GOAL #3   Title Pt will demonstrate scar massage and management techniques.    Time 4    Period Weeks    Status Achieved      OT SHORT TERM GOAL #4   Title Pt will increase composite flexion to 60% or greater to increase functional use of RUE    Baseline ~45%    Time 4    Period Weeks    Status On-going      OT SHORT TERM GOAL #5    Title Pt will report pain no greater than 3/10 while completing HEP    Baseline 4/10 at  rest    Time 4    Period Weeks    Status New               OT Long Term Goals - 03/09/21 1747       OT LONG TERM GOAL #1   Title Pt will be independent with updated HEPs    Time 8    Period Weeks    Status New    Target Date 05/04/21      OT LONG TERM GOAL #2   Title Pt will increase composite flexion to 80% or greater in RUE    Baseline 45%    Time 8    Period Weeks    Status New      OT LONG TERM GOAL #3   Title Pt will demonstrate grip strength in RUE of 30 lbs or greater for demonstrating functional strength in RUE, dominant hand.    Baseline not tested at eval d/t precautions    Time 8    Period Weeks    Status New      OT LONG TERM GOAL #4   Title Pt will increase 9 hole peg test score with RUE by 3 seconds for improved coordination    Baseline R 32.09s (swiped pegs out to remove)    Time 8    Period Weeks    Status New      OT LONG TERM GOAL #5   Title Pt will demonstrate composite extension of 100% in RUE.    Baseline 90%    Time 8    Period Weeks    Status New                   Plan - 03/31/21 [id number]     Clinical Impression Statement Pt progressing slowly with range of motion and use of RUE. Pt with good results from fluido and ultrasound with reaching approx 90% composite flexion.    OT Occupational Profile and History Problem Focused Assessment - Including review of records relating to presenting problem    Occupational performance deficits (Please refer to evaluation for details): IADL's;ADL's;Leisure;Work    Marketing executive / Function / Physical Skills ADL;Strength;Decreased knowledge of use of DME;GMC;Pain;Dexterity;UE functional use;ROM;IADL;Wound;Sensation;Flexibility;Coordination;FMC;Decreased knowledge of precautions    Rehab Potential Good    Clinical Decision Making Limited treatment options, no task modification necessary    Comorbidities  Affecting Occupational Performance: None    Modification or Assistance to Complete Evaluation  No modification of tasks or assist necessary to complete eval    OT Frequency 2x / week   2x/week for 3 weeks, 1x/week for 3 weeks   OT Duration 8 weeks   9 visits over 8 weeks d/t scheduling and potential missed visits   OT Treatment/Interventions Moist Heat;Fluidtherapy;Manual Therapy;Paraffin;Electrical Stimulation;Self-care/ADL training;Therapeutic exercise;Ultrasound;Cryotherapy;Patient/family education;Passive range of motion;Scar mobilization;Therapeutic activities;DME and/or AE instruction;Splinting    Plan Fludio, ultrasound, ROM, scar massage, start checking goals to see where she is, don't anticipate going all scheduled visits    OT Home Exercise Plan AROM finger    Consulted and Agree with Plan of Care Patient             Patient will benefit from skilled therapeutic intervention in order to improve the following deficits and impairments:   Body Structure / Function / Physical Skills: ADL, Strength, Decreased knowledge of use of DME, GMC, Pain, Dexterity, UE functional use, ROM, IADL, Wound, Sensation, Flexibility, Coordination, FMC, Decreased knowledge of precautions  Visit Diagnosis: Stiffness of right hand, not elsewhere classified  Pain in right hand  Other lack of coordination  Stiffness of right wrist, not elsewhere classified  Muscle weakness (generalized)    Problem List Patient Active Problem List   Diagnosis Date Noted   Type 2 diabetes mellitus without complication, without long-term current use of insulin ([place]) 10/01/2018   Trigger finger, right ring finger 09/09/2015   Low back pain radiating to both legs 05/06/2012   Leg length discrepancy 05/06/2012   HYPERTENSION, BENIGN 04/11/2009   LEG EDEMA, BILATERAL 10/08/2008   CHRONIC MIGRAINE W/O AURA W/INTRACTABLE W/SM 03/09/2008   OVARIAN FAILURE 09/02/2007   VITAMIN D DEFICIENCY 09/02/2007   OBESITY,  NOS 10/17/2006   ANEMIA, IRON DEFICIENCY, UNSPEC. 10/17/2006   Carpal tunnel syndrome, right upper limb 10/17/2006    Zachery Conch MOT, OTR/L  03/31/2021, 10:15 AM  [facility] [address] [place], [place], [address] Phone: [phone]   Fax:  [phone]  Name: PRESCIOUS SUCHER MRN: [id number] Date of Birth: [birth year]

## 2021-04-02 NOTE — Telephone Encounter (Signed)
I called and talked to her.  Would you mind getting her set up to come in sometime next week for a shoulder injection and then we will set her up for physical therapy after that the following week.  Thanks in general she has a very small punctate type of tear and it does not look like it will necessarily require surgery right now.

## 2021-04-03 ENCOUNTER — Telehealth: Payer: Self-pay

## 2021-04-03 NOTE — Telephone Encounter (Signed)
Contacted patient and scheduled OV appointment on 04/07/21 at 10:00AM to discuss MRI of left shoulder and further treatment plan.

## 2021-04-04 ENCOUNTER — Other Ambulatory Visit: Payer: Self-pay

## 2021-04-04 ENCOUNTER — Ambulatory Visit: Payer: 59 | Admitting: Occupational Therapy

## 2021-04-04 ENCOUNTER — Encounter: Payer: Self-pay | Admitting: Occupational Therapy

## 2021-04-04 DIAGNOSIS — M25631 Stiffness of right wrist, not elsewhere classified: Secondary | ICD-10-CM | POA: Diagnosis not present

## 2021-04-04 DIAGNOSIS — M25641 Stiffness of right hand, not elsewhere classified: Secondary | ICD-10-CM | POA: Diagnosis not present

## 2021-04-04 DIAGNOSIS — M6281 Muscle weakness (generalized): Secondary | ICD-10-CM | POA: Diagnosis not present

## 2021-04-04 DIAGNOSIS — M79641 Pain in right hand: Secondary | ICD-10-CM

## 2021-04-04 DIAGNOSIS — R278 Other lack of coordination: Secondary | ICD-10-CM

## 2021-04-04 NOTE — Therapy (Signed)
[facility] [address] [place], [place], [address] Phone: [phone]   Fax:  [phone]  Occupational Therapy Treatment  Patient Details  Name: Andrea Montes MRN: [id number] Date of Birth: 17-Jan-1956 Referring Provider (OT): Meredith Pel, MD   Encounter Date: 04/04/2021   OT End of Session - 04/04/21 0809     Visit Number 5    Number of Visits 10    Date for OT Re-Evaluation 04/20/21   2x/week for 3 weeks, 1x/week for 3 weeks = 9 visits over 8 weeks to allow for missed visits and/or scheduling conflicts   Authorization Type [place]    Authorization Time Period VL: 25    OT Start Time 0805    OT Stop Time 0845    OT Time Calculation (min) 40 min    Activity Tolerance Patient tolerated treatment well    Behavior During Therapy [facility] for tasks assessed/performed             Past Medical History:  Diagnosis Date   Anemia    no current med.   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   GERD (gastroesophageal reflux disease)    Heart murmur    last  echo- 2010   History of blood transfusion    History of shingles    Hypertension    borderline not on medicaiton at this time   Impingement syndrome of left shoulder region 10/2014   Joint pain    Migraines    Obesity    Pre-diabetes    Rheumatoid arteritis (HCC)    Seasonal allergies    Sleep apnea    uses CPAP nightly   Trigger finger, left middle finger     Vitamin D deficiency    Wears partial dentures    upper    Past Surgical History:  Procedure Laterality Date   CARPAL TUNNEL RELEASE Left 04/25/2006   CARPAL TUNNEL RELEASE Right 07/15/2018   Procedure: RIGHT CARPAL TUNNEL RELEASE;  Surgeon: Meredith Pel, MD;  Location: [facility];  Service: Orthopedics;  Laterality: Right;   CLOSED MANIPULATION SHOULDER WITH STERIOD INJECTION Left 02/03/2015   Procedure: LEFT  MANIPULATION SHOULDER UNDER ANESTHESIA WITH INJECTION OF STEROID;  Surgeon: Kathryne Hitch, MD;  Location: [facility];  Service: Orthopedics;  Laterality: Left;   COLONOSCOPY     COLONOSCOPY WITH PROPOFOL  10/09/2011   POLYPECTOMY     RESECTION DISTAL CLAVICAL Left 11/04/2014   Procedure: RESECTION DISTAL CLAVICAL;  Surgeon: Kathryne Hitch, MD;  Location: [facility];  Service: Orthopedics;  Laterality: Left;   ROTATOR CUFF REPAIR  2010   SHOULDER ARTHROSCOPY WITH ROTATOR CUFF REPAIR Right 10/04/2008   SHOULDER ARTHROSCOPY WITH SUBACROMIAL DECOMPRESSION, ROTATOR CUFF REPAIR AND BICEP TENDON REPAIR Left 11/04/2014   Procedure: LEFT SHOULDER ARTHROSCOPY WITH DEBRIDEMENT, DISTAL CLAVICLE EXCISION, ACROMIOPLASTY, ROTATOR CUFF REPAIR AND BICEP TENODESIS;  Surgeon: Quillian Quince  Percell Miller, MD;  Location: [facility];  Service: Orthopedics;  Laterality: Left;   TRIGGER FINGER RELEASE Right 02/03/2015   Procedure: RIGHT LONG FINGER TRIGGER RELEASE;  Surgeon: Kathryne Hitch, MD;  Location: [facility];  Service: Orthopedics;  Laterality: Right;   TRIGGER FINGER RELEASE Left 10/10/2017   Procedure: LEFT HAND RELEASE TRIGGER FINGER 3RD LONG FINGER AND CYST EXCISION;  Surgeon: Meredith Pel, MD;  Location: [facility];  Service: Orthopedics;  Laterality: Left;   TRIGGER FINGER RELEASE Right 02/16/2021   Procedure: TRIGGER FINGER RELEASE RIGHT RING FINGER;  Surgeon: Meredith Pel, MD;  Location: [facility];  Service: Orthopedics;  Laterality: Right;   TUBAL  LIGATION     VAGINAL HYSTERECTOMY  2001   partial    There were no vitals filed for this visit.   Subjective Assessment - 04/04/21 0807     Subjective  just stiff    Pertinent History PMH: DM, partial hysterectomy, RTC BUE (currently LUE shoulder pain waiting for MRI)    Limitations no lifitng over 2 lbs RUE    Patient Stated Goals "to be able to use my hand"    Currently in Pain? Yes    Pain Score 3     Pain Location Hand    Pain Orientation Right    Pain Descriptors / Indicators Throbbing;Aching    Pain Type Acute pain    Pain Onset 1 to 4 weeks ago    Pain Frequency Intermittent    Aggravating Factors  with exercise    Pain Relieving Factors rest, heat                  Fludio x8 min to R hand with no adverse reactions for pain, stiffness, and desensitization.   Ultrasound x8 min to R palm incision site, 36mz, 0.8wts/cm2, 20% pulsed with no adverse reactions for scar tissue.     AROM:  Tendon glides    PROM:  Composite flex/ext PROM, isolated MP flex/ext, isolated PIP flex/ext, isolated DIP flex/ext, isolated IP flex/ext      OT Education - 04/04/21 0841     Education Details PROM HEP--see pt instructions    Person(s) Educated Patient    Methods Explanation;Demonstration    Comprehension Verbalized understanding;Returned demonstration              OT Short Term Goals - 04/04/21 0836       OT SHORT TERM GOAL #1   Title Pt will be independent with initial HEP    Time 4    Period Weeks    Status On-going    Target Date 04/06/21      OT SHORT TERM GOAL #2   Title Pt will increase range of motion of RUE wrist flexion to 40 degrees or more for increase in functional use    Baseline 35*    Time 4    Period Weeks    Status Achieved   04/04/21:  60*     OT SHORT TERM GOAL #3   Title Pt will demonstrate scar massage and management techniques.    Time 4    Period Weeks    Status Achieved      OT SHORT TERM GOAL #4   Title Pt will increase  composite flexion to 60% or greater to increase functional use of RUE    Baseline ~45%    Time 4    Period Weeks    Status On-going   04/04/21:  90-95% after  stretching, approx 50% prior to stretching     OT SHORT TERM GOAL #5   Title Pt will report pain no greater than 3/10 while completing HEP    Baseline 4/10 at rest    Time 4    Period Weeks    Status On-going   04/04/21:  3-4/10              OT Long Term Goals - 03/09/21 1747       OT LONG TERM GOAL #1   Title Pt will be independent with updated HEPs    Time 8    Period Weeks    Status New    Target Date 05/04/21      OT LONG TERM GOAL #2   Title Pt will increase composite flexion to 80% or greater in RUE    Baseline 45%    Time 8    Period Weeks    Status New      OT LONG TERM GOAL #3   Title Pt will demonstrate grip strength in RUE of 30 lbs or greater for demonstrating functional strength in RUE, dominant hand.    Baseline not tested at eval d/t precautions    Time 8    Period Weeks    Status New      OT LONG TERM GOAL #4   Title Pt will increase 9 hole peg test score with RUE by 3 seconds for improved coordination    Baseline R 32.09s (swiped pegs out to remove)    Time 8    Period Weeks    Status New      OT LONG TERM GOAL #5   Title Pt will demonstrate composite extension of 100% in RUE.    Baseline 90%    Time 8    Period Weeks    Status New                   Plan - 04/04/21 0809     Clinical Impression Statement Pt is progressing with improved ROM with 90-95% after stretching/heat/ultrasound, but continues to have stiffness between exercises/sessions.  Pt also progressing with decr sensitivity at incision site.    OT Occupational Profile and History Problem Focused Assessment - Including review of records relating to presenting problem    Occupational performance deficits (Please refer to evaluation for details): IADL's;ADL's;Leisure;Work    Marketing executive / Function / Physical  Skills ADL;Strength;Decreased knowledge of use of DME;GMC;Pain;Dexterity;UE functional use;ROM;IADL;Wound;Sensation;Flexibility;Coordination;FMC;Decreased knowledge of precautions    Rehab Potential Good    Clinical Decision Making Limited treatment options, no task modification necessary    Comorbidities Affecting Occupational Performance: None    Modification or Assistance to Complete Evaluation  No modification of tasks or assist necessary to complete eval    OT Frequency 2x / week   2x/week for 3 weeks, 1x/week for 3 weeks   OT Duration 8 weeks   9 visits over 8 weeks d/t scheduling and potential missed visits   OT Treatment/Interventions Moist Heat;Fluidtherapy;Manual Therapy;Paraffin;Electrical Stimulation;Self-care/ADL training;Therapeutic exercise;Ultrasound;Cryotherapy;Patient/family education;Passive range of motion;Scar mobilization;Therapeutic activities;DME and/or AE instruction;Splinting    Plan Fludio, ultrasound, review PROM HEP, issue putty HEP    OT Home Exercise Plan Education Provided:  AROM  PROM    Consulted and Agree with Plan of Care Patient             Patient will benefit from skilled therapeutic intervention in order to improve the following deficits and impairments:   Body Structure /  Function / Physical Skills: ADL, Strength, Decreased knowledge of use of DME, GMC, Pain, Dexterity, UE functional use, ROM, IADL, Wound, Sensation, Flexibility, Coordination, FMC, Decreased knowledge of precautions       Visit Diagnosis: Stiffness of right hand, not elsewhere classified  Pain in right hand  Other lack of coordination  Stiffness of right wrist, not elsewhere classified  Muscle weakness (generalized)    Problem List Patient Active Problem List   Diagnosis Date Noted   Type 2 diabetes mellitus without complication, without long-term current use of insulin ([place]) 10/01/2018   Trigger finger, right ring finger 09/09/2015   Low back pain radiating to both  legs 05/06/2012   Leg length discrepancy 05/06/2012   HYPERTENSION, BENIGN 04/11/2009   LEG EDEMA, BILATERAL 10/08/2008   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 10/17/2006   Carpal tunnel syndrome, right upper limb 10/17/2006    [facility] 04/04/2021, 12:45 PM  [facility] [address] [place], [place], [address] Phone: [phone]   Fax:  [phone]  Name: Andrea Montes MRN: [id number] Date of Birth: 11-22-1955  Vianne Bulls, OTR/L [facility] [address] [place], [place]  [address] [phone] phone [phone] 04/04/21 12:45 PM

## 2021-04-04 NOTE — Patient Instructions (Addendum)
   MP / PIP / DIP Composite Flexion (Passive Stretch)    Use other hand to bend  finger at all three joints. Hold 10 seconds. Repeat 10 times. Do 3 sessions per day.    PROM: Finger MP Joints   Passively bend  finger of hand at big knuckle until stretch is felt. Hold _10___ seconds. Relax. Straighten finger as far as possible. Repeat __5__ times per set.  Do __3__ sessions per day.        PIP / DIP Composite Flexion (Passive Stretch)    Use other hand to bend middle and tip joints of  finger. Hold  10 seconds. Repeat 5 times. Do 3 sessions per day.

## 2021-04-07 ENCOUNTER — Encounter: Payer: Self-pay | Admitting: Occupational Therapy

## 2021-04-07 ENCOUNTER — Other Ambulatory Visit: Payer: Self-pay

## 2021-04-07 ENCOUNTER — Ambulatory Visit: Payer: 59 | Admitting: Occupational Therapy

## 2021-04-07 ENCOUNTER — Ambulatory Visit (INDEPENDENT_AMBULATORY_CARE_PROVIDER_SITE_OTHER): Payer: 59 | Admitting: Orthopedic Surgery

## 2021-04-07 DIAGNOSIS — M25512 Pain in left shoulder: Secondary | ICD-10-CM

## 2021-04-07 DIAGNOSIS — M25641 Stiffness of right hand, not elsewhere classified: Secondary | ICD-10-CM

## 2021-04-07 DIAGNOSIS — R278 Other lack of coordination: Secondary | ICD-10-CM

## 2021-04-07 DIAGNOSIS — M6281 Muscle weakness (generalized): Secondary | ICD-10-CM | POA: Diagnosis not present

## 2021-04-07 DIAGNOSIS — M79641 Pain in right hand: Secondary | ICD-10-CM

## 2021-04-07 DIAGNOSIS — M25631 Stiffness of right wrist, not elsewhere classified: Secondary | ICD-10-CM

## 2021-04-07 NOTE — Patient Instructions (Signed)
Access Code: [email or web address] URL: [email or web address] Date: 04/07/2021 Prepared by: Waldo Laine  Exercises Putty Squeezes - 1 x daily - 7 x weekly - 3 sets - 10 reps Rolling Putty on Table - 1 x daily - 7 x weekly - 3 sets - 10 reps Thumb Opposition with Putty - 1 x daily - 7 x weekly - 3 sets - 10 reps Seated Finger MP Flexion with Putty - 1 x daily - 7 x weekly - 3 sets - 10 reps

## 2021-04-07 NOTE — Therapy (Signed)
[facility] [address] [place], [place], [address] Phone: [phone]   Fax:  [phone]  Occupational Therapy Treatment  Patient Details  Name: Andrea Montes MRN: [id number] Date of Birth: 1956/04/22 Referring Provider (OT): Meredith Pel, MD   Encounter Date: 04/07/2021   OT End of Session - 04/07/21 0800     Visit Number 6    Number of Visits 10    Date for OT Re-Evaluation 04/20/21   2x/week for 3 weeks, 1x/week for 3 weeks = 9 visits over 8 weeks to allow for missed visits and/or scheduling conflicts   Authorization Type [place]    Authorization Time Period VL: 25    OT Start Time 0800    OT Stop Time 0841    OT Time Calculation (min) 41 min    Activity Tolerance Patient tolerated treatment well    Behavior During Therapy [facility] for tasks assessed/performed             Past Medical History:  Diagnosis Date   Anemia    no current med.   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   GERD (gastroesophageal reflux disease)    Heart murmur    last  echo- 2010   History of blood transfusion    History of shingles    Hypertension    borderline not on medicaiton at this time   Impingement syndrome of left shoulder region 10/2014   Joint pain    Migraines    Obesity    Pre-diabetes    Rheumatoid arteritis (HCC)    Seasonal allergies    Sleep apnea    uses CPAP nightly   Trigger finger, left middle finger     Vitamin D deficiency    Wears partial dentures    upper    Past Surgical History:  Procedure Laterality Date   CARPAL TUNNEL RELEASE Left 04/25/2006   CARPAL TUNNEL RELEASE Right 07/15/2018   Procedure: RIGHT CARPAL TUNNEL RELEASE;  Surgeon: Meredith Pel, MD;  Location: [facility];  Service: Orthopedics;  Laterality: Right;   CLOSED MANIPULATION SHOULDER WITH STERIOD INJECTION Left 02/03/2015   Procedure: LEFT  MANIPULATION SHOULDER UNDER ANESTHESIA WITH INJECTION OF STEROID;  Surgeon: Kathryne Hitch, MD;  Location: [facility];  Service: Orthopedics;  Laterality: Left;   COLONOSCOPY     COLONOSCOPY WITH PROPOFOL  10/09/2011   POLYPECTOMY     RESECTION DISTAL CLAVICAL Left 11/04/2014   Procedure: RESECTION DISTAL CLAVICAL;  Surgeon: Kathryne Hitch, MD;  Location: [facility];  Service: Orthopedics;  Laterality: Left;   ROTATOR CUFF REPAIR  2010   SHOULDER ARTHROSCOPY WITH ROTATOR CUFF REPAIR Right 10/04/2008   SHOULDER ARTHROSCOPY WITH SUBACROMIAL DECOMPRESSION, ROTATOR CUFF REPAIR AND BICEP TENDON REPAIR Left 11/04/2014   Procedure: LEFT SHOULDER ARTHROSCOPY WITH DEBRIDEMENT, DISTAL CLAVICLE EXCISION, ACROMIOPLASTY, ROTATOR CUFF REPAIR AND BICEP TENODESIS;  Surgeon: Quillian Quince  Percell Miller, MD;  Location: [facility];  Service: Orthopedics;  Laterality: Left;   TRIGGER FINGER RELEASE Right 02/03/2015   Procedure: RIGHT LONG FINGER TRIGGER RELEASE;  Surgeon: Kathryne Hitch, MD;  Location: [facility];  Service: Orthopedics;  Laterality: Right;   TRIGGER FINGER RELEASE Left 10/10/2017   Procedure: LEFT HAND RELEASE TRIGGER FINGER 3RD LONG FINGER AND CYST EXCISION;  Surgeon: Meredith Pel, MD;  Location: [facility];  Service: Orthopedics;  Laterality: Left;   TRIGGER FINGER RELEASE Right 02/16/2021   Procedure: TRIGGER FINGER RELEASE RIGHT RING FINGER;  Surgeon: Meredith Pel, MD;  Location: [facility];  Service: Orthopedics;  Laterality: Right;   TUBAL  LIGATION     VAGINAL HYSTERECTOMY  2001   partial    There were no vitals filed for this visit.   Subjective Assessment - 04/07/21 0759     Subjective  "today is not a good day - my hand was giving me a fit last night. For some odd reason I started having spasms in my hand yesterday"    Pertinent History PMH: DM, partial hysterectomy, RTC BUE (currently LUE shoulder pain waiting for MRI)    Limitations no lifitng over 2 lbs RUE    Patient Stated Goals "to be able to use my hand"    Currently in Pain? Yes    Pain Score 4     Pain Location Hand    Pain Orientation Right    Pain Descriptors / Indicators Throbbing;Aching    Pain Type Acute pain    Pain Onset 1 to 4 weeks ago    Pain Frequency Constant    Aggravating Factors  unknown    Pain Relieving Factors massage                          OT Treatments/Exercises (OP) - 04/07/21 0806       Exercises   Exercises Theraputty;Hand      Hand Exercises   Other Hand Exercises reviewed PROM HEP      Theraputty   Theraputty - Flatten issued theraputty for RUE      Modalities   Modalities Ultrasound;Fluidotherapy      Ultrasound   Ultrasound Location RUE volar side - incision site   no adverse reactions   Ultrasound Parameters 43mz, 0.8wts/cm2, 20% pulsed    Ultrasound Goals Edema;Pain   +scar massage     RUE Fluidotherapy   Number Minutes Fluidotherapy 12 Minutes    RUE Fluidotherapy Location Hand    Comments stiffness, pain, scar massage                    OT Education - 04/07/21 0834     Education Details [employer] - Access Code: [email or web address] Instructed to only do reps assigned and then put away.    Person(s) Educated Patient    Methods Explanation;Demonstration    Comprehension Verbalized understanding;Returned demonstration              OT Short Term Goals - 04/04/21 0836       OT SHORT TERM GOAL #1   Title Pt will be independent with initial HEP    Time 4    Period Weeks     Status On-going    Target Date 04/06/21      OT SHORT TERM GOAL #2   Title Pt will increase range of motion of RUE wrist flexion to 40 degrees or more for  increase in functional use    Baseline 35*    Time 4    Period Weeks    Status Achieved   04/04/21:  60*     OT SHORT TERM GOAL #3   Title Pt will demonstrate scar massage and management techniques.    Time 4    Period Weeks    Status Achieved      OT SHORT TERM GOAL #4   Title Pt will increase composite flexion to 60% or greater to increase functional use of RUE    Baseline ~45%    Time 4    Period Weeks    Status On-going   04/04/21:  90-95% after stretching, approx 50% prior to stretching     OT SHORT TERM GOAL #5   Title Pt will report pain no greater than 3/10 while completing HEP    Baseline 4/10 at rest    Time 4    Period Weeks    Status On-going   04/04/21:  3-4/10              OT Long Term Goals - 03/09/21 1747       OT LONG TERM GOAL #1   Title Pt will be independent with updated HEPs    Time 8    Period Weeks    Status New    Target Date 05/04/21      OT LONG TERM GOAL #2   Title Pt will increase composite flexion to 80% or greater in RUE    Baseline 45%    Time 8    Period Weeks    Status New      OT LONG TERM GOAL #3   Title Pt will demonstrate grip strength in RUE of 30 lbs or greater for demonstrating functional strength in RUE, dominant hand.    Baseline not tested at eval d/t precautions    Time 8    Period Weeks    Status New      OT LONG TERM GOAL #4   Title Pt will increase 9 hole peg test score with RUE by 3 seconds for improved coordination    Baseline R 32.09s (swiped pegs out to remove)    Time 8    Period Weeks    Status New      OT LONG TERM GOAL #5   Title Pt will demonstrate composite extension of 100% in RUE.    Baseline 90%    Time 8    Period Weeks    Status New                   Plan - 04/07/21 [id number]     Clinical Impression Statement Pt continues  to make good progress. Pt with increased pain at arrival today d/t trying to cook spaghetti and using tight grip with RUE. Pt progressing with increased ROM and decreased sensitivity at incision site. Continue towards unmet goals.    OT Occupational Profile and History Problem Focused Assessment - Including review of records relating to presenting problem    Occupational performance deficits (Please refer to evaluation for details): IADL's;ADL's;Leisure;Work    Marketing executive / Function / Physical Skills ADL;Strength;Decreased knowledge of use of DME;GMC;Pain;Dexterity;UE functional use;ROM;IADL;Wound;Sensation;Flexibility;Coordination;FMC;Decreased knowledge of precautions    Rehab Potential Good    Clinical Decision Making Limited treatment options, no task modification necessary    Comorbidities Affecting Occupational Performance: None    Modification or Assistance to Complete Evaluation  No modification of tasks or  assist necessary to complete eval    OT Frequency 2x / week   2x/week for 3 weeks, 1x/week for 3 weeks   OT Duration 8 weeks   9 visits over 8 weeks d/t scheduling and potential missed visits   OT Treatment/Interventions Moist Heat;Fluidtherapy;Manual Therapy;Paraffin;Electrical Stimulation;Self-care/ADL training;Therapeutic exercise;Ultrasound;Cryotherapy;Patient/family education;Passive range of motion;Scar mobilization;Therapeutic activities;DME and/or AE instruction;Splinting    Plan check to see how putty is going, Fluido, Ultrasound, maybe issue foam for building up grips?    OT Home Exercise Plan Education Provided:  AROM  PROM    Consulted and Agree with Plan of Care Patient             Patient will benefit from skilled therapeutic intervention in order to improve the following deficits and impairments:   Body Structure / Function / Physical Skills: ADL, Strength, Decreased knowledge of use of DME, GMC, Pain, Dexterity, UE functional use, ROM, IADL, Wound, Sensation,  Flexibility, Coordination, FMC, Decreased knowledge of precautions       Visit Diagnosis: Stiffness of right hand, not elsewhere classified  Pain in right hand  Other lack of coordination  Stiffness of right wrist, not elsewhere classified  Muscle weakness (generalized)    Problem List Patient Active Problem List   Diagnosis Date Noted   Type 2 diabetes mellitus without complication, without long-term current use of insulin ([place]) 10/01/2018   Trigger finger, right ring finger 09/09/2015   Low back pain radiating to both legs 05/06/2012   Leg length discrepancy 05/06/2012   HYPERTENSION, BENIGN 04/11/2009   LEG EDEMA, BILATERAL 10/08/2008   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 10/17/2006   Carpal tunnel syndrome, right upper limb 10/17/2006    Zachery Conch MOT, OTR/L  04/07/2021, 8:43 AM  [facility] [address] [place], [place], [address] Phone: [phone]   Fax:  [phone]  Name: DYAMON KACZMAREK MRN: [id number] Date of Birth: [birth year]

## 2021-04-09 ENCOUNTER — Encounter: Payer: Self-pay | Admitting: Orthopedic Surgery

## 2021-04-09 NOTE — Progress Notes (Signed)
Office Visit Note   Patient: Andrea Montes           Date of Birth: 12-19-1955           MRN: [id number] Visit Date: 04/07/2021 Requested by: Seward Carol, MD [address] [place],  Andrea Montes [id number] PCP: Seward Carol, MD  Subjective: Chief Complaint  Patient presents with   Other     Scan review    HPI: Andrea Montes is a 65 year old patient with trigger finger release right fourth 02/16/2021.  She has been making slow but steady progress with grip strength.  She does have a superficial infection treated successfully with Bactrim which delayed her rehabilitative progress.  She had diminished grip strength and pinch strength and decreased dexterity until recently.  She also is here to follow-up MRI scan of her left shoulder.  PET scan is reviewed and she does have a small focal punctate full-thickness region of tearing around the infraspinatus.  Subscapularis intact.  Supraspinatus tendon intact as well.  Overall her shoulder is functional enough that she can live with that for now.  Her finger has been the thing that has been delaying her recovery and returned to work.              ROS: All systems reviewed are negative as they relate to the chief complaint within the history of present illness.  Patient denies  fevers or chills.   Assessment & Plan: Visit Diagnoses:  1. Left shoulder pain, unspecified chronicity     Plan: Impression is small punctate rotator cuff tear with good strength and no coarse grinding or crepitus on exam.  I think this is something we can watch and follow-up in 4 months for clinical recheck.  The right fourth finger is improving.  She still has a little bit of tightness around the finger but she is functional enough to return to work at this time.  Prior to that she really did not have the grip strength or dexterity to do any type of functional typing with that right hand or data injury with the right hand.  Follow-Up Instructions: Return in  about 4 months (around 08/07/2021).   Orders:  No orders of the defined types were placed in this encounter.  No orders of the defined types were placed in this encounter.     Procedures: No procedures performed   Clinical Data: No additional findings.  Objective: Vital Signs: There were no vitals taken for this visit.  Physical Exam:   Constitutional: Patient appears well-developed HEENT:  Head: Normocephalic Eyes:EOM are normal Neck: Normal range of motion Cardiovascular: Normal rate Pulmonary/chest: Effort normal Neurologic: Patient is alert Skin: Skin is warm Psychiatric: Patient has normal mood and affect   Ortho Exam: Ortho exam demonstrates mild tenderness and some swelling around the base of the fourth metacarpal.  No fluctuance induration or erythema around the incision.  Does have a little weakness to finger flexion but her DIP and PIP flexion is intact.  Radial pulse intact bilaterally.  Left shoulder is examined and she has no coarse grinding or crepitus with no restriction of passive range of motion.  Strength is actually pretty reasonable infraspinatus supraspinatus subscap muscle testing with negative O'Brien's testing.  Specialty Comments:  No specialty comments available.  Imaging: No results found.   PMFS History: Patient Active Problem List   Diagnosis Date Noted   Type 2 diabetes mellitus without complication, without long-term current use of insulin ([facility]) 10/01/2018  Trigger finger, right ring finger 09/09/2015   Low back pain radiating to both legs 05/06/2012   Leg length discrepancy 05/06/2012   HYPERTENSION, BENIGN 04/11/2009   LEG EDEMA, BILATERAL 10/08/2008   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 10/17/2006   Carpal tunnel syndrome, right upper limb 10/17/2006   Past Medical History:   Diagnosis Date   Anemia    no current med.   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   GERD (gastroesophageal reflux disease)    Heart murmur    last  echo- 2010   History of blood transfusion    History of shingles    Hypertension    borderline not on medicaiton at this time   Impingement syndrome of left shoulder region 10/2014   Joint pain    Migraines    Obesity    Pre-diabetes    Rheumatoid arteritis (HCC)    Seasonal allergies    Sleep apnea    uses CPAP nightly   Trigger finger, left middle finger    Vitamin D deficiency    Wears partial dentures    upper    Family History  Problem Relation Age of Onset   Kidney disease Brother    Hypertension Brother    Leukemia Mother    Heart disease Mother    Stroke Mother    Cancer Mother    Depression Mother    Anxiety disorder Mother    AAA (abdominal aortic aneurysm) Father    Esophageal cancer Maternal Uncle    Hypertension Brother    Colon cancer Neg Hx    Colon polyps Neg Hx    Rectal cancer Neg Hx    Stomach cancer Neg Hx     Past Surgical History:  Procedure Laterality Date   CARPAL TUNNEL RELEASE Left 04/25/2006   CARPAL TUNNEL RELEASE Right 07/15/2018   Procedure: RIGHT CARPAL TUNNEL RELEASE;  Surgeon: Meredith Pel, MD;  Location: [facility];  Service: Orthopedics;  Laterality: Right;   CLOSED MANIPULATION SHOULDER WITH STERIOD INJECTION Left 02/03/2015   Procedure: LEFT  MANIPULATION SHOULDER UNDER ANESTHESIA WITH INJECTION OF  STEROID;  Surgeon: Kathryne Hitch, MD;  Location: [facility];  Service: Orthopedics;  Laterality: Left;   COLONOSCOPY     COLONOSCOPY WITH PROPOFOL  10/09/2011   POLYPECTOMY     RESECTION DISTAL CLAVICAL Left 11/04/2014   Procedure: RESECTION DISTAL CLAVICAL;  Surgeon: Kathryne Hitch, MD;  Location: [facility];  Service: Orthopedics;  Laterality: Left;   ROTATOR CUFF REPAIR  2010   SHOULDER ARTHROSCOPY WITH ROTATOR CUFF REPAIR Right 10/04/2008   SHOULDER ARTHROSCOPY WITH SUBACROMIAL DECOMPRESSION, ROTATOR CUFF REPAIR AND BICEP TENDON REPAIR Left 11/04/2014   Procedure: LEFT SHOULDER ARTHROSCOPY WITH DEBRIDEMENT, DISTAL CLAVICLE EXCISION, ACROMIOPLASTY, ROTATOR CUFF REPAIR AND  BICEP TENODESIS;  Surgeon: Kathryne Hitch, MD;  Location: [facility];  Service: Orthopedics;  Laterality: Left;   TRIGGER FINGER RELEASE Right 02/03/2015   Procedure: RIGHT LONG FINGER TRIGGER RELEASE;  Surgeon: Kathryne Hitch, MD;  Location: [facility];  Service: Orthopedics;  Laterality: Right;   TRIGGER FINGER RELEASE Left 10/10/2017   Procedure: LEFT HAND RELEASE TRIGGER FINGER 3RD LONG FINGER AND CYST EXCISION;  Surgeon: Meredith Pel, MD;  Location: [facility];  Service: Orthopedics;  Laterality: Left;   TRIGGER FINGER RELEASE Right 02/16/2021   Procedure: TRIGGER FINGER RELEASE RIGHT RING FINGER;  Surgeon: Meredith Pel, MD;  Location: [facility];  Service: Orthopedics;  Laterality: Right;   TUBAL LIGATION     VAGINAL HYSTERECTOMY  2001   partial   Social History   Occupational History   Not on file  Tobacco Use   Smoking status: Never   Smokeless tobacco: Never  Vaping Use   Vaping Use: Never used  Substance and Sexual Activity   Alcohol use: No   Drug use: No   Sexual activity: Not on file

## 2021-04-10 ENCOUNTER — Encounter: Payer: Self-pay | Admitting: Occupational Therapy

## 2021-04-10 NOTE — Therapy (Signed)
[facility] [address] [place], [place], [address] Phone: [phone]   Fax:  [phone]  Occupational Therapy Treatment  Patient Details  Name: Andrea Montes MRN: [id number] Date of Birth: 08/27/1955 Referring Provider (OT): Meredith Pel, MD   Encounter Date: 04/10/2021    Past Medical History:  Diagnosis Date   Anemia    no current med.   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   GERD (gastroesophageal reflux disease)    Heart murmur    last  echo- 2010   History of blood transfusion    History of shingles    Hypertension    borderline not on medicaiton at this time   Impingement syndrome of left shoulder region 10/2014   Joint pain    Migraines    Obesity    Pre-diabetes    Rheumatoid arteritis (HCC)    Seasonal allergies    Sleep apnea    uses CPAP nightly   Trigger finger, left middle finger    Vitamin D deficiency    Wears partial dentures    upper    Past Surgical History:  Procedure Laterality Date   CARPAL TUNNEL RELEASE Left 04/25/2006   CARPAL TUNNEL RELEASE Right 07/15/2018   Procedure: RIGHT CARPAL TUNNEL RELEASE;  Surgeon: Meredith Pel, MD;  Location: [facility];  Service: Orthopedics;  Laterality: Right;   CLOSED MANIPULATION SHOULDER WITH STERIOD INJECTION Left 02/03/2015   Procedure: LEFT  MANIPULATION SHOULDER UNDER ANESTHESIA WITH INJECTION OF STEROID;  Surgeon: Kathryne Hitch, MD;  Location:  [facility];  Service: Orthopedics;  Laterality: Left;   COLONOSCOPY     COLONOSCOPY WITH PROPOFOL  10/09/2011   POLYPECTOMY     RESECTION DISTAL CLAVICAL Left 11/04/2014   Procedure: RESECTION DISTAL CLAVICAL;  Surgeon: Kathryne Hitch, MD;  Location: [facility];  Service: Orthopedics;  Laterality: Left;   ROTATOR CUFF REPAIR  2010   SHOULDER ARTHROSCOPY WITH ROTATOR CUFF REPAIR Right 10/04/2008   SHOULDER ARTHROSCOPY WITH SUBACROMIAL DECOMPRESSION, ROTATOR CUFF REPAIR AND BICEP TENDON REPAIR Left 11/04/2014   Procedure: LEFT SHOULDER ARTHROSCOPY WITH DEBRIDEMENT, DISTAL CLAVICLE EXCISION, ACROMIOPLASTY, ROTATOR CUFF REPAIR AND BICEP TENODESIS;  Surgeon: Kathryne Hitch, MD;  Location: [facility];  Service: Orthopedics;  Laterality: Left;   TRIGGER FINGER RELEASE Right 02/03/2015   Procedure: RIGHT LONG FINGER TRIGGER RELEASE;  Surgeon: Kathryne Hitch, MD;  Location: [facility];  Service: Orthopedics;  Laterality: Right;   TRIGGER FINGER RELEASE Left 10/10/2017   Procedure: LEFT HAND RELEASE TRIGGER FINGER 3RD LONG FINGER AND CYST EXCISION;  Surgeon: Meredith Pel, MD;  Location: [facility];  Service: Orthopedics;  Laterality: Left;   TRIGGER FINGER RELEASE Right 02/16/2021   Procedure: TRIGGER FINGER RELEASE RIGHT RING FINGER;  Surgeon: Meredith Pel, MD;  Location: [facility];  Service: Orthopedics;  Laterality: Right;   TUBAL LIGATION  VAGINAL HYSTERECTOMY  2001   partial    There were no vitals filed for this visit.        OCCUPATIONAL THERAPY DISCHARGE SUMMARY  Visits from Start of Care: 6  Current functional level related to goals / functional outcomes: Pt had improved with wrist ROM and composite flexion/extension after heat and stretching.    Remaining deficits: Pt continues with significant limitations in ROM in RUE hand and sensitivity at incision site.    Education / Equipment: HEPs for AROM, PROM, theraputty    Patient agrees to discharge. Patient goals were  unable to be tested d/t patient not returning . Patient is being discharged due to not returning since the last visit.and reporting MD has cleared for back to work and cancelled remaining appts..                      OT Short Term Goals - 04/04/21 0836       OT SHORT TERM GOAL #1   Title Pt will be independent with initial HEP    Time 4    Period Weeks    Status On-going    Target Date 04/06/21      OT SHORT TERM GOAL #2   Title Pt will increase range of motion of RUE wrist flexion to 40 degrees or more for increase in functional use    Baseline 35*    Time 4    Period Weeks    Status Achieved   04/04/21:  60*     OT SHORT TERM GOAL #3   Title Pt will demonstrate scar massage and management techniques.    Time 4    Period Weeks    Status Achieved      OT SHORT TERM GOAL #4   Title Pt will increase composite flexion to 60% or greater to increase functional use of RUE    Baseline ~45%    Time 4    Period Weeks    Status On-going   04/04/21:  90-95% after stretching, approx 50% prior to stretching     OT SHORT TERM GOAL #5   Title Pt will report pain no greater than 3/10 while completing HEP    Baseline 4/10 at rest    Time 4    Period Weeks    Status On-going   04/04/21:  3-4/10              OT Long Term Goals - 03/09/21 1747       OT LONG TERM GOAL #1   Title Pt will be independent with updated HEPs    Time 8    Period Weeks    Status New    Target Date 05/04/21      OT LONG TERM GOAL #2   Title Pt will increase composite flexion to 80% or greater in RUE    Baseline 45%    Time 8    Period Weeks    Status New      OT LONG TERM GOAL #3   Title Pt will demonstrate grip strength in RUE of 30 lbs or greater for demonstrating functional strength in RUE, dominant hand.    Baseline not tested at eval d/t precautions    Time 8    Period Weeks    Status New      OT LONG TERM GOAL #4    Title Pt will increase 9 hole peg test score with RUE by 3 seconds for improved  coordination    Baseline R 32.09s (swiped pegs out to remove)    Time 8    Period Weeks    Status New      OT LONG TERM GOAL #5   Title Pt will demonstrate composite extension of 100% in RUE.    Baseline 90%    Time 8    Period Weeks    Status New                    Patient will benefit from skilled therapeutic intervention in order to improve the following deficits and impairments:           Visit Diagnosis: No diagnosis found.    Problem List Patient Active Problem List   Diagnosis Date Noted   Type 2 diabetes mellitus without complication, without long-term current use of insulin ([facility]) 10/01/2018   Trigger finger, right ring finger 09/09/2015   Low back pain radiating to both legs 05/06/2012   Leg length discrepancy 05/06/2012   HYPERTENSION, BENIGN 04/11/2009   LEG EDEMA, BILATERAL 10/08/2008   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 10/17/2006   Carpal tunnel syndrome, right upper limb 10/17/2006    Zachery Conch MOT, OTR/L  04/10/2021, 1:48 PM  [facility] [address] [place], [place], [address] Phone: [phone]   Fax:  [phone]  Name: Andrea Montes MRN: [id number] Date of Birth: Jan 12, 1956

## 2021-04-11 ENCOUNTER — Ambulatory Visit: Payer: 59 | Admitting: Occupational Therapy

## 2021-04-14 ENCOUNTER — Encounter: Payer: 59 | Admitting: Occupational Therapy

## 2021-04-17 ENCOUNTER — Telehealth: Payer: Self-pay | Admitting: Podiatry

## 2021-04-17 NOTE — Telephone Encounter (Signed)
Orthotics in.. pt aware ok to pick up.. 

## 2021-04-18 ENCOUNTER — Encounter: Payer: 59 | Admitting: Occupational Therapy

## 2021-04-21 ENCOUNTER — Encounter: Payer: 59 | Admitting: Occupational Therapy

## 2021-04-26 ENCOUNTER — Encounter: Payer: 59 | Admitting: Occupational Therapy

## 2021-04-28 ENCOUNTER — Encounter: Payer: 59 | Admitting: Occupational Therapy

## 2021-05-05 ENCOUNTER — Other Ambulatory Visit (HOSPITAL_COMMUNITY): Payer: Self-pay

## 2021-05-05 ENCOUNTER — Encounter: Payer: 59 | Admitting: Occupational Therapy

## 2021-05-05 MED ORDER — CARESTART COVID-19 HOME TEST VI KIT
PACK | 0 refills | Status: DC
  Filled 2021-05-05: qty 4, 4d supply, fill #0

## 2021-05-05 MED FILL — Insulin Glargine-yfgn Soln Pen-Injector 100 Unit/ML: SUBCUTANEOUS | 90 days supply | Qty: 9 | Fill #0 | Status: AC

## 2021-05-06 DIAGNOSIS — G4733 Obstructive sleep apnea (adult) (pediatric): Secondary | ICD-10-CM | POA: Diagnosis not present

## 2021-05-09 ENCOUNTER — Other Ambulatory Visit (HOSPITAL_COMMUNITY): Payer: Self-pay

## 2021-05-10 DIAGNOSIS — R509 Fever, unspecified: Secondary | ICD-10-CM | POA: Diagnosis not present

## 2021-05-10 DIAGNOSIS — R52 Pain, unspecified: Secondary | ICD-10-CM | POA: Diagnosis not present

## 2021-05-10 DIAGNOSIS — R059 Cough, unspecified: Secondary | ICD-10-CM | POA: Diagnosis not present

## 2021-05-10 DIAGNOSIS — U071 COVID-19: Secondary | ICD-10-CM | POA: Diagnosis not present

## 2021-05-10 DIAGNOSIS — R0981 Nasal congestion: Secondary | ICD-10-CM | POA: Diagnosis not present

## 2021-05-12 ENCOUNTER — Encounter: Payer: 59 | Admitting: Occupational Therapy

## 2021-05-15 ENCOUNTER — Encounter: Payer: Self-pay | Admitting: Podiatry

## 2021-05-19 ENCOUNTER — Encounter: Payer: 59 | Admitting: Occupational Therapy

## 2021-06-05 DIAGNOSIS — G4733 Obstructive sleep apnea (adult) (pediatric): Secondary | ICD-10-CM | POA: Diagnosis not present

## 2021-06-15 ENCOUNTER — Other Ambulatory Visit (HOSPITAL_COMMUNITY): Payer: Self-pay

## 2021-07-06 DIAGNOSIS — G4733 Obstructive sleep apnea (adult) (pediatric): Secondary | ICD-10-CM | POA: Diagnosis not present

## 2021-07-07 ENCOUNTER — Ambulatory Visit: Payer: 59 | Admitting: Podiatry

## 2021-07-10 ENCOUNTER — Ambulatory Visit (INDEPENDENT_AMBULATORY_CARE_PROVIDER_SITE_OTHER): Payer: 59

## 2021-07-10 ENCOUNTER — Other Ambulatory Visit: Payer: Self-pay | Admitting: *Deleted

## 2021-07-10 DIAGNOSIS — R002 Palpitations: Secondary | ICD-10-CM

## 2021-07-10 NOTE — Progress Notes (Unsigned)
Patient given 14 day ZIO XT serial # [id number] from office inventory to apply at home.

## 2021-07-10 NOTE — Progress Notes (Signed)
14 day zio for palpitations ordered per Dr Jenkins [place]

## 2021-07-11 DIAGNOSIS — R002 Palpitations: Secondary | ICD-10-CM

## 2021-07-12 ENCOUNTER — Other Ambulatory Visit (HOSPITAL_COMMUNITY): Payer: Self-pay

## 2021-07-12 MED ORDER — CARESTART COVID-19 HOME TEST VI KIT
PACK | 0 refills | Status: AC
  Filled 2021-07-12: qty 4, 4d supply, fill #0

## 2021-07-13 ENCOUNTER — Emergency Department (HOSPITAL_BASED_OUTPATIENT_CLINIC_OR_DEPARTMENT_OTHER)
Admission: EM | Admit: 2021-07-13 | Discharge: 2021-07-13 | Disposition: A | Discharge status: Home / Self Care | Payer: 59 | Attending: Emergency Medicine | Admitting: Emergency Medicine

## 2021-07-13 ENCOUNTER — Encounter (HOSPITAL_BASED_OUTPATIENT_CLINIC_OR_DEPARTMENT_OTHER): Payer: Self-pay | Admitting: Obstetrics and Gynecology

## 2021-07-13 ENCOUNTER — Other Ambulatory Visit: Payer: Self-pay

## 2021-07-13 DIAGNOSIS — Z20822 Contact with and (suspected) exposure to covid-19: Secondary | ICD-10-CM | POA: Diagnosis not present

## 2021-07-13 DIAGNOSIS — E119 Type 2 diabetes mellitus without complications: Secondary | ICD-10-CM | POA: Insufficient documentation

## 2021-07-13 DIAGNOSIS — Z7984 Long term (current) use of oral hypoglycemic drugs: Secondary | ICD-10-CM | POA: Diagnosis not present

## 2021-07-13 DIAGNOSIS — J029 Acute pharyngitis, unspecified: Secondary | ICD-10-CM | POA: Diagnosis not present

## 2021-07-13 DIAGNOSIS — I10 Essential (primary) hypertension: Secondary | ICD-10-CM | POA: Diagnosis not present

## 2021-07-13 DIAGNOSIS — Z79899 Other long term (current) drug therapy: Secondary | ICD-10-CM | POA: Insufficient documentation

## 2021-07-13 DIAGNOSIS — Z794 Long term (current) use of insulin: Secondary | ICD-10-CM | POA: Insufficient documentation

## 2021-07-13 LAB — RESP PANEL BY RT-PCR (FLU A&B, COVID) ARPGX2
Influenza A by PCR: NEGATIVE
Influenza B by PCR: NEGATIVE
SARS Coronavirus 2 by RT PCR: NEGATIVE

## 2021-07-13 LAB — CBG MONITORING, ED: Glucose-Capillary: 117 mg/dL — ABNORMAL HIGH (ref 70–99)

## 2021-07-13 LAB — GROUP A STREP BY PCR: Group A Strep by PCR: NOT DETECTED

## 2021-07-13 MED ORDER — DEXAMETHASONE SODIUM PHOSPHATE 10 MG/ML IJ SOLN
10.0000 mg | Freq: Once | INTRAMUSCULAR | Status: AC
  Administered 2021-07-13: 10 mg via INTRAMUSCULAR
  Filled 2021-07-13: qty 1

## 2021-07-13 NOTE — ED Provider Notes (Signed)
[facility] DEPT Provider Note   CSN: [id number] Arrival date & time: 07/13/21  1839     History Chief Complaint  Patient presents with   Sore Throat    Andrea Montes is a 65 y.o. female.  Patient with history of diabetes presents with sore throat, cough starting yesterday.  Hurts more with swallowing but she is tolerating liquids.  No ear pain, runny nose.  No shortness of breath.  Cough is nonproductive, mild per patient report.  No nausea, vomiting, or diarrhea.  No fevers.  Temperature 99.5 F on arrival.  No treatments prior to arrival.  She states that she was around her grandkids yesterday morning. The onset of this condition was acute. The course is constant. Aggravating factors: none. Alleviating factors: none.        Past Medical History:  Diagnosis Date   Anemia    no current med.   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   GERD (gastroesophageal reflux disease)    Heart murmur    last  echo- 2010   History of blood transfusion    History of shingles    Hypertension    borderline not on medicaiton at this time   Impingement syndrome of left shoulder region 10/2014   Joint pain    Migraines    Obesity    Pre-diabetes    Rheumatoid arteritis (HCC)    Seasonal allergies    Sleep apnea    uses CPAP nightly   Trigger finger, left middle finger    Vitamin D deficiency    Wears partial dentures    upper    Patient Active Problem List   Diagnosis Date  Noted   Type 2 diabetes mellitus without complication, without long-term current use of insulin ([facility]) 10/01/2018   Trigger finger, right ring finger 09/09/2015   Low back pain radiating to both legs 05/06/2012   Leg length discrepancy 05/06/2012   HYPERTENSION, BENIGN 04/11/2009   LEG EDEMA, BILATERAL 10/08/2008   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 10/17/2006   Carpal tunnel syndrome, right upper limb 10/17/2006    Past Surgical History:  Procedure Laterality Date   CARPAL TUNNEL RELEASE Left 04/25/2006   CARPAL TUNNEL RELEASE Right 07/15/2018   Procedure: RIGHT CARPAL TUNNEL RELEASE;  Surgeon: Meredith Pel, MD;  Location: [facility];  Service: Orthopedics;  Laterality: Right;   CLOSED MANIPULATION SHOULDER WITH STERIOD INJECTION Left 02/03/2015   Procedure: LEFT  MANIPULATION SHOULDER UNDER ANESTHESIA WITH INJECTION OF STEROID;  Surgeon: Kathryne Hitch, MD;  Location: [facility];  Service: Orthopedics;  Laterality: Left;   COLONOSCOPY     COLONOSCOPY WITH PROPOFOL  10/09/2011   POLYPECTOMY     RESECTION DISTAL CLAVICAL Left 11/04/2014   Procedure: RESECTION DISTAL CLAVICAL;  Surgeon: Kathryne Hitch, MD;  Location: [facility];  Service: Orthopedics;  Laterality: Left;   ROTATOR CUFF REPAIR  2010   SHOULDER ARTHROSCOPY WITH ROTATOR CUFF REPAIR Right 10/04/2008   SHOULDER ARTHROSCOPY WITH SUBACROMIAL DECOMPRESSION, ROTATOR CUFF REPAIR AND BICEP TENDON REPAIR Left 11/04/2014   Procedure: LEFT SHOULDER ARTHROSCOPY WITH DEBRIDEMENT, DISTAL CLAVICLE EXCISION, ACROMIOPLASTY, ROTATOR CUFF REPAIR AND BICEP TENODESIS;  Surgeon: Kathryne Hitch, MD;  Location: [employer];  Service: Orthopedics;  Laterality: Left;   TRIGGER FINGER RELEASE Right 02/03/2015   Procedure: RIGHT LONG FINGER TRIGGER RELEASE;  Surgeon: Kathryne Hitch, MD;  Location: [facility];  Service: Orthopedics;  Laterality: Right;   TRIGGER FINGER RELEASE Left 10/10/2017   Procedure: LEFT HAND RELEASE TRIGGER FINGER 3RD LONG FINGER AND CYST EXCISION;  Surgeon: Meredith Pel, MD;  Location: [facility];  Service: Orthopedics;  Laterality: Left;   TRIGGER FINGER RELEASE Right 02/16/2021   Procedure: TRIGGER FINGER RELEASE RIGHT RING FINGER;  Surgeon: Meredith Pel, MD;  Location: [facility];  Service: Orthopedics;  Laterality: Right;   TUBAL LIGATION     VAGINAL HYSTERECTOMY  2001   partial     OB History     Gravida  2   Para  2   Term      Preterm      AB      Living         SAB      IAB      Ectopic      Multiple      Live Births              Family History  Problem Relation Age of Onset   Kidney disease Brother    Hypertension Brother    Leukemia Mother    Heart disease Mother    Stroke Mother    Cancer Mother    Depression Mother    Anxiety disorder Mother    AAA (abdominal aortic aneurysm) Father    Esophageal cancer Maternal Uncle    Hypertension Brother    Colon cancer Neg Hx    Colon polyps Neg Hx    Rectal cancer Neg Hx    Stomach cancer Neg Hx     Social History   Tobacco Use   Smoking status: Never   Smokeless tobacco: Never  Vaping Use   Vaping Use: Never used  Substance Use Topics   Alcohol use: No   Drug use: No    Home Medications Prior to Admission medications   Medication Sig Start Date End Date Taking? Authorizing Provider  Aspirin-Acetaminophen-Caffeine (GOODY HEADACHE PO) Take 1 Package by mouth as needed (pain).    [provider]  atorvastatin (LIPITOR) 10 MG tablet TAKE 1 TABLET (10 MG TOTAL) BY MOUTH AT BEDTIME. 10/27/20 10/27/21  Josue Hector, MD  Blood Pressure Monitoring (OMRON 3 SERIES BP MONITOR) DEVI  10/05/19   [provider]  calcium carbonate (TUMS EX) 750 MG chewable tablet Chew 1-2 tablets by mouth 2 (two) times daily as  needed for heartburn.    [provider]  Cholecalciferol (VITAMIN D3) 50 MCG (2000 UT) TABS Take 2,000 Units by mouth every Friday.    [provider]  COVID-19 At Home Antigen Test [facility] COVID-19 HOME TEST) KIT Use as directed 07/12/21   Jefm Bryant, RPH  empagliflozin (JARDIANCE) 25 MG TABS tablet Take 1 tablet (25 mg total) by mouth daily. 06/23/20   Seward Carol,  MD  empagliflozin (JARDIANCE) 25 MG TABS tablet Take 1 tablet (25 mg total) by mouth daily. 03/06/21     FREESTYLE LITE test strip  12/17/19   [provider]  furosemide (LASIX) 20 MG tablet Take 1 tablet (20 mg total) by mouth daily. 03/01/21   Josue Hector, MD  furosemide (LASIX) 20 MG tablet Take 1 tablet (20 mg total) by mouth daily. 03/06/21     insulin glargine-yfgn (SEMGLEE) 100 UNIT/ML Pen INJECT 10 UNITS ONCE A DAY AT BEDTIME SUBCUTANEOUSLY 10/17/20 10/17/21  Seward Carol, MD  Insulin Pen Needle 31G X 6 MM MISC USE AS DIRECTED ONCE A DAY 08/02/20 08/02/21  Seward Carol, MD  Lancets (FREESTYLE) lancets 1 each 3 (three) times daily. 12/17/19   [provider]  losartan (COZAAR) 50 MG tablet Take 1 tablet (50 mg total) by mouth daily. 10/07/19   Josue Hector, MD  losartan (COZAAR) 50 MG tablet TAKE 1 TABLET BY MOUTH ONCE A DAY 06/23/20 06/23/21  Seward Carol, MD  losartan (COZAAR) 50 MG tablet Take 1 tablet (50 mg total) by mouth daily. 03/06/21     meloxicam (MOBIC) 15 MG tablet Take 1 tablet (15 mg total) by mouth daily as needed for pain. 05/02/17   Draper, Carlos Levering, DO  metFORMIN (GLUCOPHAGE-XR) 750 MG 24 hr tablet TAKE 2 TABLETS BY MOUTH DAILY WTIH FOOD. 02/19/20 02/18/21  Seward Carol, MD  metFORMIN (GLUCOPHAGE-XR) 750 MG 24 hr tablet Take 2 tablets (1,500 mg total) by mouth daily with food. 03/01/21     metFORMIN (GLUCOPHAGE-XR) 750 MG 24 hr tablet Take 2 tablets (1,500 mg total) by mouth daily with food 03/06/21     methocarbamol (ROBAXIN) 750 MG tablet Take 1 tablet (750 mg  total) by mouth every 8 hours as needed 11/22/20     Multiple Vitamin (MULTIVITAMIN WITH MINERALS) TABS tablet Take 1 tablet by mouth every other day. In the morning    [provider]  nystatin (MYCOSTATIN) 100000 UNIT/ML suspension Swish 15 mLs (1 tablespoon) by mouth 4 times daily then expectorate. 02/09/21     sulfamethoxazole-trimethoprim (BACTRIM DS) 800-160 MG tablet Take 1 tablet by mouth 2 (two) times daily. 03/01/21   Magnant, Charles L, PA-C  SUMAtriptan (IMITREX) 25 MG tablet TAKE 1 TABLET BY MOUTH AT ONSET OF MIGRAINE, MAY REPEAT IN 1 HR IF NEEDED 03/29/20 03/29/21  Seward Carol, MD  traMADol (ULTRAM) 50 MG tablet Take 1 tablet (50 mg total) by mouth every 6 (six) hours as needed. 02/16/21   Magnant, Gerrianne Scale, PA-C    Allergies    Lisinopril and Kiwi extract  Review of Systems   Review of Systems  Constitutional:  Negative for chills, fatigue and fever.  HENT:  Positive for sore throat. Negative for congestion, ear pain, rhinorrhea and sinus pressure.   Eyes:  Negative for redness.  Respiratory:  Positive for cough. Negative for wheezing.   Gastrointestinal:  Negative for abdominal pain, diarrhea, nausea and vomiting.  Genitourinary:  Negative for dysuria.  Musculoskeletal:  Negative for myalgias and neck stiffness.  Skin:  Negative for rash.  Neurological:  Negative for headaches.  Hematological:  Negative for adenopathy.   Physical Exam Updated Vital Signs BP 134/81   Pulse 94   Temp 99.5 F (37.5 C)   Resp 16   Ht 5' 3"  (1.6 m)   Wt 116 kg   SpO2 96%   BMI 45.30 kg/m   Physical Exam Vitals and nursing note reviewed.  Constitutional:  Appearance: She is well-developed.  HENT:     Head: Normocephalic and atraumatic.     Jaw: No trismus.     Right Ear: Tympanic membrane, ear canal and external ear normal.     Left Ear: Tympanic membrane, ear canal and external ear normal.     Nose: Nose normal. No mucosal edema or rhinorrhea.     Mouth/Throat:      Mouth: Mucous membranes are moist. Mucous membranes are not dry. No oral lesions.     Pharynx: Uvula midline. Posterior oropharyngeal erythema present. No oropharyngeal exudate or uvula swelling.     Tonsils: No tonsillar abscesses.     Comments: Patient has a strong gag reflex making exam with tongue depressor difficult.  No obvious exudates, uvular deviation or signs of abscess. Eyes:     General:        Right eye: No discharge.        Left eye: No discharge.     Conjunctiva/sclera: Conjunctivae normal.  Cardiovascular:     Rate and Rhythm: Normal rate and regular rhythm.     Heart sounds: Normal heart sounds.  Pulmonary:     Effort: Pulmonary effort is normal. No respiratory distress.     Breath sounds: Normal breath sounds. No wheezing or rales.  Abdominal:     Palpations: Abdomen is soft.     Tenderness: There is no abdominal tenderness.  Musculoskeletal:     Cervical back: Normal range of motion and neck supple.  Lymphadenopathy:     Cervical: No cervical adenopathy.  Skin:    General: Skin is warm and dry.  Neurological:     Mental Status: She is alert.  Psychiatric:        Mood and Affect: Mood normal.    ED Results / Procedures / Treatments   Labs (all labs ordered are listed, but only abnormal results are displayed) Labs Reviewed  CBG MONITORING, ED - Abnormal; Notable for the following components:      Result Value   Glucose-Capillary 117 (*)    All other components within normal limits  GROUP A STREP BY PCR  RESP PANEL BY RT-PCR (FLU A&B, COVID) [id number]    EKG None  Radiology No results found.  Procedures Procedures   Medications Ordered in ED Medications - No data to display  ED Course  I have reviewed the triage vital signs and the nursing notes.  Pertinent labs & imaging results that were available during my care of the patient were reviewed by me and considered in my medical decision making (see chart for details).  Patient seen and examined.  Plan discussed with patient.   Labs: Strep, flu, CBG  Imaging: None  Medications/Fluids: None  Vital signs reviewed and are as follows: BP 134/81   Pulse 94   Temp 99.5 F (37.5 C)   Resp 16   Ht 5' 3"  (1.6 m)   Wt 116 kg   SpO2 96%   BMI 45.30 kg/m    Flu, COVID, strep test negative.  Blood sugar reasonable at 117.  Discussed symptomatic care with patient.  Will give dose of IM Decadron after discussion of risks and benefits in setting of diabetes.  Fortunately, patient's blood sugars well controlled.  Hopefully this will help.  Otherwise, patient counseled on supportive care for viral pharyngitis.  Urged to see PCP if symptoms persist for more than 3 days. Discussed s/s to return including worsening symptoms, persistent fever, persistent vomiting, inability to swallow due to  pain, severe pain or if they have any other concerns. Patient verbalizes understanding and agrees with plan.      MDM Rules/Calculators/A&P                           Pharyngitis: Viral panel and strep negative.  Patient tolerating fluids.  Blood sugar controlled.  IM Decadron given.  Patient appears well, nontoxic.  We will continue to treat symptomatically.  Final Clinical Impression(s) / ED Diagnoses Final diagnoses:  Pharyngitis, unspecified etiology    Rx / DC Orders ED Discharge Orders     None        Suann Larry 07/13/21 2157    Truddie Hidden, MD 07/13/21 2321

## 2021-07-13 NOTE — ED Triage Notes (Signed)
Patient reports to the ER for tonsillar pain. States she has been having pain x2 days. Patinet reports it is hard to swallow

## 2021-07-13 NOTE — Discharge Instructions (Signed)
Please read and follow all provided instructions.  Your diagnoses today include:  1. Pharyngitis, unspecified etiology     Tests performed today include: Strep test: was negative for strep throat COVID/flu test: were negative as well Vital signs. See below for your results today.   Medications prescribed:  None  Home care instructions:  Please read the educational materials provided and follow any instructions contained in this packet.  Follow-up instructions: Please follow-up with your primary care provider as needed for further evaluation of your symptoms.  Return instructions:  Please return to the Emergency Department if you experience worsening symptoms.  Return if you have worsening problems swallowing, your neck becomes swollen, you cannot swallow your saliva or your voice becomes muffled.  Return with high persistent fever, persistent vomiting, or if you have trouble breathing.  Please return if you have any other emergent concerns.  Additional Information:  Your vital signs today were: BP 134/81   Pulse 94   Temp 99.5 F (37.5 C)   Resp 16   Ht 5\' 3"  (1.6 m)   Wt 116 kg   SpO2 96%   BMI 45.30 kg/m  If your blood pressure (BP) was elevated above 135/85 this visit, please have this repeated by your doctor within one month. --------------

## 2021-07-25 ENCOUNTER — Encounter: Payer: Self-pay | Admitting: Gastroenterology

## 2021-07-29 DIAGNOSIS — Z1231 Encounter for screening mammogram for malignant neoplasm of breast: Secondary | ICD-10-CM | POA: Diagnosis not present

## 2021-08-01 DIAGNOSIS — Z760 Encounter for issue of repeat prescription: Secondary | ICD-10-CM | POA: Diagnosis not present

## 2021-08-01 DIAGNOSIS — G4733 Obstructive sleep apnea (adult) (pediatric): Secondary | ICD-10-CM | POA: Diagnosis not present

## 2021-08-05 DIAGNOSIS — G4733 Obstructive sleep apnea (adult) (pediatric): Secondary | ICD-10-CM | POA: Diagnosis not present

## 2021-08-07 DIAGNOSIS — R002 Palpitations: Secondary | ICD-10-CM | POA: Diagnosis not present

## 2021-08-20 ENCOUNTER — Other Ambulatory Visit: Payer: Self-pay

## 2021-08-20 ENCOUNTER — Emergency Department (HOSPITAL_COMMUNITY)
Admission: EM | Admit: 2021-08-20 | Discharge: 2021-08-20 | Disposition: A | Discharge status: Home / Self Care | Payer: 59 | Attending: Emergency Medicine | Admitting: Emergency Medicine

## 2021-08-20 ENCOUNTER — Emergency Department (HOSPITAL_COMMUNITY): Payer: 59

## 2021-08-20 ENCOUNTER — Encounter (HOSPITAL_COMMUNITY): Payer: Self-pay

## 2021-08-20 DIAGNOSIS — Z20822 Contact with and (suspected) exposure to covid-19: Secondary | ICD-10-CM | POA: Diagnosis not present

## 2021-08-20 DIAGNOSIS — Z79899 Other long term (current) drug therapy: Secondary | ICD-10-CM | POA: Diagnosis not present

## 2021-08-20 DIAGNOSIS — R109 Unspecified abdominal pain: Secondary | ICD-10-CM | POA: Diagnosis not present

## 2021-08-20 DIAGNOSIS — Z7982 Long term (current) use of aspirin: Secondary | ICD-10-CM | POA: Insufficient documentation

## 2021-08-20 DIAGNOSIS — E119 Type 2 diabetes mellitus without complications: Secondary | ICD-10-CM | POA: Insufficient documentation

## 2021-08-20 DIAGNOSIS — N390 Urinary tract infection, site not specified: Secondary | ICD-10-CM | POA: Insufficient documentation

## 2021-08-20 DIAGNOSIS — I10 Essential (primary) hypertension: Secondary | ICD-10-CM | POA: Insufficient documentation

## 2021-08-20 DIAGNOSIS — Z794 Long term (current) use of insulin: Secondary | ICD-10-CM | POA: Diagnosis not present

## 2021-08-20 DIAGNOSIS — R197 Diarrhea, unspecified: Secondary | ICD-10-CM | POA: Diagnosis not present

## 2021-08-20 DIAGNOSIS — R110 Nausea: Secondary | ICD-10-CM | POA: Insufficient documentation

## 2021-08-20 DIAGNOSIS — B029 Zoster without complications: Secondary | ICD-10-CM | POA: Diagnosis not present

## 2021-08-20 DIAGNOSIS — K219 Gastro-esophageal reflux disease without esophagitis: Secondary | ICD-10-CM | POA: Insufficient documentation

## 2021-08-20 DIAGNOSIS — Z7984 Long term (current) use of oral hypoglycemic drugs: Secondary | ICD-10-CM | POA: Diagnosis not present

## 2021-08-20 DIAGNOSIS — R140 Abdominal distension (gaseous): Secondary | ICD-10-CM | POA: Insufficient documentation

## 2021-08-20 DIAGNOSIS — R1084 Generalized abdominal pain: Secondary | ICD-10-CM | POA: Diagnosis not present

## 2021-08-20 LAB — COMPREHENSIVE METABOLIC PANEL
ALT: 14 U/L (ref 0–44)
AST: 28 U/L (ref 15–41)
Albumin: 3.7 g/dL (ref 3.5–5.0)
Alkaline Phosphatase: 89 U/L (ref 38–126)
Anion gap: 4 — ABNORMAL LOW (ref 5–15)
BUN: 11 mg/dL (ref 8–23)
CO2: 27 mmol/L (ref 22–32)
Calcium: 8.3 mg/dL — ABNORMAL LOW (ref 8.9–10.3)
Chloride: 105 mmol/L (ref 98–111)
Creatinine, Ser: 0.81 mg/dL (ref 0.44–1.00)
GFR, Estimated: >60 mL/min (ref >60)
Glucose, Bld: 102 mg/dL — ABNORMAL HIGH (ref 70–99)
Potassium: 4.8 mmol/L (ref 3.5–5.1)
Sodium: 136 mmol/L (ref 135–145)
Total Bilirubin: 0.8 mg/dL (ref 0.3–1.2)
Total Protein: 7.8 g/dL (ref 6.5–8.1)

## 2021-08-20 LAB — URINALYSIS, ROUTINE W REFLEX MICROSCOPIC
Bilirubin Urine: NEGATIVE
Glucose, UA: >=500 mg/dL — AB
Hgb urine dipstick: NEGATIVE
Ketones, ur: 5 mg/dL — AB
Nitrite: NEGATIVE
Protein, ur: 30 mg/dL — AB
Specific Gravity, Urine: 1.03 (ref 1.005–1.030)
WBC, UA: >50 WBC/hpf — ABNORMAL HIGH (ref 0–5)
pH: 5 (ref 5.0–8.0)

## 2021-08-20 LAB — RESP PANEL BY RT-PCR (FLU A&B, COVID) ARPGX2
Influenza A by PCR: NEGATIVE
Influenza B by PCR: NEGATIVE
SARS Coronavirus 2 by RT PCR: NEGATIVE

## 2021-08-20 LAB — CBC
HCT: 44 % (ref 36.0–46.0)
Hemoglobin: 13.3 g/dL (ref 12.0–15.0)
MCH: 21.6 pg — ABNORMAL LOW (ref 26.0–34.0)
MCHC: 30.2 g/dL (ref 30.0–36.0)
MCV: 71.4 fL — ABNORMAL LOW (ref 80.0–100.0)
Platelets: 268 10*3/uL (ref 150–400)
RBC: 6.16 MIL/uL — ABNORMAL HIGH (ref 3.87–5.11)
RDW: 17.1 % — ABNORMAL HIGH (ref 11.5–15.5)
WBC: 6.1 10*3/uL (ref 4.0–10.5)
nRBC: 0 % (ref 0.0–0.2)

## 2021-08-20 LAB — LIPASE, BLOOD: Lipase: 28 U/L (ref 11–51)

## 2021-08-20 MED ORDER — IOHEXOL 350 MG/ML SOLN
100.0000 mL | Freq: Once | INTRAVENOUS | Status: AC | PRN
  Administered 2021-08-20: 100 mL via INTRAVENOUS

## 2021-08-20 MED ORDER — ACYCLOVIR 400 MG PO TABS: 400.0000 mg | ORAL_TABLET | Freq: Four times a day (QID) | ORAL | 0 refills | Status: AC

## 2021-08-20 MED ORDER — SODIUM CHLORIDE 0.9 % IV BOLUS
1000.0000 mL | Freq: Once | INTRAVENOUS | Status: AC
  Administered 2021-08-20: 1000 mL via INTRAVENOUS

## 2021-08-20 MED ORDER — CEPHALEXIN 500 MG PO CAPS: ORAL_CAPSULE | ORAL | 0 refills | Status: DC

## 2021-08-20 MED ORDER — ACYCLOVIR 200 MG PO CAPS
400.0000 mg | ORAL_CAPSULE | Freq: Once | ORAL | Status: AC
  Administered 2021-08-20: 400 mg via ORAL
  Filled 2021-08-20: qty 2

## 2021-08-20 MED ORDER — SODIUM CHLORIDE (PF) 0.9 % IJ SOLN
INTRAMUSCULAR | Status: AC
  Filled 2021-08-20: qty 50

## 2021-08-20 MED ORDER — SODIUM CHLORIDE 0.9 % IV SOLN
1.0000 g | Freq: Once | INTRAVENOUS | Status: AC
  Administered 2021-08-20: 1 g via INTRAVENOUS
  Filled 2021-08-20: qty 10

## 2021-08-20 NOTE — Discharge Instructions (Addendum)
You have been evaluated for your symptoms.  It appears you have a urinary tract infection.  Please take antibiotic as prescribed.  Your rash is related to shingles, take antiviral medication as well.  Follow-up with your primary care provider for further evaluation.  Return if you have any concern.

## 2021-08-20 NOTE — ED Provider Notes (Signed)
[facility] DEPT Provider Note   CSN: [id number] Arrival date & time: 08/20/21  1025     History  Chief Complaint  Patient presents with   Abdominal Pain    Andrea Montes is a 66 y.o. female.  The history is provided by the patient and medical records. No language interpreter was used.  Abdominal Pain  66 year old female significant history of obesity, anemia, hypertension, diabetes, GERD presenting for evaluation of abdominal pain.  For the past week patient has had recurrent abdominal pain.  She described as a crampy uncomfortable sensation follows with nausea, indigestion, along with recurrent loose stools.  Sometimes eating certain food seems to trigger some symptoms.  She noticed decreased urinary output states she normally pees quite often.  She denies any burning sensation when urinating.  She does not complain of any fever or chills no chest pain shortness of breath no runny nose sneezing or coughing no dysuria.  She did not notice any blood in the stool.  No prior abdominal surgeries.  She is able to pass flatus.  She went to urgent care for evaluation today but was sent here for further assessment.  Patient also complaining of a burning rash to her right side of her back that started 3 days ago.  She felt symptoms very similar to prior shingles that she has had in the past.  Home Medications Prior to Admission medications   Medication Sig Start Date End Date Taking? Authorizing Provider  Aspirin-Acetaminophen-Caffeine (GOODY HEADACHE PO) Take 1 Package by mouth as needed (pain).    [provider]  atorvastatin (LIPITOR) 10 MG tablet TAKE 1 TABLET (10 MG TOTAL) BY MOUTH AT BEDTIME. 10/27/20 10/27/21  Josue Hector, MD  Blood Pressure Monitoring (OMRON 3 SERIES BP MONITOR) DEVI  10/05/19   [provider]  calcium carbonate (TUMS EX) 750 MG chewable tablet Chew 1-2 tablets by mouth 2 (two) times daily as needed for heartburn.     [provider]  Cholecalciferol (VITAMIN D3) 50 MCG (2000 UT) TABS Take 2,000 Units by mouth every Friday.    [provider]  COVID-19 At Home Antigen Test [facility] COVID-19 HOME TEST) KIT Use as directed 07/12/21   Jefm Bryant, RPH  empagliflozin (JARDIANCE) 25 MG TABS tablet Take 1 tablet (25 mg total) by mouth daily. 06/23/20   Seward Carol, MD  empagliflozin (JARDIANCE) 25 MG TABS tablet Take 1 tablet (25 mg total) by mouth daily. 03/06/21     FREESTYLE LITE test strip  12/17/19   [provider]  furosemide (LASIX) 20 MG tablet Take 1 tablet (20 mg total) by mouth daily. 03/01/21   Josue Hector, MD  furosemide (LASIX) 20 MG tablet Take 1 tablet (20 mg total) by mouth daily. 03/06/21     insulin glargine-yfgn (SEMGLEE) 100 UNIT/ML Pen INJECT 10 UNITS ONCE A DAY AT BEDTIME SUBCUTANEOUSLY 10/17/20 10/17/21  Seward Carol, MD  Lancets (FREESTYLE) lancets 1 each 3 (three) times daily. 12/17/19   [provider]  losartan (COZAAR) 50 MG tablet Take 1 tablet (50 mg total) by mouth daily. 10/07/19   Josue Hector, MD  losartan (COZAAR) 50 MG tablet TAKE 1 TABLET BY MOUTH ONCE A DAY 06/23/20 06/23/21  Seward Carol, MD  losartan (COZAAR) 50 MG tablet Take 1 tablet (50 mg total) by mouth daily. 03/06/21     meloxicam (MOBIC) 15 MG tablet Take 1 tablet (15 mg total) by mouth daily as needed for pain. 05/02/17  Lilia Argue R, DO  metFORMIN (GLUCOPHAGE-XR) 750 MG 24 hr tablet TAKE 2 TABLETS BY MOUTH DAILY WTIH FOOD. 02/19/20 02/18/21  Seward Carol, MD  metFORMIN (GLUCOPHAGE-XR) 750 MG 24 hr tablet Take 2 tablets (1,500 mg total) by mouth daily with food. 03/01/21     metFORMIN (GLUCOPHAGE-XR) 750 MG 24 hr tablet Take 2 tablets (1,500 mg total) by mouth daily with food 03/06/21     methocarbamol (ROBAXIN) 750 MG tablet Take 1 tablet (750 mg total) by mouth every 8 hours as needed 11/22/20     Multiple Vitamin (MULTIVITAMIN WITH MINERALS) TABS tablet Take 1 tablet by  mouth every other day. In the morning    [provider]  nystatin (MYCOSTATIN) 100000 UNIT/ML suspension Swish 15 mLs (1 tablespoon) by mouth 4 times daily then expectorate. 02/09/21     sulfamethoxazole-trimethoprim (BACTRIM DS) 800-160 MG tablet Take 1 tablet by mouth 2 (two) times daily. 03/01/21   Magnant, Charles L, PA-C  SUMAtriptan (IMITREX) 25 MG tablet TAKE 1 TABLET BY MOUTH AT ONSET OF MIGRAINE, MAY REPEAT IN 1 HR IF NEEDED 03/29/20 03/29/21  Seward Carol, MD  traMADol (ULTRAM) 50 MG tablet Take 1 tablet (50 mg total) by mouth every 6 (six) hours as needed. 02/16/21   Magnant, Gerrianne Scale, PA-C      Allergies    Lisinopril and Kiwi extract    Review of Systems   Review of Systems  Gastrointestinal:  Positive for abdominal pain.  All other systems reviewed and are negative.  Physical Exam Updated Vital Signs BP (!) 158/94 (BP Location: Left Arm)    Pulse 65    Temp 98 F (36.7 C) (Oral)    Resp 16    Ht _0  (1.6 m)    Wt 116.1 kg    SpO2 99%    BMI 45.35 kg/m  Physical Exam Vitals and nursing note reviewed.  Constitutional:      General: She is not in acute distress.    Appearance: She is well-developed. She is obese.  HENT:     Head: Atraumatic.  Eyes:     Conjunctiva/sclera: Conjunctivae normal.  Cardiovascular:     Rate and Rhythm: Normal rate and regular rhythm.  Pulmonary:     Effort: Pulmonary effort is normal.  Abdominal:     Palpations: Abdomen is soft.     Tenderness: There is abdominal tenderness (Mild periumbilical abdominal tenderness on exam no guarding rebound tenderness.) in the periumbilical area.  Musculoskeletal:     Cervical back: Neck supple.  Skin:    Findings: No rash.  Neurological:     Mental Status: She is alert.  Psychiatric:        Mood and Affect: Mood normal.    ED Results / Procedures / Treatments   Labs (all labs ordered are listed, but only abnormal results are displayed) Labs Reviewed  COMPREHENSIVE METABOLIC PANEL -  Abnormal; Notable for the following components:      Result Value   Glucose, Bld 102 (*)    Calcium 8.3 (*)    Anion gap 4 (*)    All other components within normal limits  CBC - Abnormal; Notable for the following components:   RBC 6.16 (*)    MCV 71.4 (*)    MCH 21.6 (*)    RDW 17.1 (*)    All other components within normal limits  URINALYSIS, ROUTINE W REFLEX MICROSCOPIC - Abnormal; Notable for the following components:   APPearance HAZY (*)  Glucose, UA >=500 (*)    Ketones, ur 5 (*)    Protein, ur 30 (*)    Leukocytes,Ua LARGE (*)    WBC, UA >50 (*)    Bacteria, UA RARE (*)    All other components within normal limits  RESP PANEL BY RT-PCR (FLU A&B, COVID) ARPGX2  LIPASE, BLOOD    EKG EKG Interpretation  Date/Time:  Sunday August 20 2021 11:40:23 EST Ventricular Rate:  57 PR Interval:  166 QRS Duration: 95 QT Interval:  444 QTC Calculation: 433 R Axis:   22 Text Interpretation: Sinus rhythm Low voltage, precordial leads Confirmed by Lacretia Leigh ([id number]) on 08/20/2021 12:27:57 PM  Radiology CT ABDOMEN PELVIS W CONTRAST  Result Date: 08/20/2021 CLINICAL DATA:  Mid abdominal pain for 6 days.  Lots of gas. EXAM: CT ABDOMEN AND PELVIS WITH CONTRAST TECHNIQUE: Multidetector CT imaging of the abdomen and pelvis was performed using the standard protocol following bolus administration of intravenous contrast. CONTRAST:  158m OMNIPAQUE IOHEXOL 350 MG/ML SOLN COMPARISON:  07/14/2017 FINDINGS: Lower chest: Clear lung bases. Hepatobiliary: No focal liver abnormality is seen. No gallstones, gallbladder wall thickening, or biliary dilatation. Pancreas: Unremarkable. No pancreatic ductal dilatation or surrounding inflammatory changes. Spleen: Normal in size without focal abnormality. Adrenals/Urinary Tract: No adrenal masses. Kidneys normal in size, orientation and position with symmetric enhancement and excretion. 9 mm low-density lesion, posterior midpole of the left kidney,  consistent with a cyst. Left renal sinus cysts. Small nonobstructing stone in the lower pole the right kidney. No hydronephrosis. Normal ureters. Normal bladder. Stomach/Bowel: Stomach is within normal limits. Appendix appears normal. No evidence of bowel wall thickening, distention, or inflammatory changes. Vascular/Lymphatic: No significant vascular findings are present. No enlarged abdominal or pelvic lymph nodes. Reproductive: Status post hysterectomy. No adnexal masses. Other: No abdominal wall hernia or abnormality. No abdominopelvic ascites. Musculoskeletal: No fracture or acute finding.  No bone lesion. IMPRESSION: 1. No acute findings. No findings to account for the patient's abdominal pain. Electronically Signed   By: DLajean ManesM.D.   On: 08/20/2021 12:38    Procedures Procedures    Medications Ordered in ED Medications  sodium chloride (PF) 0.9 % injection (has no administration in time range)  acyclovir (ZOVIRAX) 200 MG capsule 400 mg (400 mg Oral Given 08/20/21 1339)  sodium chloride 0.9 % bolus 1,000 mL (1,000 mLs Intravenous New Bag/Given 08/20/21 1129)  cefTRIAXone (ROCEPHIN) 1 g in sodium chloride 0.9 % 100 mL IVPB (0 g Intravenous Stopped 08/20/21 1200)  iohexol (OMNIPAQUE) 350 MG/ML injection 100 mL (100 mLs Intravenous Contrast Given 08/20/21 1210)    ED Course/ Medical Decision Making/ A&P                           Medical Decision Making  BP (!) 158/94 (BP Location: Left Arm)    Pulse 65    Temp 98 F (36.7 C) (Oral)    Resp 16    Ht _0  (1.6 m)    Wt 116.1 kg    SpO2 99%    BMI 45.35 kg/m   11:23 AM This is an obese patient with multiple comorbidity which includes diabetes obesity GERD who presents with ongoing abdominal discomfort with persistent diarrhea and nausea with bloating for nearly a week.  Symptoms suggestive of indigestion but given her comorbidity, and tenderness to periumbilical region on exam, will obtain labs and abdominal pelvis CT scan for further  assessment.  I have considered  cardiac as a source but suspect less likely, will obtain EKG.  She does not have any significant cold symptoms.  Is having persistent diarrhea, low suspicion for SBO.  I have considered C. difficile but felt that it's not likely, no recent abx use.  she has had prior hysterectomy, low suspicion for GU source.  Have reviewed her prior notes and considered in my plan of care  11:25 AM Urinalysis obtained and reviewed by me.  It has hazy appearance with large leukocyte esterase and greater than 50 WBC.  This could suggest an underlying urinary tract infection.  Patient did noted that she has decreased urinary production as compared to prior but without any burning urination.  Urine culture sent, will give Rocephin as treatment  2:05 PM Labs were reviewed and independently interpreted by me.  Patient has normal lipase, labs overall reassuring, negative viral respiratory panel, and abdominal pelvis CT scan show no acute finding.  2:44 PM Patient received IV fluid, on reassessment she appears more comfortable, no significant tenderness abdominal exam.  Will discharge home with Keflex as treatment for UTI.  Outpatient follow-up recommended.  Return precaution given.  Patient will be treated for shingle with acyclovir.        Final Clinical Impression(s) / ED Diagnoses Final diagnoses:  Lower urinary tract infectious disease  Diarrhea, unspecified type  Herpes zoster without complication    Rx / DC Orders ED Discharge Orders          Ordered    cephALEXin (KEFLEX) 500 MG capsule        08/20/21 1449    acyclovir (ZOVIRAX) 400 MG tablet  4 times daily        08/20/21 1449              Domenic Moras, PA-C 08/20/21 1451    Lacretia Leigh, MD 08/23/21 1310

## 2021-08-20 NOTE — ED Triage Notes (Signed)
Patient c/o mid abdominal pain x 6 days. Patient denies N/v/d. Patient states, "lots of gas" Patient reports rash/shingles on back x 4 days.  Patient went to an UC today and was referred to the ED.

## 2021-08-25 ENCOUNTER — Other Ambulatory Visit (HOSPITAL_COMMUNITY): Payer: Self-pay

## 2021-09-05 DIAGNOSIS — Z7984 Long term (current) use of oral hypoglycemic drugs: Secondary | ICD-10-CM | POA: Diagnosis not present

## 2021-09-05 DIAGNOSIS — E7800 Pure hypercholesterolemia, unspecified: Secondary | ICD-10-CM | POA: Diagnosis not present

## 2021-09-05 DIAGNOSIS — L680 Hirsutism: Secondary | ICD-10-CM | POA: Diagnosis not present

## 2021-09-05 DIAGNOSIS — G4733 Obstructive sleep apnea (adult) (pediatric): Secondary | ICD-10-CM | POA: Diagnosis not present

## 2021-09-05 DIAGNOSIS — E119 Type 2 diabetes mellitus without complications: Secondary | ICD-10-CM | POA: Diagnosis not present

## 2021-09-05 DIAGNOSIS — Z90710 Acquired absence of both cervix and uterus: Secondary | ICD-10-CM | POA: Diagnosis not present

## 2021-09-05 DIAGNOSIS — E1169 Type 2 diabetes mellitus with other specified complication: Secondary | ICD-10-CM | POA: Diagnosis not present

## 2021-09-05 DIAGNOSIS — I10 Essential (primary) hypertension: Secondary | ICD-10-CM | POA: Diagnosis not present

## 2021-09-05 DIAGNOSIS — K5900 Constipation, unspecified: Secondary | ICD-10-CM | POA: Diagnosis not present

## 2021-09-05 DIAGNOSIS — Z01419 Encounter for gynecological examination (general) (routine) without abnormal findings: Secondary | ICD-10-CM | POA: Diagnosis not present

## 2021-10-02 ENCOUNTER — Other Ambulatory Visit (HOSPITAL_COMMUNITY): Payer: Self-pay

## 2021-10-02 MED ORDER — FREESTYLE LITE W/DEVICE KIT
1.0000 | PACK | 0 refills | Status: DC
  Filled 2021-10-02: qty 1, 1d supply, fill #0

## 2021-10-02 MED ORDER — OZEMPIC (0.25 OR 0.5 MG/DOSE) 2 MG/1.5ML ~~LOC~~ SOPN
0.2500 mg | PEN_INJECTOR | SUBCUTANEOUS | 11 refills | Status: DC
Start: 2021-10-02 — End: 2021-12-15
  Filled 2021-10-02: qty 1.5, 56d supply, fill #0
  Filled 2021-10-03 (×2): qty 1.5, 42d supply, fill #0
  Filled 2021-11-15: qty 1.5, 42d supply, fill #1
  Filled 2021-12-14: qty 4.5, 84d supply, fill #2

## 2021-10-02 MED FILL — Insulin Glargine-yfgn Soln Pen-Injector 100 Unit/ML: SUBCUTANEOUS | 90 days supply | Qty: 9 | Fill #1 | Status: CN

## 2021-10-02 MED FILL — Atorvastatin Calcium Tab 10 MG (Base Equivalent): ORAL | 90 days supply | Qty: 90 | Fill #1 | Status: AC

## 2021-10-03 ENCOUNTER — Other Ambulatory Visit (HOSPITAL_COMMUNITY): Payer: Self-pay

## 2021-10-04 ENCOUNTER — Other Ambulatory Visit (HOSPITAL_COMMUNITY): Payer: Self-pay

## 2021-10-04 MED ORDER — INSULIN PEN NEEDLE 31G X 5 MM MISC
3 refills | Status: AC
  Filled 2021-10-04: qty 100, 90d supply, fill #0
  Filled 2021-10-04: qty 100, 100d supply, fill #0
  Filled 2021-10-05 (×2): qty 100, 90d supply, fill #0
  Filled 2022-01-12: qty 100, 90d supply, fill #1

## 2021-10-05 ENCOUNTER — Other Ambulatory Visit (HOSPITAL_COMMUNITY): Payer: Self-pay

## 2021-10-06 DIAGNOSIS — G4733 Obstructive sleep apnea (adult) (pediatric): Secondary | ICD-10-CM | POA: Diagnosis not present

## 2021-10-09 ENCOUNTER — Encounter: Payer: Self-pay | Admitting: Gastroenterology

## 2021-10-09 ENCOUNTER — Other Ambulatory Visit (HOSPITAL_COMMUNITY): Payer: Self-pay

## 2021-10-09 ENCOUNTER — Ambulatory Visit (AMBULATORY_SURGERY_CENTER): Payer: 59 | Admitting: *Deleted

## 2021-10-09 ENCOUNTER — Other Ambulatory Visit: Payer: Self-pay

## 2021-10-09 VITALS — Ht 63.0 in | Wt 260.0 lb

## 2021-10-09 DIAGNOSIS — Z86010 Personal history of colonic polyps: Secondary | ICD-10-CM

## 2021-10-09 MED ORDER — NA SULFATE-K SULFATE-MG SULF 17.5-3.13-1.6 GM/177ML PO SOLN
1.0000 | Freq: Once | ORAL | 0 refills | Status: AC
  Filled 2021-10-09: qty 354, 1d supply, fill #0

## 2021-10-09 NOTE — Progress Notes (Signed)
No egg or soy allergy known to patient   issues known to pt with past sedation with any surgeries or procedures of itching  Patient denies ever being told they had issues or difficulty with intubation  No FH of Malignant Hyperthermia Pt is not on diet pills Pt is not on  home 02  Pt is not on blood thinners  Pt states issues with constipation at times, not daily - other times has diarrhea- pt ? IBS  No A fib or A flutter  Pt is fully vaccinated  for Covid    NO PA's for preps discussed with pt In PV today  Discussed with pt there will be an out-of-pocket cost for prep and that varies from $0 to 70 +  dollars - pt verbalized understanding   Due to the COVID-19 pandemic we are asking patients to follow certain guidelines in PV and the [facility]   Pt aware of COVID protocols and [facility] guidelines   PV completed over the phone. Pt verified name, DOB, address and insurance during PV today.  Pt encouraged to call with questions or issues.  If pt has My chart, procedure instructions sent via My Chart  Emailed intructions to [email or web address]

## 2021-10-16 ENCOUNTER — Other Ambulatory Visit: Payer: Self-pay

## 2021-10-16 ENCOUNTER — Ambulatory Visit (AMBULATORY_SURGERY_CENTER): Payer: 59 | Admitting: Gastroenterology

## 2021-10-16 ENCOUNTER — Encounter: Payer: Self-pay | Admitting: Gastroenterology

## 2021-10-16 VITALS — BP 107/58 | HR 53 | Temp 97.1°F | Resp 14

## 2021-10-16 DIAGNOSIS — Z1211 Encounter for screening for malignant neoplasm of colon: Secondary | ICD-10-CM | POA: Diagnosis not present

## 2021-10-16 DIAGNOSIS — D123 Benign neoplasm of transverse colon: Secondary | ICD-10-CM | POA: Diagnosis not present

## 2021-10-16 DIAGNOSIS — Z86010 Personal history of colonic polyps: Secondary | ICD-10-CM | POA: Diagnosis not present

## 2021-10-16 MED ORDER — SODIUM CHLORIDE 0.9 % IV SOLN: 500.0000 mL | Freq: Once | INTRAVENOUS | Status: DC

## 2021-10-16 NOTE — Progress Notes (Signed)
Called to room to assist during endoscopic procedure.  Patient ID and intended procedure confirmed with present staff. Received instructions for my participation in the procedure from the performing physician.  

## 2021-10-16 NOTE — Patient Instructions (Signed)
Handouts on hemorrhoids and polyps given.     YOU HAD AN ENDOSCOPIC PROCEDURE TODAY AT [facility]:   Refer to the procedure report that was given to you for any specific questions about what was found during the examination.  If the procedure report does not answer your questions, please call your gastroenterologist to clarify.  If you requested that your care partner not be given the details of your procedure findings, then the procedure report has been included in a sealed envelope for you to review at your convenience later.  YOU SHOULD EXPECT: Some feelings of bloating in the abdomen. Passage of more gas than usual.  Walking can help get rid of the air that was put into your GI tract during the procedure and reduce the bloating. If you had a lower endoscopy (such as a colonoscopy or flexible sigmoidoscopy) you may notice spotting of blood in your stool or on the toilet paper. If you underwent a bowel prep for your procedure, you may not have a normal bowel movement for a few days.  Please Note:  You might notice some irritation and congestion in your nose or some drainage.  This is from the oxygen used during your procedure.  There is no need for concern and it should clear up in a day or so.  SYMPTOMS TO REPORT IMMEDIATELY:  Following lower endoscopy (colonoscopy or flexible sigmoidoscopy):  Excessive amounts of blood in the stool  Significant tenderness or worsening of abdominal pains  Swelling of the abdomen that is new, acute  Fever of 100F or higher   For urgent or emergent issues, a gastroenterologist can be reached at any hour by calling [phone]. Do not use MyChart messaging for urgent concerns.    DIET:  We do recommend a small meal at first, but then you may proceed to your regular diet.  Drink plenty of fluids but you should avoid alcoholic beverages for 24 hours.  ACTIVITY:  You should plan to take it easy for the rest of today and you should NOT  DRIVE or use heavy machinery until tomorrow (because of the sedation medicines used during the test).    FOLLOW UP: Our staff will call the number listed on your records 48-72 hours following your procedure to check on you and address any questions or concerns that you may have regarding the information given to you following your procedure. If we do not reach you, we will leave a message.  We will attempt to reach you two times.  During this call, we will ask if you have developed any symptoms of COVID 19. If you develop any symptoms (ie: fever, flu-like symptoms, shortness of breath, cough etc.) before then, please call [phone].  If you test positive for Covid 19 in the 2 weeks post procedure, please call and report this information to [name].    If any biopsies were taken you will be contacted by phone or by letter within the next 1-3 weeks.  Please call us at [phone] if you have not heard about the biopsies in 3 weeks.    SIGNATURES/CONFIDENTIALITY: You and/or your care partner have signed paperwork which will be entered into your electronic medical record.  These signatures attest to the fact that that the information above on your After Visit Summary has been reviewed and is understood.  Full responsibility of the confidentiality of this discharge information lies with you and/or your care-partner.

## 2021-10-16 NOTE — Progress Notes (Signed)
Vitals-DT  Pt's states no medical or surgical changes since previsit or office visit.  

## 2021-10-16 NOTE — Progress Notes (Signed)
[facility] History and Physical   Primary Care Physician:  Seward Carol, MD   Reason for Procedure:  History of adenomatous colon polyps  Plan:    Surveillance colonoscopy with possible interventions as needed     HPI: Andrea Montes is a very pleasant 66 y.o. female here for surveillance colonoscopy. Denies any nausea, vomiting, abdominal pain, melena or bright red blood per rectum  The risks and benefits as well as alternatives of endoscopic procedure(s) have been discussed and reviewed. All questions answered. The patient agrees to proceed.    Past Medical History:  Diagnosis Date   Anemia    no current med.   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   GERD (gastroesophageal reflux disease)    Heart murmur    last  echo- 2010   History of blood transfusion    History of shingles    Hyperlipidemia    Hypertension    borderline not on medicaiton at this time   Impingement syndrome of left shoulder region 10/2014   Joint pain    Migraines    Obesity    Rheumatoid arteritis (HCC)    Seasonal allergies    Sleep apnea    uses CPAP nightly   Trigger finger, left middle finger    Vitamin D deficiency    Wears partial dentures    upper    Past Surgical History:  Procedure Laterality Date   CARPAL TUNNEL RELEASE Left 04/25/2006   CARPAL TUNNEL RELEASE Right 07/15/2018   Procedure: RIGHT CARPAL TUNNEL RELEASE;  Surgeon:  Meredith Pel, MD;  Location: [facility];  Service: Orthopedics;  Laterality: Right;   CLOSED MANIPULATION SHOULDER WITH STERIOD INJECTION Left 02/03/2015   Procedure: LEFT  MANIPULATION SHOULDER UNDER ANESTHESIA WITH INJECTION OF STEROID;  Surgeon: Kathryne Hitch, MD;  Location: [facility];  Service: Orthopedics;  Laterality: Left;   COLONOSCOPY     COLONOSCOPY WITH PROPOFOL  10/09/2011   POLYPECTOMY     RESECTION DISTAL CLAVICAL Left 11/04/2014   Procedure: RESECTION DISTAL CLAVICAL;  Surgeon: Kathryne Hitch, MD;  Location: [facility];  Service: Orthopedics;  Laterality: Left;   ROTATOR CUFF REPAIR  2010   SHOULDER ARTHROSCOPY WITH ROTATOR CUFF REPAIR Right 10/04/2008   SHOULDER ARTHROSCOPY WITH SUBACROMIAL DECOMPRESSION, ROTATOR CUFF REPAIR AND BICEP TENDON REPAIR Left 11/04/2014   Procedure: LEFT SHOULDER ARTHROSCOPY WITH DEBRIDEMENT, DISTAL CLAVICLE EXCISION, ACROMIOPLASTY, ROTATOR CUFF REPAIR AND BICEP TENODESIS;  Surgeon: Kathryne Hitch, MD;  Location: [facility];  Service: Orthopedics;  Laterality: Left;   TRIGGER FINGER RELEASE Right 02/03/2015   Procedure: RIGHT LONG FINGER TRIGGER RELEASE;  Surgeon: Kathryne Hitch, MD;  Location: [facility];  Service: Orthopedics;  Laterality: Right;   TRIGGER FINGER RELEASE Left 10/10/2017   Procedure: LEFT HAND RELEASE TRIGGER FINGER 3RD LONG FINGER AND CYST EXCISION;  Surgeon:  Meredith Pel, MD;  Location: [facility];  Service: Orthopedics;  Laterality: Left;   TRIGGER FINGER RELEASE Right 02/16/2021   Procedure: TRIGGER FINGER RELEASE RIGHT RING FINGER;  Surgeon: Meredith Pel, MD;  Location: [facility];  Service: Orthopedics;  Laterality: Right;   TUBAL LIGATION     VAGINAL HYSTERECTOMY  2001   partial    Prior to Admission medications   Medication Sig Start Date End Date Taking? Authorizing Provider  atorvastatin (LIPITOR) 10 MG tablet TAKE 1 TABLET (10 MG TOTAL) BY MOUTH AT BEDTIME. 10/27/20  01/01/22 Yes Josue Hector, MD  Blood Glucose Monitoring Suppl (FREESTYLE LITE) w/Device KIT Use to test your blood sugar as directed 10/02/21  Yes   Blood Pressure Monitoring (OMRON 3 SERIES BP MONITOR) DEVI  10/05/19  Yes [provider]  Cholecalciferol (VITAMIN D3) 50 MCG (2000 UT) TABS Take 2,000 Units by mouth every Friday.   Yes [provider]  empagliflozin (JARDIANCE) 25 MG TABS tablet Take 1 tablet (25 mg total) by mouth daily. 03/06/21  Yes   FREESTYLE LITE test strip  12/17/19  Yes [provider]  furosemide (LASIX) 20 MG tablet Take 1 tablet (20 mg total) by mouth daily. 03/06/21  Yes   insulin glargine-yfgn (SEMGLEE) 100 UNIT/ML Pen INJECT 10 UNITS ONCE A DAY AT BEDTIME SUBCUTANEOUSLY 10/17/20 10/17/21 Yes Polite, Jori Moll, MD  Insulin Pen Needle 31G X 5 MM MISC Use once daily as directed 10/04/21  Yes   Lancets (FREESTYLE) lancets 1 each 3 (three) times daily. 12/17/19  Yes [provider]  losartan (COZAAR) 50 MG tablet Take 1 tablet (50 mg total) by mouth daily. 03/06/21  Yes   meloxicam (MOBIC) 15 MG tablet Take 1 tablet (15 mg total) by mouth daily as needed for pain. 05/02/17  Yes Draper, Carlos Levering, DO  metFORMIN (GLUCOPHAGE-XR) 750 MG 24 hr tablet Take 2 tablets (1,500 mg total) by mouth daily with food Patient taking differently: Take 750 mg by mouth in the morning and at bedtime. 03/06/21  Yes   Semaglutide,0.25 or 0.5MG/DOS, (OZEMPIC, 0.25 OR 0.5 MG/DOSE,) 2 MG/1.5ML SOPN Inject 0.25 mg into the skin once a week for 4 weeks then increase to 0.5 mg once a week thereafter 10/02/21  Yes   acyclovir (ZOVIRAX) 400 MG tablet Take 1 tablet (400 mg total) by mouth 4 (four) times daily. Patient not taking: Reported on 10/09/2021 08/20/21   Domenic Moras, PA-C  Aspirin-Acetaminophen-Caffeine (GOODY HEADACHE PO) Take 1 Package by mouth as needed (pain).    [provider]  calcium carbonate (TUMS EX) 750 MG chewable tablet Chew 1-2 tablets by mouth 2 (two)  times daily as needed for heartburn.    [provider]  COVID-19 At Home Antigen Test [facility] COVID-19 HOME TEST) KIT Use as directed 07/12/21   Jefm Bryant, RPH  empagliflozin (JARDIANCE) 25 MG TABS tablet Take 1 tablet (25 mg total) by mouth daily. 06/23/20   Seward Carol, MD  methocarbamol (ROBAXIN) 750 MG tablet Take 1 tablet (750 mg total) by mouth every 8 hours as needed 11/22/20     Multiple Vitamin (MULTIVITAMIN WITH MINERALS) TABS tablet Take 1 tablet by mouth every other day. In the morning    [provider]  SUMAtriptan (IMITREX) 25 MG tablet TAKE 1 TABLET BY MOUTH AT ONSET OF MIGRAINE, MAY REPEAT IN 1 HR IF NEEDED 03/29/20 10/09/21  Seward Carol, MD  traMADol (ULTRAM) 50 MG tablet Take 1 tablet (50 mg total) by mouth  every 6 (six) hours as needed. 02/16/21   Magnant, Gerrianne Scale, PA-C    Current Outpatient Medications  Medication Sig Dispense Refill   atorvastatin (LIPITOR) 10 MG tablet TAKE 1 TABLET (10 MG TOTAL) BY MOUTH AT BEDTIME. 90 tablet 3   Blood Glucose Monitoring Suppl (FREESTYLE LITE) w/Device KIT Use to test your blood sugar as directed 1 kit 0   Blood Pressure Monitoring (OMRON 3 SERIES BP MONITOR) DEVI      Cholecalciferol (VITAMIN D3) 50 MCG (2000 UT) TABS Take 2,000 Units by mouth every Friday.     empagliflozin (JARDIANCE) 25 MG TABS tablet Take 1 tablet (25 mg total) by mouth daily. 90 tablet 3   FREESTYLE LITE test strip      furosemide (LASIX) 20 MG tablet Take 1 tablet (20 mg total) by mouth daily. 90 tablet 3   insulin glargine-yfgn (SEMGLEE) 100 UNIT/ML Pen INJECT 10 UNITS ONCE A DAY AT BEDTIME SUBCUTANEOUSLY 15 mL 5   Insulin Pen Needle 31G X 5 MM MISC Use once daily as directed 100 each 3   Lancets (FREESTYLE) lancets 1 each 3 (three) times daily.     losartan (COZAAR) 50 MG tablet Take 1 tablet (50 mg total) by mouth daily. 90 tablet 3   meloxicam (MOBIC) 15 MG tablet Take 1 tablet (15 mg total) by mouth daily as needed for pain. 60  tablet 1   metFORMIN (GLUCOPHAGE-XR) 750 MG 24 hr tablet Take 2 tablets (1,500 mg total) by mouth daily with food (Patient taking differently: Take 750 mg by mouth in the morning and at bedtime.) 180 tablet 3   Semaglutide,0.25 or 0.5MG/DOS, (OZEMPIC, 0.25 OR 0.5 MG/DOSE,) 2 MG/1.5ML SOPN Inject 0.25 mg into the skin once a week for 4 weeks then increase to 0.5 mg once a week thereafter 1.5 mL 11   acyclovir (ZOVIRAX) 400 MG tablet Take 1 tablet (400 mg total) by mouth 4 (four) times daily. (Patient not taking: Reported on 10/09/2021) 50 tablet 0   Aspirin-Acetaminophen-Caffeine (GOODY HEADACHE PO) Take 1 Package by mouth as needed (pain).     calcium carbonate (TUMS EX) 750 MG chewable tablet Chew 1-2 tablets by mouth 2 (two) times daily as needed for heartburn.     COVID-19 At Home Antigen Test [facility] COVID-19 HOME TEST) KIT Use as directed 4 each 0   empagliflozin (JARDIANCE) 25 MG TABS tablet Take 1 tablet (25 mg total) by mouth daily. 90 tablet 3   methocarbamol (ROBAXIN) 750 MG tablet Take 1 tablet (750 mg total) by mouth every 8 hours as needed 30 tablet 1   Multiple Vitamin (MULTIVITAMIN WITH MINERALS) TABS tablet Take 1 tablet by mouth every other day. In the morning     SUMAtriptan (IMITREX) 25 MG tablet TAKE 1 TABLET BY MOUTH AT ONSET OF MIGRAINE, MAY REPEAT IN 1 HR IF NEEDED 15 tablet 5   traMADol (ULTRAM) 50 MG tablet Take 1 tablet (50 mg total) by mouth every 6 (six) hours as needed. 30 tablet 0   Current Facility-Administered Medications  Medication Dose Route Frequency Provider Last Rate Last Admin   0.9 %  sodium chloride infusion  500 mL Intravenous Continuous Monaca Wadas V, MD       0.9 %  sodium chloride infusion  500 mL Intravenous Once Mauri Pole, MD        Allergies as of 10/16/2021 - Review Complete 10/16/2021  Allergen Reaction Noted   Lisinopril Shortness Of Breath and Other (See Comments) 09/13/2020  Kiwi extract Itching and Other (See Comments)  07/09/2018   Pineapple flavor Itching 09/05/2021    Family History  Problem Relation Age of Onset   Kidney disease Brother    Hypertension Brother    Leukemia Mother    Heart disease Mother    Stroke Mother    Cancer Mother    Depression Mother    Anxiety disorder Mother    AAA (abdominal aortic aneurysm) Father    Esophageal cancer Maternal Uncle    Hypertension Brother    Colon cancer Neg Hx    Colon polyps Neg Hx    Rectal cancer Neg Hx    Stomach cancer Neg Hx     Social History   Socioeconomic History   Marital status: Legally Separated    Spouse name: Not on file   Number of children: Not on file   Years of education: Not on file   Highest education level: Not on file  Occupational History   Not on file  Tobacco Use   Smoking status: Never   Smokeless tobacco: Never  Vaping Use   Vaping Use: Never used  Substance and Sexual Activity   Alcohol use: Yes    Comment: occ with a special occ   Drug use: No   Sexual activity: Not on file  Other Topics Concern   Not on file  Social History Narrative   Not on file   Social Determinants of Health   Financial Resource Strain: Not on file  Food Insecurity: Not on file  Transportation Needs: Not on file  Physical Activity: Not on file  Stress: Not on file  Social Connections: Not on file  Intimate Partner Violence: Not on file    Review of Systems:  All other review of systems negative except as mentioned in the HPI.  Physical Exam: Vital signs in last 24 hours: Temp (!) 97.1 F (36.2 C)  General:   Alert, NAD Lungs:  Clear .   Heart:  Regular rate and rhythm Abdomen:  Soft, nontender and nondistended. Neuro/Psych:  Alert and cooperative. Normal mood and affect. A and O x 3  Reviewed labs, radiology imaging, old records and pertinent past GI work up  Patient is appropriate for planned procedure(s) and anesthesia in an ambulatory setting   K. Denzil Magnuson , MD [phone]

## 2021-10-16 NOTE — Op Note (Signed)
[facility] Patient Name: Andrea Montes Procedure Date: 10/16/2021 9:10 AM MRN: [id number] Endoscopist: Mauri Pole , MD Age: 66 Referring MD:  Date of Birth: [birth year] Gender: Female Account #: [id number] Procedure:                Colonoscopy Indications:              High risk colon cancer surveillance: Personal                            history of colonic polyps, High risk colon cancer                            surveillance: Personal history of adenoma less than                            10 mm in size Medicines:                Monitored Anesthesia Care Procedure:                Pre-Anesthesia Assessment:                           - Prior to the procedure, a History and Physical                            was performed, and patient medications and                            allergies were reviewed. The patient's tolerance of                            previous anesthesia was also reviewed. The risks                            and benefits of the procedure and the sedation                            options and risks were discussed with the patient.                            All questions were answered, and informed consent                            was obtained. Prior Anticoagulants: The patient has                            taken no previous anticoagulant or antiplatelet                            agents. ASA Grade Assessment: III - A patient with                            severe systemic disease. After reviewing the risks  and benefits, the patient was deemed in                            satisfactory condition to undergo the procedure.                           After obtaining informed consent, the colonoscope                            was passed under direct vision. Throughout the                            procedure, the patient's blood pressure, pulse, and                            oxygen saturations were monitored  continuously. The                            Olympus [id number]-[id number] (#[id number]) Colonoscope was                            introduced through the anus and advanced to the the                            cecum, identified by appendiceal orifice and                            ileocecal valve. The colonoscopy was performed                            without difficulty. The patient tolerated the                            procedure well. The quality of the bowel                            preparation was good. The ileocecal valve,                            appendiceal orifice, and rectum were photographed. Scope In: 9:35:09 AM Scope Out: 9:49:21 AM Scope Withdrawal Time: 0 hours 10 minutes 45 seconds  Total Procedure Duration: 0 hours 14 minutes 12 seconds  Findings:                 The perianal and digital rectal examinations were                            normal.                           A 5 mm polyp was found in the transverse colon. The                            polyp was sessile. The polyp was removed with a  cold snare. Resection and retrieval were complete.                           Non-bleeding external and internal hemorrhoids were                            found during retroflexion. The hemorrhoids were                            medium-sized.                           The exam was otherwise without abnormality. Complications:            No immediate complications. Estimated Blood Loss:     Estimated blood loss was minimal. Impression:               - One 5 mm polyp in the transverse colon, removed                            with a cold snare. Resected and retrieved.                           - Non-bleeding external and internal hemorrhoids.                           - The examination was otherwise normal. Recommendation:           - Patient has a contact number available for                            emergencies. The signs and symptoms of potential                             delayed complications were discussed with the                            patient. Return to normal activities tomorrow.                            Written discharge instructions were provided to the                            patient.                           - Resume previous diet.                           - Continue present medications.                           - Await pathology results.                           - Repeat colonoscopy in 7-10 years for surveillance  based on pathology results. Mauri Pole, MD 10/16/2021 9:55:36 AM This report has been signed electronically.

## 2021-10-16 NOTE — Progress Notes (Signed)
Sedate, gd SR, tolerated procedure well, VSS, report to RN 

## 2021-10-18 ENCOUNTER — Telehealth: Payer: Self-pay | Admitting: *Deleted

## 2021-10-18 ENCOUNTER — Telehealth: Payer: Self-pay

## 2021-10-18 NOTE — Telephone Encounter (Signed)
?  Follow up Call- ? ?Call back number 10/16/2021  ?Post procedure Call Back phone  # [phone]  ?Permission to leave phone message Yes  ?Some recent data might be hidden  ?  ? ?Patient questions: ? ?Do you have a fever, pain , or abdominal swelling? No. ?Pain Score  0 * ? ?Have you tolerated food without any problems? Yes.   ? ?Have you been able to return to your normal activities? Yes.   ? ?Do you have any questions about your discharge instructions: ?Diet   No. ?Medications  No. ?Follow up visit  No. ? ?Do you have questions or concerns about your Care? No. ? ?Actions: ?* If pain score is 4 or above: ?No action needed, pain <4. ? ? ?

## 2021-10-18 NOTE — Telephone Encounter (Signed)
Left message on f/u call 

## 2021-10-26 ENCOUNTER — Encounter: Payer: Self-pay | Admitting: Gastroenterology

## 2021-10-27 DIAGNOSIS — G4733 Obstructive sleep apnea (adult) (pediatric): Secondary | ICD-10-CM | POA: Diagnosis not present

## 2021-11-03 ENCOUNTER — Other Ambulatory Visit (HOSPITAL_COMMUNITY): Payer: Self-pay

## 2021-11-03 MED ORDER — FUROSEMIDE 20 MG PO TABS
20.0000 mg | ORAL_TABLET | ORAL | 3 refills | Status: DC
  Filled 2021-11-03: qty 90, 90d supply, fill #0
  Filled 2022-04-04: qty 90, 90d supply, fill #1
  Filled 2022-07-23: qty 90, 90d supply, fill #2

## 2021-11-03 MED ORDER — MELOXICAM 15 MG PO TABS
15.0000 mg | ORAL_TABLET | ORAL | 3 refills | Status: AC
  Filled 2021-11-03: qty 90, 90d supply, fill #0
  Filled 2022-03-14: qty 90, 90d supply, fill #1
  Filled 2022-07-31: qty 90, 90d supply, fill #2

## 2021-11-15 ENCOUNTER — Other Ambulatory Visit (HOSPITAL_COMMUNITY): Payer: Self-pay

## 2021-11-29 ENCOUNTER — Other Ambulatory Visit (HOSPITAL_COMMUNITY): Payer: Self-pay

## 2021-11-29 MED ORDER — FREESTYLE LIBRE 3 SENSOR MISC
11 refills | Status: AC
Start: 2021-11-29 — End: ?
  Filled 2021-11-29: qty 1, 14d supply, fill #0
  Filled 2022-02-09: qty 1, 14d supply, fill #1

## 2021-12-14 ENCOUNTER — Other Ambulatory Visit (HOSPITAL_COMMUNITY): Payer: Self-pay

## 2021-12-15 ENCOUNTER — Other Ambulatory Visit: Payer: Self-pay

## 2021-12-15 ENCOUNTER — Other Ambulatory Visit (HOSPITAL_COMMUNITY): Payer: Self-pay

## 2021-12-15 MED ORDER — OZEMPIC (0.25 OR 0.5 MG/DOSE) 2 MG/3ML ~~LOC~~ SOPN
0.2500 mg | PEN_INJECTOR | SUBCUTANEOUS | 9 refills | Status: DC
  Filled 2021-12-15: qty 9, 84d supply, fill #0
  Filled 2022-03-14: qty 3, 28d supply, fill #1

## 2021-12-16 DIAGNOSIS — H5203 Hypermetropia, bilateral: Secondary | ICD-10-CM | POA: Diagnosis not present

## 2021-12-18 ENCOUNTER — Other Ambulatory Visit (HOSPITAL_COMMUNITY): Payer: Self-pay

## 2021-12-18 MED ORDER — INSULIN GLARGINE-YFGN 100 UNIT/ML ~~LOC~~ SOPN
10.0000 [IU] | PEN_INJECTOR | Freq: Every day | SUBCUTANEOUS | 11 refills | Status: DC
  Filled 2021-12-18: qty 9, 90d supply, fill #0

## 2022-01-12 ENCOUNTER — Other Ambulatory Visit (HOSPITAL_COMMUNITY): Payer: Self-pay

## 2022-01-22 DIAGNOSIS — G4733 Obstructive sleep apnea (adult) (pediatric): Secondary | ICD-10-CM | POA: Diagnosis not present

## 2022-02-09 ENCOUNTER — Other Ambulatory Visit (HOSPITAL_COMMUNITY): Payer: Self-pay

## 2022-03-14 ENCOUNTER — Other Ambulatory Visit (HOSPITAL_COMMUNITY): Payer: Self-pay

## 2022-03-14 MED ORDER — METHOCARBAMOL 750 MG PO TABS
750.0000 mg | ORAL_TABLET | Freq: Three times a day (TID) | ORAL | 1 refills | Status: DC | PRN
  Filled 2022-03-14: qty 30, 10d supply, fill #0
  Filled 2023-02-01: qty 30, 10d supply, fill #1

## 2022-04-03 ENCOUNTER — Other Ambulatory Visit (HOSPITAL_COMMUNITY): Payer: Self-pay

## 2022-04-03 ENCOUNTER — Ambulatory Visit (INDEPENDENT_AMBULATORY_CARE_PROVIDER_SITE_OTHER): Payer: 59 | Admitting: Podiatry

## 2022-04-03 ENCOUNTER — Encounter: Payer: Self-pay | Admitting: Podiatry

## 2022-04-03 DIAGNOSIS — E1169 Type 2 diabetes mellitus with other specified complication: Secondary | ICD-10-CM

## 2022-04-03 DIAGNOSIS — B351 Tinea unguium: Secondary | ICD-10-CM | POA: Diagnosis not present

## 2022-04-03 DIAGNOSIS — G4733 Obstructive sleep apnea (adult) (pediatric): Secondary | ICD-10-CM | POA: Diagnosis not present

## 2022-04-03 DIAGNOSIS — B353 Tinea pedis: Secondary | ICD-10-CM | POA: Diagnosis not present

## 2022-04-03 DIAGNOSIS — I10 Essential (primary) hypertension: Secondary | ICD-10-CM | POA: Diagnosis not present

## 2022-04-03 DIAGNOSIS — Z0000 Encounter for general adult medical examination without abnormal findings: Secondary | ICD-10-CM | POA: Diagnosis not present

## 2022-04-03 DIAGNOSIS — E7800 Pure hypercholesterolemia, unspecified: Secondary | ICD-10-CM | POA: Diagnosis not present

## 2022-04-03 DIAGNOSIS — E1142 Type 2 diabetes mellitus with diabetic polyneuropathy: Secondary | ICD-10-CM

## 2022-04-03 MED ORDER — CLOTRIMAZOLE-BETAMETHASONE 1-0.05 % EX CREA
1.0000 | TOPICAL_CREAM | Freq: Every day | CUTANEOUS | 0 refills | Status: DC
  Filled 2022-04-03: qty 30, 30d supply, fill #0

## 2022-04-03 NOTE — Progress Notes (Signed)
  Subjective:  Patient ID: Andrea Montes, female    DOB: 03/22/1956,  MRN: [id number]  Chief Complaint  Patient presents with   Nail Problem    Rm 21 RFC Bilateral nail trim. Pt complains of discoloration.    Tinea Pedis    Right foot itching. Pt states there is a increase itching sensations in right foot between as well. There is some skin dryness and flaking.    66 y.o. female presents concern of thickened elongated and painful nails that are difficult to trim. Requesting to have them trimmed today. Relates itchiness and dryness on the right foot.  Relates burning and tingling in their feet. Patient is diabetic and last A1c was  Lab Results  Component Value Date   HGBA1C 6.4 (H) 10/16/2018   .   PCP:  Seward Carol, MD     Objective:  Physical Exam: warm, good capillary refill, nail exam onychomycosis of the toenails, trophic skin changes absent pedal hair, no ulcerative lesions. DP pulses palpable, PT pulses palpable, and protective sensation intact. Pes planus noted. Xerosis noted bilat No images are attached to the encounter.  Assessment:   1. Onychomycosis of multiple toenails with type 2 diabetes mellitus and peripheral neuropathy ([place])   2. Tinea pedis of right foot     Plan:  Patient was evaluated and treated and all questions answered.  -Discussed and educated patient on diabetic foot care, especially with  regards to the vascular, neurological and musculoskeletal systems.  -Stressed the importance of good glycemic control and the detriment of not  controlling glucose levels in relation to the foot. -Discussed supportive shoes at all times and checking feet regularly.  -Mechanically debrided all nails 1-5 bilateral using sterile nail nipper and filed with dremel without incident  -Answered all patient questions -Patient to return  in 3 months for at risk foot care -Patient advised to call the office if any problems or questions arise in the meantime.   Tinea  pedis -Lotrisone  provided.   Return in about 3 months (around 07/04/2022) for rfc.

## 2022-04-04 ENCOUNTER — Other Ambulatory Visit (HOSPITAL_COMMUNITY): Payer: Self-pay

## 2022-04-05 ENCOUNTER — Other Ambulatory Visit (HOSPITAL_COMMUNITY): Payer: Self-pay

## 2022-04-05 MED ORDER — OZEMPIC (2 MG/DOSE) 8 MG/3ML ~~LOC~~ SOPN
2.0000 mg | PEN_INJECTOR | SUBCUTANEOUS | 11 refills | Status: DC
  Filled 2022-04-05: qty 3, 28d supply, fill #0
  Filled 2022-04-06: qty 9, 84d supply, fill #0
  Filled 2022-07-06: qty 3, 28d supply, fill #1
  Filled 2022-07-31: qty 3, 28d supply, fill #2
  Filled 2022-09-14: qty 3, 28d supply, fill #3
  Filled 2022-10-16: qty 9, 84d supply, fill #4
  Filled 2023-02-01: qty 3, 28d supply, fill #5

## 2022-04-06 ENCOUNTER — Other Ambulatory Visit (HOSPITAL_COMMUNITY): Payer: Self-pay

## 2022-04-06 ENCOUNTER — Ambulatory Visit: Payer: 59 | Admitting: Surgical

## 2022-05-11 ENCOUNTER — Other Ambulatory Visit (HOSPITAL_COMMUNITY): Payer: Self-pay

## 2022-05-12 ENCOUNTER — Other Ambulatory Visit: Payer: Self-pay

## 2022-05-12 ENCOUNTER — Emergency Department (HOSPITAL_BASED_OUTPATIENT_CLINIC_OR_DEPARTMENT_OTHER)
Admission: EM | Admit: 2022-05-12 | Discharge: 2022-05-12 | Disposition: A | Discharge status: Home / Self Care | Payer: 59 | Attending: Emergency Medicine | Admitting: Emergency Medicine

## 2022-05-12 ENCOUNTER — Emergency Department (HOSPITAL_BASED_OUTPATIENT_CLINIC_OR_DEPARTMENT_OTHER): Payer: 59 | Admitting: Radiology

## 2022-05-12 ENCOUNTER — Encounter (HOSPITAL_BASED_OUTPATIENT_CLINIC_OR_DEPARTMENT_OTHER): Payer: Self-pay

## 2022-05-12 DIAGNOSIS — M25559 Pain in unspecified hip: Secondary | ICD-10-CM

## 2022-05-12 DIAGNOSIS — Z79899 Other long term (current) drug therapy: Secondary | ICD-10-CM | POA: Insufficient documentation

## 2022-05-12 DIAGNOSIS — M25551 Pain in right hip: Secondary | ICD-10-CM | POA: Insufficient documentation

## 2022-05-12 DIAGNOSIS — R070 Pain in throat: Secondary | ICD-10-CM

## 2022-05-12 DIAGNOSIS — Z7984 Long term (current) use of oral hypoglycemic drugs: Secondary | ICD-10-CM | POA: Insufficient documentation

## 2022-05-12 DIAGNOSIS — U071 COVID-19: Secondary | ICD-10-CM | POA: Insufficient documentation

## 2022-05-12 DIAGNOSIS — M160 Bilateral primary osteoarthritis of hip: Secondary | ICD-10-CM | POA: Diagnosis not present

## 2022-05-12 LAB — RESP PANEL BY RT-PCR (FLU A&B, COVID) ARPGX2
Influenza A by PCR: NEGATIVE
Influenza B by PCR: NEGATIVE
SARS Coronavirus 2 by RT PCR: POSITIVE — AB

## 2022-05-12 LAB — GROUP A STREP BY PCR: Group A Strep by PCR: NOT DETECTED

## 2022-05-12 MED ORDER — NIRMATRELVIR/RITONAVIR (PAXLOVID)TABLET: 3.0000 | ORAL_TABLET | Freq: Two times a day (BID) | ORAL | 0 refills | Status: AC

## 2022-05-12 MED ORDER — DEXAMETHASONE 4 MG PO TABS
10.0000 mg | ORAL_TABLET | Freq: Once | ORAL | Status: AC
  Administered 2022-05-12: 10 mg via ORAL
  Filled 2022-05-12: qty 3

## 2022-05-12 NOTE — ED Provider Notes (Signed)
[facility] Provider Note   CSN: [id number] Arrival date & time: 05/12/22  0744     History  Chief Complaint  Patient presents with   Sore Throat    Andrea Montes is a 66 y.o. female.  Is here with sore throat that occurred overnight.  Woke up with pain in the throat, pain with swallowing.  Sick contacts possibly.  Also been having some right hip pain but denies any traumas of fall.  History of arthritis.  Denies any chest pain or shortness of breath or fever or chills.  Nothing makes it worse or better.  Mostly here for throat pain.  Hip pains involving her for some time but has not had any images.  Denies any nausea or vomiting or weakness or numbness or loss of bowel or bladder or back pain.  The history is provided by the patient.       Home Medications Prior to Admission medications   Medication Sig Start Date End Date Taking? Authorizing Provider  acyclovir (ZOVIRAX) 400 MG tablet Take 1 tablet (400 mg total) by mouth 4 (four) times daily. Patient not taking: Reported on 10/09/2021 08/20/21   Domenic Moras, PA-C  Aspirin-Acetaminophen-Caffeine (GOODY HEADACHE PO) Take 1 Package by mouth as needed (pain).    [provider]  atorvastatin (LIPITOR) 10 MG tablet TAKE 1 TABLET (10 MG TOTAL) BY MOUTH AT BEDTIME. 10/27/20 01/01/22  Josue Hector, MD  Blood Glucose Monitoring Suppl (FREESTYLE LITE) w/Device KIT Use to test your blood sugar as directed 10/02/21     Blood Pressure Monitoring (OMRON 3 SERIES BP MONITOR) DEVI  10/05/19   [provider]  calcium carbonate (TUMS EX) 750 MG chewable tablet Chew 1-2 tablets by mouth 2 (two) times daily as needed for heartburn.    [provider]  Cholecalciferol (VITAMIN D3) 50 MCG (2000 UT) TABS Take 2,000 Units by mouth every Friday.    [provider]  clotrimazole-betamethasone (LOTRISONE) cream Apply 1 Application topically daily. 04/03/22   Lorenda Peck, DPM  Continuous  Blood Gluc Sensor (FREESTYLE LIBRE 3 SENSOR) MISC Apply to upper back or arm change every 14 (fourteen) days. 11/29/21   Seward Carol, MD  COVID-19 At Home Antigen Test [facility] COVID-19 HOME TEST) KIT Use as directed 07/12/21   Jefm Bryant, [place]  empagliflozin (JARDIANCE) 25 MG TABS tablet Take 1 tablet (25 mg total) by mouth daily. 06/23/20   Seward Carol, MD  empagliflozin (JARDIANCE) 25 MG TABS tablet Take 1 tablet (25 mg total) by mouth daily. 03/06/21     FREESTYLE LITE test strip  12/17/19   [provider]  furosemide (LASIX) 20 MG tablet Take 1 tablet (20 mg total) by mouth daily. 03/06/21     furosemide (LASIX) 20 MG tablet Take 1 tablet by mouth once daily 11/03/21     Insulin Pen Needle 31G X 5 MM MISC Use once daily as directed 10/04/21     Lancets (FREESTYLE) lancets 1 each 3 (three) times daily. 12/17/19   [provider]  losartan (COZAAR) 50 MG tablet Take 1 tablet (50 mg total) by mouth daily. 03/06/21     meloxicam (MOBIC) 15 MG tablet Take 1 tablet (15 mg total) by mouth daily as needed for pain. 05/02/17   Thurman Coyer, DO  meloxicam (MOBIC) 15 MG tablet Take 1 tablet by mouth once daily 11/03/21     metFORMIN (GLUCOPHAGE-XR) 750 MG 24 hr tablet Take 2 tablets (1,500 mg total)  by mouth daily with food Patient taking differently: Take 750 mg by mouth in the morning and at bedtime. 03/06/21     methocarbamol (ROBAXIN) 750 MG tablet Take 1 tablet (750 mg total) by mouth every 8 (eight) hours as needed. 03/14/22     Multiple Vitamin (MULTIVITAMIN WITH MINERALS) TABS tablet Take 1 tablet by mouth every other day. In the morning    [provider]  Semaglutide, 2 MG/DOSE, (OZEMPIC, 2 MG/DOSE,) 8 MG/3ML SOPN Inject 2 mg into the skin once a week. 04/05/22     Semaglutide,0.25 or 0.5MG/DOS, (OZEMPIC, 0.25 OR 0.5 MG/DOSE,) 2 MG/3ML SOPN Inject 0.25 mg into the skin once a week for 4 weeks then increase to 0.5 mg once a week thereafter 10/02/21   Seward Carol,  MD  SUMAtriptan (IMITREX) 25 MG tablet TAKE 1 TABLET BY MOUTH AT ONSET OF MIGRAINE, MAY REPEAT IN 1 HR IF NEEDED 03/29/20 10/09/21  Seward Carol, MD  traMADol (ULTRAM) 50 MG tablet Take 1 tablet (50 mg total) by mouth every 6 (six) hours as needed. 02/16/21   Magnant, Gerrianne Scale, PA-C      Allergies    Lisinopril, Kiwi extract, and Pineapple flavor    Review of Systems   Review of Systems  Physical Exam Updated Vital Signs BP (!) 156/92 (BP Location: Right Arm)   Pulse 97   Resp 18   Ht 5' 3"  (1.6 m)   Wt 112.9 kg   SpO2 100%   BMI 44.11 kg/m  Physical Exam Vitals and nursing note reviewed.  Constitutional:      General: She is not in acute distress.    Appearance: She is well-developed.  HENT:     Head: Normocephalic and atraumatic.     Left Ear: Tympanic membrane normal.     Nose: Congestion present.     Mouth/Throat:     Pharynx: Uvula midline. Posterior oropharyngeal erythema present. No pharyngeal swelling or oropharyngeal exudate.     Tonsils: No tonsillar exudate or tonsillar abscesses. 0 on the right. 0 on the left.  Eyes:     Conjunctiva/sclera: Conjunctivae normal.  Cardiovascular:     Rate and Rhythm: Normal rate and regular rhythm.     Heart sounds: Normal heart sounds. No murmur heard. Pulmonary:     Effort: Pulmonary effort is normal. No respiratory distress.     Breath sounds: Normal breath sounds.  Abdominal:     Palpations: Abdomen is soft.     Tenderness: There is no abdominal tenderness.  Musculoskeletal:        General: No swelling.     Cervical back: Neck supple.     Comments: Tenderness range of motion of the right hip  Skin:    General: Skin is warm and dry.     Capillary Refill: Capillary refill takes less than 2 seconds.  Neurological:     General: No focal deficit present.     Mental Status: She is alert.     Comments: 5+ out of 5 strength in lower extremities, normal sensation  Psychiatric:        Mood and Affect: Mood normal.      ED Results / Procedures / Treatments   Labs (all labs ordered are listed, but only abnormal results are displayed) Labs Reviewed  GROUP A STREP BY PCR  RESP PANEL BY RT-PCR (FLU A&B, COVID) [id number]    EKG None  Radiology DG Hip Unilat With Pelvis 2-3 Views Right  Result Date: 05/12/2022 CLINICAL DATA:  66 year old female with history of right buttock pain since yesterday. EXAM: DG HIP (WITH OR WITHOUT PELVIS) 2-3V RIGHT COMPARISON:  No priors. FINDINGS: There is no evidence of hip fracture or dislocation. Joint space narrowing, subchondral sclerosis and osteophyte formation is noted in the hip joints bilaterally. Degenerative changes are also noted at the symphysis pubis. IMPRESSION: 1. No acute radiographic abnormality of the bony pelvis. 2. Bilateral hip joint osteoarthritis. Electronically Signed   By: Vinnie Langton M.D.   On: 05/12/2022 08:56    Procedures Procedures    Medications Ordered in ED Medications  dexamethasone (DECADRON) tablet 10 mg (10 mg Oral Given 05/12/22 3552)    ED Course/ Medical Decision Making/ A&P                           Medical Decision Making Amount and/or Complexity of Data Reviewed Radiology: ordered.  Risk Prescription drug management.   [name] is here with sore throat, right hip pain.  No significant medical history.  Normal vitals.  No fever.  Differential for sore throat is viral process versus allergy process versus strep.  Will swab for COVID, flu, strep.  We will give a dose of Decadron.  Will give antibiotics if strep test is positive.  As far as right hip pain.  This appears more chronic in nature.  No specific trauma.  Differential diagnosis likely arthritis versus muscular process.  I have no concern for cauda equina, other acute process.  X-ray of the right hip was obtained that showed no fracture or dislocation.  Recommend Tylenol and ibuprofen and rest.  We will have her follow-up with primary care doctor.   Discharged in good condition.  This chart was dictated using voice recognition software.  Despite best efforts to proofread,  errors can occur which can change the documentation meaning.         Final Clinical Impression(s) / ED Diagnoses Final diagnoses:  Throat pain  Hip pain, unspecified laterality    Rx / DC Orders ED Discharge Orders     None         Lennice Sites, DO 05/12/22 0900

## 2022-05-12 NOTE — ED Notes (Signed)
Pt returned from radiology.

## 2022-05-12 NOTE — Discharge Instructions (Signed)
X-ray shows no fracture or malalignment.  Overall suspect arthritis type pain there.  Recommend Tylenol and ibuprofen for pain.  Your strep test is negative.  You will not need antibiotics.  Your COVID and flu test are pending.  Follow-up with your MyChart.  I have treated you with a long-acting steroid that should help.  Overall suspect you have a viral process or allergy type process.

## 2022-05-12 NOTE — ED Triage Notes (Signed)
Pt c/o sore throat since waking up this morning at 4am, states pain woke her up. Pt c/o difficulty swallowing. Pt also c/o right hip pain and denies falls and any trauma

## 2022-05-12 NOTE — ED Notes (Addendum)
Pt being txp to radiology

## 2022-05-14 ENCOUNTER — Other Ambulatory Visit (HOSPITAL_COMMUNITY): Payer: Self-pay

## 2022-05-14 MED ORDER — LOSARTAN POTASSIUM 50 MG PO TABS
50.0000 mg | ORAL_TABLET | Freq: Every day | ORAL | 3 refills | Status: DC
  Filled 2022-05-14 – 2022-05-28 (×2): qty 90, 90d supply, fill #0
  Filled 2022-09-14: qty 90, 90d supply, fill #1
  Filled 2023-02-01: qty 90, 90d supply, fill #2

## 2022-05-14 MED ORDER — METFORMIN HCL ER 750 MG PO TB24
1500.0000 mg | ORAL_TABLET | Freq: Every day | ORAL | 3 refills | Status: DC
  Filled 2022-05-14 – 2022-07-31 (×2): qty 180, 90d supply, fill #0
  Filled 2023-02-01: qty 180, 90d supply, fill #1

## 2022-05-14 MED ORDER — JARDIANCE 25 MG PO TABS
25.0000 mg | ORAL_TABLET | Freq: Every day | ORAL | 3 refills | Status: DC
  Filled 2022-05-14 – 2022-05-28 (×2): qty 90, 90d supply, fill #0
  Filled 2022-09-14: qty 90, 90d supply, fill #1
  Filled 2023-02-01: qty 90, 90d supply, fill #2
  Filled 2023-04-23: qty 30, 30d supply, fill #2

## 2022-05-19 DIAGNOSIS — H43811 Vitreous degeneration, right eye: Secondary | ICD-10-CM | POA: Diagnosis not present

## 2022-05-22 ENCOUNTER — Other Ambulatory Visit (HOSPITAL_COMMUNITY): Payer: Self-pay

## 2022-05-28 ENCOUNTER — Other Ambulatory Visit (HOSPITAL_COMMUNITY): Payer: Self-pay

## 2022-07-01 DIAGNOSIS — H43811 Vitreous degeneration, right eye: Secondary | ICD-10-CM | POA: Diagnosis not present

## 2022-07-06 ENCOUNTER — Other Ambulatory Visit (HOSPITAL_COMMUNITY): Payer: Self-pay

## 2022-07-20 ENCOUNTER — Other Ambulatory Visit (HOSPITAL_COMMUNITY): Payer: Self-pay

## 2022-07-20 ENCOUNTER — Ambulatory Visit (INDEPENDENT_AMBULATORY_CARE_PROVIDER_SITE_OTHER): Payer: 59 | Admitting: Podiatry

## 2022-07-20 ENCOUNTER — Encounter: Payer: Self-pay | Admitting: Podiatry

## 2022-07-20 DIAGNOSIS — R21 Rash and other nonspecific skin eruption: Secondary | ICD-10-CM | POA: Diagnosis not present

## 2022-07-20 DIAGNOSIS — L299 Pruritus, unspecified: Secondary | ICD-10-CM | POA: Diagnosis not present

## 2022-07-20 DIAGNOSIS — M2141 Flat foot [pes planus] (acquired), right foot: Secondary | ICD-10-CM | POA: Diagnosis not present

## 2022-07-20 DIAGNOSIS — E1142 Type 2 diabetes mellitus with diabetic polyneuropathy: Secondary | ICD-10-CM | POA: Diagnosis not present

## 2022-07-20 DIAGNOSIS — E1169 Type 2 diabetes mellitus with other specified complication: Secondary | ICD-10-CM

## 2022-07-20 DIAGNOSIS — B351 Tinea unguium: Secondary | ICD-10-CM | POA: Diagnosis not present

## 2022-07-20 DIAGNOSIS — E119 Type 2 diabetes mellitus without complications: Secondary | ICD-10-CM | POA: Diagnosis not present

## 2022-07-20 DIAGNOSIS — M2142 Flat foot [pes planus] (acquired), left foot: Secondary | ICD-10-CM

## 2022-07-20 DIAGNOSIS — E1165 Type 2 diabetes mellitus with hyperglycemia: Secondary | ICD-10-CM | POA: Insufficient documentation

## 2022-07-20 DIAGNOSIS — L680 Hirsutism: Secondary | ICD-10-CM | POA: Insufficient documentation

## 2022-07-20 MED ORDER — TRIAMCINOLONE ACETONIDE 0.1 % EX OINT
TOPICAL_OINTMENT | Freq: Two times a day (BID) | CUTANEOUS | 0 refills | Status: DC
  Filled 2022-07-20: qty 454, 30d supply, fill #0

## 2022-07-20 NOTE — Progress Notes (Unsigned)
ANNUAL DIABETIC FOOT EXAM  Subjective: Andrea Montes presents today {jgcomplaint:23593}.  Chief Complaint  Patient presents with   Nail Problem    Diabetic foot care BS-97 A1C-6.1 PCP-Polite PCP VST-04/03/2022    Patient confirms h/o diabetes.  Patient relates {Numbers; 0-100:15068} year h/o diabetes.  Patient denies any h/o foot wounds.  Patient has h/o foot ulcer of {jgPodToeLocator:23637}, which healed via help of ***.  Patient admits symptoms of foot numbness.   Patient admits symptoms of foot tingling.  Patient admits symptoms of burning in feet.  Patient admits symptoms of pins/needles sensation in feet.  Patient denies any numbness, tingling, burning, or pins/needle sensation in feet.  Patient has been diagnosed with neuropathy and it is managed with {JGNEUROPATHYMEDS:27053}.  Patient's blood sugar was *** mg/dl {Time; today/yesterday/ 2 days ago:19188}. Last known  HgA1c was ***%   Patient did not check blood glucose this morning.  Patient does not monitor blood glucose daily.  Risk factors: {jgriskfactors:24044}.  Seward Carol, MD is patient's PCP. Last visit was {Time; dates multiple:15870}***.  Past Medical History:  Diagnosis Date   Anemia    no current med.   Apnea    Articular cartilage disorder of left shoulder region 10/2014   Back pain    Carpal tunnel syndrome    Complication of anesthesia    itching from last surgery that was completed at the surgery center off of [address] patient was given benedryl, generalized itching and they are not sure where that came from   DDD (degenerative disc disease), lumbar    Dental crowns present    Diabetes mellitus without complication ([place])    Edema of lower extremity    bilateral   Frozen shoulder    surgery 11-04-14   GERD (gastroesophageal reflux disease)    no current med.   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[facility])    Seasonal allergies    Sleep apnea    uses CPAP nightly   Trigger finger, left middle finger    Vitamin D deficiency    Wears partial dentures    upper   Patient Active Problem List   Diagnosis Date Noted   Hirsutism 07/20/2022   Hyperglycemia due to type 2 diabetes mellitus ([place]) 07/20/2022   Type 2 diabetes mellitus without complication, without long-term current use of insulin ([place]) 10/01/2018   Trigger finger, right ring finger 09/09/2015   Low back pain radiating to both legs 05/06/2012   Leg length discrepancy 05/06/2012   HYPERTENSION, BENIGN 04/11/2009   LEG EDEMA, BILATERAL 10/08/2008   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 10/17/2006   Carpal tunnel syndrome, right upper limb 10/17/2006   Past Surgical History:  Procedure Laterality Date   CARPAL TUNNEL RELEASE Left 04/25/2006   CARPAL TUNNEL RELEASE Right 07/15/2018   Procedure: RIGHT CARPAL TUNNEL RELEASE;  Surgeon: Meredith Pel, MD;  Location: [facility];  Service: Orthopedics;  Laterality: Right;   CLOSED MANIPULATION SHOULDER WITH STERIOD INJECTION Left  02/03/2015   Procedure: LEFT  MANIPULATION SHOULDER UNDER ANESTHESIA WITH INJECTION OF STEROID;  Surgeon: Kathryne Hitch, MD;  Location: [facility];  Service: Orthopedics;  Laterality: Left;   COLONOSCOPY     COLONOSCOPY WITH PROPOFOL  10/09/2011   POLYPECTOMY     RESECTION DISTAL CLAVICAL Left 11/04/2014   Procedure: RESECTION DISTAL CLAVICAL;  Surgeon: Kathryne Hitch, MD;  Location: [facility];  Service: Orthopedics;  Laterality: Left;   ROTATOR CUFF REPAIR  2010    SHOULDER ARTHROSCOPY WITH ROTATOR CUFF REPAIR Right 10/04/2008   SHOULDER ARTHROSCOPY WITH SUBACROMIAL DECOMPRESSION, ROTATOR CUFF REPAIR AND BICEP TENDON REPAIR Left 11/04/2014   Procedure: LEFT SHOULDER ARTHROSCOPY WITH DEBRIDEMENT, DISTAL CLAVICLE EXCISION, ACROMIOPLASTY, ROTATOR CUFF REPAIR AND BICEP TENODESIS;  Surgeon: Kathryne Hitch, MD;  Location: [facility];  Service: Orthopedics;  Laterality: Left;   TRIGGER FINGER RELEASE Right 02/03/2015   Procedure: RIGHT LONG FINGER TRIGGER RELEASE;  Surgeon: Kathryne Hitch, MD;  Location: [facility];  Service: Orthopedics;  Laterality: Right;   TRIGGER FINGER RELEASE Left 10/10/2017   Procedure: LEFT HAND RELEASE TRIGGER FINGER 3RD LONG FINGER AND CYST EXCISION;  Surgeon: Meredith Pel, MD;  Location: [facility];  Service: Orthopedics;  Laterality: Left;   TRIGGER FINGER RELEASE Right 02/16/2021   Procedure: TRIGGER FINGER RELEASE RIGHT RING FINGER;  Surgeon: Meredith Pel, MD;  Location: [facility];  Service: Orthopedics;  Laterality: Right;   TUBAL LIGATION     VAGINAL HYSTERECTOMY  2001   partial   Current Outpatient Medications on File Prior to Visit  Medication Sig Dispense Refill   acyclovir (ZOVIRAX) 400 MG tablet Take 1 tablet (400 mg total) by mouth 4 (four) times daily. (Patient not taking: Reported on 10/09/2021) 50 tablet 0   Aspirin-Acetaminophen-Caffeine (GOODY HEADACHE PO) Take 1 Package by mouth as needed (pain).     atorvastatin (LIPITOR) 10 MG tablet TAKE 1 TABLET (10 MG TOTAL) BY MOUTH AT BEDTIME. 90 tablet 3   Blood Glucose Monitoring Suppl (FREESTYLE LITE) w/Device KIT Use to test your blood sugar as directed 1 kit 0   Blood Pressure Monitoring (OMRON 3 SERIES BP MONITOR) DEVI      calcium carbonate (TUMS EX) 750 MG chewable tablet Chew 1-2 tablets by mouth 2 (two) times daily as needed for heartburn.     Cholecalciferol (VITAMIN D3) 50 MCG (2000 UT) TABS Take 2,000 Units by mouth every Friday.      clotrimazole-betamethasone (LOTRISONE) cream Apply 1 Application topically daily. 30 g 0   Continuous Blood Gluc Sensor (FREESTYLE LIBRE 3 SENSOR) MISC Apply to upper back or arm change every 14 (fourteen) days. 2 each 11   COVID-19 At Home Antigen Test ([facility] COVID-19 HOME TEST) KIT Use as directed 4 each 0   empagliflozin (JARDIANCE) 25 MG TABS tablet Take 1 tablet (25 mg total) by mouth daily. 90 tablet 3   empagliflozin (JARDIANCE) 25 MG TABS tablet Take 1 tablet (25 mg total) by mouth daily. 90 tablet 3   FREESTYLE LITE test strip      furosemide (LASIX) 20 MG tablet Take 1 tablet (20 mg total) by mouth daily. 90 tablet 3   furosemide (LASIX) 20 MG tablet Take 1 tablet by mouth once daily 90 tablet 3   Insulin Pen Needle 31G X 5 MM MISC Use once daily as directed 100 each 3   Lancets (FREESTYLE) lancets 1 each 3 (three) times daily.     losartan (COZAAR) 50 MG  tablet Take 1 tablet (50 mg total) by mouth daily. 90 tablet 3   meloxicam (MOBIC) 15 MG tablet Take 1 tablet (15 mg total) by mouth daily as needed for pain. 60 tablet 1   meloxicam (MOBIC) 15 MG tablet Take 1 tablet by mouth once daily 90 tablet 3   metFORMIN (GLUCOPHAGE-XR) 750 MG 24 hr tablet Take 2 tablets (1,500 mg total) by mouth daily with food 180 tablet 3   methocarbamol (ROBAXIN) 750 MG tablet Take 1 tablet (750 mg total) by mouth every 8 (eight) hours as needed. 30 tablet 1   Multiple Vitamin (MULTIVITAMIN WITH MINERALS) TABS tablet Take 1 tablet by mouth every other day. In the morning     Semaglutide, 2 MG/DOSE, (OZEMPIC, 2 MG/DOSE,) 8 MG/3ML SOPN Inject 2 mg into the skin once a week. 3 mL 11   Semaglutide,0.25 or 0.5MG/DOS, (OZEMPIC, 0.25 OR 0.5 MG/DOSE,) 2 MG/3ML SOPN Inject 0.25 mg into the skin once a week for 4 weeks then increase to 0.5 mg once a week thereafter 3 mL 9   SUMAtriptan (IMITREX) 25 MG tablet TAKE 1 TABLET BY MOUTH AT ONSET OF MIGRAINE, MAY REPEAT IN 1 HR IF NEEDED 15 tablet 5   traMADol (ULTRAM) 50  MG tablet Take 1 tablet (50 mg total) by mouth every 6 (six) hours as needed. 30 tablet 0   Current Facility-Administered Medications on File Prior to Visit  Medication Dose Route Frequency Provider Last Rate Last Admin   0.9 %  sodium chloride infusion  500 mL Intravenous Continuous Nandigam, Kavitha V, MD        Allergies  Allergen Reactions   Lisinopril Shortness Of Breath and Other (See Comments)    wheezing   Kiwi Extract Itching and Other (See Comments)    Itching in mouth with KIWI, And pineapple juice- topically irritating    Pineapple Flavor Itching    Other reaction(s): Unknown   Social History   Occupational History   Not on file  Tobacco Use   Smoking status: Never   Smokeless tobacco: Never  Vaping Use   Vaping Use: Never used  Substance and Sexual Activity   Alcohol use: Yes    Comment: occ with a special occ   Drug use: No   Sexual activity: Not on file   Family History  Problem Relation Age of Onset   Kidney disease Brother    Hypertension Brother    Leukemia Mother    Heart disease Mother    Stroke Mother    Cancer Mother    Depression Mother    Anxiety disorder Mother    AAA (abdominal aortic aneurysm) Father    Esophageal cancer Maternal Uncle    Hypertension Brother    Colon cancer Neg Hx    Colon polyps Neg Hx    Rectal cancer Neg Hx    Stomach cancer Neg Hx    Immunization History  Administered Date(s) Administered   Influenza Whole 05/06/2009   Td 08/20/2001, 08/25/2007     Review of Systems: Negative except as noted in the HPI.   Objective: There were no vitals filed for this visit.  Andrea Montes is a pleasant 66 y.o. female in NAD. AAO X 3.  Vascular Examination: {jgvascular:23595}  Dermatological Examination: {jgderm:23598}  Neurological Examination: {jgneuro:23601::"Protective sensation intact 5/5 intact bilaterally with 10g monofilament b/l.","Vibratory sensation intact b/l.","Proprioception intact  bilaterally."}  Musculoskeletal Examination: {jgmsk:23600}  Footwear Assessment: Does the patient wear appropriate shoes? {Yes,No}. Does the patient need inserts/orthotics? {  Yes,No}.  ADA Risk Categorization: Low Risk :  Patient has all of the following: Intact protective sensation No prior foot ulcer  No severe deformity Pedal pulses present  High Risk  Patient has one or more of the following: Loss of protective sensation Absent pedal pulses Severe Foot deformity History of foot ulcer  Assessment: 1. Onychomycosis of multiple toenails with type 2 diabetes mellitus and peripheral neuropathy ([place])   2. Skin eruption   3. Itching      Plan: {jgplan:23602::"-Patient/POA to call should there be question/concern in the interim."} Return in about 3 months (around 10/19/2022).  Marzetta Board, DPM

## 2022-07-23 ENCOUNTER — Other Ambulatory Visit (HOSPITAL_COMMUNITY): Payer: Self-pay

## 2022-07-31 ENCOUNTER — Other Ambulatory Visit (HOSPITAL_COMMUNITY): Payer: Self-pay

## 2022-08-24 DIAGNOSIS — Z780 Asymptomatic menopausal state: Secondary | ICD-10-CM | POA: Diagnosis not present

## 2022-08-24 DIAGNOSIS — Z1231 Encounter for screening mammogram for malignant neoplasm of breast: Secondary | ICD-10-CM | POA: Diagnosis not present

## 2022-09-06 DIAGNOSIS — E119 Type 2 diabetes mellitus without complications: Secondary | ICD-10-CM | POA: Diagnosis not present

## 2022-09-06 DIAGNOSIS — I10 Essential (primary) hypertension: Secondary | ICD-10-CM | POA: Diagnosis not present

## 2022-09-06 DIAGNOSIS — Z90710 Acquired absence of both cervix and uterus: Secondary | ICD-10-CM | POA: Diagnosis not present

## 2022-09-06 DIAGNOSIS — Z01419 Encounter for gynecological examination (general) (routine) without abnormal findings: Secondary | ICD-10-CM | POA: Diagnosis not present

## 2022-09-06 DIAGNOSIS — L680 Hirsutism: Secondary | ICD-10-CM | POA: Diagnosis not present

## 2022-09-14 ENCOUNTER — Other Ambulatory Visit (HOSPITAL_COMMUNITY): Payer: Self-pay

## 2022-09-21 ENCOUNTER — Other Ambulatory Visit: Payer: Self-pay | Admitting: Cardiology

## 2022-09-21 DIAGNOSIS — Z131 Encounter for screening for diabetes mellitus: Secondary | ICD-10-CM

## 2022-09-21 DIAGNOSIS — Z13220 Encounter for screening for lipoid disorders: Secondary | ICD-10-CM

## 2022-09-21 LAB — GLUCOSE, POCT (MANUAL RESULT ENTRY): POC Glucose: 101 mg/dl — AB (ref 70–99)

## 2022-09-21 NOTE — Progress Notes (Signed)
Client reported she takes Losartan 50 mg daily, she took meds this am @ 8:00 am.

## 2022-09-22 LAB — LIPID PANEL W/O CHOL/HDL RATIO
Cholesterol, Total: 117 mg/dL (ref 100–199)
HDL: 39 mg/dL — ABNORMAL LOW (ref >39)
LDL Chol Calc (NIH): 56 mg/dL (ref 0–99)
Triglycerides: 120 mg/dL (ref 0–149)
VLDL Cholesterol Cal: 22 mg/dL (ref 5–40)

## 2022-09-22 LAB — HGB A1C W/O EAG: Hgb A1c MFr Bld: 5.9 % — ABNORMAL HIGH (ref 4.8–5.6)

## 2022-10-01 ENCOUNTER — Encounter: Payer: Self-pay | Admitting: *Deleted

## 2022-10-01 ENCOUNTER — Telehealth: Payer: Self-pay

## 2022-10-01 NOTE — Telephone Encounter (Signed)
Called patient via to give lab results from free screening.  Total Cholesterol: 117 Triglycerides: 120 HDL Cholesterol: 39 LDL Cholesterol: 56 Hemoglobin A1C: 5.9   Spoke with patient to give lab results to patient from [facility]. Encouraged patient to increase healthy fat intake (nuts, avocados, olive oil and heart healthy fish). HbA1C should be re-checked in 3-6 months with PCP. Patient voiced understanding.

## 2022-10-02 NOTE — Progress Notes (Signed)
Pt has established PCP, Dr. Delfina Redwood, whom she has seen within the past year. Pt had lipid and A1C screened at [facility]/21/22 [facility] event and Jonna Clark, [place] contacted pt to discuss these results and potential lifestye/diet interventions. [facility] notes that pt also saw comments about labs by Dr. Harriet Masson. No SDOH barriers noted at 09/21/22 event. No additional health equity team support indicated at this time.

## 2022-10-08 DIAGNOSIS — Z03818 Encounter for observation for suspected exposure to other biological agents ruled out: Secondary | ICD-10-CM | POA: Diagnosis not present

## 2022-10-08 DIAGNOSIS — R5383 Other fatigue: Secondary | ICD-10-CM | POA: Diagnosis not present

## 2022-10-08 DIAGNOSIS — J029 Acute pharyngitis, unspecified: Secondary | ICD-10-CM | POA: Diagnosis not present

## 2022-10-08 DIAGNOSIS — R051 Acute cough: Secondary | ICD-10-CM | POA: Diagnosis not present

## 2022-10-10 ENCOUNTER — Encounter: Payer: Self-pay | Admitting: Orthopedic Surgery

## 2022-10-10 ENCOUNTER — Ambulatory Visit (INDEPENDENT_AMBULATORY_CARE_PROVIDER_SITE_OTHER): Payer: 59

## 2022-10-10 ENCOUNTER — Ambulatory Visit (INDEPENDENT_AMBULATORY_CARE_PROVIDER_SITE_OTHER): Payer: 59 | Admitting: Orthopedic Surgery

## 2022-10-10 DIAGNOSIS — M25561 Pain in right knee: Secondary | ICD-10-CM

## 2022-10-10 DIAGNOSIS — E1169 Type 2 diabetes mellitus with other specified complication: Secondary | ICD-10-CM | POA: Diagnosis not present

## 2022-10-10 DIAGNOSIS — G4733 Obstructive sleep apnea (adult) (pediatric): Secondary | ICD-10-CM | POA: Diagnosis not present

## 2022-10-10 DIAGNOSIS — M25562 Pain in left knee: Secondary | ICD-10-CM

## 2022-10-10 DIAGNOSIS — E119 Type 2 diabetes mellitus without complications: Secondary | ICD-10-CM | POA: Diagnosis not present

## 2022-10-10 DIAGNOSIS — I10 Essential (primary) hypertension: Secondary | ICD-10-CM | POA: Diagnosis not present

## 2022-10-10 DIAGNOSIS — E7800 Pure hypercholesterolemia, unspecified: Secondary | ICD-10-CM | POA: Diagnosis not present

## 2022-10-10 DIAGNOSIS — H43811 Vitreous degeneration, right eye: Secondary | ICD-10-CM | POA: Diagnosis not present

## 2022-10-10 NOTE — Progress Notes (Signed)
Office Visit Note   Patient: Andrea Montes           Date of Birth: 1956/06/13           MRN: [id number] Visit Date: 10/10/2022 Requested by: Seward Carol, MD [address] [place] [address] [place],  [place] [address] PCP: Seward Carol, MD  Subjective: Chief Complaint  Patient presents with   Right Knee - Pain   Left Knee - Pain    HPI: Andrea Montes is a 67 y.o. female who presents to the office reporting bilateral knee pain right equal to left.  She has good and bad days.  She states "my knees are giving me if it".  She is trying to lose weight.  Goal is 100 pounds weight loss.  She is taking Ozempic and metformin.  Last hemoglobin A1c 5.9.  Describes increased pain after sitting and going to standing.  She is on Lasix.  She is getting ready to retire March 1 after 30 years.  She is a patient Glass blower/designer.  She has about 1/4 mile of walking endurance.  The pain does not wake her from sleep..                ROS: All systems reviewed are negative as they relate to the chief complaint within the history of present illness.  Patient denies fevers or chills.  Assessment & Plan: Visit Diagnoses:  1. Pain in both knees, unspecified chronicity     Plan: Impression is bilateral knee pain with primarily patellofemoral arthritis.  No effusion today.  I think a combination of episodic cortisone alternating with gel injections could give her relief once her symptoms reach the clinical threshold for intervention.  She is not quite there yet.  I think also that continued weight loss will help her knee symptoms considerably.  She will follow-up as needed.  Follow-Up Instructions: No follow-ups on file.   Orders:  Orders Placed This Encounter  Procedures   XR Knee 1-2 Views Right   XR KNEE 3 VIEW LEFT   No orders of the defined types were placed in this encounter.     Procedures: No procedures performed   Clinical Data: No additional findings.  Objective: Vital Signs:  There were no vitals taken for this visit.  Physical Exam:  Constitutional: Patient appears well-developed HEENT:  Head: Normocephalic Eyes:EOM are normal Neck: Normal range of motion Cardiovascular: Normal rate Pulmonary/chest: Effort normal Neurologic: Patient is alert Skin: Skin is warm Psychiatric: Patient has normal mood and affect  Ortho Exam: Ortho exam demonstrates range of motion both knees of about 3 degrees of hyperextension to 130 of flexion.  Collateral and cruciate ligaments are stable.  Patellofemoral crepitus is present.  Pedal pulses palpable.  Ankle dorsiflexion intact.  Extensor mechanism nontender and intact.  Negative patellar apprehension.  No groin pain with internal or external rotation of either leg.  Specialty Comments:  No specialty comments available.  Imaging: No results found.   PMFS History: Patient Active Problem List   Diagnosis Date Noted   Hirsutism 07/20/2022   Hyperglycemia due to type 2 diabetes mellitus ([place]) 07/20/2022   Type 2 diabetes mellitus without complication, without long-term current use of insulin ([employer]) 10/01/2018   Trigger finger, right ring finger 09/09/2015   Low back pain radiating to both legs 05/06/2012   Leg length discrepancy 05/06/2012   HYPERTENSION, BENIGN 04/11/2009   LEG EDEMA, BILATERAL 10/08/2008   CHRONIC MIGRAINE W/O AURA W/INTRACTABLE W/SM 03/09/2008  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 10/17/2006   Carpal tunnel syndrome, right upper limb 10/17/2006   Past Medical History:  Diagnosis Date   Anemia    no current med.   Apnea    Articular cartilage disorder of left shoulder region 10/2014   Back pain    Carpal tunnel syndrome    Complication of anesthesia    itching from last surgery that was completed at the surgery center off of [address] patient was given benedryl, generalized itching and they are not sure where  that came from   DDD (degenerative disc disease), lumbar    Dental crowns present    Diabetes mellitus without complication ([place])    Edema of lower extremity    bilateral   Frozen shoulder    surgery 11-04-14   GERD (gastroesophageal reflux disease)    no current med.   GERD (gastroesophageal reflux disease)    Heart murmur    last  echo- 2010   History of blood transfusion    History of shingles    Hyperlipidemia    Hypertension    borderline not on medicaiton at this time   Impingement syndrome of left shoulder region 10/2014   Joint pain    Migraines    Obesity    Rheumatoid arteritis ([facility])    Seasonal allergies    Sleep apnea    uses CPAP nightly   Trigger finger, left middle finger    Vitamin D deficiency    Wears partial dentures    upper    Family History  Problem Relation Age of Onset   Kidney disease Brother    Hypertension Brother    Leukemia Mother    Heart disease Mother    Stroke Mother    Cancer Mother    Depression Mother    Anxiety disorder Mother    AAA (abdominal aortic aneurysm) Father    Esophageal cancer Maternal Uncle    Hypertension Brother    Colon cancer Neg Hx    Colon polyps Neg Hx    Rectal cancer Neg Hx    Stomach cancer Neg Hx     Past Surgical History:  Procedure Laterality Date   CARPAL TUNNEL RELEASE Left 04/25/2006   CARPAL TUNNEL RELEASE Right 07/15/2018   Procedure: RIGHT CARPAL TUNNEL RELEASE;  Surgeon: Meredith Pel, MD;  Location: [facility];  Service: Orthopedics;  Laterality: Right;   CLOSED MANIPULATION SHOULDER WITH STERIOD INJECTION Left 02/03/2015   Procedure: LEFT  MANIPULATION SHOULDER UNDER ANESTHESIA WITH INJECTION OF STEROID;  Surgeon: Kathryne Hitch, MD;  Location: [facility];  Service: Orthopedics;  Laterality: Left;   COLONOSCOPY     COLONOSCOPY WITH PROPOFOL  10/09/2011   POLYPECTOMY     RESECTION DISTAL CLAVICAL Left 11/04/2014   Procedure: RESECTION DISTAL CLAVICAL;  Surgeon: Kathryne Hitch,  MD;  Location: [facility];  Service: Orthopedics;  Laterality: Left;   ROTATOR CUFF REPAIR  2010   SHOULDER ARTHROSCOPY WITH ROTATOR CUFF REPAIR Right 10/04/2008   SHOULDER ARTHROSCOPY WITH SUBACROMIAL DECOMPRESSION, ROTATOR CUFF REPAIR AND BICEP TENDON REPAIR Left 11/04/2014   Procedure: LEFT SHOULDER ARTHROSCOPY WITH DEBRIDEMENT, DISTAL CLAVICLE EXCISION, ACROMIOPLASTY, ROTATOR CUFF REPAIR AND BICEP TENODESIS;  Surgeon: Kathryne Hitch, MD;  Location: [facility];  Service: Orthopedics;  Laterality: Left;   TRIGGER FINGER RELEASE Right 02/03/2015   Procedure: RIGHT LONG FINGER TRIGGER RELEASE;  Surgeon:  Kathryne Hitch, MD;  Location: [facility];  Service: Orthopedics;  Laterality: Right;   TRIGGER FINGER RELEASE Left 10/10/2017   Procedure: LEFT HAND RELEASE TRIGGER FINGER 3RD LONG FINGER AND CYST EXCISION;  Surgeon: Meredith Pel, MD;  Location: [facility];  Service: Orthopedics;  Laterality: Left;   TRIGGER FINGER RELEASE Right 02/16/2021   Procedure: TRIGGER FINGER RELEASE RIGHT RING FINGER;  Surgeon: Meredith Pel, MD;  Location: [facility];  Service: Orthopedics;  Laterality: Right;   TUBAL LIGATION     VAGINAL HYSTERECTOMY  2001   partial   Social History   Occupational History   Not on file  Tobacco Use   Smoking status: Never   Smokeless tobacco: Never  Vaping Use   Vaping Use: Never used  Substance and Sexual Activity   Alcohol use: Yes    Comment: occ with a special occ   Drug use: No   Sexual activity: Not on file

## 2022-10-16 ENCOUNTER — Other Ambulatory Visit (HOSPITAL_COMMUNITY): Payer: Self-pay

## 2022-10-16 ENCOUNTER — Other Ambulatory Visit: Payer: Self-pay

## 2022-10-18 ENCOUNTER — Other Ambulatory Visit (HOSPITAL_COMMUNITY): Payer: Self-pay

## 2022-11-02 ENCOUNTER — Encounter: Payer: Self-pay | Admitting: Podiatry

## 2022-11-02 ENCOUNTER — Ambulatory Visit (INDEPENDENT_AMBULATORY_CARE_PROVIDER_SITE_OTHER): Payer: HMO | Admitting: Podiatry

## 2022-11-02 VITALS — BP 138/70 | HR 71

## 2022-11-02 DIAGNOSIS — B351 Tinea unguium: Secondary | ICD-10-CM

## 2022-11-02 DIAGNOSIS — E1169 Type 2 diabetes mellitus with other specified complication: Secondary | ICD-10-CM

## 2022-11-02 DIAGNOSIS — E1142 Type 2 diabetes mellitus with diabetic polyneuropathy: Secondary | ICD-10-CM

## 2022-11-02 NOTE — Progress Notes (Unsigned)
  Subjective:  Patient ID: Andrea Montes, female    DOB: July 13, 1956,  MRN: [id number]  CAELIA Montes presents to clinic today for at risk foot care with history of diabetic neuropathy and painful thick toenails that are difficult to trim. Pain interferes with ambulation. Aggravating factors include wearing enclosed shoe gear. Pain is relieved with periodic professional debridement.  Chief Complaint  Patient presents with   Nail Problem    Trim, A1C:5.9,did not take BS today   New problem(s): None.   PCP is Seward Carol, MD.  Allergies  Allergen Reactions   Lisinopril Other (See Comments), Shortness Of Breath and Cough    wheezing  Other Reaction(s): SOB/Wheezing   Kiwi Extract Itching and Other (See Comments)    Itching in mouth with KIWI, And pineapple juice- topically irritating    Pineapple Flavor Itching    Other reaction(s): Unknown    Review of Systems: Negative except as noted in the HPI.  Objective: No changes noted in today's physical examination. Vitals:   11/02/22 0819  BP: 138/70  Pulse: 71   Andrea Montes is a pleasant 67 y.o. female morbidly obese in NAD. AAO x 3. Vascular Examination: Capillary refill time immediate b/l. Vascular status intact b/l with palpable pedal pulses. Pedal hair present b/l. No edema. No pain with calf compression b/l. Skin temperature gradient WNL b/l.   Neurological Examination: Sensation grossly intact b/l with 10 gram monofilament. Vibratory sensation intact b/l. Pt has subjective symptoms of neuropathy.  Dermatological Examination: Pedal skin with normal turgor, texture and tone b/l. Toenails 1-5 b/l thick, discolored, elongated with subungual debris and pain on dorsal palpation. No open wounds b/l LE. No interdigital macerations noted b/l LE. No hyperkeratotic nor porokeratotic lesions present on today's visit.  Musculoskeletal Examination: Normal muscle strength 5/5 to all lower extremity muscle groups bilaterally.  Pes planus deformity noted bilateral LE.[name] No pain, crepitus or joint limitation noted with ROM b/l LE.  Patient ambulates independently without assistive aids.  Radiographs: None  Assessment/Plan: 1. Onychomycosis of multiple toenails with type 2 diabetes mellitus and peripheral neuropathy ([place])   2. Diabetic peripheral neuropathy associated with type 2 diabetes mellitus ([place])     No orders of the defined types were placed in this encounter.   None -Patient was evaluated and treated. All patient's and/or POA's questions/concerns answered on today's visit. -Continue foot and shoe inspections daily. Monitor blood glucose per PCP/Endocrinologist's recommendations. -Patient to continue soft, supportive shoe gear daily. -Mycotic toenails 1-5 bilaterally were debrided in length and girth with sterile nail nippers and dremel without incident. -Patient/POA to call should there be question/concern in the interim.   Return in about 3 months (around 02/02/2023).  Marzetta Board, DPM

## 2022-11-21 ENCOUNTER — Other Ambulatory Visit (HOSPITAL_COMMUNITY): Payer: Self-pay

## 2022-11-21 ENCOUNTER — Other Ambulatory Visit: Payer: Self-pay | Admitting: Podiatry

## 2022-11-21 DIAGNOSIS — R21 Rash and other nonspecific skin eruption: Secondary | ICD-10-CM

## 2022-11-21 DIAGNOSIS — L299 Pruritus, unspecified: Secondary | ICD-10-CM

## 2022-11-22 ENCOUNTER — Other Ambulatory Visit (HOSPITAL_COMMUNITY): Payer: Self-pay

## 2022-11-22 MED ORDER — TRIAMCINOLONE ACETONIDE 0.1 % EX OINT
TOPICAL_OINTMENT | Freq: Two times a day (BID) | CUTANEOUS | 0 refills | Status: DC
  Filled 2022-11-22: qty 454, 30d supply, fill #0

## 2022-11-22 MED ORDER — FREESTYLE LITE W/DEVICE KIT
1.0000 | PACK | 0 refills | Status: AC
  Filled 2022-11-22: qty 1, 30d supply, fill #0

## 2022-11-23 ENCOUNTER — Other Ambulatory Visit (HOSPITAL_COMMUNITY): Payer: Self-pay

## 2022-11-27 ENCOUNTER — Other Ambulatory Visit (HOSPITAL_COMMUNITY): Payer: Self-pay

## 2022-11-27 MED ORDER — TRUEPLUS LANCETS 30G MISC
3 refills | Status: AC
  Filled 2022-11-27: qty 100, 90d supply, fill #0
  Filled 2023-02-04: qty 100, 90d supply, fill #1
  Filled 2023-08-21: qty 100, 90d supply, fill #2

## 2022-11-27 MED ORDER — GLUCOSE BLOOD VI STRP
ORAL_STRIP | 3 refills | Status: AC
  Filled 2022-11-27 (×2): qty 100, 90d supply, fill #0
  Filled 2023-02-04: qty 100, 90d supply, fill #1
  Filled 2023-11-14: qty 100, 100d supply, fill #2

## 2022-11-27 MED ORDER — BLOOD GLUCOSE METER KIT
PACK | 0 refills | Status: AC
  Filled 2022-11-27: qty 1, 30d supply, fill #0
  Filled 2022-11-27: qty 1, 90d supply, fill #0

## 2022-12-07 ENCOUNTER — Other Ambulatory Visit (HOSPITAL_COMMUNITY): Payer: Self-pay

## 2022-12-07 ENCOUNTER — Ambulatory Visit (INDEPENDENT_AMBULATORY_CARE_PROVIDER_SITE_OTHER): Payer: HMO | Admitting: Podiatry

## 2022-12-07 DIAGNOSIS — E0840 Diabetes mellitus due to underlying condition with diabetic neuropathy, unspecified: Secondary | ICD-10-CM | POA: Diagnosis not present

## 2022-12-07 DIAGNOSIS — L97512 Non-pressure chronic ulcer of other part of right foot with fat layer exposed: Secondary | ICD-10-CM

## 2022-12-07 DIAGNOSIS — M722 Plantar fascial fibromatosis: Secondary | ICD-10-CM | POA: Diagnosis not present

## 2022-12-07 MED ORDER — BETAMETHASONE SOD PHOS & ACET 6 (3-3) MG/ML IJ SUSP
3.0000 mg | Freq: Once | INTRAMUSCULAR | Status: AC
  Administered 2022-12-07: 3 mg via INTRA_ARTICULAR

## 2022-12-07 MED ORDER — MUPIROCIN 2 % EX OINT
1.0000 | TOPICAL_OINTMENT | Freq: Two times a day (BID) | CUTANEOUS | 1 refills | Status: AC
  Filled 2022-12-07: qty 22, 11d supply, fill #0
  Filled 2023-02-01: qty 22, 11d supply, fill #1

## 2022-12-07 NOTE — Progress Notes (Signed)
Chief Complaint  Patient presents with   Diabetes    Diabetic  A1c-5.9 BG- 105, patient came in today for right foot wound, top of the foot, started 2 weeks ago, patient has some drainage, rate of pain 5 out of 10, patient is also have bilateral heel pain,     Subjective: 67 y.o. female for evaluation of plantar fasciitis to the bilateral heels.  She has been diagnosed with plantar fasciitis in the past and she says the injections helped significantly.  Patient also noticed a few weeks ago that she scraped the top of her right foot.  Being diabetic she is somewhat concerned.  She has not done anything for treatment other than foot lotion but she says the wound is improving.  She comes in routinely for routine footcare.   Past Medical History:  Diagnosis Date   Anemia    no current med.   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   GERD (gastroesophageal reflux disease)    Heart murmur    last  echo- 2010   History of blood transfusion    History of shingles    Hyperlipidemia    Hypertension    borderline not on medicaiton at this time   Impingement syndrome of left shoulder region 10/2014   Joint pain    Migraines    Obesity    Rheumatoid arteritis ([facility])    Seasonal allergies    Sleep apnea    uses CPAP nightly   Trigger finger, left middle finger    Vitamin D deficiency    Wears partial dentures    upper    RT foot  12/07/2022  Objective: Physical Exam General: The patient is alert and oriented x3 in no acute distress.  Dermatology: Skin is warm, dry and supple bilateral lower extremities.  Superficial wounds noted to the dorsum of the right foot with well adhered eschar.  No drainage.  There is some slight areas of inflamed dermatitis as well localized to the forefoot.  There is not appear to be any deeper underlying cellulitis  Vascular: Dorsalis Pedis and Posterior Tibial pulses palpable bilateral.  Capillary fill time is immediate to all digits.  Neurological: Epicritic and protective threshold intact bilateral.   Musculoskeletal: Tenderness to palpation to the plantar aspect of the bilateral heels along the plantar fascia. All other joints range of motion within normal limits bilateral. Strength 5/5 in all groups bilateral.    Assessment: 1. plantar fasciitis bilateral feet -Injection of 0.5 cc Celestone Soluspan injected bilateral heels -Patient is currently taking meloxicam 15 mg daily.  Continue -Advised against going barefoot.  Recommend good supportive shoes and sneakers  2.  Superficial wound dorsum of the right foot with some mild areas of dermatitis -Prescription for mupirocin 2% ointment apply 2 times daily  -Return to clinic next scheduled appointment with Dr. Loraine Grip  Arlyce Dice, DPM [facility]  Dr. Felecia Shelling, DPM    [address] [place], [place] [address]                Office [phone]  Fax [phone]

## 2022-12-11 ENCOUNTER — Other Ambulatory Visit (HOSPITAL_COMMUNITY): Payer: Self-pay

## 2022-12-11 DIAGNOSIS — N390 Urinary tract infection, site not specified: Secondary | ICD-10-CM | POA: Diagnosis not present

## 2022-12-11 DIAGNOSIS — R399 Unspecified symptoms and signs involving the genitourinary system: Secondary | ICD-10-CM | POA: Diagnosis not present

## 2022-12-12 ENCOUNTER — Other Ambulatory Visit (HOSPITAL_COMMUNITY): Payer: Self-pay

## 2022-12-12 MED ORDER — FLUCONAZOLE 150 MG PO TABS
150.0000 mg | ORAL_TABLET | ORAL | 0 refills | Status: DC
  Filled 2022-12-12: qty 2, 3d supply, fill #0

## 2022-12-12 MED ORDER — PHENAZOPYRIDINE HCL 200 MG PO TABS
200.0000 mg | ORAL_TABLET | Freq: Three times a day (TID) | ORAL | 0 refills | Status: AC
  Filled 2022-12-12: qty 6, 2d supply, fill #0

## 2022-12-12 MED ORDER — CEPHALEXIN 500 MG PO CAPS
500.0000 mg | ORAL_CAPSULE | Freq: Four times a day (QID) | ORAL | 0 refills | Status: AC
  Filled 2022-12-12: qty 28, 7d supply, fill #0

## 2023-02-01 ENCOUNTER — Other Ambulatory Visit (HOSPITAL_COMMUNITY): Payer: Self-pay

## 2023-02-01 MED ORDER — FUROSEMIDE 20 MG PO TABS
20.0000 mg | ORAL_TABLET | Freq: Every day | ORAL | 3 refills | Status: DC
  Filled 2023-02-01: qty 90, 90d supply, fill #0
  Filled 2023-08-21: qty 90, 90d supply, fill #1

## 2023-02-04 ENCOUNTER — Other Ambulatory Visit (HOSPITAL_COMMUNITY): Payer: Self-pay

## 2023-02-08 ENCOUNTER — Other Ambulatory Visit (HOSPITAL_COMMUNITY): Payer: Self-pay

## 2023-02-11 ENCOUNTER — Other Ambulatory Visit (HOSPITAL_COMMUNITY): Payer: Self-pay

## 2023-02-18 ENCOUNTER — Ambulatory Visit (INDEPENDENT_AMBULATORY_CARE_PROVIDER_SITE_OTHER): Payer: HMO

## 2023-02-18 ENCOUNTER — Encounter (HOSPITAL_BASED_OUTPATIENT_CLINIC_OR_DEPARTMENT_OTHER): Payer: Self-pay | Admitting: Student

## 2023-02-18 ENCOUNTER — Ambulatory Visit (INDEPENDENT_AMBULATORY_CARE_PROVIDER_SITE_OTHER): Payer: HMO | Admitting: Student

## 2023-02-18 DIAGNOSIS — M1711 Unilateral primary osteoarthritis, right knee: Secondary | ICD-10-CM | POA: Diagnosis not present

## 2023-02-18 DIAGNOSIS — M1712 Unilateral primary osteoarthritis, left knee: Secondary | ICD-10-CM | POA: Diagnosis not present

## 2023-02-18 DIAGNOSIS — M25562 Pain in left knee: Secondary | ICD-10-CM

## 2023-02-18 DIAGNOSIS — G8929 Other chronic pain: Secondary | ICD-10-CM | POA: Diagnosis not present

## 2023-02-18 DIAGNOSIS — M170 Bilateral primary osteoarthritis of knee: Secondary | ICD-10-CM | POA: Diagnosis not present

## 2023-02-18 DIAGNOSIS — M25561 Pain in right knee: Secondary | ICD-10-CM | POA: Diagnosis not present

## 2023-02-18 MED ORDER — LIDOCAINE HCL 1 % IJ SOLN
4.0000 mL | INTRAMUSCULAR | Status: AC | PRN
  Administered 2023-02-18: 4 mL

## 2023-02-18 MED ORDER — TRIAMCINOLONE ACETONIDE 40 MG/ML IJ SUSP
2.0000 mL | INTRAMUSCULAR | Status: AC | PRN
  Administered 2023-02-18: 2 mL via INTRA_ARTICULAR

## 2023-02-18 NOTE — Progress Notes (Signed)
Chief Complaint: Bilateral knee pain     History of Present Illness:    Andrea Montes is a 67 y.o. female presenting to clinic today for evaluation of pain in both knees.  She had a fall 2 days ago while walking up to her house.  She is unsure of what caused her to lose balance, but she fell forward with both knees hitting the cement.  Denies any head injury.  Pain is located over the front of the knees and is worse in the left knee than the right.  Pain levels are a 4 out of 10 at rest however increases to 10 out of 10 with knee flexion.  Has been able to walk with a limp but no assistive device.  Has tried icing, rest, and ibuprofen.  Has had trouble sleeping the past 2 nights due to pain.  She is a patient of Dr. August Saucer.   Surgical History:   No previous knee surgeries  PMH/PSH/Family History/Social History/Meds/Allergies:    Past Medical History:  Diagnosis Date   Anemia    no current med.   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   GERD (gastroesophageal reflux disease)    Heart murmur    last  echo- 2010   History of blood transfusion    History of shingles    Hyperlipidemia    Hypertension    borderline not on medicaiton at this time   Impingement syndrome of left shoulder region 10/2014   Joint pain    Migraines    Obesity    Rheumatoid arteritis (HCC)    Seasonal allergies    Sleep apnea    uses CPAP  nightly   Trigger finger, left middle finger    Vitamin D deficiency    Wears partial dentures    upper   Past Surgical History:  Procedure Laterality Date   CARPAL TUNNEL RELEASE Left 04/25/2006   CARPAL TUNNEL RELEASE Right 07/15/2018   Procedure: RIGHT CARPAL TUNNEL RELEASE;  Surgeon: Cammy Copa, MD;  Location: [facility] OR;  Service: Orthopedics;  Laterality: Right;   CLOSED MANIPULATION SHOULDER WITH STERIOD INJECTION Left 02/03/2015   Procedure: LEFT  MANIPULATION SHOULDER UNDER ANESTHESIA WITH INJECTION OF STEROID;  Surgeon: Mckinley Jewel, MD;  Location: [facility];  Service: Orthopedics;  Laterality: Left;   COLONOSCOPY     COLONOSCOPY WITH PROPOFOL  10/09/2011   POLYPECTOMY     RESECTION DISTAL CLAVICAL Left 11/04/2014   Procedure: RESECTION DISTAL CLAVICAL;  Surgeon: Mckinley Jewel, MD;  Location:  SURGERY CENTER;  Service: Orthopedics;  Laterality: Left;   ROTATOR CUFF REPAIR  2010   SHOULDER ARTHROSCOPY WITH ROTATOR CUFF REPAIR Right 10/04/2008  SHOULDER ARTHROSCOPY WITH SUBACROMIAL DECOMPRESSION, ROTATOR CUFF REPAIR AND BICEP TENDON REPAIR Left 11/04/2014   Procedure: LEFT SHOULDER ARTHROSCOPY WITH DEBRIDEMENT, DISTAL CLAVICLE EXCISION, ACROMIOPLASTY, ROTATOR CUFF REPAIR AND BICEP TENODESIS;  Surgeon: Mckinley Jewel, MD;  Location: [facility];  Service: Orthopedics;  Laterality: Left;   TRIGGER FINGER RELEASE Right 02/03/2015   Procedure: RIGHT LONG FINGER TRIGGER RELEASE;  Surgeon: Mckinley Jewel, MD;  Location: [facility];  Service: Orthopedics;  Laterality: Right;   TRIGGER FINGER RELEASE Left 10/10/2017   Procedure: LEFT HAND RELEASE TRIGGER FINGER 3RD LONG FINGER AND CYST EXCISION;  Surgeon: Cammy Copa, MD;  Location: [facility] OR;  Service: Orthopedics;  Laterality: Left;   TRIGGER FINGER RELEASE Right 02/16/2021   Procedure: TRIGGER FINGER RELEASE RIGHT RING FINGER;  Surgeon: Cammy Copa, MD;  Location: [facility] OR;   Service: Orthopedics;  Laterality: Right;   TUBAL LIGATION     VAGINAL HYSTERECTOMY  2001   partial   Social History   Socioeconomic History   Marital status: Legally Separated    Spouse name: Not on file   Number of children: Not on file   Years of education: Not on file   Highest education level: Not on file  Occupational History   Not on file  Tobacco Use   Smoking status: Never   Smokeless tobacco: Never  Vaping Use   Vaping Use: Never used  Substance and Sexual Activity   Alcohol use: Yes    Comment: occ with a special occ   Drug use: No   Sexual activity: Not on file  Other Topics Concern   Not on file  Social History Narrative   Not on file   Social Determinants of Health   Financial Resource Strain: Not on file  Food Insecurity: Not on file  Transportation Needs: Not on file  Physical Activity: Not on file  Stress: Not on file  Social Connections: Not on file   Family History  Problem Relation Age of Onset   Kidney disease Brother    Hypertension Brother    Leukemia Mother    Heart disease Mother    Stroke Mother    Cancer Mother    Depression Mother    Anxiety disorder Mother    AAA (abdominal aortic aneurysm) Father    Esophageal cancer Maternal Uncle    Hypertension Brother    Colon cancer Neg Hx    Colon polyps Neg Hx    Rectal cancer Neg Hx    Stomach cancer Neg Hx    Allergies  Allergen Reactions   Lisinopril Other (See Comments), Shortness Of Breath and Cough    wheezing  Other Reaction(s): SOB/Wheezing   Kiwi Extract Itching and Other (See Comments)    Itching in mouth with KIWI, And pineapple juice- topically irritating    Pineapple Flavor Itching    Other reaction(s): Unknown   Current Outpatient Medications  Medication Sig Dispense Refill   acyclovir (ZOVIRAX) 400 MG tablet Take 1 tablet (400 mg total) by mouth 4 (four) times daily. 50 tablet 0   Aspirin-Acetaminophen-Caffeine (GOODY HEADACHE PO) Take 1 Package by mouth as  needed (pain).     atorvastatin (LIPITOR) 10 MG tablet TAKE 1 TABLET (10 MG TOTAL) BY MOUTH AT BEDTIME. 90 tablet 3   blood glucose meter kit and supplies Check blood sugars as directed 1 each 0   Blood Glucose Monitoring Suppl (FREESTYLE LITE) w/Device KIT 1 kit. Use as directed 1 kit 0   Blood  Pressure Monitoring (OMRON 3 SERIES BP MONITOR) DEVI      calcium carbonate (TUMS EX) 750 MG chewable tablet Chew 1-2 tablets by mouth 2 (two) times daily as needed for heartburn.     cephALEXin (KEFLEX) 500 MG capsule Take 1 capsule (500 mg total) by mouth 4 (four) times daily for 7 days 28 capsule 0   Cholecalciferol (VITAMIN D3) 50 MCG (2000 UT) TABS Take 2,000 Units by mouth every Friday.     clotrimazole-betamethasone (LOTRISONE) cream Apply 1 Application topically daily. 30 g 0   Continuous Blood Gluc Sensor (FREESTYLE LIBRE 3 SENSOR) MISC Apply to upper back or arm change every 14 (fourteen) days. 2 each 11   COVID-19 At Home Antigen Test (CARESTART COVID-19 HOME TEST) KIT Use as directed 4 each 0   empagliflozin (JARDIANCE) 25 MG TABS tablet Take 1 tablet (25 mg total) by mouth daily. 90 tablet 3   empagliflozin (JARDIANCE) 25 MG TABS tablet Take 1 tablet (25 mg total) by mouth daily. 90 tablet 3   fluconazole (DIFLUCAN) 150 MG tablet Take 1 tablet (150 mg total) by mouth on day 1 and 1 tablet on day 3 as directed 2 tablet 0   FREESTYLE LITE test strip      furosemide (LASIX) 20 MG tablet Take 1 tablet (20 mg total) by mouth daily. 90 tablet 3   furosemide (LASIX) 20 MG tablet Take 1 tablet by mouth once daily 90 tablet 3   glucose blood test strip Check blood sugars as directed. 100 each 3   Insulin Pen Needle 31G X 5 MM MISC Use once daily as directed 100 each 3   Lancets (FREESTYLE) lancets 1 each 3 (three) times daily.     TRUEplus Lancets 30G MISC Use as directed 100 each 3   losartan (COZAAR) 50 MG tablet Take 1 tablet (50 mg total) by mouth daily. 90 tablet 3   meloxicam (MOBIC) 15 MG  tablet Take 1 tablet (15 mg total) by mouth daily as needed for pain. 60 tablet 1   meloxicam (MOBIC) 15 MG tablet Take 1 tablet by mouth once daily 90 tablet 3   metFORMIN (GLUCOPHAGE-XR) 750 MG 24 hr tablet Take 2 tablets (1,500 mg total) by mouth daily with food 180 tablet 3   methocarbamol (ROBAXIN) 750 MG tablet Take 1 tablet (750 mg total) by mouth every 8 (eight) hours as needed. 30 tablet 1   Multiple Vitamin (MULTIVITAMIN WITH MINERALS) TABS tablet Take 1 tablet by mouth every other day. In the morning     mupirocin ointment (BACTROBAN) 2 % Apply 1 Application topically 2 (two) times daily. 22 g 1   phenazopyridine (PYRIDIUM) 200 MG tablet Take 1 tablet (200 mg total) by mouth 3 (three) times daily after meals. 6 tablet 0   Semaglutide, 2 MG/DOSE, (OZEMPIC, 2 MG/DOSE,) 8 MG/3ML SOPN Inject 2 mg into the skin once a week. 3 mL 11   SUMAtriptan (IMITREX) 25 MG tablet TAKE 1 TABLET BY MOUTH AT ONSET OF MIGRAINE, MAY REPEAT IN 1 HR IF NEEDED 15 tablet 5   traMADol (ULTRAM) 50 MG tablet Take 1 tablet (50 mg total) by mouth every 6 (six) hours as needed. 30 tablet 0   triamcinolone ointment (KENALOG) 0.1 % Apply to affected foot twice daily. 454 g 0   Current Facility-Administered Medications  Medication Dose Route Frequency Provider Last Rate Last Admin   0.9 %  sodium chloride infusion  500 mL Intravenous Continuous Nandigam, Eleonore Chiquito, MD  No results found.  Review of Systems:   A ROS was performed including pertinent positives and negatives as documented in the HPI.  Physical Exam :   Constitutional: NAD and appears stated age Neurological: Alert and oriented Psych: Appropriate affect and cooperative There were no vitals taken for this visit.   Comprehensive Musculoskeletal Exam:      Musculoskeletal Exam  Gait Antalgic  Alignment Normal   Right Left  Inspection Normal Normal  Palpation    Tenderness Very mild over patella Significant medial to the patella  Crepitus  Present Present  Effusion None Mild to moderate  Range of Motion    Extension 0 0  Flexion 120 90  Strength    Extension 5/5 4/5  Flexion 5/5 3/5  Ligament Exam     Generalized Laxity No No  Lachman Negative Negative   Valgus at 0 Negative Negative  Valgus at 20 Negative Negative  Varus at 0 0 0  Varus at 20   0 0  Vascular/Lymphatic Exam    Edema None None  Venous Stasis Changes No No  Distal Circulation Normal Normal  Neurologic    Light Touch Sensation Intact Intact  Special Tests:      Imaging:   Xray (right knee 4 views): No evidence of fracture or dislocation.  Moderate to severe patellofemoral osteoarthritis.  Medial and lateral joint space preserved.  Xray (left knee 4 views): No evidence of fracture or dislocation.  Osteophytes noted off of the medial and lateral joint lines.  Moderate to severe patellofemoral osteoarthritis.  I personally reviewed and interpreted the radiographs.   Assessment:   67 y.o. female with bilateral knee osteoarthritis worse in both patellofemoral compartments.  She has bilateral knee pain that is much worse on the left side after direct trauma to the knees from a fall 2 days ago.  The left knee is much more swollen and tender, and she is unable to bend past 90 due to pain.  She also notes occasional catching of the left knee with movement.  X-rays are negative for acute bony abnormality however patellofemoral compartments of both knees are significantly arthritic.  I do suspect that this is likely the cause of her pain however cannot completely rule out meniscal injury due to her symptoms.  At this point I did recommend performing a cortisone injection of the left knee for pain and symptom relief.  The right knee is not bothering her too much so I would like to hold off on an injection of this today.  Ultrasound guided cortisone injection was performed of the left knee today.  Discussed to continue following up with Dr. August Saucer as needed for knee  pain, particularly should she not get significant relief from injection.  Plan :    -Cortisone injection performed of left knee today -Continue following with Dr. August Saucer as needed     Procedure Note  Patient: Andrea Montes             Date of Birth: 01/23/1956           MRN: [id number]             Visit Date: 02/18/2023  Procedures: Visit Diagnoses:  1. Bilateral primary osteoarthritis of knee   2. Acute pain of left knee     Large Joint Inj: L knee on 02/18/2023 12:22 PM Indications: pain Details: 22 G 1.5 in needle, ultrasound-guided anterolateral approach Medications: 4 mL lidocaine 1 %; 2 mL triamcinolone acetonide 40 MG/ML Outcome:  tolerated well, no immediate complications Consent was given by the patient. Patient was prepped and draped in the usual sterile fashion.      I personally saw and evaluated the patient, and participated in the management and treatment plan.  Hazle Nordmann, PA-C Orthopedics  This document was dictated using Conservation officer, historic buildings. A reasonable attempt at proof reading has been made to minimize errors.

## 2023-03-06 ENCOUNTER — Other Ambulatory Visit (HOSPITAL_COMMUNITY): Payer: Self-pay

## 2023-03-12 ENCOUNTER — Other Ambulatory Visit (HOSPITAL_COMMUNITY): Payer: Self-pay

## 2023-03-12 ENCOUNTER — Ambulatory Visit: Payer: HMO | Admitting: Podiatry

## 2023-03-12 ENCOUNTER — Ambulatory Visit (INDEPENDENT_AMBULATORY_CARE_PROVIDER_SITE_OTHER): Payer: HMO

## 2023-03-12 ENCOUNTER — Other Ambulatory Visit: Payer: Self-pay | Admitting: Podiatry

## 2023-03-12 ENCOUNTER — Encounter: Payer: Self-pay | Admitting: Podiatry

## 2023-03-12 DIAGNOSIS — M79672 Pain in left foot: Secondary | ICD-10-CM | POA: Diagnosis not present

## 2023-03-12 DIAGNOSIS — S90852A Superficial foreign body, left foot, initial encounter: Secondary | ICD-10-CM

## 2023-03-12 DIAGNOSIS — R399 Unspecified symptoms and signs involving the genitourinary system: Secondary | ICD-10-CM | POA: Insufficient documentation

## 2023-03-12 MED ORDER — DOXYCYCLINE HYCLATE 100 MG PO CAPS
100.0000 mg | ORAL_CAPSULE | Freq: Two times a day (BID) | ORAL | 0 refills | Status: AC
  Filled 2023-03-12: qty 20, 10d supply, fill #0

## 2023-03-14 ENCOUNTER — Other Ambulatory Visit (HOSPITAL_COMMUNITY): Payer: Self-pay

## 2023-03-17 NOTE — Progress Notes (Cosign Needed)
  Subjective:  Patient ID: Andrea Montes, female    DOB: 1956-02-15,  MRN: [id number]  Andrea Montes presents to clinic today for with chief concern of injury to left foot. Injury occurred several days ago. Injury recurred as a result of a broken glass. Patient states she broke a glass and feels she may have stepped on a piece she didn't sweep up. Patient states aggravating factor(s) is/are direct pressure and weight bearing.  Patient has tried none.  Chief Complaint  Patient presents with   Diabetes    [facility] BS - 120 A1C - 3.1 LVPCP - 08/2022   Foot Pain    LEFT FOOT PAIN, SHE THINKS THAT SHE HAS GLASS IN HER FOOT. HAPPENED A WEEK AGO   New problem(s): None.   PCP is Renford Dills, MD.  Allergies  Allergen Reactions   Lisinopril Other (See Comments), Shortness Of Breath and Cough    wheezing  Other Reaction(s): SOB/Wheezing   Kiwi Extract Itching and Other (See Comments)    Itching in mouth with KIWI, And pineapple juice- topically irritating    Pineapple Flavor Itching    Other reaction(s): Unknown    Review of Systems: Negative except as noted in the HPI.  Objective:  There were no vitals filed for this visit. Andrea Montes is a pleasant 67 y.o. female in NAD. AAO x 3.  Vascular Examination: Capillary refill time immediate b/l. Vascular status intact b/l with palpable pedal pulses. Pedal hair present b/l. No edema. No pain with calf compression b/l. Skin temperature gradient WNL b/l.   Neurological Examination: Sensation grossly intact b/l with 10 gram monofilament. Vibratory sensation intact b/l. Pt has subjective symptoms of neuropathy.  Dermatological Examination:  Area of left heel raised with tenderness to palpation with overlying area of mild hyperkeratosis.  Pedal skin with normal turgor, texture and tone b/l. Toenails 1-5 b/l thick, discolored, elongated with subungual debris and pain on dorsal palpation. No interdigital macerations noted b/l LE. No  hyperkeratotic nor porokeratotic lesions present on today's visit.   Musculoskeletal Examination: She has an antalgic gait of Normal muscle strength 5/5 to all lower extremity muscle groups bilaterally. Pes planus deformity noted bilateral LE.   Xray findings left foot: No gas in tissues left foot. Plantar calcaneal spur noted left foot. Posterior calcaneal spur noted left foot. No evidence of fracture left foot. Foreign body noted  plantar aspect left heel  Assessment/Plan: 1. Foreign body in left foot, initial encounter     Meds ordered this encounter  Medications   doxycycline (VIBRAMYCIN) 100 MG capsule    Sig: Take 1 capsule (100 mg total) by mouth 2 (two) times daily for 10 days.    Dispense:  20 capsule    Refill:  0    -Discussed suspected FBO of glass and need for xray of left foot. -Xray of left foot was performed and reviewed with patient and/or POA. -Rx for Doxycyline 100 mg, #20, to be taken one capsule twice daily for 10 days. Also apply Neosporin to left heel once daily. I will see her next week for her diabetic foot care. Call office if she experiences worsening of symptoms, redness, swelling, or drainage of foot. -Betadine prep performed with attempted evacuation of FBO. I was unsuccessful and Dr. Allena Katz was consulted. Please see his addendum to this note. -Patient/POA to call should there be question/concern in the interim.   Return in about 1 week (around 03/19/2023).  Freddie Breech, DPM

## 2023-03-20 ENCOUNTER — Telehealth: Payer: Self-pay

## 2023-03-20 ENCOUNTER — Ambulatory Visit (INDEPENDENT_AMBULATORY_CARE_PROVIDER_SITE_OTHER): Payer: HMO | Admitting: Orthopedic Surgery

## 2023-03-20 DIAGNOSIS — M170 Bilateral primary osteoarthritis of knee: Secondary | ICD-10-CM | POA: Diagnosis not present

## 2023-03-20 NOTE — Telephone Encounter (Signed)
Auth needed for left knee gel 

## 2023-03-20 NOTE — Telephone Encounter (Signed)
VOB submitted for Monovisc, left knee 

## 2023-03-21 ENCOUNTER — Encounter: Payer: Self-pay | Admitting: Podiatry

## 2023-03-21 ENCOUNTER — Ambulatory Visit (INDEPENDENT_AMBULATORY_CARE_PROVIDER_SITE_OTHER): Payer: HMO | Admitting: Podiatry

## 2023-03-21 DIAGNOSIS — B351 Tinea unguium: Secondary | ICD-10-CM | POA: Diagnosis not present

## 2023-03-21 DIAGNOSIS — S90852A Superficial foreign body, left foot, initial encounter: Secondary | ICD-10-CM | POA: Diagnosis not present

## 2023-03-21 DIAGNOSIS — M79674 Pain in right toe(s): Secondary | ICD-10-CM

## 2023-03-21 DIAGNOSIS — M79675 Pain in left toe(s): Secondary | ICD-10-CM | POA: Diagnosis not present

## 2023-03-21 DIAGNOSIS — B353 Tinea pedis: Secondary | ICD-10-CM

## 2023-03-21 DIAGNOSIS — E1142 Type 2 diabetes mellitus with diabetic polyneuropathy: Secondary | ICD-10-CM

## 2023-03-21 NOTE — Patient Instructions (Addendum)
Discontinue Ketoconazole Cream.  Start Clotrimazole Cream  Continue Mupirocin Ointment to left heel once daily until healed.  Clotrimazole Cream, Lotion, Ointment, or Solution What is this medication? CLOTRIMAZOLE (kloe TRIM a zole) treats fungal or yeast infections of the skin. It belongs to a group of medications called antifungals. It will not treat infections caused by bacteria or viruses. This medicine may be used for other purposes; ask your health care provider or pharmacist if you have questions. COMMON BRAND NAME(S): Alevazol, Anti-Fungal, Antifungal, ATHLETE'S FOOT, Cruex, Desenex, Fungoid, Lotrimin, Lotrimin AF, Lotrimin AF Ringworm, Micotrin AC, Mycozyl AC, [facility] Clotrimazole What should I tell my care team before I take this medication? They need to know if you have any of these conditions: An unusual or allergic reaction to clotrimazole, other medications, foods, dyes, or preservatives Pregnant or trying to get pregnant Breast-feeding How should I use this medication? This medication is for external use only. Do not take by mouth. Wash your hands before and after use. If you are treating your hands, only wash your hands before use. Do not get this medication in your eyes. If you do, rinse them out with plenty of cool tap water. Use this medication as directed on the label at the same time every day. Do not use it more often than directed. Use the medication for the full course as directed by your care team, even if you think you are better. Do not stop using it unless your care team tells you to stop it early. Apply a thin film of the medication to the affected area. Do not use an airtight bandage (such as a plastic-covered bandage). Talk to your care team about the use of this medication in children. Special care may be needed. Overdosage: If you think you have taken too much of this medicine contact a poison control center or emergency room at once. NOTE: This medicine is  only for you. Do not share this medicine with others. What if I miss a dose? If you miss a dose, use it as soon as you can. If it is almost time for your next dose, use only that dose. Do not use double or extra doses. What may interact with this medication? Amphotericin b Topical products that have nystatin This list may not describe all possible interactions. Give your health care provider a list of all the medicines, herbs, non-prescription drugs, or dietary supplements you use. Also tell them if you smoke, drink alcohol, or use illegal drugs. Some items may interact with your medicine. What should I watch for while using this medication? Visit your care team for regular checks on your progress. Tell your care team if your symptoms do not start to get better or if they get worse. After bathing, make sure your skin is very dry. Fungal infections like moist conditions. Do not walk around barefoot. To help prevent reinfection, wear freshly washed cotton, not synthetic, clothing. Tell your care team if you develop sores or blisters that do not heal properly. If your skin infection returns after you stop using this medication, contact your care team. If you are using this medication for jock itch, do not wear underwear that is tight-fitting or made from synthetic fibers such as rayon or nylon. Instead, wear loose-fitting, cotton underwear. Dry the area completely after bathing. If you are using this medication to treat athlete's foot, carefully dry the feet, especially between the toes after bathing. Do not wear socks made from wool or synthetic materials such as  rayon or nylon. Wear clean cotton socks and change them at least once a day. Wear sandals or shoes that are well-ventilated. An absorbent powder, such as talcum powder, may be used to keep the skin dry. Apply the powder to the affected skin in between applications of this medication. What side effects may I notice from receiving this  medication? Side effects that you should report to your care team as soon as possible: Allergic reactions--skin rash, itching, hives, swelling of the face, lips, tongue, or throat Burning, itching, crusting, or peeling of treated skin Side effects that usually do not require medical attention (report to your care team if they continue or are bothersome): Mild skin irritation, redness, or dryness This list may not describe all possible side effects. Call your doctor for medical advice about side effects. You may report side effects to FDA at 1-800-FDA-1088. Where should I keep my medication? Keep out of the reach of children and pets. Store between 2 and 30 degrees C (36 and 86 degrees F). Do not freeze. Get rid of any unused medication after the expiration date. To get rid of medications that are no longer needed or have expired: Take the medication to a medication take-back program. Check with your pharmacy or law enforcement to find a location. If you cannot return the medication, check the label or package insert to see if the medication should be thrown out in the garbage or flushed down the toilet. If you are not sure, ask your care team. If it is safe to put it in the trash, empty the medication out of the container. Mix the medication with cat litter, dirt, coffee grounds, or other unwanted substance. Seal the mixture in a bag or container. Put it in the trash. NOTE: This sheet is a summary. It may not cover all possible information. If you have questions about this medicine, talk to your doctor, pharmacist, or health care provider.  2024 Elsevier/Gold Standard (2021-10-30 00:00:00)

## 2023-03-22 ENCOUNTER — Encounter: Payer: Self-pay | Admitting: Orthopedic Surgery

## 2023-03-22 NOTE — Progress Notes (Signed)
Office Visit Note   Patient: Andrea Montes           Date of Birth: 1956/04/28           MRN: [id number] Visit Date: 03/20/2023 Requested by: Renford Dills, MD [address] [place],  [place] [address] PCP: Renford Dills, MD  Subjective: Chief Complaint  Patient presents with   Left Knee - Pain    HPI: Andrea Montes is a 67 y.o. female who presents to the office reporting bilateral knee pain left worse than right.  She did have a fall at the end of June.  Radiographs do not demonstrate any fracture but it does show some arthritis.  She had an injection in the left knee 02/18/2023 with relief for 1 week only.  Reports worsening pain.  Ambulating with a limp.  Reports popping and grinding as well as pain underneath the patella.  Describes a lot of swelling..                ROS: All systems reviewed are negative as they relate to the chief complaint within the history of present illness.  Patient denies fevers or chills.  Assessment & Plan: Visit Diagnoses:  1. Bilateral primary osteoarthritis of knee     Plan: Impression is exacerbation of existing knee arthritis.  She does have a loose body in the suprapatellar pouch on that left-hand side but she is not really reporting mechanical symptoms.  Arthritis is mild at best and joint line but a little worse in the patellofemoral region.  Gel injection indicated for refractory patellofemoral arthritis in both knees.  This patient is diagnosed with osteoarthritis of the knee(s).    Radiographs show evidence of joint space narrowing, osteophytes, subchondral sclerosis and/or subchondral cysts.  This patient has knee pain which interferes with functional and activities of daily living.    This patient has experienced inadequate response, adverse effects and/or intolerance with conservative treatments such as acetaminophen, NSAIDS, topical creams, physical therapy or regular exercise, knee bracing and/or weight loss.   This  patient has experienced inadequate response or has a contraindication to intra articular steroid injections for at least 3 months.   This patient is not scheduled to have a total knee replacement within 6 months of starting treatment with viscosupplementation.   Follow-Up Instructions: No follow-ups on file.   Orders:  No orders of the defined types were placed in this encounter.  No orders of the defined types were placed in this encounter.     Procedures: No procedures performed   Clinical Data: No additional findings.  Objective: Vital Signs: There were no vitals taken for this visit.  Physical Exam:  Constitutional: Patient appears well-developed HEENT:  Head: Normocephalic Eyes:EOM are normal Neck: Normal range of motion Cardiovascular: Normal rate Pulmonary/chest: Effort normal Neurologic: Patient is alert Skin: Skin is warm Psychiatric: Patient has normal mood and affect  Ortho Exam: Ortho exam demonstrates no effusion in either knee.  Patellofemoral crepitus is present worse on the left-hand side.  Collateral crucial ligaments are stable.  Macular tenderness present on the left.  No groin pain with internal/external rotation on that left-hand side.  No masses lymphadenopathy or skin changes noted in the knee region.  Specialty Comments:  No specialty comments available.  Imaging: No results found.   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 10/17/2006   Carpal tunnel syndrome, right upper limb 10/17/2006   Past Medical History:  Diagnosis Date   Anemia    no current med.   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   GERD (gastroesophageal reflux disease)    Heart murmur    last  echo- 2010   History of blood transfusion    History of shingles    Hyperlipidemia    Hypertension    borderline not on medicaiton at this time   Impingement syndrome of left shoulder region 10/2014   Joint pain    Migraines    Obesity    Rheumatoid arteritis (HCC)    Seasonal allergies    Sleep apnea    uses CPAP nightly   Trigger finger, left middle finger    Vitamin D deficiency    Wears partial dentures    upper    Family History  Problem Relation Age of Onset   Kidney disease Brother    Hypertension Brother    Leukemia Mother    Heart disease Mother    Stroke Mother    Cancer Mother    Depression Mother    Anxiety disorder Mother    AAA (abdominal aortic aneurysm) Father    Esophageal cancer Maternal Uncle    Hypertension Brother    Colon cancer Neg Hx    Colon polyps Neg Hx    Rectal cancer Neg Hx    Stomach cancer Neg Hx     Past Surgical History:  Procedure Laterality Date   CARPAL TUNNEL RELEASE Left 04/25/2006   CARPAL TUNNEL RELEASE Right 07/15/2018   Procedure:  RIGHT CARPAL TUNNEL RELEASE;  Surgeon: Cammy Copa, MD;  Location: [facility] OR;  Service: Orthopedics;  Laterality: Right;   CLOSED MANIPULATION SHOULDER WITH STERIOD INJECTION Left 02/03/2015   Procedure: LEFT  MANIPULATION SHOULDER UNDER ANESTHESIA WITH INJECTION OF STEROID;  Surgeon: Mckinley Jewel, MD;  Location: [facility];  Service: Orthopedics;  Laterality: Left;   COLONOSCOPY     COLONOSCOPY WITH PROPOFOL  10/09/2011   POLYPECTOMY     RESECTION DISTAL CLAVICAL Left 11/04/2014   Procedure: RESECTION DISTAL CLAVICAL;  Surgeon: Mckinley Jewel, MD;  Location: [facility];  Service: Orthopedics;  Laterality: Left;   ROTATOR CUFF REPAIR  2010   SHOULDER ARTHROSCOPY WITH ROTATOR CUFF REPAIR Right 10/04/2008  SHOULDER ARTHROSCOPY WITH SUBACROMIAL DECOMPRESSION, ROTATOR CUFF REPAIR AND BICEP TENDON REPAIR Left 11/04/2014   Procedure: LEFT SHOULDER ARTHROSCOPY WITH DEBRIDEMENT, DISTAL CLAVICLE EXCISION, ACROMIOPLASTY, ROTATOR CUFF REPAIR AND BICEP TENODESIS;  Surgeon: Mckinley Jewel, MD;  Location: [facility];  Service: Orthopedics;  Laterality: Left;   TRIGGER FINGER RELEASE Right 02/03/2015   Procedure: RIGHT LONG FINGER TRIGGER RELEASE;  Surgeon: Mckinley Jewel, MD;  Location: [facility];  Service: Orthopedics;  Laterality: Right;   TRIGGER FINGER RELEASE Left 10/10/2017   Procedure: LEFT HAND RELEASE TRIGGER FINGER 3RD LONG FINGER AND CYST EXCISION;  Surgeon: Cammy Copa, MD;  Location: [facility];  Service: Orthopedics;  Laterality: Left;   TRIGGER FINGER RELEASE Right 02/16/2021   Procedure: TRIGGER FINGER RELEASE RIGHT RING FINGER;  Surgeon: Cammy Copa, MD;  Location: [facility] OR;  Service: Orthopedics;  Laterality: Right;   TUBAL LIGATION     VAGINAL HYSTERECTOMY  2001   partial   Social History   Occupational History   Not on file  Tobacco Use   Smoking status: Never   Smokeless tobacco: Never  Vaping Use   Vaping status:  Never Used  Substance and Sexual Activity   Alcohol use: Yes    Comment: occ with a special occ   Drug use: No   Sexual activity: Not on file

## 2023-03-25 NOTE — Progress Notes (Signed)
  Subjective:  Patient ID: Andrea Montes, female    DOB: 03-Oct-1955,  MRN: [id number]  Andrea Montes presents to clinic today for: at risk foot care with history of diabetic neuropathy and follow up s/p removal of FBO left heel. Patient states left heel feels much better. She is able to bear weight. Area healing fine.  Chief Complaint  Patient presents with   Nail Problem    DFC,Referring Provider Renford Dills, MD,lov: 6 months ago,A1C::5.9,BS:110       PCP is Renford Dills, MD.  Allergies  Allergen Reactions   Lisinopril Other (See Comments), Shortness Of Breath and Cough    wheezing  Other Reaction(s): SOB/Wheezing   Kiwi Extract Itching and Other (See Comments)    Itching in mouth with KIWI, And pineapple juice- topically irritating    Pineapple Flavor Itching    Other reaction(s): Unknown    Review of Systems: Negative except as noted in the HPI.  Objective: No changes noted in today's physical examination. There were no vitals filed for this visit.  Andrea Montes is a pleasant 67 y.o. female in NAD. AAO x 3.  Vascular Examination: Capillary refill time <3 seconds b/l LE. Palpable pedal pulses b/l LE. Digital hair present b/l. No pedal edema b/l. Skin temperature gradient WNL b/l. No varicosities b/l. No cyanosis or clubbing b/l.  Dermatological Examination: Pedal skin with normal turgor, texture and tone b/l. No open wounds. No interdigital macerations b/l. Toenails 1-5 b/l thickened, discolored, dystrophic with subungual debris. There is pain on palpation to dorsal aspect of nailplates. Area plantarlateral heel pad left foot healing with good epithelialization. No surrounding erythema, no edema, no drainage, no fluctuance. Diffuse scaling noted peripherally and plantarly b/l feet.  No interdigital macerations.  No blisters, no weeping. No signs of secondary bacterial infection noted.  Neurological Examination: Protective sensation intact with 10 gram  monofilament b/l LE. Vibratory sensation intact b/l LE. Pt has subjective symptoms of neuropathy.  Musculoskeletal Examination: Normal muscle strength 5/5 to all lower extremity muscle groups bilaterally. Pes planus deformity noted bilateral LE.[name] No pain, crepitus or joint limitation noted with ROM b/l LE.  Patient ambulates independently without assistive aids.     Latest Ref Rng & Units 09/21/2022   12:50 PM  Hemoglobin A1C  Hemoglobin-A1c 4.8 - 5.6 % 5.9    Assessment/Plan: 1. Pain due to onychomycosis of toenails of both feet   2. Tinea pedis of both feet   3. Foreign body in left foot, initial encounter   4. Diabetic peripheral neuropathy associated with type 2 diabetes mellitus (HCC)     -Consent given for treatment as described below: -Examined patient. -Left heel is healing as expected. Continue Mupirocin Cream to left heel once daily until healed. Continue doxycycline until all gone. -Continue foot and shoe inspections daily. Monitor blood glucose per PCP/Endocrinologist's recommendations. -Patient to continue soft, supportive shoe gear daily. -Toenails 1-5 b/l were debrided in length and girth with sterile nail nippers and dremel without iatrogenic bleeding.  -Discontinue Ketoconazole Cream; start Clotrimazole Cream twice daily for 6 weeks. -Patient/POA to call should there be question/concern in the interim.   Return in about 9 weeks (around 05/23/2023).  Freddie Breech, DPM

## 2023-03-28 ENCOUNTER — Other Ambulatory Visit: Payer: Self-pay

## 2023-03-28 DIAGNOSIS — M170 Bilateral primary osteoarthritis of knee: Secondary | ICD-10-CM

## 2023-03-29 ENCOUNTER — Other Ambulatory Visit (HOSPITAL_COMMUNITY): Payer: Self-pay

## 2023-04-10 ENCOUNTER — Telehealth: Payer: Self-pay

## 2023-04-10 ENCOUNTER — Encounter: Payer: Self-pay | Admitting: Orthopedic Surgery

## 2023-04-10 ENCOUNTER — Ambulatory Visit (INDEPENDENT_AMBULATORY_CARE_PROVIDER_SITE_OTHER): Payer: HMO | Admitting: Surgical

## 2023-04-10 DIAGNOSIS — M1712 Unilateral primary osteoarthritis, left knee: Secondary | ICD-10-CM | POA: Diagnosis not present

## 2023-04-10 DIAGNOSIS — M170 Bilateral primary osteoarthritis of knee: Secondary | ICD-10-CM

## 2023-04-10 MED ORDER — LIDOCAINE HCL 1 % IJ SOLN
5.0000 mL | INTRAMUSCULAR | Status: AC | PRN
  Administered 2023-04-10: 5 mL

## 2023-04-10 MED ORDER — HYALURONAN 88 MG/4ML IX SOSY
88.0000 mg | PREFILLED_SYRINGE | INTRA_ARTICULAR | Status: AC | PRN
  Administered 2023-04-10: 88 mg via INTRA_ARTICULAR

## 2023-04-10 NOTE — Telephone Encounter (Signed)
Auth needed for right knee gel  

## 2023-04-10 NOTE — Progress Notes (Signed)
   Procedure Note  Patient: Andrea Montes             Date of Birth: Apr 18, 1956           MRN: [id number]             Visit Date: 04/10/2023  Procedures: Visit Diagnoses:  1. Bilateral primary osteoarthritis of knee     Large Joint Inj: L knee on 04/10/2023 11:22 AM Indications: pain, joint swelling and diagnostic evaluation Details: 18 G 1.5 in needle, superolateral approach  Arthrogram: No  Medications: 5 mL lidocaine 1 %; 88 mg Hyaluronan 88 MG/4ML Outcome: tolerated well, no immediate complications  Patient here today for left knee gel injection.  She also has history of right knee arthritis and she would like to try right knee gel injection.  Plan to preapproved this for her.  This patient is diagnosed with osteoarthritis of the knee(s).    Radiographs show evidence of joint space narrowing, osteophytes, subchondral sclerosis and/or subchondral cysts.  This patient has knee pain which interferes with functional and activities of daily living.    This patient has experienced inadequate response, adverse effects and/or intolerance with conservative treatments such as acetaminophen, NSAIDS, topical creams, physical therapy or regular exercise, knee bracing and/or weight loss.   This patient has experienced inadequate response or has a contraindication to intra articular steroid injections for at least 3 months.   This patient is not scheduled to have a total knee replacement within 6 months of starting treatment with viscosupplementation.   Procedure, treatment alternatives, risks and benefits explained, specific risks discussed. Consent was given by the patient. Immediately prior to procedure a time out was called to verify the correct patient, procedure, equipment, support staff and site/side marked as required. Patient was prepped and draped in the usual sterile fashion.

## 2023-04-11 NOTE — Telephone Encounter (Signed)
VOB submitted for Monovisc, right knee  

## 2023-04-21 ENCOUNTER — Emergency Department (HOSPITAL_BASED_OUTPATIENT_CLINIC_OR_DEPARTMENT_OTHER)
Admission: EM | Admit: 2023-04-21 | Discharge: 2023-04-21 | Disposition: A | Discharge status: Home / Self Care | Payer: HMO | Source: Home / Self Care | Attending: Emergency Medicine | Admitting: Emergency Medicine

## 2023-04-21 ENCOUNTER — Other Ambulatory Visit: Payer: Self-pay

## 2023-04-21 DIAGNOSIS — L509 Urticaria, unspecified: Secondary | ICD-10-CM | POA: Insufficient documentation

## 2023-04-21 DIAGNOSIS — R21 Rash and other nonspecific skin eruption: Secondary | ICD-10-CM | POA: Diagnosis not present

## 2023-04-21 MED ORDER — PREDNISONE 10 MG PO TABS: 20.0000 mg | ORAL_TABLET | Freq: Every day | ORAL | 0 refills | Status: AC

## 2023-04-21 NOTE — ED Provider Notes (Signed)
Andrea Montes   CSN: [id number] Arrival date & time: 04/21/23  1530     History  Chief Complaint  Patient presents with   Urticaria    Andrea Montes is a 67 y.o. female.  Patient here with rash on her legs.  Started maybe after eating some [place] food yesterday.  However was also outside a few days ago and could be bug bites.  She denies any difficulty breathing.  Has allergies to pineapples and kiwis.  No new medications otherwise.  No nausea vomiting diarrhea.  Rash started today.  The history is provided by the patient.       Home Medications Prior to Admission medications   Medication Sig Start Date End Date Taking? Authorizing Provider  predniSONE (DELTASONE) 10 MG tablet Take 2 tablets (20 mg total) by mouth daily for 3 days. 04/21/23 04/24/23 Yes Shonta Phillis, DO  acyclovir (ZOVIRAX) 400 MG tablet Take 1 tablet (400 mg total) by mouth 4 (four) times daily. 08/20/21   Fayrene Helper, PA-C  Aspirin-Acetaminophen-Caffeine (GOODY HEADACHE PO) Take 1 Package by mouth as needed (pain).    [provider]  atorvastatin (LIPITOR) 10 MG tablet TAKE 1 TABLET (10 MG TOTAL) BY MOUTH AT BEDTIME. 10/27/20 01/01/22  Wendall Stade, MD  blood glucose meter kit and supplies Check blood sugars as directed 11/27/22   Renford Dills, MD  Blood Glucose Monitoring Suppl (FREESTYLE LITE) w/Device KIT 1 kit. Use as directed 11/22/22     Blood Pressure Monitoring (OMRON 3 SERIES BP MONITOR) DEVI  10/05/19   [provider]  calcium carbonate (TUMS EX) 750 MG chewable tablet Chew 1-2 tablets by mouth 2 (two) times daily as needed for heartburn.    [provider]  cephALEXin (KEFLEX) 500 MG capsule Take 1 capsule (500 mg total) by mouth 4 (four) times daily for 7 days 12/11/22     Cholecalciferol (VITAMIN D3) 50 MCG (2000 UT) TABS Take 2,000 Units by mouth every Friday.    [provider]  clotrimazole-betamethasone  (LOTRISONE) cream Apply 1 Application topically daily. 04/03/22   Louann Sjogren, DPM  Continuous Blood Gluc Sensor (FREESTYLE LIBRE 3 SENSOR) MISC Apply to upper back or arm change every 14 (fourteen) days. 11/29/21   Renford Dills, MD  COVID-19 At Home Antigen Test [facility] COVID-19 HOME TEST) KIT Use as directed 07/12/21   Driscilla Grammes, [facility]  empagliflozin (JARDIANCE) 25 MG TABS tablet Take 1 tablet (25 mg total) by mouth daily. 06/23/20   Renford Dills, MD  empagliflozin (JARDIANCE) 25 MG TABS tablet Take 1 tablet (25 mg total) by mouth daily. 05/14/22     fluconazole (DIFLUCAN) 150 MG tablet Take 1 tablet (150 mg total) by mouth on day 1 and 1 tablet on day 3 as directed 12/11/22     FREESTYLE LITE test strip  12/17/19   [provider]  furosemide (LASIX) 20 MG tablet Take 1 tablet (20 mg total) by mouth daily. 03/06/21     furosemide (LASIX) 20 MG tablet Take 1 tablet by mouth once daily 02/01/23     glucose blood test strip Check blood sugars as directed. 11/27/22   Renford Dills, MD  Insulin Pen Needle 31G X 5 MM MISC Use once daily as directed 10/04/21     Lancets (FREESTYLE) lancets 1 each 3 (three) times daily. 12/17/19   [provider]  losartan (COZAAR) 50 MG tablet Take 1 tablet (50 mg total) by mouth  daily. 05/14/22     meloxicam (MOBIC) 15 MG tablet Take 1 tablet (15 mg total) by mouth daily as needed for pain. 05/02/17   Ralene Cork, DO  meloxicam (MOBIC) 15 MG tablet Take 1 tablet by mouth once daily 11/03/21     metFORMIN (GLUCOPHAGE-XR) 750 MG 24 hr tablet Take 2 tablets (1,500 mg total) by mouth daily with food 05/14/22     methocarbamol (ROBAXIN) 750 MG tablet Take 1 tablet (750 mg total) by mouth every 8 (eight) hours as needed. 03/14/22     Multiple Vitamin (MULTIVITAMIN WITH MINERALS) TABS tablet Take 1 tablet by mouth every other day. In the morning    [provider]  mupirocin ointment (BACTROBAN) 2 % Apply 1 Application topically 2 (two) times  daily. 12/07/22   Felecia Shelling, DPM  phenazopyridine (PYRIDIUM) 200 MG tablet Take 1 tablet (200 mg total) by mouth 3 (three) times daily after meals. 12/11/22     Semaglutide, 2 MG/DOSE, (OZEMPIC, 2 MG/DOSE,) 8 MG/3ML SOPN Inject 2 mg into the skin once a week. 04/05/22     SUMAtriptan (IMITREX) 25 MG tablet TAKE 1 TABLET BY MOUTH AT ONSET OF MIGRAINE, MAY REPEAT IN 1 HR IF NEEDED 03/29/20 10/09/21  Renford Dills, MD  traMADol (ULTRAM) 50 MG tablet Take 1 tablet (50 mg total) by mouth every 6 (six) hours as needed. 02/16/21   Magnant, Charles L, PA-C  triamcinolone ointment (KENALOG) 0.1 % Apply to affected foot twice daily. 11/22/22   Freddie Breech, DPM  TRUEplus Lancets 30G MISC Use as directed 11/27/22         Allergies    Lisinopril, Kiwi extract, and Pineapple flavor    Review of Systems   Review of Systems  Physical Exam Updated Vital Signs BP (!) 156/91 (BP Location: Right Arm)   Pulse 83   Temp 98.5 F (36.9 C) (Oral)   Resp 19   Ht 5\' 3"  (1.6 m)   Wt 117.9 kg   SpO2 96%   BMI 46.06 kg/m  Physical Exam Vitals and nursing Montes reviewed.  Constitutional:      General: She is not in acute distress.    Appearance: She is well-developed.  HENT:     Head: Normocephalic and atraumatic.  Eyes:     Conjunctiva/sclera: Conjunctivae normal.  Cardiovascular:     Rate and Rhythm: Normal rate and regular rhythm.     Heart sounds: No murmur heard. Pulmonary:     Effort: Pulmonary effort is normal. No respiratory distress.     Breath sounds: Normal breath sounds.  Abdominal:     Palpations: Abdomen is soft.     Tenderness: There is no abdominal tenderness.  Musculoskeletal:        General: No swelling.     Cervical back: Neck supple.  Skin:    General: Skin is warm and dry.     Capillary Refill: Capillary refill takes less than 2 seconds.     Findings: Rash present.     Comments: Small hive/bug bites to both legs bilaterally some on the right arm  Neurological:      Mental Status: She is alert.  Psychiatric:        Mood and Affect: Mood normal.     ED Results / Procedures / Treatments   Labs (all labs ordered are listed, but only abnormal results are displayed) Labs Reviewed - No data to display  EKG None  Radiology No results found.  Procedures Procedures  Medications Ordered in ED Medications - No data to display  ED Course/ Medical Decision Making/ A&P                                 Medical Decision Making Risk Prescription drug management.   Andrea Montes is here with rash.  No significant medical history except for diabetes.  Well-controlled diabetes.  Unremarkable vitals.  Shared decision making to treat for what I suspect this may be a contact dermatitis or bug bites or may be mild allergy on her legs with prednisone and Benadryl.  She states her last A1c was good.  I think she will tolerate prednisone for few days.  Overall this is a nonthreatening looking rash.  It looks like it some sort of contact dermatitis of bug bite/mosquito bites.  Although she states that rash seemed to happen after eating some [place] food.  She has no signs of anaphylaxis.  She understands return precautions.  Discharged in good condition.  This chart was dictated using voice recognition software.  Despite best efforts to proofread,  errors can occur which can change the documentation meaning.         Final Clinical Impression(s) / ED Diagnoses Final diagnoses:  Rash    Rx / DC Orders ED Discharge Orders          Ordered    predniSONE (DELTASONE) 10 MG tablet  Daily        04/21/23 1559              Virgina Norfolk, DO 04/21/23 1601

## 2023-04-21 NOTE — Discharge Instructions (Signed)
Take prednisone as prescribed.  Recommend 25 mg of Benadryl every 8 hours as needed for itching as well.  Follow-up with your primary care doctor.  Return if symptoms worsen.

## 2023-04-21 NOTE — ED Notes (Signed)
Pt discharged to home using teachback Method. Discharge instructions have been discussed with patient and/or family members. Pt verbally acknowledges understanding d/c instructions, has been given opportunity for questions to be answered, and endorses comprehension to checkout at registration before leaving.  

## 2023-04-21 NOTE — ED Triage Notes (Signed)
Pt to ED c/o hives to lower legs and right arm. Reports started last night. Reports itching. No SHOB, airway intact. Denies change in soap, laundry detergent.

## 2023-04-23 ENCOUNTER — Other Ambulatory Visit (HOSPITAL_COMMUNITY): Payer: Self-pay

## 2023-04-25 ENCOUNTER — Other Ambulatory Visit (HOSPITAL_COMMUNITY): Payer: Self-pay

## 2023-04-28 ENCOUNTER — Encounter: Payer: Self-pay | Admitting: Podiatry

## 2023-04-29 ENCOUNTER — Other Ambulatory Visit (HOSPITAL_COMMUNITY): Payer: Self-pay

## 2023-04-29 MED ORDER — MELOXICAM 15 MG PO TABS
15.0000 mg | ORAL_TABLET | Freq: Every day | ORAL | 3 refills | Status: DC
  Filled 2023-04-29: qty 90, 90d supply, fill #0
  Filled 2023-08-21: qty 90, 90d supply, fill #1
  Filled 2024-03-31: qty 90, 90d supply, fill #2

## 2023-05-02 ENCOUNTER — Other Ambulatory Visit (INDEPENDENT_AMBULATORY_CARE_PROVIDER_SITE_OTHER): Payer: HMO | Admitting: Podiatry

## 2023-05-02 ENCOUNTER — Other Ambulatory Visit (HOSPITAL_COMMUNITY): Payer: Self-pay

## 2023-05-02 ENCOUNTER — Encounter: Payer: Self-pay | Admitting: Podiatry

## 2023-05-02 DIAGNOSIS — B353 Tinea pedis: Secondary | ICD-10-CM

## 2023-05-02 MED ORDER — CLOTRIMAZOLE-BETAMETHASONE 1-0.05 % EX CREA
TOPICAL_CREAM | Freq: Two times a day (BID) | CUTANEOUS | 0 refills | Status: DC
  Filled 2023-05-02: qty 45, 30d supply, fill #0

## 2023-05-02 NOTE — Progress Notes (Signed)
1. Tinea pedis of both feet    Meds ordered this encounter  Medications   clotrimazole-betamethasone (LOTRISONE) cream    Sig: Apply to both feet and between toes bid x 6 weeks.    Dispense:  45 g    Refill:  0   Freddie Breech, DPM

## 2023-05-08 IMAGING — XA DG FLUORO GUIDE NDL PLC/BX
2 series · 2 of 2 positions shown · non-contrast
Comparison: none

CLINICAL DATA: Pain and limited range of motion. History of rotator
cuff repair.

[Series 1: ortho standard · 1 of 1 slices shown (1 of 2)]
[im 1/1]
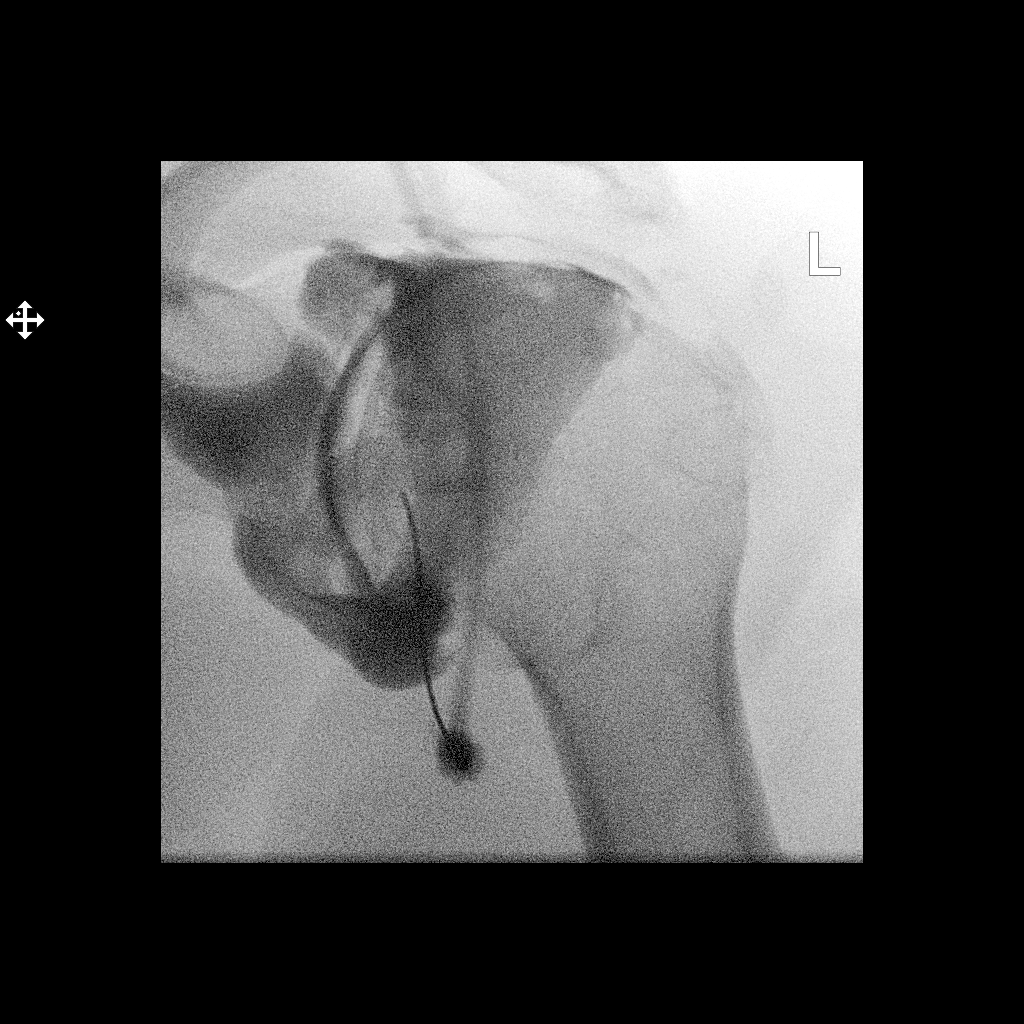

[Series 2: ortho standard · 1 of 1 slices shown (2 of 2)]
[im 1/1]
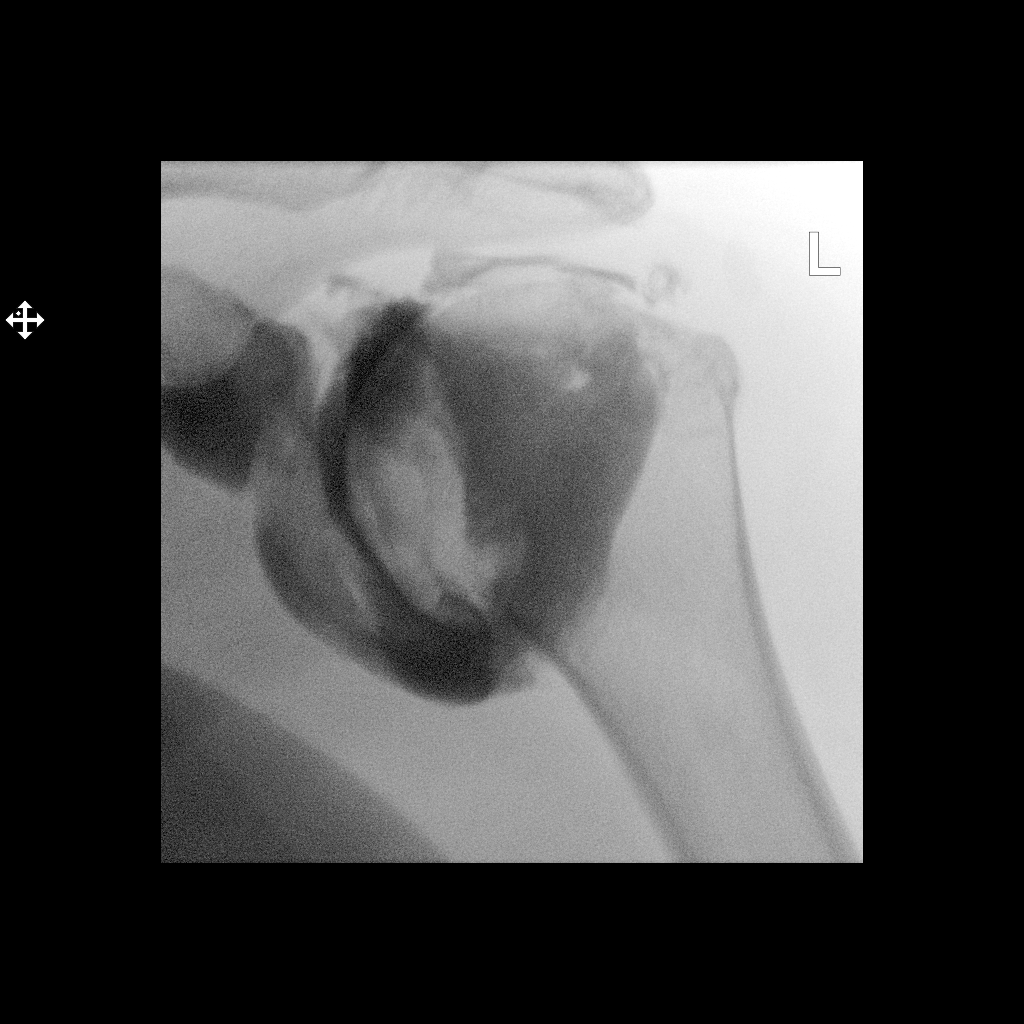

[2 of 2 positions shown; findings below may reference images not displayed]

FLUOROSCOPY TIME:  1 minutes 19 seconds. 55.40 micro gray meter
squared

PROCEDURE:
Left SHOULDER INJECTION UNDER FLUOROSCOPY

The skin overlying the shoulder was scrubbed with Betadine and
draped in sterile fashion. Skin and subcutaneous anesthesia was
carried out using a 25 gauge needle and 1% lidocaine. A 5 inch 22
gauge spinal needle was directed under fluoroscopic guidance on one
pass into the glenohumeral joint. 20 cc of a mixture of 0.1 cc
MultiHance and dilute Isovue 200 was then used to fill the
glenohumeral joint.
IMPRESSION: Technically successful left shoulder injection for MRI. Limited
filming shows full-thickness cuff tear with contrast extending into
the subacromial subdeltoid bursa.

## 2023-05-08 IMAGING — MR MR SHOULDER*L* W/ CM
5 series · 40 of 40 positions shown · IV contrast (agent unspecified)
Comparison: X-ray 03/06/2021, MRI 10/16/2014

CLINICAL DATA: MRI arthrogram of the left shoulder on this patient
to evaluate for rotator cuff tear versus labral tear versus occult
arthritis. History of rotator cuff surgery in 4311

EXAM:
MR ARTHROGRAM OF THE LEFT SHOULDER
TECHNIQUE: Multiplanar, multisequence MR imaging of the left shoulder was
performed following the administration of intra-articular contrast.
CONTRAST:  See Injection Documentation.

[Series 3: T1 fat-sat · axial · 4.0mm · 0.27mm/px · z∈[-79,-3]mm · 8 of 18 slices shown (1 of 3)]
[im 1/18]
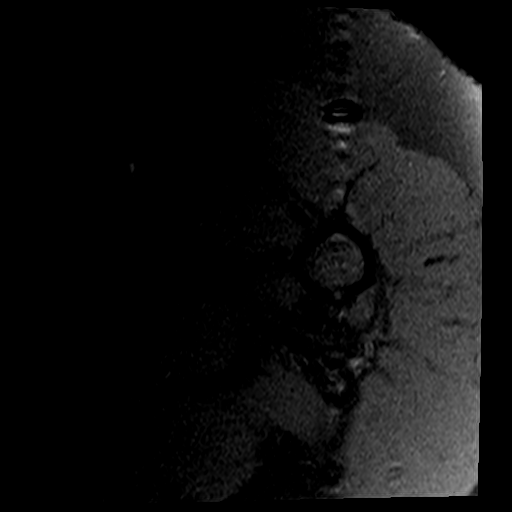
[im 3/18]
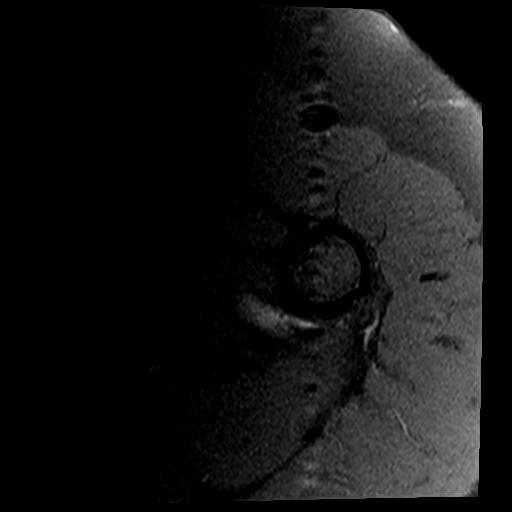
[im 5/18]
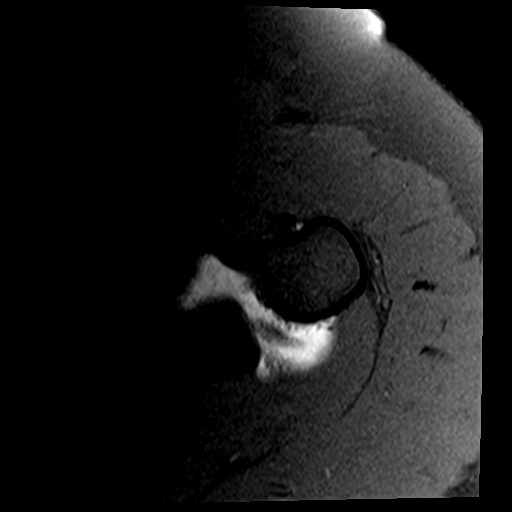
[im 8/18]
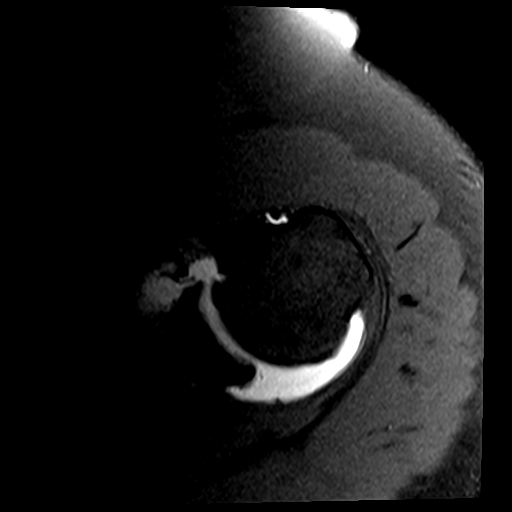
[im 10/18]
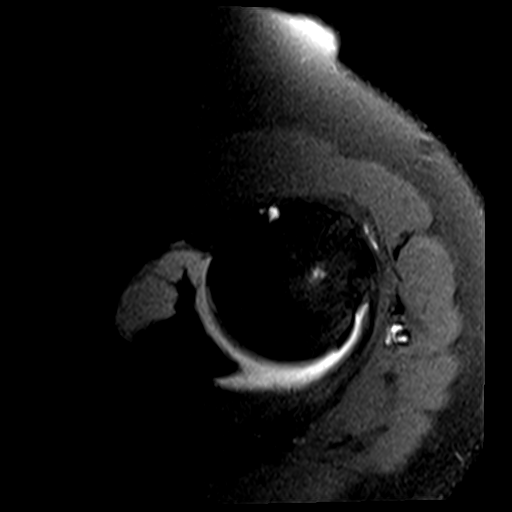
[im 13/18]
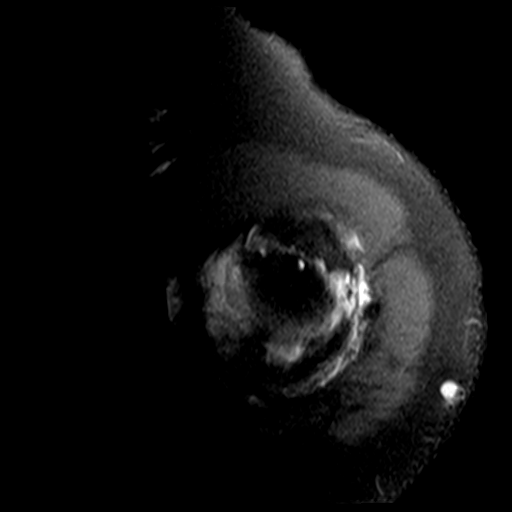
[im 15/18]
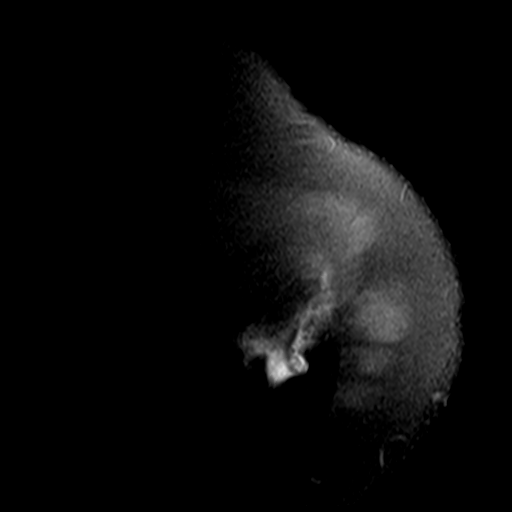
[im 18/18]
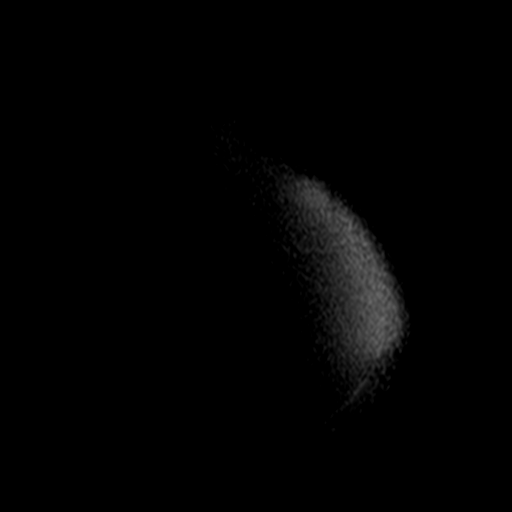

[Series 4: T2 fat-sat · oblique · 4.0mm · 0.55mm/px · 8 of 18 slices shown (1 of 2)]
[im 1/18]
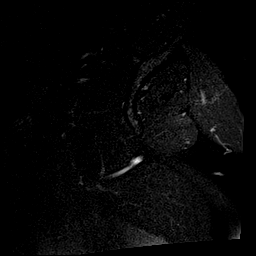
[im 3/18]
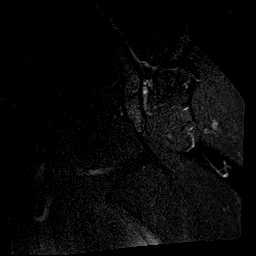
[im 5/18]
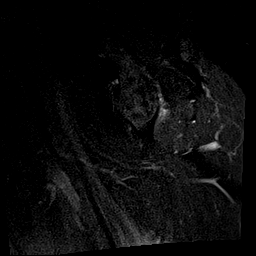
[im 8/18]
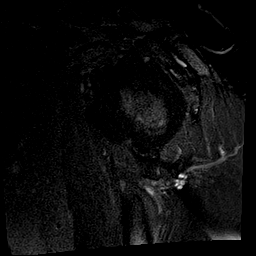
[im 10/18]
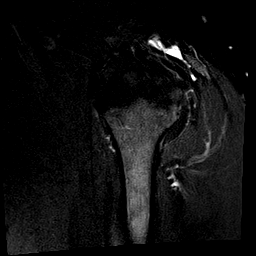
[im 13/18]
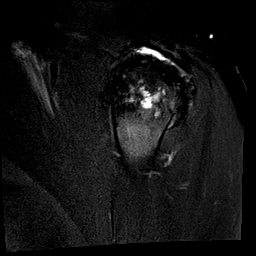
[im 15/18]
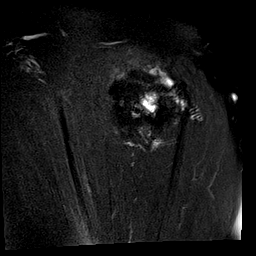
[im 18/18]
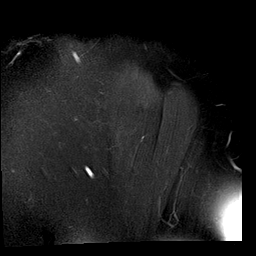

[Series 9: T1 fat-sat · oblique · 4.0mm · 0.55mm/px · 8 of 16 slices shown (2 of 3)]
[im 1/16]
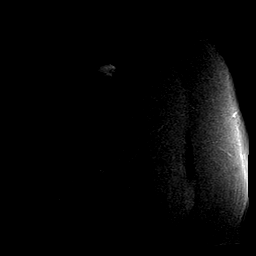
[im 3/16]
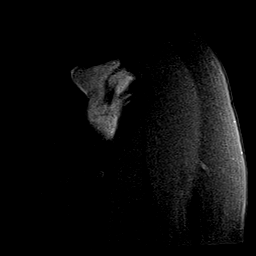
[im 5/16]
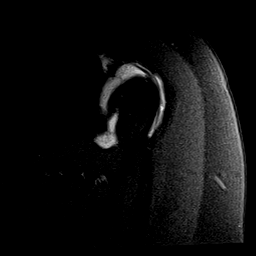
[im 7/16]
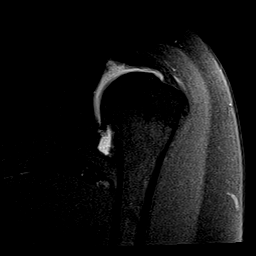
[im 9/16]
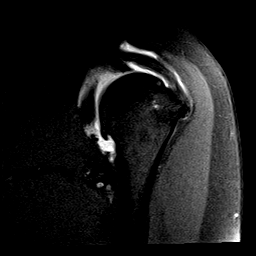
[im 11/16]
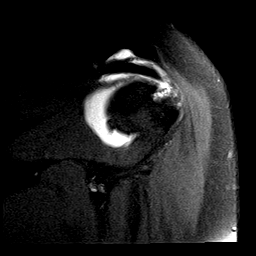
[im 13/16]
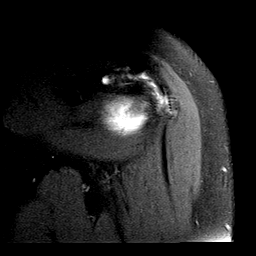
[im 16/16]
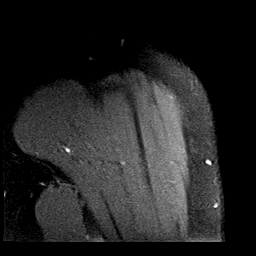

[Series 10: T2 fat-sat · oblique · 4.0mm · 0.55mm/px · 8 of 16 slices shown (2 of 2)]
[im 1/16]
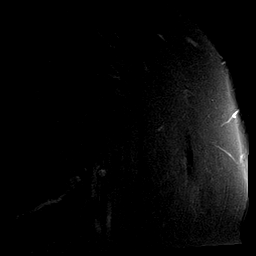
[im 3/16]
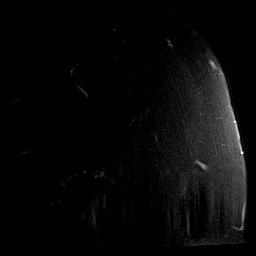
[im 5/16]
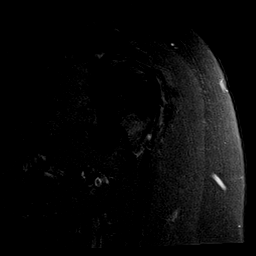
[im 7/16]
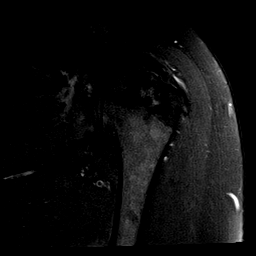
[im 9/16]
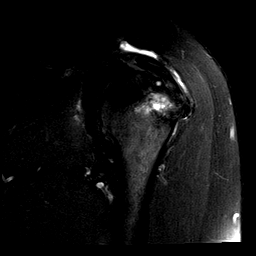
[im 11/16]
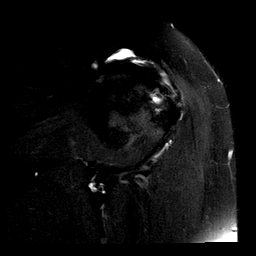
[im 13/16]
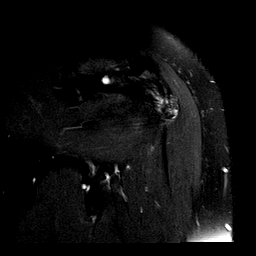
[im 16/16]
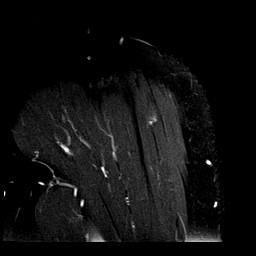

[Series 11: T1 fat-sat · oblique · 4.0mm · 0.55mm/px · 8 of 16 slices shown (3 of 3)]
[im 1/16]
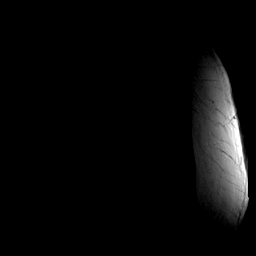
[im 3/16]
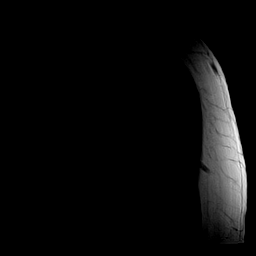
[im 5/16]
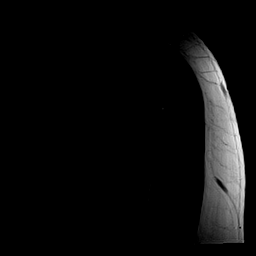
[im 7/16]
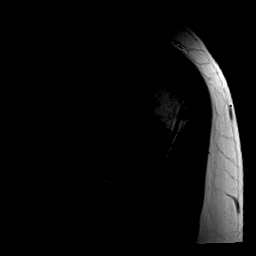
[im 9/16]
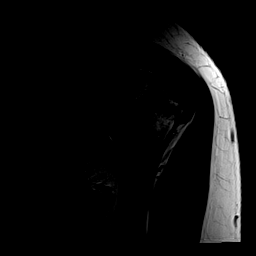
[im 11/16]
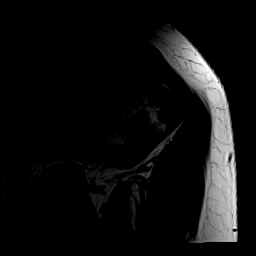
[im 13/16]
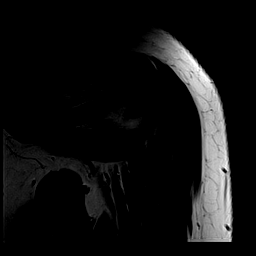
[im 16/16]
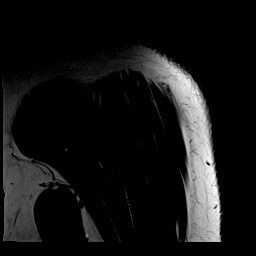

[40 of 40 positions shown; findings below may reference images not displayed]

FINDINGS: Technical note: Despite efforts by the technologist and patient,
motion artifact is present on today's exam and could not be
eliminated. This reduces exam sensitivity and specificity.

Rotator cuff: Focal full-thickness tear of the posterior
infraspinatus tendon insertion (series 9, image 12).
Partial-thickness articular surface tear of the mid supraspinatus
tendon in the region of the critical zone involving between 25-50%
of the tendon depth (series 9, images 8-9). Subscapularis tendon
appears grossly intact. Intact teres minor.

Muscles: No rotator cuff muscle atrophy or fatty infiltration.

Biceps long head: Intra-articular portion of the long head biceps
tendon is not well seen.

Acromioclavicular Joint: Postsurgical changes to the AC joint with
distal clavicular resection. Small volume fluid and contrast is
present within the subacromial-subdeltoid bursal space.

Glenohumeral Joint: Adequately distended with injected contrast.
Diffuse chondral thinning. Prominent humeral head marginal
osteophytes.

Labrum: Superior and posterior labrum are diminutive and blunted in
appearance which may reflect degeneration or postsurgical change. No
paralabral cyst.

Bones: Subcortical cystic change within the posterior aspect of the
greater tuberosity. No acute fracture. No dislocation. No suspicious
bone lesion.
IMPRESSION: 1. Small focal full-thickness tear of the posterior infraspinatus
tendon insertion.
2. Partial-thickness articular surface tear of the mid supraspinatus
tendon in the region of the critical zone.
3. Superior and posterior labrum are diminutive and blunted in
appearance which may reflect degeneration or postsurgical change.

## 2023-06-05 ENCOUNTER — Other Ambulatory Visit (HOSPITAL_COMMUNITY): Payer: Self-pay

## 2023-06-07 DIAGNOSIS — Z6841 Body Mass Index (BMI) 40.0 and over, adult: Secondary | ICD-10-CM | POA: Diagnosis not present

## 2023-06-07 DIAGNOSIS — Z5181 Encounter for therapeutic drug level monitoring: Secondary | ICD-10-CM | POA: Diagnosis not present

## 2023-06-07 DIAGNOSIS — Z0000 Encounter for general adult medical examination without abnormal findings: Secondary | ICD-10-CM | POA: Diagnosis not present

## 2023-06-07 DIAGNOSIS — E119 Type 2 diabetes mellitus without complications: Secondary | ICD-10-CM | POA: Diagnosis not present

## 2023-06-07 DIAGNOSIS — I10 Essential (primary) hypertension: Secondary | ICD-10-CM | POA: Diagnosis not present

## 2023-06-07 DIAGNOSIS — E7800 Pure hypercholesterolemia, unspecified: Secondary | ICD-10-CM | POA: Diagnosis not present

## 2023-06-07 DIAGNOSIS — G4730 Sleep apnea, unspecified: Secondary | ICD-10-CM | POA: Diagnosis not present

## 2023-06-07 DIAGNOSIS — Z23 Encounter for immunization: Secondary | ICD-10-CM | POA: Diagnosis not present

## 2023-06-07 DIAGNOSIS — E1169 Type 2 diabetes mellitus with other specified complication: Secondary | ICD-10-CM | POA: Diagnosis not present

## 2023-06-07 DIAGNOSIS — R9431 Abnormal electrocardiogram [ECG] [EKG]: Secondary | ICD-10-CM | POA: Diagnosis not present

## 2023-06-21 ENCOUNTER — Ambulatory Visit: Payer: HMO | Admitting: Podiatry

## 2023-07-01 ENCOUNTER — Telehealth: Payer: Self-pay | Admitting: Orthopedic Surgery

## 2023-07-01 NOTE — Telephone Encounter (Signed)
Pt called requesting a call from [facility]. Pt is trying to get an appt for her shoulder . Gave pt Franky Macho and [employer] earliest appt and she declined.  Pt phone number is [phone]

## 2023-07-01 NOTE — Telephone Encounter (Signed)
Scheduled for Wednesday a.m.

## 2023-07-03 ENCOUNTER — Other Ambulatory Visit (INDEPENDENT_AMBULATORY_CARE_PROVIDER_SITE_OTHER): Payer: Self-pay

## 2023-07-03 ENCOUNTER — Ambulatory Visit (INDEPENDENT_AMBULATORY_CARE_PROVIDER_SITE_OTHER): Payer: HMO | Admitting: Orthopedic Surgery

## 2023-07-03 ENCOUNTER — Other Ambulatory Visit (INDEPENDENT_AMBULATORY_CARE_PROVIDER_SITE_OTHER): Payer: HMO

## 2023-07-03 DIAGNOSIS — M65221 Calcific tendinitis, right upper arm: Secondary | ICD-10-CM | POA: Diagnosis not present

## 2023-07-03 DIAGNOSIS — M79601 Pain in right arm: Secondary | ICD-10-CM

## 2023-07-03 DIAGNOSIS — M6520 Calcific tendinitis, unspecified site: Secondary | ICD-10-CM

## 2023-07-04 ENCOUNTER — Ambulatory Visit: Payer: HMO | Admitting: Podiatry

## 2023-07-06 ENCOUNTER — Encounter: Payer: Self-pay | Admitting: Orthopedic Surgery

## 2023-07-06 NOTE — Progress Notes (Unsigned)
Office Visit Note   Patient: Andrea Montes           Date of Birth: Mar 20, 1956           MRN: [id number] Visit Date: 07/03/2023 Requested by: Renford Dills, MD [address] [place],  [place] [address] PCP: Renford Dills, MD  Subjective: Chief Complaint  Patient presents with   Other     Right shoulder/arm/neck pain    HPI: Andrea Montes is a 67 y.o. female who presents to the office reporting acute onset of right shoulder pain 3 days prior to the clinic visit.  She has had prior rotator cuff tear repair on the right shoulder not done by myself.  She is right-hand dominant.  Reports episodic numbness and tingling in the right hand but not much in the way of definitive neck symptoms.  She does report painful and decreased range of motion.  Has tried meloxicam without relief.  Last hemoglobin A1c 6.1.[name]                ROS: All systems reviewed are negative as they relate to the chief complaint within the history of present illness.  Patient denies fevers or chills.  Assessment & Plan: Visit Diagnoses:  1. 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  Subacromial injection performed today.  Will see how she does with that intervention.  Could consider further intervention if needed.  The acute onset of her symptoms along with absence of family history of gout or pseudogout makes this calcific tendinitis the most likely culprit in terms of her acute shoulder pain.  Does not look radicular at this time.  Follow-Up Instructions: No follow-ups on file.   Orders:  Orders Placed This Encounter  Procedures   XR Shoulder Right   XR Cervical Spine 2 or 3 views   No orders of the defined types were placed in this encounter.     Procedures: Large Joint Inj: R subacromial bursa on 07/03/2023 9:49 AM Indications: diagnostic evaluation and pain Details: 18 G 1.5 in needle, posterior  approach  Arthrogram: No  Medications: 9 mL bupivacaine 0.5 %; 40 mg methylPREDNISolone acetate 40 MG/ML; 5 mL lidocaine 1 % Outcome: tolerated well, no immediate complications Procedure, treatment alternatives, risks and benefits explained, specific risks discussed. Consent was given by the patient. Immediately prior to procedure a time out was called to verify the correct patient, procedure, equipment, support staff and site/side marked as required. Patient was prepped and draped in the usual sterile fashion.       Clinical Data: No additional findings.  Objective: Vital Signs: There were no vitals taken for this visit.  Physical Exam:  Constitutional: Patient appears well-developed HEENT:  Head: Normocephalic Eyes:EOM are normal Neck: Normal range of motion Cardiovascular: Normal rate Pulmonary/chest: Effort normal Neurologic: Patient is alert Skin: Skin is warm Psychiatric: Patient has normal mood and affect  Ortho Exam: Ortho exam demonstrates good cervical spine range of motion.  Good rotator cuff strength good infraspinatus supraspinatus and subscap muscle testing.  Passive range of motion is approximately 45/80/150 bilaterally.  Does have pain with overhead motion as well as resisted abduction on the right compared to the left.  No discrete AC joint tenderness right versus left.  No coarse grinding or crepitus in the shoulder joint region with internal and external rotation of the arm.  No definite paresthesias C5-T1.  Specialty Comments:  No specialty comments available.  Imaging: No results found.   PMFS History: Patient Active Problem List   Diagnosis Date Noted   Urinary symptom or sign 03/12/2023   Hirsutism 07/20/2022   Hyperglycemia due to type 2 diabetes mellitus (HCC) 07/20/2022   Type 2 diabetes mellitus without complication, without long-term current use of insulin (HCC) 10/01/2018   Trigger finger, right ring finger 09/09/2015   Low back pain  radiating to both legs 05/06/2012   Leg length discrepancy 05/06/2012   HYPERTENSION, BENIGN 04/11/2009   LEG EDEMA, BILATERAL 10/08/2008   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 10/17/2006   Carpal tunnel syndrome, right upper limb 10/17/2006   Past Medical History:  Diagnosis Date   Anemia    no current med.   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   GERD (gastroesophageal reflux disease)    Heart murmur    last  echo- 2010   History of blood transfusion    History of shingles    Hyperlipidemia    Hypertension    borderline not on medicaiton at this time   Impingement syndrome of left shoulder region 10/2014   Joint pain    Migraines    Obesity    Rheumatoid arteritis (HCC)    Seasonal allergies    Sleep apnea    uses CPAP nightly   Trigger finger, left middle finger    Vitamin D deficiency    Wears partial dentures    upper    Family History  Problem Relation Age of Onset   Kidney disease Brother    Hypertension Brother    Leukemia Mother    Heart disease Mother    Stroke Mother    Cancer Mother    Depression Mother    Anxiety disorder Mother    AAA (abdominal aortic aneurysm) Father    Esophageal cancer Maternal Uncle    Hypertension Brother    Colon cancer Neg Hx    Colon polyps Neg Hx     Rectal cancer Neg Hx    Stomach cancer Neg Hx     Past Surgical History:  Procedure Laterality Date   CARPAL TUNNEL RELEASE Left 04/25/2006   CARPAL TUNNEL RELEASE Right 07/15/2018   Procedure: RIGHT CARPAL TUNNEL RELEASE;  Surgeon: Cammy Copa, MD;  Location: [facility];  Service: Orthopedics;  Laterality: Right;   CLOSED MANIPULATION SHOULDER WITH STERIOD INJECTION Left 02/03/2015   Procedure: LEFT  MANIPULATION SHOULDER UNDER ANESTHESIA WITH INJECTION OF STEROID;  Surgeon: Mckinley Jewel, MD;  Location: [facility];  Service: Orthopedics;  Laterality: Left;   COLONOSCOPY     COLONOSCOPY WITH PROPOFOL  10/09/2011   POLYPECTOMY     RESECTION DISTAL CLAVICAL Left 11/04/2014  Procedure: RESECTION DISTAL CLAVICAL;  Surgeon: Mckinley Jewel, MD;  Location: [facility];  Service: Orthopedics;  Laterality: Left;   ROTATOR CUFF REPAIR  2010   SHOULDER ARTHROSCOPY WITH ROTATOR CUFF REPAIR Right 10/04/2008   SHOULDER ARTHROSCOPY WITH SUBACROMIAL DECOMPRESSION, ROTATOR CUFF REPAIR AND BICEP TENDON REPAIR Left 11/04/2014   Procedure: LEFT SHOULDER ARTHROSCOPY WITH DEBRIDEMENT, DISTAL CLAVICLE EXCISION, ACROMIOPLASTY, ROTATOR CUFF REPAIR AND BICEP TENODESIS;  Surgeon: Mckinley Jewel, MD;  Location: [facility];  Service: Orthopedics;  Laterality: Left;   TRIGGER FINGER RELEASE Right 02/03/2015   Procedure: RIGHT LONG FINGER TRIGGER RELEASE;  Surgeon: Mckinley Jewel, MD;  Location: [facility];  Service: Orthopedics;  Laterality: Right;   TRIGGER FINGER RELEASE Left 10/10/2017   Procedure: LEFT HAND RELEASE TRIGGER FINGER 3RD LONG FINGER AND CYST EXCISION;  Surgeon: Cammy Copa, MD;  Location: [facility];  Service: Orthopedics;  Laterality: Left;   TRIGGER FINGER RELEASE Right 02/16/2021   Procedure: TRIGGER FINGER RELEASE RIGHT RING FINGER;  Surgeon: Cammy Copa, MD;  Location: [facility];  Service: Orthopedics;  Laterality: Right;   TUBAL LIGATION      VAGINAL HYSTERECTOMY  2001   partial   Social History   Occupational History   Not on file  Tobacco Use   Smoking status: Never   Smokeless tobacco: Never  Vaping Use   Vaping status: Never Used  Substance and Sexual Activity   Alcohol use: Yes    Comment: occ with a special occ   Drug use: No   Sexual activity: Not on file

## 2023-07-07 ENCOUNTER — Encounter: Payer: Self-pay | Admitting: Orthopedic Surgery

## 2023-07-07 MED ORDER — BUPIVACAINE HCL 0.5 % IJ SOLN
9.0000 mL | INTRAMUSCULAR | Status: AC | PRN
  Administered 2023-07-03: 9 mL via INTRA_ARTICULAR

## 2023-07-07 MED ORDER — LIDOCAINE HCL 1 % IJ SOLN
5.0000 mL | INTRAMUSCULAR | Status: AC | PRN
  Administered 2023-07-03: 5 mL

## 2023-07-07 MED ORDER — METHYLPREDNISOLONE ACETATE 40 MG/ML IJ SUSP
40.0000 mg | INTRAMUSCULAR | Status: AC | PRN
  Administered 2023-07-03: 40 mg via INTRA_ARTICULAR

## 2023-07-26 ENCOUNTER — Other Ambulatory Visit (HOSPITAL_COMMUNITY): Payer: Self-pay | Admitting: Internal Medicine

## 2023-07-26 DIAGNOSIS — R9431 Abnormal electrocardiogram [ECG] [EKG]: Secondary | ICD-10-CM

## 2023-07-29 ENCOUNTER — Ambulatory Visit (INDEPENDENT_AMBULATORY_CARE_PROVIDER_SITE_OTHER): Payer: HMO | Admitting: Podiatry

## 2023-07-29 ENCOUNTER — Encounter: Payer: Self-pay | Admitting: Podiatry

## 2023-07-29 ENCOUNTER — Other Ambulatory Visit (HOSPITAL_COMMUNITY): Payer: Self-pay

## 2023-07-29 VITALS — Ht 63.0 in | Wt 260.0 lb

## 2023-07-29 DIAGNOSIS — M2141 Flat foot [pes planus] (acquired), right foot: Secondary | ICD-10-CM | POA: Diagnosis not present

## 2023-07-29 DIAGNOSIS — M79675 Pain in left toe(s): Secondary | ICD-10-CM

## 2023-07-29 DIAGNOSIS — E1142 Type 2 diabetes mellitus with diabetic polyneuropathy: Secondary | ICD-10-CM

## 2023-07-29 DIAGNOSIS — M79674 Pain in right toe(s): Secondary | ICD-10-CM | POA: Diagnosis not present

## 2023-07-29 DIAGNOSIS — R21 Rash and other nonspecific skin eruption: Secondary | ICD-10-CM | POA: Diagnosis not present

## 2023-07-29 DIAGNOSIS — B351 Tinea unguium: Secondary | ICD-10-CM | POA: Diagnosis not present

## 2023-07-29 DIAGNOSIS — M2142 Flat foot [pes planus] (acquired), left foot: Secondary | ICD-10-CM | POA: Diagnosis not present

## 2023-07-29 DIAGNOSIS — E119 Type 2 diabetes mellitus without complications: Secondary | ICD-10-CM

## 2023-07-29 MED ORDER — NYSTATIN-TRIAMCINOLONE 100000-0.1 UNIT/GM-% EX OINT
1.0000 | TOPICAL_OINTMENT | Freq: Two times a day (BID) | CUTANEOUS | 1 refills | Status: AC
  Filled 2023-07-29: qty 60, 15d supply, fill #0
  Filled 2023-08-21: qty 60, 15d supply, fill #1

## 2023-07-29 NOTE — Progress Notes (Unsigned)
ANNUAL DIABETIC FOOT EXAM  Subjective: Andrea Montes presents today for annual diabetic foot exam.  Chief Complaint  Patient presents with   Nail Problem    Pt is here for [facility], last A1C was 6.2 PCP is Dr Nehemiah Settle and LOV was in October.   Patient confirms h/o diabetes.  Patient has been diagnosed with neuropathy.  Patient states her right foot has gotten worse with prescribed cream. The itching is more pronounced.  Andrea Dills, MD is patient's PCP.  Past Medical History:  Diagnosis Date   Anemia    no current med.   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   GERD (gastroesophageal reflux disease)    Heart murmur    last  echo- 2010   History of blood transfusion    History of shingles    Hyperlipidemia    Hypertension    borderline not on medicaiton at this time   Impingement syndrome of left shoulder region 10/2014   Joint pain    Migraines    Obesity    Rheumatoid arteritis (HCC)    Seasonal allergies    Sleep apnea    uses CPAP nightly   Trigger finger, left middle finger    Vitamin D deficiency    Wears partial dentures    upper   Patient Active Problem List   Diagnosis Date Noted   Urinary symptom or sign 03/12/2023   Hirsutism 07/20/2022   Hyperglycemia due to type 2 diabetes mellitus (HCC) 07/20/2022   Type 2 diabetes mellitus without complication, without long-term current use of insulin (HCC) 10/01/2018   Trigger  finger, right ring finger 09/09/2015   Low back pain radiating to both legs 05/06/2012   Leg length discrepancy 05/06/2012   HYPERTENSION, BENIGN 04/11/2009   LEG EDEMA, BILATERAL 10/08/2008   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 10/17/2006   Carpal tunnel syndrome, right upper limb 10/17/2006   Past Surgical History:  Procedure Laterality Date   CARPAL TUNNEL RELEASE Left 04/25/2006   CARPAL TUNNEL RELEASE Right 07/15/2018   Procedure: RIGHT CARPAL TUNNEL RELEASE;  Surgeon: Cammy Copa, MD;  Location: [facility] OR;  Service: Orthopedics;  Laterality: Right;   CLOSED MANIPULATION SHOULDER WITH STERIOD INJECTION Left 02/03/2015   Procedure: LEFT  MANIPULATION SHOULDER UNDER ANESTHESIA WITH INJECTION OF STEROID;  Surgeon: Mckinley Jewel, MD;  Location: [facility];  Service: Orthopedics;  Laterality: Left;   COLONOSCOPY     COLONOSCOPY WITH PROPOFOL  10/09/2011   POLYPECTOMY     RESECTION DISTAL CLAVICAL Left 11/04/2014   Procedure: RESECTION DISTAL CLAVICAL;  Surgeon: Mckinley Jewel, MD;  Location: [facility];  Service: Orthopedics;  Laterality: Left;   ROTATOR CUFF REPAIR  2010  SHOULDER ARTHROSCOPY WITH ROTATOR CUFF REPAIR Right 10/04/2008   SHOULDER ARTHROSCOPY WITH SUBACROMIAL DECOMPRESSION, ROTATOR CUFF REPAIR AND BICEP TENDON REPAIR Left 11/04/2014   Procedure: LEFT SHOULDER ARTHROSCOPY WITH DEBRIDEMENT, DISTAL CLAVICLE EXCISION, ACROMIOPLASTY, ROTATOR CUFF REPAIR AND BICEP TENODESIS;  Surgeon: Mckinley Jewel, MD;  Location: [facility];  Service: Orthopedics;  Laterality: Left;   TRIGGER FINGER RELEASE Right 02/03/2015   Procedure: RIGHT LONG FINGER TRIGGER RELEASE;  Surgeon: Mckinley Jewel, MD;  Location: [facility];  Service: Orthopedics;  Laterality: Right;   TRIGGER FINGER RELEASE Left  10/10/2017   Procedure: LEFT HAND RELEASE TRIGGER FINGER 3RD LONG FINGER AND CYST EXCISION;  Surgeon: Cammy Copa, MD;  Location: [facility] OR;  Service: Orthopedics;  Laterality: Left;   TRIGGER FINGER RELEASE Right 02/16/2021   Procedure: TRIGGER FINGER RELEASE RIGHT RING FINGER;  Surgeon: Cammy Copa, MD;  Location: [facility];  Service: Orthopedics;  Laterality: Right;   TUBAL LIGATION     VAGINAL HYSTERECTOMY  2001   partial   Current Outpatient Medications on File Prior to Visit  Medication Sig Dispense Refill   acyclovir (ZOVIRAX) 400 MG tablet Take 1 tablet (400 mg total) by mouth 4 (four) times daily. 50 tablet 0   Aspirin-Acetaminophen-Caffeine (GOODY HEADACHE PO) Take 1 Package by mouth as needed (pain).     blood glucose meter kit and supplies Check blood sugars as directed 1 each 0   Blood Glucose Monitoring Suppl (FREESTYLE LITE) w/Device KIT 1 kit. Use as directed 1 kit 0   Blood Pressure Monitoring (OMRON 3 SERIES BP MONITOR) DEVI      calcium carbonate (TUMS EX) 750 MG chewable tablet Chew 1-2 tablets by mouth 2 (two) times daily as needed for heartburn.     cephALEXin (KEFLEX) 500 MG capsule Take 1 capsule (500 mg total) by mouth 4 (four) times daily for 7 days 28 capsule 0   Cholecalciferol (VITAMIN D3) 50 MCG (2000 UT) TABS Take 2,000 Units by mouth every Friday.     Continuous Blood Gluc Sensor (FREESTYLE LIBRE 3 SENSOR) MISC Apply to upper back or arm change every 14 (fourteen) days. 2 each 11   COVID-19 At Home Antigen Test (CARESTART COVID-19 HOME TEST) KIT Use as directed 4 each 0   empagliflozin (JARDIANCE) 25 MG TABS tablet Take 1 tablet (25 mg total) by mouth daily. 90 tablet 3   empagliflozin (JARDIANCE) 25 MG TABS tablet Take 1 tablet (25 mg total) by mouth daily. 90 tablet 3   fluconazole (DIFLUCAN) 150 MG tablet Take 1 tablet (150 mg total) by mouth on day 1 and 1 tablet on day 3 as directed 2 tablet 0   FREESTYLE LITE test strip      furosemide (LASIX) 20 MG  tablet Take 1 tablet (20 mg total) by mouth daily. 90 tablet 3   furosemide (LASIX) 20 MG tablet Take 1 tablet by mouth once daily 90 tablet 3   glucose blood test strip Check blood sugars as directed. 100 each 3   Insulin Pen Needle 31G X 5 MM MISC Use once daily as directed 100 each 3   Lancets (FREESTYLE) lancets 1 each 3 (three) times daily.     losartan (COZAAR) 50 MG tablet Take 1 tablet (50 mg total) by mouth daily. 90 tablet 3   meloxicam (MOBIC) 15 MG tablet Take 1 tablet (15 mg total) by mouth daily as needed for pain. 60 tablet 1   meloxicam (MOBIC) 15 MG tablet Take 1  tablet by mouth once daily 90 tablet 3   meloxicam (MOBIC) 15 MG tablet Take 1 tablet (15 mg total) by mouth daily. 90 tablet 3   metFORMIN (GLUCOPHAGE-XR) 750 MG 24 hr tablet Take 2 tablets (1,500 mg total) by mouth daily with food 180 tablet 3   methocarbamol (ROBAXIN) 750 MG tablet Take 1 tablet (750 mg total) by mouth every 8 (eight) hours as needed. 30 tablet 1   Multiple Vitamin (MULTIVITAMIN WITH MINERALS) TABS tablet Take 1 tablet by mouth every other day. In the morning     mupirocin ointment (BACTROBAN) 2 % Apply 1 Application topically 2 (two) times daily. 22 g 1   phenazopyridine (PYRIDIUM) 200 MG tablet Take 1 tablet (200 mg total) by mouth 3 (three) times daily after meals. 6 tablet 0   Semaglutide, 2 MG/DOSE, (OZEMPIC, 2 MG/DOSE,) 8 MG/3ML SOPN Inject 2 mg into the skin once a week. 3 mL 11   traMADol (ULTRAM) 50 MG tablet Take 1 tablet (50 mg total) by mouth every 6 (six) hours as needed. 30 tablet 0   TRUEplus Lancets 30G MISC Use as directed 100 each 3   atorvastatin (LIPITOR) 10 MG tablet TAKE 1 TABLET (10 MG TOTAL) BY MOUTH AT BEDTIME. 90 tablet 3   SUMAtriptan (IMITREX) 25 MG tablet TAKE 1 TABLET BY MOUTH AT ONSET OF MIGRAINE, MAY REPEAT IN 1 HR IF NEEDED 15 tablet 5   Current Facility-Administered Medications on File Prior to Visit  Medication Dose Route Frequency Provider Last Rate Last Admin    0.9 %  sodium chloride infusion  500 mL Intravenous Continuous Nandigam, Kavitha V, MD        Allergies  Allergen Reactions   Lisinopril Other (See Comments), Shortness Of Breath and Cough    wheezing  Other Reaction(s): SOB/Wheezing   Kiwi Extract Itching and Other (See Comments)    Itching in mouth with KIWI, And pineapple juice- topically irritating    Pineapple Flavoring Agent (Non-Screening) Itching    Other reaction(s): Unknown   Social History   Occupational History   Not on file  Tobacco Use   Smoking status: Never   Smokeless tobacco: Never  Vaping Use   Vaping status: Never Used  Substance and Sexual Activity   Alcohol use: Yes    Comment: occ with a special occ   Drug use: No   Sexual activity: Not on file   Family History  Problem Relation Age of Onset   Kidney disease Brother    Hypertension Brother    Leukemia Mother    Heart disease Mother    Stroke Mother    Cancer Mother    Depression Mother    Anxiety disorder Mother    AAA (abdominal aortic aneurysm) Father    Esophageal cancer Maternal Uncle    Hypertension Brother    Colon cancer Neg Hx    Colon polyps Neg Hx    Rectal cancer Neg Hx    Stomach cancer Neg Hx    Immunization History  Administered Date(s) Administered   Influenza Whole 05/06/2009   Td 08/20/2001, 08/25/2007     Review of Systems: Negative except as noted in the HPI.   Objective: There were no vitals filed for this visit.  Andrea Montes is a pleasant 67 y.o. female in NAD. AAO X 3.  Title   Diabetic Foot Exam - detailed Date & Time: 07/29/2023  9:00 AM Diabetic Foot exam was performed with the following findings: Yes  Visual  Foot Exam completed.: Yes  Is there a history of foot ulcer?: No Is there a foot ulcer now?: No Is there swelling?: No Is there elevated skin temperature?: No Is there abnormal foot shape?: No Is there a claw toe deformity?: No Are the toenails long?: Yes Are the toenails thick?: Yes Are  the toenails ingrown?: No Is the skin thin, fragile, shiny and hairless?": No Normal Range of Motion?: Yes Is there foot or ankle muscle weakness?: No Do you have pain in calf while walking?: No Are the shoes appropriate in style and fit?: Yes Can the patient see the bottom of their feet?: Yes Pulse Foot Exam completed.: Yes   Right Posterior Tibialis: Present Left posterior Tibialis: Present   Right Dorsalis Pedis: Present Left Dorsalis Pedis: Present     Sensory Foot Exam Completed.: Yes Semmes-Weinstein Monofilament Test "+" means "has sensation" and "-" means "no sensation"  R Foot Test Control: Pos L Foot Test Control: Pos   R Site 1-Great Toe: Pos L Site 1-Great Toe: Pos   R Site 4: Pos L Site 4: Pos   R site 5: Pos L Site 5: Pos  R Site 6: Pos L Site 6: Pos     Image components are not supported.   Image components are not supported. Image components are not supported.  Tuning Fork Right vibratory: present Left vibratory: present  Comments Pt has subjective symptoms of neuropathy.  Pedal skin dry and cracked right foot. Longitudinal fissuring of skin noted right foot. Tenderness to palpation. No erythema, no edema, no drainage, no flocculence.  Pes planus b/l.       Lab Results  Component Value Date   HGBA1C 5.9 (H) 09/21/2022   ADA Risk Categorization: Low Risk :  Patient has all of the following: Intact protective sensation No prior foot ulcer  No severe deformity Pedal pulses present  Assessment: 1. Pain due to onychomycosis of toenails of both feet   2. Skin eruption   3. Pes planus of both feet   4. Diabetic peripheral neuropathy associated with type 2 diabetes mellitus (HCC)   5. Encounter for diabetic foot exam (HCC)     Plan: Orders Placed This Encounter  Procedures   CBC with Differential   Comprehensive metabolic panel   Meds ordered this encounter  Medications   nystatin-triamcinolone ointment (MYCOLOG)    Sig: Apply 1 Application  topically to both feet 2 (two) times daily.    Dispense:  60 g    Refill:  1   -Consent given for treatment as described below: -Examined patient. -We discussed worsening of skin eruption right foot unresponsive to topical treatment. I did send Rx for Mycolog ointment. She may also benefit from oral medication. Ordered lab work to check LFTs. Will have her see Dr. Lilian Kapur for oral management of skin eruption. If no improvement, will refer ot Dermatology. Patient is in agreement She will follow up with Dr. Lilian Kapur next week. -Diabetic foot examination performed today. -Continue diabetic foot care principles: inspect feet daily, monitor glucose as recommended by PCP and/or Endocrinologist, and follow prescribed diet per PCP, Endocrinologist and/or dietician. -Toenails 1-5 b/l were debrided in length and girth with sterile nail nippers and dremel without iatrogenic bleeding.  -Patient/POA to call should there be question/concern in the interim. Return in about 3 months (around 10/27/2023).  Freddie Breech, DPM      [facility] LOCATION: [address]  [place], [place] [address]                   Office [phone]   [facility] LOCATION: [address] [place], [place] [address] Office [phone]

## 2023-07-30 LAB — CBC WITH DIFFERENTIAL/PLATELET
Absolute Lymphocytes: 2675 {cells}/uL (ref 850–3900)
Absolute Monocytes: 692 {cells}/uL (ref 200–950)
Basophils Absolute: 38 {cells}/uL (ref 0–200)
Basophils Relative: 0.5 %
Eosinophils Absolute: 243 {cells}/uL (ref 15–500)
Eosinophils Relative: 3.2 %
HCT: 41.1 % (ref 35.0–45.0)
Hemoglobin: 12.6 g/dL (ref 11.7–15.5)
MCH: 21.6 pg — ABNORMAL LOW (ref 27.0–33.0)
MCHC: 30.7 g/dL — ABNORMAL LOW (ref 32.0–36.0)
MCV: 70.5 fL — ABNORMAL LOW (ref 80.0–100.0)
MPV: 11.3 fL (ref 7.5–12.5)
Monocytes Relative: 9.1 %
Neutro Abs: 3952 {cells}/uL (ref 1500–7800)
Neutrophils Relative %: 52 %
Platelets: 287 10*3/uL (ref 140–400)
RBC: 5.83 10*6/uL — ABNORMAL HIGH (ref 3.80–5.10)
RDW: 15.6 % — ABNORMAL HIGH (ref 11.0–15.0)
Total Lymphocyte: 35.2 %
WBC: 7.6 10*3/uL (ref 3.8–10.8)

## 2023-07-30 LAB — COMPREHENSIVE METABOLIC PANEL
AG Ratio: 1.3 (calc) (ref 1.0–2.5)
ALT: 7 U/L (ref 6–29)
AST: 21 U/L (ref 10–35)
Albumin: 3.8 g/dL (ref 3.6–5.1)
Alkaline phosphatase (APISO): 101 U/L (ref 37–153)
BUN: 15 mg/dL (ref 7–25)
CO2: 26 mmol/L (ref 20–32)
Calcium: 9.1 mg/dL (ref 8.6–10.4)
Chloride: 105 mmol/L (ref 98–110)
Creat: 0.85 mg/dL (ref 0.50–1.05)
Globulin: 3 g/dL (ref 1.9–3.7)
Glucose, Bld: 163 mg/dL — ABNORMAL HIGH (ref 65–99)
Potassium: 4.5 mmol/L (ref 3.5–5.3)
Sodium: 141 mmol/L (ref 135–146)
Total Bilirubin: 0.3 mg/dL (ref 0.2–1.2)
Total Protein: 6.8 g/dL (ref 6.1–8.1)

## 2023-08-05 ENCOUNTER — Encounter: Payer: Self-pay | Admitting: Podiatry

## 2023-08-05 ENCOUNTER — Other Ambulatory Visit (HOSPITAL_COMMUNITY): Payer: Self-pay

## 2023-08-05 ENCOUNTER — Ambulatory Visit (INDEPENDENT_AMBULATORY_CARE_PROVIDER_SITE_OTHER): Payer: HMO | Admitting: Podiatry

## 2023-08-05 DIAGNOSIS — B353 Tinea pedis: Secondary | ICD-10-CM | POA: Diagnosis not present

## 2023-08-05 DIAGNOSIS — B351 Tinea unguium: Secondary | ICD-10-CM | POA: Diagnosis not present

## 2023-08-05 MED ORDER — TERBINAFINE HCL 250 MG PO TABS
250.0000 mg | ORAL_TABLET | Freq: Every day | ORAL | 0 refills | Status: DC
  Filled 2023-08-05: qty 84, 84d supply, fill #0
  Filled 2023-08-21: qty 84, 84d supply, fill #1

## 2023-08-05 NOTE — Patient Instructions (Signed)
VISIT SUMMARY:  During today's visit, we discussed your persistent skin issue on the foot, which has not improved with the use of clotrimazole cream. You reported burning and itching sensations in the affected area, along with a nail fungus on one toe. We also reviewed your diabetes management, which remains well-controlled.  YOUR PLAN:  -TINEA PEDIS AND ONYCHOMYCOSIS: Tinea Pedis, commonly known as athlete's foot, is a fungal infection that causes dry, flaky, and itchy skin, while Onychomycosis is a fungal infection of the nail. You will continue using the prescribed topical medication, start taking Terbinafine (Lamisil) for 90 days, use antifungal spray inside your shoes daily, and clean the shower and bathroom floors weekly with a bleach-based cleaner.  -DIABETES: Diabetes is a condition that affects how your body processes blood sugar. Your diabetes remains well-controlled, and you will continue with your current management plan.  INSTRUCTIONS:  Please follow up in 3 months for a re-evaluation of your skin condition and nail fungus. Continue with your current diabetes management and monitor your blood sugar levels as advised.

## 2023-08-05 NOTE — Progress Notes (Signed)
  Subjective:  Patient ID: Andrea Montes, female    DOB: 1956-05-04,  MRN: [id number]  Chief Complaint  Patient presents with   Tinea Pedis    "I have fungus on this foot that Dr. Eloy End wants him to look at."    Discussed the use of AI scribe software for clinical note transcription with the patient, who gave verbal consent to proceed.  History of Present Illness   The patient presents with a persistent skin issue on the foot, described as dry and flaky, which has not improved with the use of prescribed clotrimazole cream. The patient reports experiencing burning and itching sensations in the affected area. There is no history of other skin conditions such as eczema or psoriasis. The patient also reports a nail fungus on one toe. The patient has a history of diabetes, which is reportedly well-controlled. The patient has been experiencing significant discomfort due to the itching, particularly when the foot becomes wet. The patient has no history of liver or kidney problems.          Objective:    Physical Exam   EXTREMITIES: Right foot warm and well perfused, palpable pulses present. SKIN: Dry, itching, scaling rash present on the plantar surfaces and lateral ankle. Onychomycosis involving seventy five percent of the nail plate of the right hallux nail.           Results   LABS Comprehensive Metabolic Panel: Mild hyperglycemia, normal liver function, normal kidney function (07/29/2023) CBC: Normal (07/29/2023)      Assessment:   1. Onychomycosis   2. Tinea pedis, right      Plan:  Patient was evaluated and treated and all questions answered.  Assessment and Plan    Tinea Pedis and Onychomycosis   She exhibits persistent dry, flaky, itchy skin on the plantar foot and interdigitally, extending up the lateral ankle, with Onychomycosis affecting 75% of the right hallux nail plate. We discussed the necessity of treating both conditions concurrently. She will continue  using the prescribed topical medication, start Terbinafine (Lamisil) for 90 days, use antifungal spray inside shoes daily, and clean the shower and bathroom floors weekly with a bleach-based cleaner. A follow-up is scheduled in 3 months.  Diabetes   Her diabetes remains well-controlled, with mild hyperglycemia noted on recent labs. She will continue the current management.          Return in about 3 months (around 11/03/2023) for follow up after nail and athletes foot fungus treatment.

## 2023-08-09 ENCOUNTER — Ambulatory Visit (HOSPITAL_COMMUNITY): Payer: HMO | Attending: Internal Medicine

## 2023-08-09 DIAGNOSIS — R9431 Abnormal electrocardiogram [ECG] [EKG]: Secondary | ICD-10-CM | POA: Insufficient documentation

## 2023-08-09 LAB — ECHOCARDIOGRAM COMPLETE
Area-P 1/2: 3.34 cm2
S' Lateral: 1.9 cm

## 2023-08-12 ENCOUNTER — Other Ambulatory Visit (HOSPITAL_COMMUNITY): Payer: Self-pay

## 2023-08-21 ENCOUNTER — Other Ambulatory Visit (HOSPITAL_COMMUNITY): Payer: Self-pay

## 2023-08-22 ENCOUNTER — Other Ambulatory Visit (HOSPITAL_COMMUNITY): Payer: Self-pay

## 2023-08-22 ENCOUNTER — Other Ambulatory Visit: Payer: Self-pay

## 2023-08-22 MED ORDER — JARDIANCE 25 MG PO TABS
25.0000 mg | ORAL_TABLET | Freq: Every day | ORAL | 3 refills | Status: AC
  Filled 2023-08-22: qty 90, 90d supply, fill #0
  Filled 2023-11-14: qty 90, 90d supply, fill #1
  Filled 2024-03-31: qty 90, 90d supply, fill #2
  Filled 2024-07-10 (×2): qty 90, 90d supply, fill #3

## 2023-08-22 MED ORDER — METHOCARBAMOL 750 MG PO TABS
750.0000 mg | ORAL_TABLET | Freq: Three times a day (TID) | ORAL | 1 refills | Status: DC | PRN
  Filled 2023-08-22: qty 30, 10d supply, fill #0
  Filled 2024-03-31: qty 30, 10d supply, fill #1

## 2023-08-22 MED ORDER — OZEMPIC (2 MG/DOSE) 8 MG/3ML ~~LOC~~ SOPN
2.0000 mg | PEN_INJECTOR | SUBCUTANEOUS | 11 refills | Status: DC
  Filled 2023-08-22: qty 3, 28d supply, fill #0
  Filled 2023-08-23: qty 9, 84d supply, fill #0
  Filled 2023-11-14: qty 9, 84d supply, fill #1

## 2023-08-22 MED ORDER — LOSARTAN POTASSIUM 50 MG PO TABS
50.0000 mg | ORAL_TABLET | Freq: Every day | ORAL | 1 refills | Status: DC
  Filled 2023-08-22: qty 90, 90d supply, fill #0
  Filled 2023-11-14: qty 90, 90d supply, fill #1

## 2023-08-23 ENCOUNTER — Other Ambulatory Visit (HOSPITAL_COMMUNITY): Payer: Self-pay

## 2023-08-28 ENCOUNTER — Other Ambulatory Visit (INDEPENDENT_AMBULATORY_CARE_PROVIDER_SITE_OTHER): Payer: 59

## 2023-08-28 ENCOUNTER — Ambulatory Visit: Payer: 59 | Admitting: Orthopedic Surgery

## 2023-08-28 ENCOUNTER — Encounter: Payer: Self-pay | Admitting: Orthopedic Surgery

## 2023-08-28 DIAGNOSIS — M545 Low back pain, unspecified: Secondary | ICD-10-CM | POA: Diagnosis not present

## 2023-08-28 NOTE — Progress Notes (Signed)
 Office Visit Note   Patient: Andrea Montes           Date of Birth: [birth year]           MRN: [id number] Visit Date: 08/28/2023 Requested by: Rexanne Ingle, MD [address] [place],  [place] [address] PCP: Rexanne Ingle, MD  Subjective: Chief Complaint  Patient presents with   Lower Back - Pain   Right Shoulder - Pain    HPI: Andrea Montes is a 68 y.o. female who presents to the office reporting low back pain and bilateral buttock pain.  She is doing well from her right shoulder injection given 07/03/2023.  She is having some pain in the lower back with no known history of injury.  Does describe seeing neurosurgery about 4 or 5 years ago.  Had epidural steroid injections at that time but her last injection gave her some type of allergic reaction and she has not had any of those yet since.  Patient describes decreased walking and standing endurance.  Takes Mobic  for her symptoms.  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                ROS: All systems reviewed are negative as they relate to the chief complaint within the history of present illness.  Patient denies fevers or chills.  Assessment & Plan: Visit Diagnoses:  1. Low back pain, unspecified back pain laterality, unspecified chronicity, unspecified whether sciatica present     Plan: Impression is bilateral buttock pain consistent with spondylolisthesis at multiple levels in her lumbar spine.  Needs more recent MRI of her lumbar spine to consider surgical intervention.  I do not think she really feels comfortable getting any more epidural steroid injections.  MRI lumbar spine pending.  I would likely have her follow-up with Dr. Georgina after that study.  Follow-Up Instructions: No follow-ups on file.   Orders:  Orders Placed This Encounter  Procedures   XR Lumbar Spine 2-3 Views   No orders of the defined types were placed in this encounter.     Procedures: No procedures performed   Clinical  Data: No additional findings.  Objective: Vital Signs: There were no vitals taken for this visit.  Physical Exam:  Constitutional: Patient appears well-developed HEENT:  Head: Normocephalic Eyes:EOM are normal Neck: Normal range of motion Cardiovascular: Normal rate Pulmonary/chest: Effort normal Neurologic: Patient is alert Skin: Skin is warm Psychiatric: Patient has normal mood and affect  Ortho Exam: Ortho exam demonstrates normal gait alignment.  She does have palpable pedal pulses and positive nerve root tension signs bilaterally.  No groin pain with internal/external rotation of either leg and no real restriction of internal rotation on either side.  5 out of 5 ankle dorsiflexion plantarflexion quad and hamstring strength with palpable pedal pulses.  No definite paresthesias L1 S1 bilaterally.  Has very good hip flexion abduction adduction strength as well.  Does have pain with forward and lateral bending.  Specialty Comments:  No specialty comments available.  Imaging: XR Lumbar Spine 2-3 Views Result Date: 08/28/2023 AP lateral lumbar spine radiographs reviewed.  Patient has minimal degenerative disc disease in the lumbar spine but does have spondylolisthesis at both L4-5 and L5-S1.  No acute fracture.    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 10/17/2006   Carpal tunnel syndrome, right upper limb 10/17/2006   Past Medical History:  Diagnosis Date   Anemia    no current med.   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   GERD (gastroesophageal reflux disease)    Heart murmur    last  echo- 2010   History of blood transfusion    History of shingles    Hyperlipidemia    Hypertension    borderline not on medicaiton at this time   Impingement syndrome of left shoulder region 10/2014   Joint pain    Migraines    Obesity    Rheumatoid arteritis (HCC)    Seasonal allergies    Sleep apnea    uses CPAP nightly   Trigger finger, left middle finger    Vitamin D  deficiency    Wears partial dentures    upper    Family History  Problem Relation Age of Onset   Kidney disease Brother    Hypertension Brother    Leukemia Mother    Heart disease Mother    Stroke Mother    Cancer Mother    Depression Mother    Anxiety disorder Mother    AAA (abdominal aortic aneurysm) Father    Esophageal cancer Maternal Uncle    Hypertension Brother    Colon cancer Neg Hx    Colon polyps Neg Hx    Rectal cancer Neg Hx    Stomach cancer Neg Hx     Past Surgical History:  Procedure Laterality Date   CARPAL TUNNEL RELEASE Left 04/25/2006   CARPAL TUNNEL RELEASE Right 07/15/2018   Procedure: RIGHT CARPAL TUNNEL RELEASE;  Surgeon: Addie Cordella Hamilton, MD;  Location: [facility];  Service: Orthopedics;  Laterality:  Right;   CLOSED MANIPULATION SHOULDER WITH STERIOD INJECTION Left 02/03/2015   Procedure: LEFT  MANIPULATION SHOULDER UNDER ANESTHESIA WITH INJECTION OF STEROID;  Surgeon: Toribio Chancy, MD;  Location: [facility];  Service: Orthopedics;  Laterality: Left;   COLONOSCOPY     COLONOSCOPY WITH PROPOFOL   10/09/2011   POLYPECTOMY     RESECTION DISTAL CLAVICAL Left 11/04/2014   Procedure: RESECTION DISTAL CLAVICAL;  Surgeon: Toribio Chancy, MD;  Location: [facility];  Service: Orthopedics;  Laterality: Left;   ROTATOR CUFF REPAIR  2010   SHOULDER ARTHROSCOPY WITH ROTATOR CUFF REPAIR Right 10/04/2008   SHOULDER ARTHROSCOPY WITH SUBACROMIAL DECOMPRESSION, ROTATOR CUFF REPAIR  AND BICEP TENDON REPAIR Left 11/04/2014   Procedure: LEFT SHOULDER ARTHROSCOPY WITH DEBRIDEMENT, DISTAL CLAVICLE EXCISION, ACROMIOPLASTY, ROTATOR CUFF REPAIR AND BICEP TENODESIS;  Surgeon: Toribio Chancy, MD;  Location: [facility];  Service: Orthopedics;  Laterality: Left;   TRIGGER FINGER RELEASE Right 02/03/2015   Procedure: RIGHT LONG FINGER TRIGGER RELEASE;  Surgeon: Toribio Chancy, MD;  Location: [facility];  Service: Orthopedics;  Laterality: Right;   TRIGGER FINGER RELEASE Left 10/10/2017   Procedure: LEFT HAND RELEASE TRIGGER FINGER 3RD LONG FINGER AND CYST EXCISION;  Surgeon: Addie Cordella Hamilton, MD;  Location: [facility];  Service: Orthopedics;  Laterality: Left;   TRIGGER FINGER RELEASE Right 02/16/2021   Procedure: TRIGGER FINGER RELEASE RIGHT RING FINGER;  Surgeon: Addie Cordella Hamilton, MD;  Location: [facility];  Service: Orthopedics;  Laterality: Right;   TUBAL LIGATION     VAGINAL HYSTERECTOMY  2001   partial   Social History   Occupational History   Not on file  Tobacco Use   Smoking status: Never   Smokeless tobacco: Never  Vaping Use   Vaping status: Never Used  Substance and Sexual Activity   Alcohol use: Not Currently    Comment: occ with a special occ   Drug  use: No   Sexual activity: Not on file

## 2023-09-06 DIAGNOSIS — Z1231 Encounter for screening mammogram for malignant neoplasm of breast: Secondary | ICD-10-CM | POA: Diagnosis not present

## 2023-09-11 DIAGNOSIS — H6121 Impacted cerumen, right ear: Secondary | ICD-10-CM | POA: Diagnosis not present

## 2023-09-11 DIAGNOSIS — H9311 Tinnitus, right ear: Secondary | ICD-10-CM | POA: Diagnosis not present

## 2023-09-12 ENCOUNTER — Ambulatory Visit
Admission: RE | Admit: 2023-09-12 | Discharge: 2023-09-12 | Disposition: A | Discharge status: Home / Self Care | Payer: 59 | Source: Ambulatory Visit | Attending: Orthopedic Surgery | Admitting: Orthopedic Surgery

## 2023-09-12 DIAGNOSIS — M5124 Other intervertebral disc displacement, thoracic region: Secondary | ICD-10-CM | POA: Diagnosis not present

## 2023-09-12 DIAGNOSIS — M48061 Spinal stenosis, lumbar region without neurogenic claudication: Secondary | ICD-10-CM | POA: Diagnosis not present

## 2023-09-12 DIAGNOSIS — H93293 Other abnormal auditory perceptions, bilateral: Secondary | ICD-10-CM | POA: Diagnosis not present

## 2023-09-12 DIAGNOSIS — H9311 Tinnitus, right ear: Secondary | ICD-10-CM | POA: Diagnosis not present

## 2023-09-12 DIAGNOSIS — M47816 Spondylosis without myelopathy or radiculopathy, lumbar region: Secondary | ICD-10-CM | POA: Diagnosis not present

## 2023-09-12 DIAGNOSIS — M545 Low back pain, unspecified: Secondary | ICD-10-CM

## 2023-09-13 ENCOUNTER — Encounter: Payer: Self-pay | Admitting: Surgical

## 2023-09-13 ENCOUNTER — Ambulatory Visit (INDEPENDENT_AMBULATORY_CARE_PROVIDER_SITE_OTHER): Payer: 59 | Admitting: Surgical

## 2023-09-13 DIAGNOSIS — M6520 Calcific tendinitis, unspecified site: Secondary | ICD-10-CM

## 2023-09-13 DIAGNOSIS — M792 Neuralgia and neuritis, unspecified: Secondary | ICD-10-CM | POA: Diagnosis not present

## 2023-09-13 MED ORDER — BUPIVACAINE HCL 0.5 % IJ SOLN
9.0000 mL | INTRAMUSCULAR | Status: AC | PRN
  Administered 2023-09-13: 9 mL via INTRA_ARTICULAR

## 2023-09-13 MED ORDER — LIDOCAINE HCL 1 % IJ SOLN
5.0000 mL | INTRAMUSCULAR | Status: AC | PRN
  Administered 2023-09-13: 5 mL

## 2023-09-13 MED ORDER — METHYLPREDNISOLONE ACETATE 40 MG/ML IJ SUSP
40.0000 mg | INTRAMUSCULAR | Status: AC | PRN
  Administered 2023-09-13: 40 mg via INTRA_ARTICULAR

## 2023-09-13 NOTE — Progress Notes (Signed)
Office Visit Note   Patient: Andrea Montes           Date of Birth: 03/23/1956           MRN: [id number] Visit Date: 09/13/2023 Requested by: Renford Dills, MD [address] [place],  [place] [address] PCP: Renford Dills, MD  Subjective: Chief Complaint  Patient presents with   Right Shoulder - Pain    HPI: Andrea Montes is a 68 y.o. female who presents to the office reporting right shoulder pain.  Patient states that she had prior cortisone injection in early November with Dr. August Saucer for suspected calcific tendinitis.  She does have history of prior rotator cuff repair to her right shoulder that was done at an outside facility.  She notes that she did great with that subacromial injection up until yesterday when she began to have a little bit of pain that truly erupted this morning when she woke up and has caused her severe constant pain ever since.  She reports primarily pain around the anterior lateral aspect of the shoulder with some radiation into the trapezius muscle and scapular region as well as tingling sensation that radiates all the way down into her right hand.  No left arm symptoms.  She has weakness with lifting her arm that seems more secondary to pain.  She has pain with cervical spine range of motion.  Shoulder pain radiates to the elbow but does not radiate past the elbow aside from the tingling sensation.  She has numbness in her hand both on the palmar and dorsal aspect of the hand and this is new for her that she did not have any sort of the symptoms prior to Wednesday..                ROS: All systems reviewed are negative as they relate to the chief complaint within the history of present illness.  Patient denies fevers or chills.  Assessment & Plan: Visit Diagnoses:  1. Calcific tendinitis     Plan: Patient is a 67 year old female who presents for evaluation of right shoulder pain.  Prior subacromial injection for calcific tendinitis helped her  symptoms greatly in early November 2024.  She would like to repeat this today after repeat onset of pain in the right shoulder that feels somewhat similar to how it did at that time.  She does have some elements that seem more consistent with radicular pain from the cervical spine with tingling that travels down the entire length of the arm as well as pain in the scapular and trapezius regions and new numbness in her hands.  However, she has multiple photo shoots over the weekend and she would like to try injection to see if this will give her some relief so subacromial injection was administered and patient tolerated procedure well without complication.  We will see how this does and recommended patient send a MyChart message on Monday/Tuesday or call the office early next week and if no improvement, next step would be cervical spine MRI.  She has prior cervical spine radiographs from 2024 demonstrating moderate degenerative changes throughout the cervical spine.  Follow-Up Instructions: No follow-ups on file.   Orders:  No orders of the defined types were placed in this encounter.  No orders of the defined types were placed in this encounter.     Procedures: Large Joint Inj: R subacromial bursa on 09/13/2023 4:42 PM Indications: diagnostic evaluation and pain Details: 18 G 1.5  in needle, posterior approach  Arthrogram: No  Medications: 9 mL bupivacaine 0.5 %; 40 mg methylPREDNISolone acetate 40 MG/ML; 5 mL lidocaine 1 % Outcome: tolerated well, no immediate complications Procedure, treatment alternatives, risks and benefits explained, specific risks discussed. Consent was given by the patient. Immediately prior to procedure a time out was called to verify the correct patient, procedure, equipment, support staff and site/side marked as required. Patient was prepped and draped in the usual sterile fashion.       Clinical Data: No additional findings.  Objective: Vital Signs: There were  no vitals taken for this visit.  Physical Exam:  Constitutional: Patient appears well-developed HEENT:  Head: Normocephalic Eyes:EOM are normal Neck: Normal range of motion Cardiovascular: Normal rate Pulmonary/chest: Effort normal Neurologic: Patient is alert Skin: Skin is warm Psychiatric: Patient has normal mood and affect  Ortho Exam: Ortho exam demonstrates right shoulder with intact rotator cuff strength of supra, infra, subscap.  Axillary nerve is intact with deltoid firing.  Intact EPL, FPL, finger abduction, pronation/supination, bicep, tricep with excellent strength.  She does have minimal tenderness over the bicipital groove.  Mild to moderate tenderness over the [facility] joint.  She has positive Neer and Hawkins impingement signs.  Negative Lhermitte sign but positive Spurling sign reproducing pain that radiates from the neck down the trapezius muscle into the shoulder.  She has reduced cervical spine range of motion with rotation to the right.  Specialty Comments:  No specialty comments available.  Imaging: No results found.   PMFS History: Patient Active Problem List   Diagnosis Date Noted   Urinary symptom or sign 03/12/2023   Hirsutism 07/20/2022   Hyperglycemia due to type 2 diabetes mellitus (HCC) 07/20/2022   Type 2 diabetes mellitus without complication, without long-term current use of insulin (HCC) 10/01/2018   Trigger finger, right ring finger 09/09/2015   Low back pain radiating to both legs 05/06/2012   Leg length discrepancy 05/06/2012   HYPERTENSION, BENIGN 04/11/2009   LEG EDEMA, BILATERAL 10/08/2008   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 10/17/2006   Carpal tunnel syndrome, right upper limb 10/17/2006   Past Medical History:  Diagnosis Date   Anemia    no current med.   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   GERD (gastroesophageal reflux disease)    Heart murmur    last  echo- 2010   History of blood transfusion    History of shingles    Hyperlipidemia    Hypertension    borderline not on medicaiton at this time   Impingement syndrome of left shoulder region 10/2014   Joint pain    Migraines    Obesity    Rheumatoid arteritis (HCC)    Seasonal allergies  Sleep apnea    uses CPAP nightly   Trigger finger, left middle finger    Vitamin D deficiency    Wears partial dentures    upper    Family History  Problem Relation Age of Onset   Kidney disease Brother    Hypertension Brother    Leukemia Mother    Heart disease Mother    Stroke Mother    Cancer Mother    Depression Mother    Anxiety disorder Mother    AAA (abdominal aortic aneurysm) Father    Esophageal cancer Maternal Uncle    Hypertension Brother    Colon cancer Neg Hx    Colon polyps Neg Hx    Rectal cancer Neg Hx    Stomach cancer Neg Hx     Past Surgical History:  Procedure Laterality Date   CARPAL TUNNEL RELEASE Left 04/25/2006   CARPAL TUNNEL RELEASE Right 07/15/2018   Procedure: RIGHT CARPAL TUNNEL RELEASE;  Surgeon: Cammy Copa, MD;  Location: [facility];  Service: Orthopedics;  Laterality: Right;   CLOSED MANIPULATION SHOULDER WITH STERIOD INJECTION Left 02/03/2015   Procedure: LEFT  MANIPULATION SHOULDER UNDER ANESTHESIA WITH INJECTION OF STEROID;  Surgeon: Mckinley Jewel,  MD;  Location: [facility];  Service: Orthopedics;  Laterality: Left;   COLONOSCOPY     COLONOSCOPY WITH PROPOFOL  10/09/2011   POLYPECTOMY     RESECTION DISTAL CLAVICAL Left 11/04/2014   Procedure: RESECTION DISTAL CLAVICAL;  Surgeon: Mckinley Jewel, MD;  Location: [facility];  Service: Orthopedics;  Laterality: Left;   ROTATOR CUFF REPAIR  2010   SHOULDER ARTHROSCOPY WITH ROTATOR CUFF REPAIR Right 10/04/2008   SHOULDER ARTHROSCOPY WITH SUBACROMIAL DECOMPRESSION, ROTATOR CUFF REPAIR AND BICEP TENDON REPAIR Left 11/04/2014   Procedure: LEFT SHOULDER ARTHROSCOPY WITH DEBRIDEMENT, DISTAL CLAVICLE EXCISION, ACROMIOPLASTY, ROTATOR CUFF REPAIR AND BICEP TENODESIS;  Surgeon: Mckinley Jewel, MD;  Location: [facility];  Service: Orthopedics;  Laterality: Left;   TRIGGER FINGER RELEASE Right 02/03/2015   Procedure: RIGHT LONG FINGER TRIGGER RELEASE;  Surgeon: Mckinley Jewel, MD;  Location: [facility];  Service: Orthopedics;  Laterality: Right;   TRIGGER FINGER RELEASE Left 10/10/2017   Procedure: LEFT HAND RELEASE TRIGGER FINGER 3RD LONG FINGER AND CYST EXCISION;  Surgeon: Cammy Copa, MD;  Location: [facility];  Service: Orthopedics;  Laterality: Left;   TRIGGER FINGER RELEASE Right 02/16/2021   Procedure: TRIGGER FINGER RELEASE RIGHT RING FINGER;  Surgeon: Cammy Copa, MD;  Location: [facility];  Service: Orthopedics;  Laterality: Right;   TUBAL LIGATION     VAGINAL HYSTERECTOMY  2001   partial   Social History   Occupational History   Not on file  Tobacco Use   Smoking status: Never   Smokeless tobacco: Never  Vaping Use   Vaping status: Never Used  Substance and Sexual Activity   Alcohol use: Not Currently    Comment: occ with a special occ   Drug use: No   Sexual activity: Not on file

## 2023-09-14 ENCOUNTER — Other Ambulatory Visit: Payer: 59

## 2023-09-18 NOTE — Telephone Encounter (Signed)
Can we get Andrea Montes set up for a MRI cervical spine?  Thank you

## 2023-09-19 ENCOUNTER — Other Ambulatory Visit: Payer: Self-pay

## 2023-09-19 DIAGNOSIS — M792 Neuralgia and neuritis, unspecified: Secondary | ICD-10-CM

## 2023-09-30 ENCOUNTER — Ambulatory Visit
Admission: RE | Admit: 2023-09-30 | Discharge: 2023-09-30 | Disposition: A | Discharge status: Home / Self Care | Payer: 59 | Source: Ambulatory Visit | Attending: Surgical | Admitting: Surgical

## 2023-09-30 DIAGNOSIS — M5412 Radiculopathy, cervical region: Secondary | ICD-10-CM | POA: Diagnosis not present

## 2023-09-30 DIAGNOSIS — M792 Neuralgia and neuritis, unspecified: Secondary | ICD-10-CM

## 2023-10-02 ENCOUNTER — Ambulatory Visit (INDEPENDENT_AMBULATORY_CARE_PROVIDER_SITE_OTHER): Payer: 59 | Admitting: Orthopedic Surgery

## 2023-10-02 ENCOUNTER — Other Ambulatory Visit (HOSPITAL_COMMUNITY): Payer: Self-pay

## 2023-10-02 ENCOUNTER — Other Ambulatory Visit (INDEPENDENT_AMBULATORY_CARE_PROVIDER_SITE_OTHER): Payer: 59

## 2023-10-02 VITALS — BP 170/98 | HR 101 | Ht 63.0 in | Wt 259.0 lb

## 2023-10-02 DIAGNOSIS — M545 Low back pain, unspecified: Secondary | ICD-10-CM

## 2023-10-02 MED ORDER — METHYLPREDNISOLONE 4 MG PO TBPK
ORAL_TABLET | ORAL | 0 refills | Status: DC
  Filled 2023-10-02: qty 21, 6d supply, fill #0
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
## 2023-10-09 ENCOUNTER — Ambulatory Visit: Payer: 59 | Admitting: Orthopedic Surgery

## 2023-10-13 DIAGNOSIS — E119 Type 2 diabetes mellitus without complications: Secondary | ICD-10-CM | POA: Diagnosis not present

## 2023-10-13 DIAGNOSIS — H43811 Vitreous degeneration, right eye: Secondary | ICD-10-CM | POA: Diagnosis not present

## 2023-10-13 DIAGNOSIS — H524 Presbyopia: Secondary | ICD-10-CM | POA: Diagnosis not present

## 2023-10-14 ENCOUNTER — Other Ambulatory Visit: Payer: Self-pay

## 2023-10-14 ENCOUNTER — Ambulatory Visit (INDEPENDENT_AMBULATORY_CARE_PROVIDER_SITE_OTHER): Payer: 59 | Admitting: Physical Medicine and Rehabilitation

## 2023-10-14 VITALS — BP 128/83 | HR 78

## 2023-10-14 DIAGNOSIS — M5416 Radiculopathy, lumbar region: Secondary | ICD-10-CM | POA: Diagnosis not present

## 2023-10-14 MED ORDER — METHYLPREDNISOLONE ACETATE 40 MG/ML IJ SUSP
40.0000 mg | Freq: Once | INTRAMUSCULAR | Status: AC
  Administered 2023-10-14: 40 mg

## 2023-10-14 NOTE — Progress Notes (Signed)
 Pain Scale---4 No Contrast dye No thinners

## 2023-10-14 NOTE — Patient Instructions (Signed)

## 2023-10-14 NOTE — Procedures (Signed)
 Lumbar Epidural Steroid Injection - Interlaminar Approach with Fluoroscopic Guidance  Patient: Andrea Montes      Date of Birth: Jul 03, 1956 MRN: [id number] PCP: Renford Dills, MD      Visit Date: 10/14/2023   Universal Protocol:     Consent Given By: the patient  Position: PRONE  Additional Comments: Vital signs were monitored before and after the procedure. Patient was prepped and draped in the usual sterile fashion. The correct patient, procedure, and site was verified.   Injection Procedure Details:   Procedure diagnoses: Lumbar radiculopathy [M54.16]   Meds Administered:  Meds ordered this encounter  Medications   methylPREDNISolone acetate (DEPO-MEDROL) injection 40 mg     Laterality: Right  Location/Site:  L4-5  Needle: 4.5 in., 20 ga. Tuohy  Needle Placement: Paramedian epidural  Findings:   -Comments: Excellent flow of contrast into the epidural space.  Procedure Details: Using a paramedian approach from the side mentioned above, the region overlying the inferior lamina was localized under fluoroscopic visualization and the soft tissues overlying this structure were infiltrated with 4 ml. of 1% Lidocaine without Epinephrine. The Tuohy needle was inserted into the epidural space using a paramedian approach.   The epidural space was localized using loss of resistance along with counter oblique bi-planar fluoroscopic views.  After negative aspirate for air, blood, and CSF, a 2 ml. volume of Isovue-250 was injected into the epidural space and the flow of contrast was observed. Radiographs were obtained for documentation purposes.    The injectate was administered into the level noted above.   Additional Comments:  No complications occurred Dressing: 2 x 2 sterile gauze and Band-Aid    Post-procedure details: Patient was observed during the procedure. Post-procedure instructions were reviewed.  Patient left the clinic in stable condition.

## 2023-10-14 NOTE — Progress Notes (Signed)
 Andrea Montes - 68 y.o. female MRN [id number]  Date of birth: 08-Dec-1955  Office Visit Note: Visit Date: 10/14/2023 PCP: Renford Dills, MD Referred by: London Sheer, MD  Subjective: Chief Complaint  Patient presents with   Neck - Pain   HPI:  Andrea Montes is a 68 y.o. female who comes in today at the request of Dr. Willia Craze for planned Right L4-5 Lumbar Interlaminar epidural steroid injection with fluoroscopic guidance.  The patient has failed conservative care including home exercise, medications, time and activity modification.  This injection will be diagnostic and hopefully therapeutic.  Please see requesting physician notes for further details and justification. Consider facet blocks.    ROS Otherwise per HPI.  Assessment & Plan: Visit Diagnoses:    ICD-10-CM   1. Lumbar radiculopathy  M54.16 XR C-ARM NO REPORT    Epidural Steroid injection    methylPREDNISolone acetate (DEPO-MEDROL) injection 40 mg      Plan: No additional findings.   Meds & Orders:  Meds ordered this encounter  Medications   methylPREDNISolone acetate (DEPO-MEDROL) injection 40 mg    Orders Placed This Encounter  Procedures   XR C-ARM NO REPORT   Epidural Steroid injection    Follow-up: Return for visit to requesting provider as needed.   Procedures: No procedures performed  Lumbar Epidural Steroid Injection - Interlaminar Approach with Fluoroscopic Guidance  Patient: Andrea Montes      Date of Birth: May 31, 1956 MRN: [id number] PCP: Renford Dills, MD      Visit Date: 10/14/2023   Universal Protocol:     Consent Given By: the patient  Position: PRONE  Additional Comments: Vital signs were monitored before and after the procedure. Patient was prepped and draped in the usual sterile fashion. The correct patient, procedure, and site was verified.   Injection Procedure Details:   Procedure diagnoses: Lumbar radiculopathy [M54.16]   Meds Administered:  Meds  ordered this encounter  Medications   methylPREDNISolone acetate (DEPO-MEDROL) injection 40 mg     Laterality: Right  Location/Site:  L4-5  Needle: 4.5 in., 20 ga. Tuohy  Needle Placement: Paramedian epidural  Findings:   -Comments: Excellent flow of contrast into the epidural space.  Procedure Details: Using a paramedian approach from the side mentioned above, the region overlying the inferior lamina was localized under fluoroscopic visualization and the soft tissues overlying this structure were infiltrated with 4 ml. of 1% Lidocaine without Epinephrine. The Tuohy needle was inserted into the epidural space using a paramedian approach.   The epidural space was localized using loss of resistance along with counter oblique bi-planar fluoroscopic views.  After negative aspirate for air, blood, and CSF, a 2 ml. volume of Isovue-250 was injected into the epidural space and the flow of contrast was observed. Radiographs were obtained for documentation purposes.    The injectate was administered into the level noted above.   Additional Comments:  No complications occurred Dressing: 2 x 2 sterile gauze and Band-Aid    Post-procedure details: Patient was observed during the procedure. Post-procedure instructions were reviewed.  Patient left the clinic in stable condition.   Clinical History: MRI LUMBAR SPINE WITHOUT CONTRAST   TECHNIQUE: Multiplanar, multisequence MR imaging of the lumbar spine was performed. No intravenous contrast was administered.   COMPARISON:  05/17/2014   FINDINGS: Segmentation:  Standard.   Alignment: Minimal grade 1 anterolisthesis of L4 on L5 and L5 on S1.   Vertebrae: No acute fracture, evidence of discitis, or aggressive  bone lesion.   Conus medullaris and cauda equina: Conus extends to the L2 level. Conus and cauda equina appear normal.   Paraspinal and other soft tissues: No acute paraspinal abnormality.   Disc levels:   Disc spaces:  Mild disc height loss L5-S1.   T12-L1: No significant disc bulge. No neural foraminal stenosis. No central canal stenosis.   L1-L2: No significant disc bulge. No neural foraminal stenosis. No central canal stenosis.   L2-L3: Mild broad-based disc bulge. Mild bilateral facet arthropathy. No foraminal or central canal stenosis.   L3-L4: No significant disc bulge. Mild bilateral facet arthropathy. No foraminal or central canal stenosis.   L4-L5: No disc herniation. Severe bilateral facet arthropathy. Moderate central canal stenosis. Bilateral lateral recess stenosis. Mild bilateral foraminal stenosis.   L5-S1: No disc herniation. Severe bilateral facet arthropathy. Mild bilateral foraminal stenosis. No central canal stenosis.   IMPRESSION: 1. At L4-5 there is a no disc herniation. Severe bilateral facet arthropathy. Moderate central canal stenosis. Bilateral lateral recess stenosis. Mild bilateral foraminal stenosis. 2. At L5-S1 there is a no disc herniation. Severe bilateral facet arthropathy. Mild bilateral foraminal stenosis. 3. No acute osseous injury of the lumbar spine.     Electronically Signed   By: Elige Ko M.D.   On: 09/21/2023 09:11     Objective:  VS:  HT:    WT:   BMI:     BP:128/83  HR:78bpm  TEMP: ( )  RESP:  Physical Exam Vitals and nursing note reviewed.  Constitutional:      General: She is not in acute distress.    Appearance: Normal appearance. She is not ill-appearing.  HENT:     Head: Normocephalic and atraumatic.     Right Ear: External ear normal.     Left Ear: External ear normal.  Eyes:     Extraocular Movements: Extraocular movements intact.  Cardiovascular:     Rate and Rhythm: Normal rate.     Pulses: Normal pulses.  Pulmonary:     Effort: Pulmonary effort is normal. No respiratory distress.  Abdominal:     General: There is no distension.     Palpations: Abdomen is soft.  Musculoskeletal:        General: Tenderness present.      Cervical back: Neck supple.     Right lower leg: No edema.     Left lower leg: No edema.     Comments: Patient has good distal strength with no pain over the greater trochanters.  No clonus or focal weakness.  Skin:    Findings: No erythema, lesion or rash.  Neurological:     General: No focal deficit present.     Mental Status: She is alert and oriented to person, place, and time.     Sensory: No sensory deficit.     Motor: No weakness or abnormal muscle tone.     Coordination: Coordination normal.  Psychiatric:        Mood and Affect: Mood normal.        Behavior: Behavior normal.      Imaging: No results found.

## 2023-10-28 ENCOUNTER — Encounter: Payer: Self-pay | Admitting: Podiatry

## 2023-10-28 ENCOUNTER — Ambulatory Visit (INDEPENDENT_AMBULATORY_CARE_PROVIDER_SITE_OTHER): Payer: HMO | Admitting: Podiatry

## 2023-10-28 DIAGNOSIS — M79675 Pain in left toe(s): Secondary | ICD-10-CM

## 2023-10-28 DIAGNOSIS — B351 Tinea unguium: Secondary | ICD-10-CM | POA: Diagnosis not present

## 2023-10-28 DIAGNOSIS — M79674 Pain in right toe(s): Secondary | ICD-10-CM

## 2023-10-28 DIAGNOSIS — E1142 Type 2 diabetes mellitus with diabetic polyneuropathy: Secondary | ICD-10-CM

## 2023-10-28 NOTE — Progress Notes (Signed)
 Subjective:  Patient ID: Andrea Montes, female    DOB: 1955-10-01,  MRN: [id number]  Andrea Montes presents to clinic today for at risk foot care with history of diabetic neuropathy and painful elongated mycotic toenails 1-5 bilaterally which are tender when wearing enclosed shoe gear. Pain is relieved with periodic professional debridement. She relates her right great toe is very sore. It has the thickest of her nailplates. States she cannot tolerate much on the toe due to the pain. She will see Dr. Lilian Kapur on next week for f/u oral Lamisil therapy.  New problem(s): None.   PCP is Renford Dills, MD.  Allergies  Allergen Reactions   Lisinopril Other (See Comments), Shortness Of Breath and Cough    wheezing  Other Reaction(s): SOB/Wheezing   Kiwi Extract Itching and Other (See Comments)    Itching in mouth with KIWI, And pineapple juice- topically irritating    Pineapple Flavoring Agent (Non-Screening) Itching    Other reaction(s): Unknown    Review of Systems: Negative except as noted in the HPI.  Objective: No changes noted in today's physical examination. There were no vitals filed for this visit. Andrea Montes is a pleasant 68 y.o. female obese in NAD. AAO x 3.  Vascular Examination: Capillary refill time <3 seconds b/l LE. Palpable pedal pulses b/l LE. Digital hair present b/l. No pedal edema b/l. Skin temperature gradient WNL b/l. No varicosities b/l. No cyanosis or clubbing b/l.  Dermatological Examination: Pedal skin with normal turgor, texture and tone b/l. No open wounds. No interdigital macerations b/l. Toenails 1-5 b/l thickened, discolored, dystrophic with subungual debris. There is pain on palpation to dorsal aspect of nailplates. Area plantarlateral heel pad left foot healing with good epithelialization. No surrounding erythema, no edema, no drainage, no fluctuance.  Incurvated mycotic nailplate right great toe lateral and distal border(s) with tenderness  to palpation. No erythema, no edema, no drainage noted.   Resolved pedal scaling noted b/l.  Neurological Examination: Protective sensation intact with 10 gram monofilament b/l LE. Vibratory sensation intact b/l LE. Pt has subjective symptoms of neuropathy.  Musculoskeletal Examination: Normal muscle strength 5/5 to all lower extremity muscle groups bilaterally. Pes planus deformity noted bilateral LE.[name] No pain, crepitus or joint limitation noted with ROM b/l LE.  Patient ambulates independently without assistive aids.  Assessment/Plan: 1. Pain due to onychomycosis of toenails of both feet   2. Diabetic peripheral neuropathy associated with type 2 diabetes mellitus (HCC)   -Continue foot and shoe inspections daily. Monitor blood glucose per PCP/Endocrinologist's recommendations. -Tinea pedis resolved with oral Lamisil. -Patient to continue soft, supportive shoe gear daily. -Toenails 1-5 b/l were debrided in length and girth with sterile nail nippers and dremel without iatrogenic bleeding.  -No invasive procedure(s) performed. Offending nail border debrided and curretaged right great toe utilizing sterile nail nipper and currette. Border cleansed with alcohol. No further treatment required by patient/caregiver. Call office if there are any concerns. -Patient/POA to call should there be question/concern in the interim.  -She will discuss follow with Dr. Lilian Kapur.  No follow-ups on file.  Freddie Breech, DPM      [facility] LOCATION: [address], [place] [address]  Office [phone]   [facility] LOCATION: [address] [place], [place] [address] Office [phone]

## 2023-10-31 ENCOUNTER — Ambulatory Visit (INDEPENDENT_AMBULATORY_CARE_PROVIDER_SITE_OTHER): Payer: 59 | Admitting: Orthopedic Surgery

## 2023-10-31 DIAGNOSIS — M48062 Spinal stenosis, lumbar region with neurogenic claudication: Secondary | ICD-10-CM | POA: Diagnosis not present

## 2023-10-31 NOTE — Progress Notes (Signed)
 Orthopedic Spine Surgery Office Note   Assessment: Patient is a 68 y.o. female with low back pain that radiates into the bilateral lateral hips and thighs. 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 Symptoms consistent with neurogenic claudication     Plan: -Patient has tried Tylenol, Aleve, medrol dose pak, ESI -Referral provided to her for PT -Patient would need to get to a BMI of 40 or less prior to any elective spine surgery.  Explained the importance of weight loss and that it may also help with her pain -Patient should return to office in 6-8 weeks, x-rays at next visit: none     Patient expressed understanding of the plan and all questions were answered to the patient's satisfaction.    ___________________________________________________________________________     History:   Patient is a 68 y.o. female who presents today for follow up on her lumbar spine.  Patient continues to have low back pain that radiates into the bilateral lateral hips and thighs.  She states that she really only notices it when she is standing or walking.  She says it gets better as soon as she sits down.  She does not have any pain rating past the knees.  Since her last visit, she got an injection and felt that that was helpful.  She said it reduced her pain significantly and has allowed her to do more.  She has not developed any new symptoms she was last seen.   Treatments tried: Tylenol, Aleve, medrol dosepak, ESI     Physical Exam:   General: no acute distress, appears stated age Neurologic: alert, answering questions appropriately, following commands Respiratory: unlabored breathing on room air, symmetric chest rise Psychiatric: appropriate affect, normal cadence to speech     MSK (spine):   -Strength exam                                                   Left                  Right EHL                              5/5                  5/5 TA                                 5/5                   5/5 GSC                             5/5                  5/5 Knee extension            5/5                  5/5 Hip flexion                    5/5                  5/5   -Sensory  exam                           Sensation intact to light touch in L3-S1 nerve distributions of bilateral lower extremities    Imaging: XRs of the lumbar spine from 08/28/2023 were previously independently reviewed and interpreted, showing disc height loss at L4/5 and L5/S1.  Spondylolisthesis seen at L4/5.  No other significant degenerative changes seen.   MRI of the lumbar spine from 09/12/2023 was previously independently reviewed and interpreted, showing central and lateral recess stenosis at L4/5.  Spondylolisthesis seen at L4/5.  Bilateral facet arthropathy at L4/5 and L5/S1.     Patient name: Andrea Montes Patient MRN: [id number] Date of visit: 10/31/23

## 2023-11-05 ENCOUNTER — Ambulatory Visit (INDEPENDENT_AMBULATORY_CARE_PROVIDER_SITE_OTHER): Payer: HMO | Admitting: Podiatry

## 2023-11-05 ENCOUNTER — Encounter: Payer: Self-pay | Admitting: Podiatry

## 2023-11-05 ENCOUNTER — Other Ambulatory Visit (HOSPITAL_COMMUNITY): Payer: Self-pay

## 2023-11-05 DIAGNOSIS — B351 Tinea unguium: Secondary | ICD-10-CM | POA: Diagnosis not present

## 2023-11-05 MED ORDER — TERBINAFINE HCL 250 MG PO TABS
250.0000 mg | ORAL_TABLET | Freq: Every day | ORAL | 0 refills | Status: AC
  Filled 2023-11-05: qty 90, 90d supply, fill #0

## 2023-11-05 NOTE — Progress Notes (Signed)
  Subjective:  Patient ID: Andrea Montes, female    DOB: 04-Jul-1956,  MRN: [id number]  Chief Complaint  Patient presents with   Diabetes    "It's doing better.  The Athlete's Feet is pretty much gone.  I'm still having problems with that toenail."    68 y.o. female presents with the above complaint. History confirmed with patient.  She returns for follow-up she notes quite a bit of improvement had no side effects taking the medication  Objective:  Physical Exam: warm, good capillary refill, no trophic changes or ulcerative lesions, normal DP and PT pulses, normal sensory exam, and she has right hallux onychomycosis with about 50% improvement with proximal clearing still some yellow streaking.      Assessment:   1. Onychomycosis      Plan:  Patient was evaluated and treated and all questions answered.  Onychomycosis -Educated on etiology of nail fungus. -She has had some improvement in her onychomycosis with Lamisil therapy 85-month course so far.  I recommended repeat treatment. -eRx for oral terbinafine #90. Educated on risks and benefits of the medication. -Photographs taken   No follow-ups on file.

## 2023-11-05 NOTE — Progress Notes (Signed)
 Needs appt to review MRI.  I think she had one originally but something happened to it I guess

## 2023-11-12 ENCOUNTER — Ambulatory Visit (INDEPENDENT_AMBULATORY_CARE_PROVIDER_SITE_OTHER): Admitting: Physical Therapy

## 2023-11-12 ENCOUNTER — Encounter: Payer: Self-pay | Admitting: Physical Therapy

## 2023-11-12 DIAGNOSIS — M25552 Pain in left hip: Secondary | ICD-10-CM | POA: Diagnosis not present

## 2023-11-12 DIAGNOSIS — M6281 Muscle weakness (generalized): Secondary | ICD-10-CM

## 2023-11-12 DIAGNOSIS — R262 Difficulty in walking, not elsewhere classified: Secondary | ICD-10-CM | POA: Diagnosis not present

## 2023-11-12 DIAGNOSIS — M25551 Pain in right hip: Secondary | ICD-10-CM

## 2023-11-12 DIAGNOSIS — M5459 Other low back pain: Secondary | ICD-10-CM | POA: Diagnosis not present

## 2023-11-12 NOTE — Therapy (Signed)
 OUTPATIENT PHYSICAL THERAPY THORACOLUMBAR EVALUATION   Patient Name: Andrea Montes MRN: [id number] DOB:09/28/1955, 68 y.o., female Today's Date: 11/12/2023  END OF SESSION:  PT End of Session - 11/12/23 0941     Visit Number 1    Number of Visits 20    Date for PT Re-Evaluation 01/24/24    Authorization Type [employer] Medicare    PT Start Time [phone]    PT Stop Time 1015    PT Time Calculation (min) 38 min    Activity Tolerance Patient tolerated treatment well    Behavior During Therapy WFL for tasks assessed/performed             Past Medical History:  Diagnosis Date   Anemia    no current med.   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   GERD (gastroesophageal reflux disease)    Heart murmur    last  echo- 2010   History of blood transfusion    History of shingles    Hyperlipidemia    Hypertension    borderline not on medicaiton at this time   Impingement syndrome of left shoulder region 10/2014   Joint pain    Migraines    Obesity    Rheumatoid arteritis (HCC)    Seasonal allergies    Sleep apnea    uses CPAP nightly   Trigger finger, left middle finger    Vitamin D deficiency    Wears partial dentures    upper   Past Surgical History:  Procedure Laterality Date   CARPAL TUNNEL RELEASE Left 04/25/2006   CARPAL TUNNEL RELEASE Right 07/15/2018   Procedure: RIGHT CARPAL TUNNEL RELEASE;  Surgeon: Cammy Copa, MD;  Location: [facility]  OR;  Service: Orthopedics;  Laterality: Right;   CLOSED MANIPULATION SHOULDER WITH STERIOD INJECTION Left 02/03/2015   Procedure: LEFT  MANIPULATION SHOULDER UNDER ANESTHESIA WITH INJECTION OF STEROID;  Surgeon: Mckinley Jewel, MD;  Location: [facility];  Service: Orthopedics;  Laterality: Left;   COLONOSCOPY     COLONOSCOPY WITH PROPOFOL  10/09/2011   POLYPECTOMY     RESECTION DISTAL CLAVICAL Left 11/04/2014   Procedure: RESECTION DISTAL CLAVICAL;  Surgeon: Mckinley Jewel, MD;  Location: [facility];  Service: Orthopedics;  Laterality: Left;   ROTATOR CUFF REPAIR  2010   SHOULDER ARTHROSCOPY WITH ROTATOR CUFF REPAIR Right 10/04/2008   SHOULDER ARTHROSCOPY WITH SUBACROMIAL DECOMPRESSION, ROTATOR CUFF REPAIR AND BICEP TENDON REPAIR Left 11/04/2014   Procedure: LEFT SHOULDER ARTHROSCOPY WITH DEBRIDEMENT, DISTAL CLAVICLE EXCISION, ACROMIOPLASTY, ROTATOR CUFF REPAIR AND BICEP TENODESIS;  Surgeon: Mckinley Jewel, MD;  Location: [facility];  Service: Orthopedics;  Laterality: Left;   TRIGGER FINGER RELEASE Right 02/03/2015   Procedure: RIGHT LONG FINGER TRIGGER RELEASE;  Surgeon: Mckinley Jewel, MD;  Location: [facility];  Service: Orthopedics;  Laterality: Right;   TRIGGER FINGER RELEASE  Left 10/10/2017   Procedure: LEFT HAND RELEASE TRIGGER FINGER 3RD LONG FINGER AND CYST EXCISION;  Surgeon: Cammy Copa, MD;  Location: [facility] OR;  Service: Orthopedics;  Laterality: Left;   TRIGGER FINGER RELEASE Right 02/16/2021   Procedure: TRIGGER FINGER RELEASE RIGHT RING FINGER;  Surgeon: Cammy Copa, MD;  Location: [facility] OR;  Service: Orthopedics;  Laterality: Right;   TUBAL LIGATION     VAGINAL HYSTERECTOMY  2001   partial   Patient Active Problem List   Diagnosis Date Noted   Urinary symptom or sign 03/12/2023   Hirsutism 07/20/2022   Hyperglycemia due to type 2 diabetes mellitus (HCC) 07/20/2022   Type 2 diabetes mellitus without complication,  without long-term current use of insulin (HCC) 10/01/2018   Trigger finger, right ring finger 09/09/2015   Low back pain radiating to both legs 05/06/2012   Leg length discrepancy 05/06/2012   HYPERTENSION, BENIGN 04/11/2009   LEG EDEMA, BILATERAL 10/08/2008   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 10/17/2006   Carpal tunnel syndrome, right upper limb 10/17/2006    PCP: Renford Dills, MD   REFERRING PROVIDER: London Sheer, MD   REFERRING DIAG:  Diagnosis  [phone] (ICD-10-CM) - Lumbar stenosis with neurogenic claudication    Rationale for Evaluation and Treatment: Rehabilitation  THERAPY DIAG:  Other low back pain  Pain in left hip  Pain in right hip  Difficulty in walking, not elsewhere classified  Muscle weakness (generalized)  ONSET DATE: 4-5 months ago after a fall due to knee buckling  SUBJECTIVE:  SUBJECTIVE STATEMENT: Pt stating she fell due to her left knee buckling about 4-5 months ago. Pt stating her back pain and bil hip pain has increased since the fall.    PERTINENT HISTORY:  See PMH above  PAIN:  NPRS scale: 4-5/10, worse pain 10/10 Pain location: low back and bil hips Pain description: achy, throbbing, numbness Aggravating factors: sitting too long, transfers Relieving factors: ice/heat, pain meds muscle relaxers  PRECAUTIONS: None  WEIGHT BEARING RESTRICTIONS: No  FALLS:  Has patient fallen in last 6 months? Yes. Number of falls 1, pt  stating her left knee buckled.   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  Spondylolisthesis seen at L4/5.  No other significant degenerative changes seen.   MRI of the lumbar spine from 09/12/2023 was previously independently reviewed and interpreted, showing central and lateral recess stenosis at L4/5.  Spondylolisthesis seen at L4/5.  Bilateral facet arthropathy at L4/5 and L5/S1.  PATIENT SURVEYS:  Patient-Specific Activity Scoring Scheme  "0" represents "unable to perform." "10" represents "able to perform at prior level. 0 1 2 3 4 5 6 7 8 9  10 (Date and Score)   Activity Eval  11/12/23    1. walking  5    2. Getting out of chair  4    3. Getting out of bed 3   4.    5.    Score 12/3= 4    Total score = sum of the activity scores/number of activities Minimum detectable change (90%CI) for average score = 2 points Minimum detectable change (90%CI) for single activity score = 3 points  SCREENING FOR RED FLAGS: Bowel or bladder incontinence: No Cauda equina syndrome: No  COGNITION: Overall cognitive status: WFL normal      SENSATION: WFL    POSTURE:  Rounded shoulders, forward head, increased lumbar lordosis  PALPATION: TTP:   LUMBAR ROM:   Directional Preference Assessment: Centralization: Peripheralization:   AROM Eval 11/12/23  Flexion 30  Extension 22 c pain  Right lateral flexion 25  c pain  Left lateral flexion 12 tingling down left LE  Right rotation Limited  80% c pain  Left rotation Limited 80% c pain    (Blank rows = not tested)  LOWER EXTREMITY ROM:      Right 11/12/23 Supine active  Left 11/12/23 Supine active  Hip flexion 88 84  Hip extension    Hip abduction    Hip adduction    Hip internal rotation    Hip external rotation    Knee flexion 110 105  Knee extension     (Blank rows = not tested)  LOWER EXTREMITY MMT:    MMT Right Eval Sitting  Left eval  Hip flexion 19.5 ppsi 17.6 ppsi  Hip extension    Hip abduction    Hip adduction    Hip internal rotation    Hip external rotation    Knee flexion 15.3 ppsi 19.9 ppsi  Knee extension 25.2 ppsi 14.6 ppsi    (Blank rows = not tested)  LUMBAR  SPECIAL TESTS:  Slump test: Positive left  FUNCTIONAL TESTS:  11/12/23 30 seconds chair stand test:   GAIT: Wide BOS, decreased heel strike bilaterally                                                                                                                                                                                                                   TODAY'S TREATMENT:                                                                                                         DATE: 11/12/23  Therex: HEP instruction/performance c cues for techniques, handout provided.  Trial set performed of each for comprehension and symptom assessment.  See below for exercise list Self Care:  Pt edu in log rolling to assist with bed mobility.    PATIENT EDUCATION:  Education details: HEP, POC Person educated: Patient Education method: Programmer, multimedia, Demonstration, Verbal cues, and Handouts Education comprehension: verbalized understanding, returned demonstration, and verbal cues required  HOME EXERCISE PROGRAM: Access Code: [email or web address] URL: [email or web address] Date: 11/12/2023 Prepared by: Narda Amber  Exercises - Supine Lower Trunk Rotation  - 2 x daily - 7 x weekly - 4 reps - 30 seconds hold - Supine Bridge  - 2  x daily - 7 x weekly - 10 reps - Seated Hamstring Stretch  - 2 x daily - 7 x weekly - 2 sets - 3 reps - 30 seconds hold - Seated Small Alternating Straight Leg Lifts with Heel Touch  - 2 x daily - 7 x weekly - 2 sets - 10 reps  ASSESSMENT:  CLINICAL IMPRESSION: Patient is a 68 y.o. who comes to clinic with complaints of low back and bilateral pain with mobility, strength and movement coordination deficits that impair their ability to perform usual daily and recreational functional activities without increase difficulty/symptoms at this time.  Patient to benefit from skilled PT services to address impairments and limitations to improve to previous level of function without restriction secondary to condition.   OBJECTIVE IMPAIRMENTS: decreased mobility, difficulty walking, decreased ROM, decreased  strength, impaired flexibility, obesity, and pain.   ACTIVITY LIMITATIONS: lifting, bending, sitting, standing, squatting, stairs, transfers, and bed mobility  PARTICIPATION LIMITATIONS: cleaning, laundry, community activity, and occupation  PERSONAL FACTORS: 3+ comorbidities: see PMH above  are also affecting patient's functional outcome.   REHAB POTENTIAL: Good  CLINICAL DECISION MAKING: Stable/uncomplicated  EVALUATION COMPLEXITY: Low   GOALS: Goals reviewed with patient? Yes  SHORT TERM GOALS: (target date for Short term goals are 3 weeks 12/03/2023)  1. Patient will demonstrate independent use of home exercise program to maintain progress from in clinic treatments.  Goal status: New  LONG TERM GOALS: (target dates for all long term goals are 10 weeks  01/24/2024)   1. Patient will demonstrate/report pain at worst less than or equal to 2/10 to facilitate minimal limitation in daily activity secondary to pain symptoms.  Goal status: New   2. Patient will demonstrate independent use of home exercise program to facilitate ability to maintain/progress functional gains from skilled physical  therapy services.  Goal status: New   3. Patient will demonstrate Patient specific functional scale avg > or = 6 to indicate reduced disability due to condition.   Goal status: New   4. Patient will demonstrate lumbar extension 100 % WFL s symptoms to facilitate upright standing, walking posture at PLOF s limitation.  Goal status: New   5.  Pt will be able to perform 5 time sit to stand in </= 15 seconds with/without UE support.  Goal status: New   6.  Pt will improve her trunk rotation by 25% bilaterally.  Goal status: New   PLAN:  PT FREQUENCY: 1-2x/week  PT DURATION: 10 weeks  PLANNED INTERVENTIONS: Can include 16109- PT Re-evaluation, 97110-Therapeutic exercises, 97530- Therapeutic activity, 97112- Neuromuscular re-education, [phone]- Self Care, 97140- Manual therapy, [phone]- Gait training, [phone]- Orthotic Fit/training, [phone]- Canalith repositioning, [id number]- Aquatic Therapy, [phone]- Electrical stimulation (unattended), 97750 Physical performance testing, [id number]- Electrical stimulation (manual), 97016- Vasopneumatic device, [id number]- Ultrasound, [id number]- Traction (mechanical), [id number]- Ionotophoresis 4mg /ml Dexamethasone, Patient/Family education, Balance training, Stair training, Taping, Dry Needling, Joint mobilization, Joint manipulation, Spinal manipulation, Spinal mobilization, Scar mobilization, Vestibular training, Visual/preceptual remediation/compensation, DME instructions, Cryotherapy, and Moist heat.  All performed as medically necessary.  All included unless contraindicated  PLAN FOR NEXT SESSION: Review HEP knowledge/results. Core strengthening, lumbar stretching, consider DN to lumbar paraspinals and glutes (handout issued at eval)      Sharmon Leyden, PT, MPT 11/12/2023, 12:52 PM  Date of referral: 10/31/23 Referring provider: London Sheer, MD  Referring diagnosis?  Diagnosis  M48.062 (ICD-10-CM) - Lumbar stenosis with neurogenic claudication   Treatment  diagnosis? (if different than referring diagnosis) M54.59, M25.552, M25.551, R26.2, M62.81  What was this (referring dx) caused by? Thana Ates of Condition: Initial Onset (within last 3 months)   Laterality: Both  Current Functional Measure Score: Other Patient specific activity score (4)  Objective measurements identify impairments when they are compared to normal values, the uninvolved extremity, and prior level of function.  [x]  Yes  []  No  Objective assessment of functional ability: Moderate functional limitations   Briefly describe symptoms: pain in low back, bilateral hips, c h/o fall and knee buckling  How did symptoms start: fall 4-5 months ago  Average pain intensity:  Last 24 hours: 5/10  Past week: can reach 10/10   How often does the pt experience symptoms? Constantly  How much have the symptoms interfered with usual daily activities? Moderately  How has condition changed since care began  at this facility? A little worse  In general, how is the patients overall health? Fair   BACK PAIN (STarT Back Screening Tool) Has pain spread down the leg(s) at some time in the last 2 weeks? Yes left leg Has there been pain in the shoulder or neck at some time in the last 2 weeks? Yes Has the pt only walked short distances because of back pain? yes Has patient dressed more slowly because of back pain in the past 2 weeks? yes Does patient think it's not safe for a person with this condition to be physically active? no Does patient have worrying thoughts a lot of the time? no Does patient feel back pain is terrible and will never get any better? no Has patient stopped enjoying things they usually enjoy? yes

## 2023-11-12 NOTE — Patient Instructions (Signed)

## 2023-11-14 ENCOUNTER — Other Ambulatory Visit: Payer: Self-pay

## 2023-11-15 ENCOUNTER — Ambulatory Visit (INDEPENDENT_AMBULATORY_CARE_PROVIDER_SITE_OTHER): Admitting: Rehabilitative and Restorative Service Providers"

## 2023-11-15 ENCOUNTER — Other Ambulatory Visit (HOSPITAL_COMMUNITY): Payer: Self-pay

## 2023-11-15 ENCOUNTER — Encounter: Payer: Self-pay | Admitting: Rehabilitative and Restorative Service Providers"

## 2023-11-15 DIAGNOSIS — M25551 Pain in right hip: Secondary | ICD-10-CM

## 2023-11-15 DIAGNOSIS — M5459 Other low back pain: Secondary | ICD-10-CM

## 2023-11-15 DIAGNOSIS — M25552 Pain in left hip: Secondary | ICD-10-CM | POA: Diagnosis not present

## 2023-11-15 DIAGNOSIS — R262 Difficulty in walking, not elsewhere classified: Secondary | ICD-10-CM | POA: Diagnosis not present

## 2023-11-15 DIAGNOSIS — M6281 Muscle weakness (generalized): Secondary | ICD-10-CM | POA: Diagnosis not present

## 2023-11-15 NOTE — Therapy (Signed)
 OUTPATIENT PHYSICAL THERAPY THORACOLUMBAR TREATMENT   Patient Name: Andrea Montes MRN: [id number] DOB:15-Aug-1956, 68 y.o., female Today's Date: 11/15/2023  END OF SESSION:  PT End of Session - 11/15/23 1300     Visit Number 2    Number of Visits 20    Date for PT Re-Evaluation 01/24/24    Authorization Type [employer] Medicare    PT Start Time 1300    PT Stop Time 1341    PT Time Calculation (min) 41 min    Activity Tolerance Patient tolerated treatment well;No increased pain;Patient limited by pain    Behavior During Therapy [facility] for tasks assessed/performed              Past Medical History:  Diagnosis Date   Anemia    no current med.   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   GERD (gastroesophageal reflux disease)    Heart murmur    last  echo- 2010   History of blood transfusion    History of shingles    Hyperlipidemia    Hypertension    borderline not on medicaiton at this time   Impingement syndrome of left shoulder region 10/2014   Joint pain    Migraines    Obesity    Rheumatoid arteritis (HCC)    Seasonal allergies    Sleep apnea    uses CPAP nightly   Trigger finger, left middle finger    Vitamin D deficiency    Wears partial dentures    upper   Past Surgical History:  Procedure Laterality Date   CARPAL TUNNEL RELEASE Left 04/25/2006   CARPAL TUNNEL RELEASE Right 07/15/2018   Procedure: RIGHT CARPAL TUNNEL RELEASE;   Surgeon: Cammy Copa, MD;  Location: [facility] OR;  Service: Orthopedics;  Laterality: Right;   CLOSED MANIPULATION SHOULDER WITH STERIOD INJECTION Left 02/03/2015   Procedure: LEFT  MANIPULATION SHOULDER UNDER ANESTHESIA WITH INJECTION OF STEROID;  Surgeon: Mckinley Jewel, MD;  Location: [facility];  Service: Orthopedics;  Laterality: Left;   COLONOSCOPY     COLONOSCOPY WITH PROPOFOL  10/09/2011   POLYPECTOMY     RESECTION DISTAL CLAVICAL Left 11/04/2014   Procedure: RESECTION DISTAL CLAVICAL;  Surgeon: Mckinley Jewel, MD;  Location: [facility];  Service: Orthopedics;  Laterality: Left;   ROTATOR CUFF REPAIR  2010   SHOULDER ARTHROSCOPY WITH ROTATOR CUFF REPAIR Right 10/04/2008   SHOULDER ARTHROSCOPY WITH SUBACROMIAL DECOMPRESSION, ROTATOR CUFF REPAIR AND BICEP TENDON REPAIR Left 11/04/2014   Procedure: LEFT SHOULDER ARTHROSCOPY WITH DEBRIDEMENT, DISTAL CLAVICLE EXCISION, ACROMIOPLASTY, ROTATOR CUFF REPAIR AND BICEP TENODESIS;  Surgeon: Mckinley Jewel, MD;  Location: [facility];  Service: Orthopedics;  Laterality: Left;   TRIGGER FINGER RELEASE Right 02/03/2015   Procedure: RIGHT LONG FINGER TRIGGER RELEASE;  Surgeon: Mckinley Jewel, MD;  Location:  SURGERY CENTER;  Service: Orthopedics;  Laterality:  Right;   TRIGGER FINGER RELEASE Left 10/10/2017   Procedure: LEFT HAND RELEASE TRIGGER FINGER 3RD LONG FINGER AND CYST EXCISION;  Surgeon: Cammy Copa, MD;  Location: [facility] OR;  Service: Orthopedics;  Laterality: Left;   TRIGGER FINGER RELEASE Right 02/16/2021   Procedure: TRIGGER FINGER RELEASE RIGHT RING FINGER;  Surgeon: Cammy Copa, MD;  Location: [facility];  Service: Orthopedics;  Laterality: Right;   TUBAL LIGATION     VAGINAL HYSTERECTOMY  2001   partial   Patient Active Problem List   Diagnosis Date Noted   Urinary symptom or sign 03/12/2023   Hirsutism 07/20/2022   Hyperglycemia due to type 2 diabetes mellitus (HCC) 07/20/2022    Type 2 diabetes mellitus without complication, without long-term current use of insulin (HCC) 10/01/2018   Trigger finger, right ring finger 09/09/2015   Low back pain radiating to both legs 05/06/2012   Leg length discrepancy 05/06/2012   HYPERTENSION, BENIGN 04/11/2009   LEG EDEMA, BILATERAL 10/08/2008   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 10/17/2006   Carpal tunnel syndrome, right upper limb 10/17/2006    PCP: Andrea Dills, MD   REFERRING PROVIDER: London Sheer, MD   REFERRING DIAG:  Diagnosis  [phone] (ICD-10-CM) - Lumbar stenosis with neurogenic claudication    Rationale for Evaluation and Treatment: Rehabilitation  THERAPY DIAG:  Other low back pain  Pain in left hip  Pain in right hip  Difficulty in walking, not elsewhere classified  Muscle weakness (generalized)  ONSET DATE: 4-5 months ago after a fall due to knee buckling  SUBJECTIVE:  SUBJECTIVE STATEMENT: Andrea Montes had a fall 6 months ago on her left side.  She had a previous epidural on the right side that lasted a few weeks.  She notes back tightness, left gluteal and posterior thigh to the knee ache, tightness, pulling.  Pt stating she fell due to her left knee buckling about 4-5 months ago. Pt stating her back pain and bil hip pain has increased since the fall.    PERTINENT HISTORY:  See PMH above  PAIN:  NPRS scale: 4-5/10, worse pain 10/10 Pain location: low back and bil hips Pain  description: achy, throbbing, numbness Aggravating factors: sitting too long, transfers, walking too much (overuse) Relieving factors: epidural, ice/heat, pain meds, muscle relaxers  PRECAUTIONS: None  WEIGHT BEARING RESTRICTIONS: No  FALLS:  Has patient fallen in last 6 months? Yes. Number of falls 1, pt stating her left knee buckled.   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  Spondylolisthesis seen at L4/5.  No other significant degenerative changes seen.   MRI of the lumbar spine from 09/12/2023 was previously independently reviewed and interpreted, showing central and lateral recess stenosis at L4/5.  Spondylolisthesis seen at L4/5.  Bilateral facet arthropathy at L4/5 and L5/S1.  PATIENT SURVEYS:  Patient-Specific Activity Scoring Scheme  "0" represents "unable to perform." "10" represents "able to perform at prior level. 0 1 2 3 4 5 6 7 8 9  10 (Date and Score)   Activity Eval  11/12/23    1. walking  5    2. Getting out of chair  4    3. Getting out of bed 3   4.    5.    Score 12/3= 4    Total score = sum of the activity scores/number of activities Minimum detectable change (90%CI) for average score = 2 points Minimum detectable change (90%CI) for single activity score = 3 points  SCREENING FOR RED FLAGS: Bowel or bladder incontinence: No Cauda equina syndrome: No  COGNITION: Overall cognitive status: WFL normal      SENSATION: WFL    POSTURE:  Rounded shoulders,  forward head, increased lumbar lordosis  PALPATION: TTP:   LUMBAR ROM:   Directional Preference Assessment: Centralization: Peripheralization:   AROM Eval 11/12/23  Flexion 30  Extension 22 c pain  Right lateral flexion 25  c pain  Left lateral flexion 12 tingling down left LE  Right rotation Limited 80% c pain  Left rotation Limited 80% c pain    (Blank rows = not tested)  LOWER EXTREMITY ROM:      Right 11/12/23 Supine active  Left 11/12/23 Supine active  Hip flexion 88 84  Hip extension    Hip abduction    Hip adduction    Hip internal rotation    Hip external rotation    Knee flexion 110 105  Knee extension     (Blank rows = not tested)  LOWER EXTREMITY MMT:    MMT Right Eval Sitting  Left eval  Hip flexion 19.5 ppsi 17.6 ppsi  Hip extension  Hip abduction    Hip adduction    Hip internal rotation    Hip external rotation    Knee flexion 15.3 ppsi 19.9 ppsi  Knee extension 25.2 ppsi 14.6 ppsi    (Blank rows = not tested)  LUMBAR SPECIAL TESTS:  Slump test: Positive left  FUNCTIONAL TESTS:  11/12/23 30 seconds chair stand test:   GAIT: Wide BOS, decreased heel strike bilaterally                                                                                                                                                                                                                   TODAY'S TREATMENT:                                                                                                         DATE:  11/15/2023 Supine lower trunk rotation 4 x 30 seconds Yoga Bridge 2 sets of 10 Seated hamstrings stretch (good posture, slight bend in the knee) 4 x 30 seconds Supine hamstrings stretch 4 x 20 seconds (OK to do seated only with good posture) Seated straight leg raises 2 sets of 10  Functional Activities: Log roll for bed mobility, review spine anatomy with model, review imaging Lumbar extension in standing (hands on gluteals) 10 x 3  seconds (postural correction) Shoulder blade pinches 10 x 5 seconds (postural correction)   11/12/23  Therex: HEP instruction/performance c cues for techniques, handout provided.  Trial set performed of each for comprehension and symptom assessment.  See below for exercise list Self Care:  Pt edu in log rolling to assist with bed mobility.    PATIENT EDUCATION:  Education details: HEP, POC Person educated: Patient Education method: Programmer, multimedia, Demonstration, Verbal cues, and Handouts Education comprehension: verbalized understanding, returned demonstration, and verbal cues required  HOME EXERCISE PROGRAM: Access Code: [email or web address] URL: [email or web address] Date: 11/15/2023 Prepared by: Pauletta Browns  Exercises - Supine Lower Trunk Rotation  - 2 x daily - 7 x weekly - 4 reps - 30 seconds hold - Supine Bridge  - 2 x daily -  7 x weekly - 10 reps - 1-10 seconds  hold - Seated Hamstring Stretch  - 2 x daily - 7 x weekly - 2 sets - 3 reps - 30 seconds hold - Seated Small Alternating Straight Leg Lifts with Heel Touch  - 2 x daily - 7 x weekly - 2 sets - 10 reps - Standing Lumbar Extension at Wall - Forearms  - 5 x daily - 7 x weekly - 1 sets - 5 reps - 3 seconds hold - Standing Scapular Retraction  - 5 x daily - 7 x weekly - 1 sets - 5 reps - 5 second hold  ASSESSMENT:  CLINICAL IMPRESSION: Wayne did a good job with her recall and technique of her day 1 home exercise program.  She also was able to correctly demonstrate correct technique with logroll for bed mobility.  Additional time was spent on posture and body mechanics education along with addition of 2 postural correction exercises to complement her day 1 program.  OBJECTIVE IMPAIRMENTS: decreased mobility, difficulty walking, decreased ROM, decreased strength, impaired flexibility, obesity, and pain.   ACTIVITY LIMITATIONS: lifting, bending, sitting, standing, squatting, stairs, transfers, and bed  mobility  PARTICIPATION LIMITATIONS: cleaning, laundry, community activity, and occupation  PERSONAL FACTORS: 3+ comorbidities: see PMH above  are also affecting patient's functional outcome.   REHAB POTENTIAL: Good  CLINICAL DECISION MAKING: Stable/uncomplicated  EVALUATION COMPLEXITY: Low   GOALS: Goals reviewed with patient? Yes  SHORT TERM GOALS: (target date for Short term goals are 3 weeks 12/03/2023)  1. Patient will demonstrate independent use of home exercise program to maintain progress from in clinic treatments.  Goal status: Met 11/15/2023  LONG TERM GOALS: (target dates for all long term goals are 10 weeks  01/24/2024)   1. Patient will demonstrate/report pain at worst less than or equal to 2/10 to facilitate minimal limitation in daily activity secondary to pain symptoms.  Goal status: New   2. Patient will demonstrate independent use of home exercise program to facilitate ability to maintain/progress functional gains from skilled physical therapy services.  Goal status: New   3. Patient will demonstrate Patient specific functional scale avg > or = 6 to indicate reduced disability due to condition.   Goal status: New   4. Patient will demonstrate lumbar extension 100 % WFL s symptoms to facilitate upright standing, walking posture at PLOF s limitation.  Goal status: New   5.  Pt will be able to perform 5 time sit to stand in </= 15 seconds with/without UE support.  Goal status: New   6.  Pt will improve her trunk rotation by 25% bilaterally.  Goal status: New   PLAN:  PT FREQUENCY: 1-2x/week  PT DURATION: 10 weeks  PLANNED INTERVENTIONS: Can include 16109- PT Re-evaluation, 97110-Therapeutic exercises, 97530- Therapeutic activity, [id number]- Neuromuscular re-education, 97535- Self Care, 97140- Manual therapy, [phone]- Gait training, [phone]- Orthotic Fit/training, [phone]- Canalith repositioning, [id number]- Aquatic Therapy, [phone]- Electrical stimulation (unattended),  97750 Physical performance testing, [id number]- Electrical stimulation (manual), 97016- Vasopneumatic device, [id number]- Ultrasound, [id number]- Traction (mechanical), [id number]- Ionotophoresis 4mg /ml Dexamethasone, Patient/Family education, Balance training, Stair training, Taping, Dry Needling, Joint mobilization, Joint manipulation, Spinal manipulation, Spinal mobilization, Scar mobilization, Vestibular training, Visual/preceptual remediation/compensation, DME instructions, Cryotherapy, and Moist heat.  All performed as medically necessary.  All included unless contraindicated  PLAN FOR NEXT SESSION: Review HEP knowledge/results.  Core/lumbar strengthening, posture and body mechanics work, consider DN to lumbar paraspinals and glutes (handout issued at eval)      Rob  Rocco Pauls, PT, MPT 11/15/2023, 5:20 PM  Date of referral: 10/31/23 Referring provider: London Sheer, MD  Referring diagnosis?  Diagnosis  M48.062 (ICD-10-CM) - Lumbar stenosis with neurogenic claudication   Treatment diagnosis? (if different than referring diagnosis) M54.59, M25.552, M25.551, R26.2, M62.81  What was this (referring dx) caused by? Thana Ates of Condition: Initial Onset (within last 3 months)   Laterality: Both  Current Functional Measure Score: Other Patient specific activity score (4)  Objective measurements identify impairments when they are compared to normal values, the uninvolved extremity, and prior level of function.  [x]  Yes  []  No  Objective assessment of functional ability: Moderate functional limitations   Briefly describe symptoms: pain in low back, bilateral hips, c h/o fall and knee buckling  How did symptoms start: fall 4-5 months ago  Average pain intensity:  Last 24 hours: 5/10  Past week: can reach 10/10   How often does the pt experience symptoms? Constantly  How much have the symptoms interfered with usual daily activities? Moderately  How has condition changed since care began at  this facility? A little worse  In general, how is the patients overall health? Fair   BACK PAIN (STarT Back Screening Tool) Has pain spread down the leg(s) at some time in the last 2 weeks? Yes left leg Has there been pain in the shoulder or neck at some time in the last 2 weeks? Yes Has the pt only walked short distances because of back pain? yes Has patient dressed more slowly because of back pain in the past 2 weeks? yes Does patient think it's not safe for a person with this condition to be physically active? no Does patient have worrying thoughts a lot of the time? no Does patient feel back pain is terrible and will never get any better? no Has patient stopped enjoying things they usually enjoy? yes

## 2023-11-19 ENCOUNTER — Ambulatory Visit (INDEPENDENT_AMBULATORY_CARE_PROVIDER_SITE_OTHER): Admitting: Physical Therapy

## 2023-11-19 ENCOUNTER — Encounter: Payer: Self-pay | Admitting: Physical Therapy

## 2023-11-19 DIAGNOSIS — M6281 Muscle weakness (generalized): Secondary | ICD-10-CM

## 2023-11-19 DIAGNOSIS — R262 Difficulty in walking, not elsewhere classified: Secondary | ICD-10-CM

## 2023-11-19 DIAGNOSIS — M25551 Pain in right hip: Secondary | ICD-10-CM | POA: Diagnosis not present

## 2023-11-19 DIAGNOSIS — M5459 Other low back pain: Secondary | ICD-10-CM | POA: Diagnosis not present

## 2023-11-19 DIAGNOSIS — M25552 Pain in left hip: Secondary | ICD-10-CM | POA: Diagnosis not present

## 2023-11-19 NOTE — Therapy (Signed)
 OUTPATIENT PHYSICAL THERAPY THORACOLUMBAR TREATMENT   Patient Name: Andrea Montes MRN: [id number] DOB:04-14-1956, 68 y.o., female Today's Date: 11/19/2023  END OF SESSION:  PT End of Session - 11/19/23 0814     Visit Number 3    Number of Visits 20    Date for PT Re-Evaluation 01/24/24    Authorization Type [employer] Medicare    PT Start Time 0802    PT Stop Time 0847    PT Time Calculation (min) 45 min    Activity Tolerance Patient tolerated treatment well;No increased pain;Patient limited by pain    Behavior During Therapy [facility] for tasks assessed/performed               Past Medical History:  Diagnosis Date   Anemia    no current med.   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   GERD (gastroesophageal reflux disease)    Heart murmur    last  echo- 2010   History of blood transfusion    History of shingles    Hyperlipidemia    Hypertension    borderline not on medicaiton at this time   Impingement syndrome of left shoulder region 10/2014   Joint pain    Migraines    Obesity    Rheumatoid arteritis (HCC)    Seasonal allergies    Sleep apnea    uses CPAP nightly   Trigger finger, left middle finger    Vitamin D deficiency    Wears partial dentures    upper   Past Surgical History:  Procedure Laterality Date   CARPAL TUNNEL RELEASE Left 04/25/2006   CARPAL TUNNEL RELEASE Right 07/15/2018   Procedure: RIGHT CARPAL TUNNEL RELEASE;   Surgeon: Andrea Copa, MD;  Location: [facility];  Service: Orthopedics;  Laterality: Right;   CLOSED MANIPULATION SHOULDER WITH STERIOD INJECTION Left 02/03/2015   Procedure: LEFT  MANIPULATION SHOULDER UNDER ANESTHESIA WITH INJECTION OF STEROID;  Surgeon: Andrea Jewel, MD;  Location: [facility];  Service: Orthopedics;  Laterality: Left;   COLONOSCOPY     COLONOSCOPY WITH PROPOFOL  10/09/2011   POLYPECTOMY     RESECTION DISTAL CLAVICAL Left 11/04/2014   Procedure: RESECTION DISTAL CLAVICAL;  Surgeon: Andrea Jewel, MD;  Location: [facility];  Service: Orthopedics;  Laterality: Left;   ROTATOR CUFF REPAIR  2010   SHOULDER ARTHROSCOPY WITH ROTATOR CUFF REPAIR Right 10/04/2008   SHOULDER ARTHROSCOPY WITH SUBACROMIAL DECOMPRESSION, ROTATOR CUFF REPAIR AND BICEP TENDON REPAIR Left 11/04/2014   Procedure: LEFT SHOULDER ARTHROSCOPY WITH DEBRIDEMENT, DISTAL CLAVICLE EXCISION, ACROMIOPLASTY, ROTATOR CUFF REPAIR AND BICEP TENODESIS;  Surgeon: Andrea Jewel, MD;  Location: [facility];  Service: Orthopedics;  Laterality: Left;   TRIGGER FINGER RELEASE Right 02/03/2015   Procedure: RIGHT LONG FINGER TRIGGER RELEASE;  Surgeon: Andrea Jewel, MD;  Location: [facility];  Service: Orthopedics;  Laterality: Right;   TRIGGER FINGER RELEASE Left 10/10/2017   Procedure: LEFT HAND RELEASE TRIGGER FINGER 3RD LONG FINGER AND CYST EXCISION;  Surgeon: Andrea Copa, MD;  Location: [facility];  Service: Orthopedics;  Laterality: Left;   TRIGGER FINGER RELEASE Right 02/16/2021   Procedure: TRIGGER FINGER RELEASE RIGHT RING FINGER;  Surgeon: Andrea Copa, MD;  Location: [facility] OR;  Service: Orthopedics;  Laterality: Right;   TUBAL LIGATION     VAGINAL HYSTERECTOMY  2001   partial   Patient Active Problem List   Diagnosis Date Noted   Urinary symptom or sign 03/12/2023   Hirsutism 07/20/2022   Hyperglycemia due to type 2 diabetes mellitus (HCC) 07/20/2022    Type 2 diabetes mellitus without complication, without long-term current use of insulin (HCC) 10/01/2018   Trigger finger, right ring finger 09/09/2015   Low back pain radiating to both legs 05/06/2012   Leg length discrepancy 05/06/2012   HYPERTENSION, BENIGN 04/11/2009   LEG EDEMA, BILATERAL 10/08/2008   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 10/17/2006   Carpal tunnel syndrome, right upper limb 10/17/2006    PCP: Andrea Dills, MD   REFERRING PROVIDER: London Sheer, MD   REFERRING DIAG:  Diagnosis  [phone] (ICD-10-CM) - Lumbar stenosis with neurogenic claudication    Rationale for Evaluation and Treatment: Rehabilitation  THERAPY DIAG:  Other low back pain  Pain in left hip  Pain in right hip  Difficulty in walking, not elsewhere classified  Muscle weakness (generalized)  ONSET DATE: 4-5 months ago after a fall due to knee buckling  SUBJECTIVE:  SUBJECTIVE STATEMENT: Andrea Montes arriving today reporting tightness in her low back and spasms down her left LE. Pt stating her pain was so bad yesterday that she couldn't get off the couch.     PERTINENT HISTORY:  See PMH above  PAIN:  NPRS scale: 5/10 today , worse pain 10/10 Pain location: low back and bil hips Pain description: achy, throbbing, numbness Aggravating factors: sitting too long, transfers, walking too much (overuse) Relieving factors: epidural, ice/heat, pain meds, muscle relaxers  PRECAUTIONS:  None  WEIGHT BEARING RESTRICTIONS: No  FALLS:  Has patient fallen in last 6 months? Yes. Number of falls 1, pt stating her left knee buckled.   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  Spondylolisthesis seen at L4/5.  No other significant degenerative changes seen.   MRI of the lumbar spine from 09/12/2023 was previously independently reviewed and interpreted, showing central and lateral recess stenosis at L4/5.  Spondylolisthesis seen at L4/5.  Bilateral facet arthropathy at L4/5 and L5/S1.  PATIENT SURVEYS:  Patient-Specific Activity Scoring Scheme  "0" represents "unable to perform." "10" represents "able to perform at prior level. 0 1 2 3 4 5 6 7 8 9  10 (Date and Score)   Activity Eval  11/12/23    1. walking  5    2. Getting out of chair  4    3. Getting out of bed 3   4.    5.    Score 12/3= 4    Total score = sum of the activity scores/number of activities Minimum detectable change (90%CI) for average score = 2 points Minimum detectable change (90%CI) for single activity score = 3 points  SCREENING FOR RED FLAGS: Bowel or bladder incontinence: No Cauda equina syndrome: No  COGNITION: Overall cognitive status: WFL normal      SENSATION: WFL    POSTURE:  Rounded shoulders, forward head, increased lumbar lordosis  PALPATION: TTP:   LUMBAR ROM:   Directional Preference Assessment: Centralization: Peripheralization:   AROM Eval 11/12/23  Flexion 30  Extension 22  c pain  Right lateral flexion 25  c pain  Left lateral flexion 12 tingling down left LE  Right rotation Limited 80% c pain  Left rotation Limited 80% c pain    (Blank rows = not tested)  LOWER EXTREMITY ROM:      Right 11/12/23 Supine active  Left 11/12/23 Supine active  Hip flexion 88 84  Hip extension    Hip abduction    Hip adduction    Hip internal rotation    Hip external rotation    Knee flexion 110 105  Knee extension     (Blank rows = not tested)  LOWER EXTREMITY MMT:    MMT Right Eval Sitting  Left eval  Hip flexion 19.5 ppsi 17.6 ppsi  Hip extension    Hip abduction    Hip adduction    Hip internal rotation    Hip external rotation    Knee flexion 15.3 ppsi 19.9 ppsi  Knee extension 25.2 ppsi 14.6 ppsi    (  Blank rows = not tested)  LUMBAR SPECIAL TESTS:  Slump test: Positive left  FUNCTIONAL TESTS:  11/12/23 30 seconds chair stand test:   GAIT: Wide BOS, decreased heel strike bilaterally                                                                                                                                                                                                                   TODAY'S TREATMENT:                                                                                                         DATE:  11/19/23:  Nustep: level 5 x 11 minutes UE/LE Gastroc stretch x 2 holding 30 seconds Seated hamstring stretch x 2 bil holding 30 sec Seated SLR 2 x 10 bil LE Rows: green TB 2 x 10 holding 3 sec Supine trunk rotation: x 3 bil holding 30 sec Supine bridges: x 10 holding 5 sec  Functional Activities:  Sit to stand: x 10  Mini squats x 10 to mini motion of getting into pt's front load washer and dryer Modalities:  Moist heat to pt's low back in supine x 5 minutes     TODAY'S TREATMENT:                                                                                                         DATE:  11/15/2023 Supine lower trunk  rotation 4 x 30 seconds Yoga Bridge 2 sets of 10 Seated hamstrings stretch (good posture, slight bend in the knee) 4 x 30 seconds Supine hamstrings stretch 4 x 20 seconds (OK to do seated  only with good posture) Seated straight leg raises 2 sets of 10  Functional Activities: Log roll for bed mobility, review spine anatomy with model, review imaging Lumbar extension in standing (hands on gluteals) 10 x 3 seconds (postural correction) Shoulder blade pinches 10 x 5 seconds (postural correction)   11/12/23  Therex: HEP instruction/performance c cues for techniques, handout provided.  Trial set performed of each for comprehension and symptom assessment.  See below for exercise list Self Care:  Pt edu in log rolling to assist with bed mobility.    PATIENT EDUCATION:  Education details: HEP, POC Person educated: Patient Education method: Programmer, multimedia, Demonstration, Verbal cues, and Handouts Education comprehension: verbalized understanding, returned demonstration, and verbal cues required  HOME EXERCISE PROGRAM: Access Code: [email or web address] URL: [email or web address] Date: 11/15/2023 Prepared by: Pauletta Browns  Exercises - Supine Lower Trunk Rotation  - 2 x daily - 7 x weekly - 4 reps - 30 seconds hold - Supine Bridge  - 2 x daily - 7 x weekly - 10 reps - 1-10 seconds  hold - Seated Hamstring Stretch  - 2 x daily - 7 x weekly - 2 sets - 3 reps - 30 seconds hold - Seated Small Alternating Straight Leg Lifts with Heel Touch  - 2 x daily - 7 x weekly - 2 sets - 10 reps - Standing Lumbar Extension at Wall - Forearms  - 5 x daily - 7 x weekly - 1 sets - 5 reps - 3 seconds hold - Standing Scapular Retraction  - 5 x daily - 7 x weekly - 1 sets - 5 reps - 5 second hold  ASSESSMENT:  CLINICAL IMPRESSION: Carron tolerated all exercises well. Pt with increased pain with Rt trunk rotation. Pt reported cramping in her upper glute max. We discussed adding aquatic therapy for next Friday.  Continue skilled PT interventions to maximize pt's function.    OBJECTIVE IMPAIRMENTS: decreased mobility, difficulty walking, decreased ROM, decreased strength, impaired flexibility, obesity, and pain.   ACTIVITY LIMITATIONS: lifting, bending, sitting, standing, squatting, stairs, transfers, and bed mobility  PARTICIPATION LIMITATIONS: cleaning, laundry, community activity, and occupation  PERSONAL FACTORS: 3+ comorbidities: see PMH above  are also affecting patient's functional outcome.   REHAB POTENTIAL: Good  CLINICAL DECISION MAKING: Stable/uncomplicated  EVALUATION COMPLEXITY: Low   GOALS: Goals reviewed with patient? Yes  SHORT TERM GOALS: (target date for Short term goals are 3 weeks 12/03/2023)  1. Patient will demonstrate independent use of home exercise program to maintain progress from in clinic treatments.  Goal status: Met 11/15/2023  LONG TERM GOALS: (target dates for all long term goals are 10 weeks  01/24/2024)   1. Patient will demonstrate/report pain at worst less than or equal to 2/10 to facilitate minimal limitation in daily activity secondary to pain symptoms.  Goal status: New   2. Patient will demonstrate independent use of home exercise program to facilitate ability to maintain/progress functional gains from skilled physical therapy services.  Goal status: New   3. Patient will demonstrate Patient specific functional scale avg > or = 6 to indicate reduced disability due to condition.   Goal status: New   4. Patient will demonstrate lumbar extension 100 % WFL s symptoms to facilitate upright standing, walking posture at PLOF s limitation.  Goal status: New   5.  Pt will be able to perform 5 time sit to stand in </= 15 seconds with/without UE support.  Goal status: New   6.  Pt will improve her trunk  rotation by 25% bilaterally.  Goal status: New   PLAN:  PT FREQUENCY: 1-2x/week  PT DURATION: 10 weeks  PLANNED INTERVENTIONS: Can include  16109- PT Re-evaluation, 97110-Therapeutic exercises, 97530- Therapeutic activity, 97112- Neuromuscular re-education, [phone]- Self Care, 97140- Manual therapy, [phone]- Gait training, [phone]- Orthotic Fit/training, [phone]- Canalith repositioning, [id number]- Aquatic Therapy, [phone]- Electrical stimulation (unattended), 97750 Physical performance testing, [id number]- Electrical stimulation (manual), 97016- Vasopneumatic device, [id number]- Ultrasound, [id number]- Traction (mechanical), [id number]- Ionotophoresis 4mg /ml Dexamethasone, Patient/Family education, Balance training, Stair training, Taping, Dry Needling, Joint mobilization, Joint manipulation, Spinal manipulation, Spinal mobilization, Scar mobilization, Vestibular training, Visual/preceptual remediation/compensation, DME instructions, Cryotherapy, and Moist heat.  All performed as medically necessary.  All included unless contraindicated  PLAN FOR NEXT SESSION: Review HEP knowledge/results.  Core/lumbar strengthening, posture and body mechanics work, consider DN to lumbar paraspinals and glutes (handout issued at eval)      Sharmon Leyden, PT, MPT 11/19/2023, 8:46 AM  Date of referral: 10/31/23 Referring provider: London Sheer, MD  Referring diagnosis?  Diagnosis  M48.062 (ICD-10-CM) - Lumbar stenosis with neurogenic claudication   Treatment diagnosis? (if different than referring diagnosis) M54.59, M25.552, M25.551, R26.2, M62.81  What was this (referring dx) caused by? Thana Ates of Condition: Initial Onset (within last 3 months)   Laterality: Both  Current Functional Measure Score: Other Patient specific activity score (4)  Objective measurements identify impairments when they are compared to normal values, the uninvolved extremity, and prior level of function.  [x]  Yes  []  No  Objective assessment of functional ability: Moderate functional limitations   Briefly describe symptoms: pain in low back, bilateral hips, c h/o fall and knee  buckling  How did symptoms start: fall 4-5 months ago  Average pain intensity:  Last 24 hours: 5/10  Past week: can reach 10/10   How often does the pt experience symptoms? Constantly  How much have the symptoms interfered with usual daily activities? Moderately  How has condition changed since care began at this facility? A little worse  In general, how is the patients overall health? Fair   BACK PAIN (STarT Back Screening Tool) Has pain spread down the leg(s) at some time in the last 2 weeks? Yes left leg Has there been pain in the shoulder or neck at some time in the last 2 weeks? Yes Has the pt only walked short distances because of back pain? yes Has patient dressed more slowly because of back pain in the past 2 weeks? yes Does patient think it's not safe for a person with this condition to be physically active? no Does patient have worrying thoughts a lot of the time? no Does patient feel back pain is terrible and will never get any better? no Has patient stopped enjoying things they usually enjoy? yes

## 2023-11-26 ENCOUNTER — Ambulatory Visit (INDEPENDENT_AMBULATORY_CARE_PROVIDER_SITE_OTHER): Admitting: Physical Therapy

## 2023-11-26 ENCOUNTER — Encounter: Payer: Self-pay | Admitting: Physical Therapy

## 2023-11-26 DIAGNOSIS — R262 Difficulty in walking, not elsewhere classified: Secondary | ICD-10-CM | POA: Diagnosis not present

## 2023-11-26 DIAGNOSIS — M6281 Muscle weakness (generalized): Secondary | ICD-10-CM

## 2023-11-26 DIAGNOSIS — M25552 Pain in left hip: Secondary | ICD-10-CM

## 2023-11-26 DIAGNOSIS — M5459 Other low back pain: Secondary | ICD-10-CM

## 2023-11-26 DIAGNOSIS — M25551 Pain in right hip: Secondary | ICD-10-CM

## 2023-11-26 NOTE — Therapy (Signed)
 OUTPATIENT PHYSICAL THERAPY THORACOLUMBAR TREATMENT   Patient Name: Andrea Montes MRN: [id number] DOB:1955-09-04, 68 y.o., female Today's Date: 11/26/2023  END OF SESSION:  PT End of Session - 11/26/23 0931     Visit Number 4    Number of Visits 20    Date for PT Re-Evaluation 01/24/24    Authorization Type [employer] Medicare    PT Start Time 0855    PT Stop Time 0930    PT Time Calculation (min) 35 min    Activity Tolerance Patient tolerated treatment well;No increased pain;Patient limited by pain    Behavior During Therapy [facility] for tasks assessed/performed                Past Medical History:  Diagnosis Date   Anemia    no current med.   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   GERD (gastroesophageal reflux disease)    Heart murmur    last  echo- 2010   History of blood transfusion    History of shingles    Hyperlipidemia    Hypertension    borderline not on medicaiton at this time   Impingement syndrome of left shoulder region 10/2014   Joint pain    Migraines    Obesity    Rheumatoid arteritis (HCC)    Seasonal allergies    Sleep apnea    uses CPAP nightly   Trigger finger, left middle finger    Vitamin D deficiency    Wears partial dentures    upper   Past Surgical History:  Procedure Laterality Date   CARPAL TUNNEL RELEASE Left 04/25/2006   CARPAL TUNNEL RELEASE Right 07/15/2018   Procedure: RIGHT CARPAL TUNNEL RELEASE;   Surgeon: Cammy Copa, MD;  Location: [facility] OR;  Service: Orthopedics;  Laterality: Right;   CLOSED MANIPULATION SHOULDER WITH STERIOD INJECTION Left 02/03/2015   Procedure: LEFT  MANIPULATION SHOULDER UNDER ANESTHESIA WITH INJECTION OF STEROID;  Surgeon: Mckinley Jewel, MD;  Location: [facility];  Service: Orthopedics;  Laterality: Left;   COLONOSCOPY     COLONOSCOPY WITH PROPOFOL  10/09/2011   POLYPECTOMY     RESECTION DISTAL CLAVICAL Left 11/04/2014   Procedure: RESECTION DISTAL CLAVICAL;  Surgeon: Mckinley Jewel, MD;  Location: [facility];  Service: Orthopedics;  Laterality: Left;   ROTATOR CUFF REPAIR  2010   SHOULDER ARTHROSCOPY WITH ROTATOR CUFF REPAIR Right 10/04/2008   SHOULDER ARTHROSCOPY WITH SUBACROMIAL DECOMPRESSION, ROTATOR CUFF REPAIR AND BICEP TENDON REPAIR Left 11/04/2014   Procedure: LEFT SHOULDER ARTHROSCOPY WITH DEBRIDEMENT, DISTAL CLAVICLE EXCISION, ACROMIOPLASTY, ROTATOR CUFF REPAIR AND BICEP TENODESIS;  Surgeon: Mckinley Jewel, MD;  Location: [facility];  Service: Orthopedics;  Laterality: Left;   TRIGGER FINGER RELEASE Right 02/03/2015   Procedure: RIGHT LONG FINGER TRIGGER RELEASE;  Surgeon: Mckinley Jewel, MD;  Location: [facility];  Service: Orthopedics;  Laterality: Right;   TRIGGER FINGER RELEASE Left 10/10/2017   Procedure: LEFT HAND RELEASE TRIGGER FINGER 3RD LONG FINGER AND CYST EXCISION;  Surgeon: Cammy Copa, MD;  Location: [facility];  Service: Orthopedics;  Laterality: Left;   TRIGGER FINGER RELEASE Right 02/16/2021   Procedure: TRIGGER FINGER RELEASE RIGHT RING FINGER;  Surgeon: Cammy Copa, MD;  Location: [facility];  Service: Orthopedics;  Laterality: Right;   TUBAL LIGATION     VAGINAL HYSTERECTOMY  2001   partial   Patient Active Problem List   Diagnosis Date Noted   Urinary symptom or sign 03/12/2023   Hirsutism 07/20/2022   Hyperglycemia due to type 2 diabetes mellitus (HCC) 07/20/2022    Type 2 diabetes mellitus without complication, without long-term current use of insulin (HCC) 10/01/2018   Trigger finger, right ring finger 09/09/2015   Low back pain radiating to both legs 05/06/2012   Leg length discrepancy 05/06/2012   HYPERTENSION, BENIGN 04/11/2009   LEG EDEMA, BILATERAL 10/08/2008   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 10/17/2006   Carpal tunnel syndrome, right upper limb 10/17/2006    PCP: Renford Dills, MD   REFERRING PROVIDER: London Sheer, MD   REFERRING DIAG:  Diagnosis  [phone] (ICD-10-CM) - Lumbar stenosis with neurogenic claudication    Rationale for Evaluation and Treatment: Rehabilitation  THERAPY DIAG:  Other low back pain  Pain in left hip  Pain in right hip  Difficulty in walking, not elsewhere classified  Muscle weakness (generalized)  ONSET DATE: 4-5 months ago after a fall due to knee buckling  SUBJECTIVE:  SUBJECTIVE STATEMENT: Pt arriving 10 minutes late due to traffic. Pt reporting some pain down left LE at times.     PERTINENT HISTORY:  See PMH above  PAIN:  NPRS scale: 4-5/10 today , worse pain 9-10/10 Pain location: low back and bil hips Pain description: achy, throbbing, numbness Aggravating factors: sitting too long, transfers, walking too much (overuse) Relieving factors: epidural, ice/heat, pain meds, muscle relaxers  PRECAUTIONS: None  WEIGHT BEARING RESTRICTIONS: No  FALLS:  Has patient fallen  in last 6 months? Yes. Number of falls 1, pt stating her left knee buckled.   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  Spondylolisthesis seen at L4/5.  No other significant degenerative changes seen.   MRI of the lumbar spine from 09/12/2023 was previously independently reviewed and interpreted, showing central and lateral recess stenosis at L4/5.  Spondylolisthesis seen at L4/5.  Bilateral facet arthropathy at L4/5 and L5/S1.  PATIENT SURVEYS:  Patient-Specific Activity Scoring Scheme  "0" represents "unable to perform." "10" represents "able to perform at prior level. 0 1 2 3 4 5 6 7 8 9  10 (Date and Score)   Activity Eval  11/12/23    1. walking  5    2. Getting out of chair  4    3. Getting out of bed 3   4.    5.    Score 12/3= 4    Total score = sum of the activity scores/number of activities Minimum detectable change (90%CI) for average score = 2 points Minimum detectable change (90%CI) for single activity score = 3 points  SCREENING FOR RED FLAGS: Bowel or bladder incontinence: No Cauda equina syndrome: No  COGNITION: Overall cognitive status: WFL normal      SENSATION: WFL    POSTURE:  Rounded shoulders, forward head, increased lumbar lordosis  PALPATION: TTP:   LUMBAR ROM:   Directional Preference Assessment: Centralization: Peripheralization:   AROM Eval 11/12/23 11/26/23  Flexion 30 40 c tightness  Extension 22 c pain 22 c tightness  Right lateral flexion 25   c pain 45  Left lateral flexion 12 tingling down left LE 32 c pain down left LE  Right rotation Limited 80% c pain Limited 75%  Left rotation Limited 80% c pain  Limited 75%   (Blank rows = not tested)  LOWER EXTREMITY ROM:      Right 11/12/23 Supine active  Left 11/12/23 Supine active  Hip flexion 88 84  Hip extension    Hip abduction    Hip adduction    Hip internal rotation    Hip external rotation    Knee flexion 110 105  Knee extension     (Blank rows = not tested)  LOWER EXTREMITY MMT:    MMT Right Eval Sitting  Left eval  Hip flexion 19.5 ppsi 17.6 ppsi  Hip extension    Hip abduction    Hip adduction    Hip internal rotation    Hip external rotation    Knee flexion 15.3 ppsi 19.9 ppsi  Knee  extension 25.2 ppsi 14.6 ppsi    (Blank rows = not tested)  LUMBAR SPECIAL TESTS:  Slump test: Positive left  FUNCTIONAL TESTS:  11/12/23 30 seconds chair stand test:   GAIT: Wide BOS, decreased heel strike bilaterally                                                                                                                                                                                                                   TODAY'S TREATMENT:                                                                                                         DATE:  11/26/23:  Nustep: level 6 x 8 minutes UE/LE Standing hip abduction x 15 bil LE c UE support Standing hip extension x 15 bil LE c UE support Trunk wall extension on elbows x 10 holding 10 sec Rolling ball up the wall for thoracic extension at end range x 10 holding 3 sec See updated lumbar ROM measurements above Functional Activities:  Sit to stand: x 10  Rows: green TB 2 x 10 holding 3 sec for postural strengthening Leg Press: bil 50# 2 x 10 Manual Left hip distraction x 3 holding 30 sec     TODAY'S TREATMENT:                                                                                                          DATE:  11/19/23:  Nustep: level 5 x 11 minutes UE/LE Gastroc stretch x 2 holding 30 seconds Seated hamstring stretch x 2 bil holding 30 sec Seated SLR 2 x 10  bil LE Rows: green TB 2 x 10 holding 3 sec Supine trunk rotation: x 3 bil holding 30 sec Supine bridges: x 10 holding 5 sec  Functional Activities:  Sit to stand: x 10  Mini squats x 10 to mini motion of getting into pt's front load washer and dryer Modalities:  Moist heat to pt's low back in supine x 5 minutes     TODAY'S TREATMENT:                                                                                                         DATE:  11/15/2023 Supine lower trunk rotation 4 x 30 seconds Yoga Bridge 2 sets of 10 Seated hamstrings stretch (good posture, slight bend in the knee) 4 x 30 seconds Supine hamstrings stretch 4 x 20 seconds (OK to do seated only with good posture) Seated straight leg raises 2 sets of 10  Functional Activities: Log roll for bed mobility, review spine anatomy with model, review imaging Lumbar extension in standing (hands on gluteals) 10 x 3 seconds (postural correction) Shoulder blade pinches 10 x 5 seconds (postural correction)   11/12/23  Therex: HEP instruction/performance c cues for techniques, handout provided.  Trial set performed of each for comprehension and symptom assessment.  See below for exercise list Self Care:  Pt edu in log rolling to assist with bed mobility.    PATIENT EDUCATION:  Education details: HEP, POC Person educated: Patient Education method: Programmer, multimedia, Demonstration, Verbal cues, and Handouts Education comprehension: verbalized understanding, returned demonstration, and verbal cues required  HOME EXERCISE PROGRAM: Access Code: [email or web address] URL: [email or web address] Date: 11/15/2023 Prepared by: Pauletta Browns  Exercises - Supine Lower Trunk Rotation  - 2 x daily - 7 x weekly - 4 reps - 30 seconds hold - Supine Bridge  - 2 x daily - 7 x weekly  - 10 reps - 1-10 seconds  hold - Seated Hamstring Stretch  - 2 x daily - 7 x weekly - 2 sets - 3 reps - 30 seconds hold - Seated Small Alternating Straight Leg Lifts with Heel Touch  - 2 x daily - 7 x weekly - 2 sets - 10 reps - Standing Lumbar Extension at Wall - Forearms  - 5 x daily - 7 x weekly - 1 sets - 5 reps - 3 seconds hold - Standing Scapular Retraction  - 5 x daily - 7 x weekly - 1 sets - 5 reps - 5 second hold  ASSESSMENT:  CLINICAL IMPRESSION: Pt is scheduled for aquatic therapy this Friday. Pt tolerating exercises well. We tried left distraction with positive results. Continue skilled Pt services.    OBJECTIVE IMPAIRMENTS: decreased mobility, difficulty walking, decreased ROM, decreased strength, impaired flexibility, obesity, and pain.   ACTIVITY LIMITATIONS: lifting, bending, sitting, standing, squatting, stairs, transfers, and bed mobility  PARTICIPATION LIMITATIONS: cleaning, laundry, community activity, and occupation  PERSONAL FACTORS: 3+ comorbidities: see PMH above  are also affecting patient's functional outcome.   REHAB POTENTIAL: Good  CLINICAL DECISION MAKING: Stable/uncomplicated  EVALUATION COMPLEXITY: Low   GOALS: Goals  reviewed with patient? Yes  SHORT TERM GOALS: (target date for Short term goals are 3 weeks 12/03/2023)  1. Patient will demonstrate independent use of home exercise program to maintain progress from in clinic treatments.  Goal status: Met 11/15/2023  LONG TERM GOALS: (target dates for all long term goals are 10 weeks  01/24/2024)   1. Patient will demonstrate/report pain at worst less than or equal to 2/10 to facilitate minimal limitation in daily activity secondary to pain symptoms.  Goal status: New   2. Patient will demonstrate independent use of home exercise program to facilitate ability to maintain/progress functional gains from skilled physical therapy services.  Goal status: New   3. Patient will demonstrate Patient  specific functional scale avg > or = 6 to indicate reduced disability due to condition.   Goal status: New   4. Patient will demonstrate lumbar extension 100 % WFL s symptoms to facilitate upright standing, walking posture at PLOF s limitation.  Goal status: New   5.  Pt will be able to perform 5 time sit to stand in </= 15 seconds with/without UE support.  Goal status: New   6.  Pt will improve her trunk rotation by 25% bilaterally.  Goal status: New   PLAN:  PT FREQUENCY: 1-2x/week  PT DURATION: 10 weeks  PLANNED INTERVENTIONS: Can include 40981- PT Re-evaluation, 97110-Therapeutic exercises, 97530- Therapeutic activity, 97112- Neuromuscular re-education, [phone]- Self Care, 97140- Manual therapy, [phone]- Gait training, [phone]- Orthotic Fit/training, [phone]- Canalith repositioning, [id number]- Aquatic Therapy, [phone]- Electrical stimulation (unattended), 97750 Physical performance testing, [id number]- Electrical stimulation (manual), 97016- Vasopneumatic device, [id number]- Ultrasound, [id number]- Traction (mechanical), [id number]- Ionotophoresis 4mg /ml Dexamethasone, Patient/Family education, Balance training, Stair training, Taping, Dry Needling, Joint mobilization, Joint manipulation, Spinal manipulation, Spinal mobilization, Scar mobilization, Vestibular training, Visual/preceptual remediation/compensation, DME instructions, Cryotherapy, and Moist heat.  All performed as medically necessary.  All included unless contraindicated  PLAN FOR NEXT SESSION: Review HEP knowledge/results.  Core/lumbar strengthening, posture and body mechanics work, consider DN to lumbar paraspinals and glutes (handout issued at eval)      Sharmon Leyden, PT, MPT 11/26/2023, 9:59 AM  Date of referral: 10/31/23 Referring provider: London Sheer, MD  Referring diagnosis?  Diagnosis  M48.062 (ICD-10-CM) - Lumbar stenosis with neurogenic claudication   Treatment diagnosis? (if different than referring diagnosis) M54.59, M25.552,  M25.551, R26.2, M62.81  What was this (referring dx) caused by? Thana Ates of Condition: Initial Onset (within last 3 months)   Laterality: Both  Current Functional Measure Score: Other Patient specific activity score (4)  Objective measurements identify impairments when they are compared to normal values, the uninvolved extremity, and prior level of function.  [x]  Yes  []  No  Objective assessment of functional ability: Moderate functional limitations   Briefly describe symptoms: pain in low back, bilateral hips, c h/o fall and knee buckling  How did symptoms start: fall 4-5 months ago  Average pain intensity:  Last 24 hours: 5/10  Past week: can reach 10/10   How often does the pt experience symptoms? Constantly  How much have the symptoms interfered with usual daily activities? Moderately  How has condition changed since care began at this facility? A little worse  In general, how is the patients overall health? Fair   BACK PAIN (STarT Back Screening Tool) Has pain spread down the leg(s) at some time in the last 2 weeks? Yes left leg Has there been pain in the shoulder or neck at some time in the last 2  weeks? Yes Has the pt only walked short distances because of back pain? yes Has patient dressed more slowly because of back pain in the past 2 weeks? yes Does patient think it's not safe for a person with this condition to be physically active? no Does patient have worrying thoughts a lot of the time? no Does patient feel back pain is terrible and will never get any better? no Has patient stopped enjoying things they usually enjoy? yes

## 2023-11-28 ENCOUNTER — Encounter: Admitting: Physical Therapy

## 2023-11-29 ENCOUNTER — Ambulatory Visit: Admitting: Physical Therapy

## 2023-11-29 ENCOUNTER — Encounter: Payer: Self-pay | Admitting: Physical Therapy

## 2023-11-29 DIAGNOSIS — M25552 Pain in left hip: Secondary | ICD-10-CM

## 2023-11-29 DIAGNOSIS — R262 Difficulty in walking, not elsewhere classified: Secondary | ICD-10-CM

## 2023-11-29 DIAGNOSIS — M25551 Pain in right hip: Secondary | ICD-10-CM | POA: Diagnosis not present

## 2023-11-29 DIAGNOSIS — M6281 Muscle weakness (generalized): Secondary | ICD-10-CM

## 2023-11-29 DIAGNOSIS — M5459 Other low back pain: Secondary | ICD-10-CM

## 2023-11-29 NOTE — Therapy (Signed)
 OUTPATIENT PHYSICAL THERAPY THORACOLUMBAR TREATMENT   Patient Name: Andrea Montes MRN: [id number] DOB:07-16-1956, 68 y.o., female Today's Date: 11/29/2023  END OF SESSION:  PT End of Session - 11/29/23 0805     Visit Number 5    Number of Visits 20    Date for PT Re-Evaluation 01/24/24    Authorization Type [employer] Medicare    PT Start Time 0800    PT Stop Time 0845    PT Time Calculation (min) 45 min    Activity Tolerance Patient tolerated treatment well;No increased pain    Behavior During Therapy WFL for tasks assessed/performed                Past Medical History:  Diagnosis Date   Anemia    no current med.   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   GERD (gastroesophageal reflux disease)    Heart murmur    last  echo- 2010   History of blood transfusion    History of shingles    Hyperlipidemia    Hypertension    borderline not on medicaiton at this time   Impingement syndrome of left shoulder region 10/2014   Joint pain    Migraines    Obesity    Rheumatoid arteritis (HCC)    Seasonal allergies    Sleep apnea    uses CPAP nightly   Trigger finger, left middle finger    Vitamin D deficiency    Wears partial dentures    upper   Past Surgical History:  Procedure Laterality Date   CARPAL TUNNEL RELEASE Left 04/25/2006   CARPAL TUNNEL RELEASE Right 07/15/2018   Procedure: RIGHT CARPAL TUNNEL RELEASE;  Surgeon: Cammy Copa, MD;  Location: [facility];  Service: Orthopedics;  Laterality: Right;   CLOSED MANIPULATION SHOULDER WITH STERIOD INJECTION Left 02/03/2015   Procedure: LEFT  MANIPULATION SHOULDER UNDER ANESTHESIA WITH INJECTION OF STEROID;  Surgeon: Mckinley Jewel, MD;  Location: [facility];  Service: Orthopedics;  Laterality: Left;   COLONOSCOPY     COLONOSCOPY WITH PROPOFOL  10/09/2011   POLYPECTOMY     RESECTION DISTAL CLAVICAL Left 11/04/2014   Procedure: RESECTION DISTAL CLAVICAL;  Surgeon: Mckinley Jewel, MD;  Location: [facility];  Service: Orthopedics;  Laterality: Left;   ROTATOR CUFF REPAIR  2010   SHOULDER ARTHROSCOPY WITH ROTATOR CUFF REPAIR Right 10/04/2008   SHOULDER ARTHROSCOPY WITH SUBACROMIAL DECOMPRESSION, ROTATOR CUFF REPAIR AND BICEP TENDON REPAIR Left 11/04/2014   Procedure: LEFT SHOULDER ARTHROSCOPY WITH DEBRIDEMENT, DISTAL CLAVICLE EXCISION, ACROMIOPLASTY, ROTATOR CUFF REPAIR AND BICEP TENODESIS;  Surgeon: Mckinley Jewel, MD;  Location: [facility];  Service: Orthopedics;  Laterality: Left;   TRIGGER FINGER RELEASE Right 02/03/2015   Procedure: RIGHT LONG FINGER TRIGGER RELEASE;  Surgeon: Mckinley Jewel, MD;  Location:  SURGERY CENTER;  Service: Orthopedics;  Laterality: Right;  TRIGGER FINGER RELEASE Left 10/10/2017   Procedure: LEFT HAND RELEASE TRIGGER FINGER 3RD LONG FINGER AND CYST EXCISION;  Surgeon: Cammy Copa, MD;  Location: [facility];  Service: Orthopedics;  Laterality: Left;   TRIGGER FINGER RELEASE Right 02/16/2021   Procedure: TRIGGER FINGER RELEASE RIGHT RING FINGER;  Surgeon: Cammy Copa, MD;  Location: [facility] OR;  Service: Orthopedics;  Laterality: Right;   TUBAL LIGATION     VAGINAL HYSTERECTOMY  2001   partial   Patient Active Problem List   Diagnosis Date Noted   Urinary symptom or sign 03/12/2023   Hirsutism 07/20/2022   Hyperglycemia due to type 2 diabetes mellitus (HCC) 07/20/2022   Type 2 diabetes mellitus  without complication, without long-term current use of insulin (HCC) 10/01/2018   Trigger finger, right ring finger 09/09/2015   Low back pain radiating to both legs 05/06/2012   Leg length discrepancy 05/06/2012   HYPERTENSION, BENIGN 04/11/2009   LEG EDEMA, BILATERAL 10/08/2008   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 10/17/2006   Carpal tunnel syndrome, right upper limb 10/17/2006    PCP: Renford Dills, MD   REFERRING PROVIDER: London Sheer, MD   REFERRING DIAG:  Diagnosis  [phone] (ICD-10-CM) - Lumbar stenosis with neurogenic claudication    Rationale for Evaluation and Treatment: Rehabilitation  THERAPY DIAG:  Other low back pain  Pain in left hip  Pain in right hip  Difficulty in walking, not elsewhere classified  Muscle weakness (generalized)  ONSET DATE: 4-5 months ago after a fall due to knee buckling  SUBJECTIVE:  SUBJECTIVE STATEMENT: She states she is feeling and moving better since starting PT but still with pain.    PERTINENT HISTORY:  See PMH above  PAIN:  NPRS scale: 3-5/10 today  Pain location: low back and bil hips Pain description: achy, throbbing, numbness Aggravating factors: sitting too long, transfers, walking too much (overuse) Relieving factors: epidural, ice/heat, pain meds, muscle relaxers  PRECAUTIONS: None  WEIGHT BEARING RESTRICTIONS: No  FALLS:  Has patient fallen in last 6 months? Yes. Number of falls 1, pt stating  her left knee buckled.   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  Spondylolisthesis seen at L4/5.  No other significant degenerative changes seen.   MRI of the lumbar spine from 09/12/2023 was previously independently reviewed and interpreted, showing central and lateral recess stenosis at L4/5.  Spondylolisthesis seen at L4/5.  Bilateral facet arthropathy at L4/5 and L5/S1.  PATIENT SURVEYS:  Patient-Specific Activity Scoring Scheme  "0" represents "unable to perform." "10" represents "able to perform at prior level. 0 1 2 3 4 5 6 7 8 9  10 (Date and Score)   Activity Eval  11/12/23    1. walking  5    2. Getting out of chair  4    3. Getting out of bed 3   4.    5.    Score 12/3= 4    Total score = sum of the activity scores/number of activities Minimum detectable change (90%CI) for average score = 2 points Minimum detectable change (90%CI) for single activity score = 3 points  SCREENING FOR RED FLAGS: Bowel or bladder incontinence: No Cauda equina syndrome: No  COGNITION: Overall cognitive status: WFL normal      SENSATION: WFL    POSTURE:  Rounded shoulders, forward head, increased lumbar lordosis  PALPATION: TTP:   LUMBAR ROM:   Directional Preference Assessment: Centralization: Peripheralization:   AROM Eval 11/12/23 11/26/23  Flexion 30 40 c tightness  Extension 22 c pain 22 c tightness  Right lateral flexion 25  c pain 45  Left lateral flexion 12 tingling down  left LE 32 c pain down left LE  Right rotation Limited 80% c pain Limited 75%  Left rotation Limited 80% c pain  Limited 75%   (Blank rows = not tested)  LOWER EXTREMITY ROM:      Right 11/12/23 Supine active  Left 11/12/23 Supine active  Hip flexion 88 84  Hip extension    Hip abduction    Hip adduction    Hip internal rotation    Hip external rotation    Knee flexion 110 105  Knee extension     (Blank rows = not tested)  LOWER EXTREMITY MMT:    MMT Right Eval Sitting  Left eval  Hip flexion 19.5 ppsi 17.6 ppsi  Hip extension    Hip abduction    Hip adduction    Hip internal rotation    Hip external rotation    Knee flexion 15.3 ppsi 19.9 ppsi  Knee extension 25.2 ppsi 14.6 ppsi    (  Blank rows = not tested)  LUMBAR SPECIAL TESTS:  Slump test: Positive left  FUNCTIONAL TESTS:  11/12/23 30 seconds chair stand test:   GAIT: Wide BOS, decreased heel strike bilaterally                                                                                                                                                                                                                  Today's treatment 11/29/2023 Pt seen for aquatic therapy today.  Treatment took place in water 3.5-4.75 ft in depth at the [facility] pool. Temp of water was 91.  Pt entered/exited the pool via stairs   Pt requires the buoyancy and hydrostatic pressure of water for support, and to offload joints by unweighting joint load by at least 50 % in navel deep water and by at least 75-80% in chest to neck deep water.  Viscosity of the water is needed for resistance of strengthening. Water current perturbations provides challenge to standing balance requiring increased core activation.   Water exercises -Fwd walking, lateral walking and backwards walking horizontal width of pool  3 round trips each -Mini lunge stance push/pull with kickboard 15 reps with left foot fwd, 15 reps Right foot  fwd -Standing shoulder extension/flexion X15 dumbells -Leg swings abd/add X 15 reps bilat -Leg swings flexion/extension X 15 bilat -Lunge step to pool wall X 10 bilat -Standing shoulder circles into H abd/add "stir the pot" X 15 each way -Standing shoulder abduction/adduction X 15 bilat with dumbell and in squat -Standing shoulder horizontal abd/add X 15 bilat with dumbells -Lumbar "L" stretch 5 sec X 10 flexion, then 5 sec X 10 into extension upon return -Seated on pool bench, cycling, leg swings vertical and horizontal   TODAY'S TREATMENT:                                                                                                         DATE:  11/26/23:  Nustep: level 6 x 8 minutes UE/LE Standing hip abduction x 15 bil LE c UE support Standing hip extension x 15  bil LE c UE support Trunk wall extension on elbows x 10 holding 10 sec Rolling ball up the wall for thoracic extension at end range x 10 holding 3 sec See updated lumbar ROM measurements above Functional Activities:  Sit to stand: x 10  Rows: green TB 2 x 10 holding 3 sec for postural strengthening Leg Press: bil 50# 2 x 10 Manual Left hip distraction x 3 holding 30 sec     TODAY'S TREATMENT:                                                                                                         DATE:  11/19/23:  Nustep: level 5 x 11 minutes UE/LE Gastroc stretch x 2 holding 30 seconds Seated hamstring stretch x 2 bil holding 30 sec Seated SLR 2 x 10 bil LE Rows: green TB 2 x 10 holding 3 sec Supine trunk rotation: x 3 bil holding 30 sec Supine bridges: x 10 holding 5 sec  Functional Activities:  Sit to stand: x 10  Mini squats x 10 to mini motion of getting into pt's front load washer and dryer Modalities:  Moist heat to pt's low back in supine x 5 minutes     TODAY'S TREATMENT:                                                                                                         DATE:  11/15/2023 Supine  lower trunk rotation 4 x 30 seconds Yoga Bridge 2 sets of 10 Seated hamstrings stretch (good posture, slight bend in the knee) 4 x 30 seconds Supine hamstrings stretch 4 x 20 seconds (OK to do seated only with good posture) Seated straight leg raises 2 sets of 10  Functional Activities: Log roll for bed mobility, review spine anatomy with model, review imaging Lumbar extension in standing (hands on gluteals) 10 x 3 seconds (postural correction) Shoulder blade pinches 10 x 5 seconds (postural correction)   11/12/23  Therex: HEP instruction/performance c cues for techniques, handout provided.  Trial set performed of each for comprehension and symptom assessment.  See below for exercise list Self Care:  Pt edu in log rolling to assist with bed mobility.    PATIENT EDUCATION:  Education details: HEP, POC Person educated: Patient Education method: Programmer, multimedia, Demonstration, Verbal cues, and Handouts Education comprehension: verbalized understanding, returned demonstration, and verbal cues required  HOME EXERCISE PROGRAM: Access Code: [email or web address] URL: [email or web address] Date: 11/15/2023 Prepared by: Pauletta Browns  Exercises - Supine Lower Trunk Rotation  - 2 x daily - 7 x weekly - 4 reps - 30 seconds hold - Supine Bridge  -  2 x daily - 7 x weekly - 10 reps - 1-10 seconds  hold - Seated Hamstring Stretch  - 2 x daily - 7 x weekly - 2 sets - 3 reps - 30 seconds hold - Seated Small Alternating Straight Leg Lifts with Heel Touch  - 2 x daily - 7 x weekly - 2 sets - 10 reps - Standing Lumbar Extension at Wall - Forearms  - 5 x daily - 7 x weekly - 1 sets - 5 reps - 3 seconds hold - Standing Scapular Retraction  - 5 x daily - 7 x weekly - 1 sets - 5 reps - 5 second hold  Aquatic HEP Access Code: [email or web address] URL: [email or web address] Date: 11/29/2023 Prepared by: Ivery Quale  Exercises - Standing March at [facility]  - 1 x daily - 2 x weekly - 15 reps -  Standing Hip Flexion Extension at [employer]  - 1 x daily - 2 x weekly - 15 reps - Standing Hip Abduction Adduction at Pool Wall  - 1 x daily - 2 x weekly - 20 reps - Forward Walking  - 1 x daily - 2 x weekly - 4 sets - Backward Walking  - 1 x daily - 2 x weekly - 4 sets - Side Stepping  - 2 x daily - 2 x weekly - 4 sets - 10 reps - Bilateral Shoulder Horizontal Abduction Adduction AROM  - 1 x daily - 2 x weekly - 4 sets - Squat  - 1 x daily - 2 x weekly - 15 reps - Standing Single Leg Hip Circles  - 1 x daily - 2 x weekly - 1 sets - 20 reps  ASSESSMENT:  CLINICAL IMPRESSION: She had first aquatic PT session with good overall tolerance noted. I did print out and laminate aquatic HEP for her as well today.    OBJECTIVE IMPAIRMENTS: decreased mobility, difficulty walking, decreased ROM, decreased strength, impaired flexibility, obesity, and pain.   ACTIVITY LIMITATIONS: lifting, bending, sitting, standing, squatting, stairs, transfers, and bed mobility  PARTICIPATION LIMITATIONS: cleaning, laundry, community activity, and occupation  PERSONAL FACTORS: 3+ comorbidities: see PMH above  are also affecting patient's functional outcome.   REHAB POTENTIAL: Good  CLINICAL DECISION MAKING: Stable/uncomplicated  EVALUATION COMPLEXITY: Low   GOALS: Goals reviewed with patient? Yes  SHORT TERM GOALS: (target date for Short term goals are 3 weeks 12/03/2023)  1. Patient will demonstrate independent use of home exercise program to maintain progress from in clinic treatments.  Goal status: Met 11/15/2023  LONG TERM GOALS: (target dates for all long term goals are 10 weeks  01/24/2024)   1. Patient will demonstrate/report pain at worst less than or equal to 2/10 to facilitate minimal limitation in daily activity secondary to pain symptoms.  Goal status: New   2. Patient will demonstrate independent use of home exercise program to facilitate ability to maintain/progress functional gains from  skilled physical therapy services.  Goal status: New   3. Patient will demonstrate Patient specific functional scale avg > or = 6 to indicate reduced disability due to condition.   Goal status: New   4. Patient will demonstrate lumbar extension 100 % WFL s symptoms to facilitate upright standing, walking posture at PLOF s limitation.  Goal status: New   5.  Pt will be able to perform 5 time sit to stand in </= 15 seconds with/without UE support.  Goal status: New   6.  Pt will improve her trunk rotation by  25% bilaterally.  Goal status: New   PLAN:  PT FREQUENCY: 1-2x/week  PT DURATION: 10 weeks  PLANNED INTERVENTIONS: Can include 16109- PT Re-evaluation, 97110-Therapeutic exercises, 97530- Therapeutic activity, 97112- Neuromuscular re-education, [phone]- Self Care, 97140- Manual therapy, [phone]- Gait training, [phone]- Orthotic Fit/training, [phone]- Canalith repositioning, [id number]- Aquatic Therapy, [phone]- Electrical stimulation (unattended), 97750 Physical performance testing, [id number]- Electrical stimulation (manual), 97016- Vasopneumatic device, [id number]- Ultrasound, [id number]- Traction (mechanical), [id number]- Ionotophoresis 4mg /ml Dexamethasone, Patient/Family education, Balance training, Stair training, Taping, Dry Needling, Joint mobilization, Joint manipulation, Spinal manipulation, Spinal mobilization, Scar mobilization, Vestibular training, Visual/preceptual remediation/compensation, DME instructions, Cryotherapy, and Moist heat.  All performed as medically necessary.  All included unless contraindicated  PLAN FOR NEXT SESSION: how was aquatic PT? Core/lumbar strengthening, posture and body mechanics work, consider DN to lumbar paraspinals and glutes (handout issued at eval)      April Manson, PT, DPT 11/29/2023, 8:06 AM  Date of referral: 10/31/23 Referring provider: London Sheer, MD  Referring diagnosis?  Diagnosis  M48.062 (ICD-10-CM) - Lumbar stenosis with neurogenic claudication    Treatment diagnosis? (if different than referring diagnosis) M54.59, M25.552, M25.551, R26.2, M62.81  What was this (referring dx) caused by? Thana Ates of Condition: Initial Onset (within last 3 months)   Laterality: Both  Current Functional Measure Score: Other Patient specific activity score (4)  Objective measurements identify impairments when they are compared to normal values, the uninvolved extremity, and prior level of function.  [x]  Yes  []  No  Objective assessment of functional ability: Moderate functional limitations   Briefly describe symptoms: pain in low back, bilateral hips, c h/o fall and knee buckling  How did symptoms start: fall 4-5 months ago  Average pain intensity:  Last 24 hours: 5/10  Past week: can reach 10/10   How often does the pt experience symptoms? Constantly  How much have the symptoms interfered with usual daily activities? Moderately  How has condition changed since care began at this facility? A little worse  In general, how is the patients overall health? Fair   BACK PAIN (STarT Back Screening Tool) Has pain spread down the leg(s) at some time in the last 2 weeks? Yes left leg Has there been pain in the shoulder or neck at some time in the last 2 weeks? Yes Has the pt only walked short distances because of back pain? yes Has patient dressed more slowly because of back pain in the past 2 weeks? yes Does patient think it's not safe for a person with this condition to be physically active? no Does patient have worrying thoughts a lot of the time? no Does patient feel back pain is terrible and will never get any better? no Has patient stopped enjoying things they usually enjoy? yes

## 2023-12-03 ENCOUNTER — Telehealth: Payer: Self-pay | Admitting: Rehabilitative and Restorative Service Providers"

## 2023-12-03 ENCOUNTER — Encounter: Admitting: Rehabilitative and Restorative Service Providers"

## 2023-12-03 NOTE — Telephone Encounter (Signed)
 Left a VM reminding her of her appointment Thursday 12/05/2023 at 8:45 AM.  She completed online check-in so odd that she didn't attend or answer the phone.  Left [phone] number to change or cancel any future appointments.

## 2023-12-05 ENCOUNTER — Ambulatory Visit (INDEPENDENT_AMBULATORY_CARE_PROVIDER_SITE_OTHER): Admitting: Rehabilitative and Restorative Service Providers"

## 2023-12-05 ENCOUNTER — Encounter: Payer: Self-pay | Admitting: Rehabilitative and Restorative Service Providers"

## 2023-12-05 DIAGNOSIS — M25552 Pain in left hip: Secondary | ICD-10-CM | POA: Diagnosis not present

## 2023-12-05 DIAGNOSIS — M6281 Muscle weakness (generalized): Secondary | ICD-10-CM

## 2023-12-05 DIAGNOSIS — M25551 Pain in right hip: Secondary | ICD-10-CM | POA: Diagnosis not present

## 2023-12-05 DIAGNOSIS — R262 Difficulty in walking, not elsewhere classified: Secondary | ICD-10-CM

## 2023-12-05 DIAGNOSIS — M5459 Other low back pain: Secondary | ICD-10-CM

## 2023-12-05 NOTE — Therapy (Signed)
 OUTPATIENT PHYSICAL THERAPY THORACOLUMBAR TREATMENT   Patient Name: Andrea Montes MRN: [id number] DOB:11-Aug-1956, 68 y.o., female Today's Date: 12/05/2023  END OF SESSION:  PT End of Session - 12/05/23 0845     Visit Number 6    Number of Visits 20    Date for PT Re-Evaluation 01/24/24    Authorization Type [employer] Medicare    PT Start Time 0844    PT Stop Time 0927    PT Time Calculation (min) 43 min    Activity Tolerance Patient tolerated treatment well;No increased pain;Patient limited by fatigue;Patient limited by pain    Behavior During Therapy [facility] for tasks assessed/performed                 Past Medical History:  Diagnosis Date   Anemia    no current med.   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   GERD (gastroesophageal reflux disease)    Heart murmur    last  echo- 2010   History of blood transfusion    History of shingles    Hyperlipidemia    Hypertension    borderline not on medicaiton at this time   Impingement syndrome of left shoulder region 10/2014   Joint pain    Migraines    Obesity    Rheumatoid arteritis (HCC)    Seasonal allergies    Sleep apnea    uses CPAP nightly   Trigger finger, left middle finger    Vitamin D deficiency    Wears partial dentures    upper   Past Surgical History:  Procedure Laterality Date   CARPAL TUNNEL RELEASE Left 04/25/2006   CARPAL TUNNEL RELEASE Right 07/15/2018   Procedure:  RIGHT CARPAL TUNNEL RELEASE;  Surgeon: Andrea Mesi, MD;  Location: [facility] OR;  Service: Orthopedics;  Laterality: Right;   CLOSED MANIPULATION SHOULDER WITH STERIOD INJECTION Left 02/03/2015   Procedure: LEFT  MANIPULATION SHOULDER UNDER ANESTHESIA WITH INJECTION OF STEROID;  Surgeon: Andrea Cross, MD;  Location: [facility];  Service: Orthopedics;  Laterality: Left;   COLONOSCOPY     COLONOSCOPY WITH PROPOFOL  10/09/2011   POLYPECTOMY     RESECTION DISTAL CLAVICAL Left 11/04/2014   Procedure: RESECTION DISTAL CLAVICAL;  Surgeon: Andrea Cross, MD;  Location: [facility];  Service: Orthopedics;  Laterality: Left;   ROTATOR CUFF REPAIR  2010   SHOULDER ARTHROSCOPY WITH ROTATOR CUFF REPAIR Right 10/04/2008   SHOULDER ARTHROSCOPY WITH SUBACROMIAL DECOMPRESSION, ROTATOR CUFF REPAIR AND BICEP TENDON REPAIR Left 11/04/2014   Procedure: LEFT SHOULDER ARTHROSCOPY WITH DEBRIDEMENT, DISTAL CLAVICLE EXCISION, ACROMIOPLASTY, ROTATOR CUFF REPAIR AND BICEP TENODESIS;  Surgeon: Andrea Cross, MD;  Location: [facility];  Service: Orthopedics;  Laterality: Left;   TRIGGER FINGER RELEASE Right 02/03/2015   Procedure: RIGHT LONG FINGER TRIGGER RELEASE;  Surgeon: Andrea Cross, MD;  Location: [facility];  Service: Orthopedics;  Laterality: Right;   TRIGGER FINGER RELEASE Left 10/10/2017   Procedure: LEFT HAND RELEASE TRIGGER FINGER 3RD LONG FINGER AND CYST EXCISION;  Surgeon: Andrea Mesi, MD;  Location: [facility];  Service: Orthopedics;  Laterality: Left;   TRIGGER FINGER RELEASE Right 02/16/2021   Procedure: TRIGGER FINGER RELEASE RIGHT RING FINGER;  Surgeon: Andrea Mesi, MD;  Location: [facility];  Service: Orthopedics;  Laterality: Right;   TUBAL LIGATION     VAGINAL HYSTERECTOMY  2001   partial   Patient Active Problem List   Diagnosis Date Noted   Urinary symptom or sign 03/12/2023   Hirsutism 07/20/2022   Hyperglycemia due to type 2 diabetes  mellitus (HCC) 07/20/2022   Type 2 diabetes mellitus without complication, without long-term current use of insulin (HCC) 10/01/2018   Trigger finger, right ring finger 09/09/2015   Low back pain radiating to both legs 05/06/2012   Leg length discrepancy 05/06/2012   HYPERTENSION, BENIGN 04/11/2009   LEG EDEMA, BILATERAL 10/08/2008   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 10/17/2006   Carpal tunnel syndrome, right upper limb 10/17/2006    PCP: Andrea Star, MD   REFERRING PROVIDER: Diedra Fowler, MD   REFERRING DIAG:  Diagnosis  [phone] (ICD-10-CM) - Lumbar stenosis with neurogenic claudication    Rationale for Evaluation and Treatment: Rehabilitation  THERAPY DIAG:  Other low back pain  Pain in left hip  Pain in right hip  Difficulty in walking, not elsewhere classified  Muscle weakness (generalized)  ONSET DATE: 4-5 months ago after a fall due to knee buckling  SUBJECTIVE:  SUBJECTIVE STATEMENT: Andrea Montes reports good HEP compliance this week.  She had a positive response to PT in the pool and would like to continue that.  PERTINENT HISTORY:  See PMH above  PAIN:  NPRS scale: 2-5/10 this week Pain location: low back and bil hips Pain description: achy, sore, occasional throbbing Aggravating factors: sitting too long, transfers, walking too much (overuse) Relieving factors: epidural, ice/heat, pain meds, Mobic (1 x in the morning)  PRECAUTIONS:  None  WEIGHT BEARING RESTRICTIONS: No  FALLS:  Has patient fallen in last 6 months? Yes. Number of falls 1, pt stating her left knee buckled.   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  Spondylolisthesis seen at L4/5.  No other significant degenerative changes seen.   MRI of the lumbar spine from 09/12/2023 was previously independently reviewed and interpreted, showing central and lateral recess stenosis at L4/5.  Spondylolisthesis seen at L4/5.  Bilateral facet arthropathy at L4/5 and L5/S1.  PATIENT SURVEYS:  Patient-Specific Activity Scoring Scheme  "0" represents "unable to perform." "10" represents "able to perform at prior level. 0 1 2 3 4 5 6 7 8 9  10 (Date and Score)   Activity Eval  11/12/23  12/05/2023  1. walking  5  5  2. Getting out of chair  4  6  3. Getting out of bed 3 7  4.    5.    Score 12/3= 4 6   Total score = sum of the activity scores/number of activities Minimum detectable change (90%CI) for average score = 2 points Minimum detectable change (90%CI) for single activity score = 3 points  SCREENING FOR RED FLAGS: Bowel or bladder incontinence: No Cauda equina syndrome: No  COGNITION: Overall cognitive status: WFL normal      SENSATION: WFL   POSTURE:  Rounded shoulders, forward head, increased lumbar lordosis  PALPATION: TTP:   LUMBAR ROM:   Directional Preference Assessment: Centralization: Peripheralization:   AROM Eval 11/12/23 11/26/23  Flexion  30 40 c tightness  Extension 22 c pain 22 c tightness  Right lateral flexion 25  c pain 45  Left lateral flexion 12 tingling down left LE 32 c pain down left LE  Right rotation Limited 80% c pain Limited 75%  Left rotation Limited 80% c pain  Limited 75%   (Blank rows = not tested)  LOWER EXTREMITY ROM:      Right 11/12/23 Supine active  Left 11/12/23 Supine active  Hip flexion 88 84  Hip extension    Hip abduction    Hip adduction    Hip internal rotation    Hip external rotation    Knee flexion 110 105  Knee extension     (Blank rows = not tested)  LOWER EXTREMITY MMT:    MMT Right Eval Sitting  Left eval  Hip flexion 19.5 ppsi 17.6 ppsi  Hip extension    Hip abduction    Hip adduction    Hip internal rotation    Hip external rotation    Knee flexion 15.3  ppsi 19.9 ppsi  Knee extension 25.2 ppsi 14.6 ppsi    (Blank rows = not tested)  LUMBAR SPECIAL TESTS:  Slump test: Positive left  FUNCTIONAL TESTS:  11/12/23 30 seconds chair stand test:   GAIT: Wide BOS, decreased heel strike bilaterally                                                                                                                                                                                                                  Today's treatment 12/05/2023  Lumbar extension AROM 10 x 3 seconds Supine hamstrings stretch with other leg straight 4 x 20 seconds bilateral Yoga Bridge 10 x 5 seconds  Functional Activities (to avoid knee buckling, stairs, sit to stand): Double Leg Press 5 x each at 50#; 75#; 100#; 125# and 150# with slow eccentrics Single Leg Press 5 x each at 50#; 62# and 75# left only Pull to chest/Rows Blue Thera-band (postural correction) 20 x 3 seconds Sit to stand slow eccentrics hands PRN 5 x  Neuromuscular re-education: Tandem balance: eyes open; head turning; eyes closed 3 x 20 seconds each   11/29/2023 Pt seen for aquatic therapy today.  Treatment took place in  water 3.5-4.75 ft in depth at the [facility] pool. Temp of water was 91.  Pt entered/exited the pool via stairs   Pt requires the buoyancy and hydrostatic pressure of water for support, and to offload joints by unweighting joint load by at least 50 % in navel deep water and by at least 75-80% in chest to neck deep water.  Viscosity of the water is needed for resistance of strengthening. Water current perturbations provides challenge to standing balance requiring increased core activation.   Water exercises -Fwd walking, lateral walking and backwards walking horizontal width of pool  3 round trips each -Mini lunge stance push/pull with kickboard 15 reps with left foot fwd, 15 reps Right foot fwd -Standing shoulder extension/flexion X15 dumbells -Leg swings abd/add X 15 reps bilat -Leg swings flexion/extension X 15 bilat -Lunge step to pool wall X 10 bilat -Standing shoulder circles into H abd/add "stir the pot" X 15 each way -Standing shoulder abduction/adduction X 15 bilat with dumbell and in squat -Standing shoulder horizontal abd/add X 15 bilat with dumbells -Lumbar "L" stretch 5 sec X 10 flexion, then 5 sec X 10 into extension upon return -Seated on pool bench, cycling, leg swings vertical and horizontal   11/26/23:  Nustep: level 6 x 8 minutes UE/LE Standing hip abduction x 15  bil LE c UE support Standing hip extension x 15 bil LE c UE support Trunk wall extension on elbows x 10 holding 10 sec Rolling ball up the wall for thoracic extension at end range x 10 holding 3 sec See updated lumbar ROM measurements above Functional Activities:  Sit to stand: x 10  Rows: green TB 2 x 10 holding 3 sec for postural strengthening Leg Press: bil 50# 2 x 10 Manual Left hip distraction x 3 holding 30 sec   PATIENT EDUCATION:  Education details: HEP, POC Person educated: Patient Education method: Programmer, multimedia, Demonstration, Verbal cues, and Handouts Education comprehension:  verbalized understanding, returned demonstration, and verbal cues required  HOME EXERCISE PROGRAM: Access Code: [email or web address] URL: [email or web address] Date: 12/05/2023 Prepared by: Terral Ferrari  Exercises - Supine Lower Trunk Rotation  - 2 x daily - 7 x weekly - 4 reps - 30 seconds hold - Supine Bridge  - 2 x daily - 7 x weekly - 10 reps - 5-10 seconds  hold - Seated Hamstring Stretch  - 2 x daily - 7 x weekly - 2 sets - 3 reps - 30 seconds hold - Seated Small Alternating Straight Leg Lifts with Heel Touch  - 2 x daily - 7 x weekly - 2 sets - 10 reps - Standing Lumbar Extension at Wall - Forearms  - 5 x daily - 7 x weekly - 1 sets - 5 reps - 3 seconds hold - Standing Scapular Retraction  - 5 x daily - 7 x weekly - 1 sets - 5 reps - 5 second hold  Aquatic HEP Access Code: [email or web address] URL: [email or web address] Date: 11/29/2023 Prepared by: Jamee Mazzoni  Exercises - Standing March at [facility]  - 1 x daily - 2 x weekly - 15 reps - Standing Hip Flexion Extension at [employer]  - 1 x daily - 2 x weekly - 15 reps - Standing Hip Abduction Adduction at Pool Wall  - 1 x daily - 2 x weekly - 20 reps - Forward Walking  - 1 x daily - 2 x weekly - 4 sets - Backward Walking  - 1 x daily - 2 x weekly - 4 sets - Side Stepping  - 2 x daily - 2 x weekly - 4 sets - 10 reps - Bilateral Shoulder Horizontal Abduction Adduction AROM  - 1 x daily - 2 x weekly - 4 sets - Squat  - 1 x daily - 2 x weekly - 15 reps - Standing Single Leg Hip Circles  - 1 x daily - 2 x weekly - 1 sets - 20 reps  ASSESSMENT:  CLINICAL IMPRESSION: Kaleiah notes subjective and functional progress since starting physical therapy.  Her patient specific functional scale is now at 60% (was 40% at evaluation) and should continue to improve as her strength and endurance progress.  Dymond is on track to meet long-term goals within the current plan of care's timeframe.   OBJECTIVE IMPAIRMENTS: decreased mobility,  difficulty walking, decreased ROM, decreased strength, impaired flexibility, obesity, and pain.   ACTIVITY LIMITATIONS: lifting, bending, sitting, standing, squatting, stairs, transfers, and bed mobility  PARTICIPATION LIMITATIONS: cleaning, laundry, community activity, and occupation  PERSONAL FACTORS: 3+ comorbidities: see PMH above  are also affecting patient's functional outcome.   REHAB POTENTIAL: Good  CLINICAL DECISION MAKING: Stable/uncomplicated  EVALUATION COMPLEXITY: Low   GOALS: Goals reviewed with patient? Yes  SHORT TERM GOALS: (target date for Short term goals are 3 weeks 12/03/2023)  1. Patient will demonstrate  independent use of home exercise program to maintain progress from in clinic treatments.  Goal status: Met 11/15/2023  LONG TERM GOALS: (target dates for all long term goals are 10 weeks  01/24/2024)   1. Patient will demonstrate/report pain at worst less than or equal to 2/10 to facilitate minimal limitation in daily activity secondary to pain symptoms.  Goal status: Ongoing 12/05/2023   2. Patient will demonstrate independent use of home exercise program to facilitate ability to maintain/progress functional gains from skilled physical therapy services.  Goal status: Ongoing 12/05/2023   3. Patient will demonstrate Patient specific functional scale avg > or = 6 to indicate reduced disability due to condition.   Goal status: Met 12/05/2023   4. Patient will demonstrate lumbar extension 100 % WFL s symptoms to facilitate upright standing, walking posture at PLOF s limitation.  Goal status: New   5.  Pt will be able to perform 5 time sit to stand in </= 15 seconds with/without UE support.  Goal status: New   6.  Pt will improve her trunk rotation by 25% bilaterally.  Goal status: New   PLAN:  PT FREQUENCY: 1-2x/week  PT DURATION: 10 weeks  PLANNED INTERVENTIONS: Can include 16109- PT Re-evaluation, 97110-Therapeutic exercises, 97530- Therapeutic  activity, 97112- Neuromuscular re-education, [phone]- Self Care, 97140- Manual therapy, [phone]- Gait training, [phone]- Orthotic Fit/training, [phone]- Canalith repositioning, [id number]- Aquatic Therapy, [phone]- Electrical stimulation (unattended), 97750 Physical performance testing, [id number]- Electrical stimulation (manual), 97016- Vasopneumatic device, [id number]- Ultrasound, [id number]- Traction (mechanical), [id number]- Ionotophoresis 4mg /ml Dexamethasone, Patient/Family education, Balance training, Stair training, Taping, Dry Needling, Joint mobilization, Joint manipulation, Spinal manipulation, Spinal mobilization, Scar mobilization, Vestibular training, Visual/preceptual remediation/compensation, DME instructions, Cryotherapy, and Moist heat.  All performed as medically necessary.  All included unless contraindicated  PLAN FOR NEXT SESSION: Low back and hip abductors strengthening, posture and body mechanics work, consider DN to lumbar paraspinals and glutes (handout issued at eval).  Continue aquatic therapy as she feels like she benefits from this.      Cherlyn Cushing, PT, MPT 12/05/2023, 1:10 PM  Date of referral: 10/31/23 Referring provider: London Sheer, MD  Referring diagnosis?  Diagnosis  M48.062 (ICD-10-CM) - Lumbar stenosis with neurogenic claudication   Treatment diagnosis? (if different than referring diagnosis) M54.59, M25.552, M25.551, R26.2, M62.81  What was this (referring dx) caused by? Thana Ates of Condition: Initial Onset (within last 3 months)   Laterality: Both  Current Functional Measure Score: Other Patient specific activity score (4)  Objective measurements identify impairments when they are compared to normal values, the uninvolved extremity, and prior level of function.  [x]  Yes  []  No  Objective assessment of functional ability: Moderate functional limitations   Briefly describe symptoms: pain in low back, bilateral hips, c h/o fall and knee buckling  How did symptoms start:  fall 4-5 months ago  Average pain intensity:  Last 24 hours: 5/10  Past week: can reach 10/10   How often does the pt experience symptoms? Constantly  How much have the symptoms interfered with usual daily activities? Moderately  How has condition changed since care began at this facility? A little worse  In general, how is the patients overall health? Fair   BACK PAIN (STarT Back Screening Tool) Has pain spread down the leg(s) at some time in the last 2 weeks? Yes left leg Has there been pain in the shoulder or neck at some time in the last 2 weeks? Yes Has the pt only walked short  distances because of back pain? yes Has patient dressed more slowly because of back pain in the past 2 weeks? yes Does patient think it's not safe for a person with this condition to be physically active? no Does patient have worrying thoughts a lot of the time? no Does patient feel back pain is terrible and will never get any better? no Has patient stopped enjoying things they usually enjoy? yes

## 2023-12-09 ENCOUNTER — Encounter: Admitting: Physical Therapy

## 2023-12-11 ENCOUNTER — Encounter: Admitting: Physical Therapy

## 2023-12-11 ENCOUNTER — Encounter: Payer: Self-pay | Admitting: Physical Therapy

## 2023-12-11 ENCOUNTER — Ambulatory Visit (INDEPENDENT_AMBULATORY_CARE_PROVIDER_SITE_OTHER): Admitting: Physical Therapy

## 2023-12-11 DIAGNOSIS — M25551 Pain in right hip: Secondary | ICD-10-CM | POA: Diagnosis not present

## 2023-12-11 DIAGNOSIS — R262 Difficulty in walking, not elsewhere classified: Secondary | ICD-10-CM

## 2023-12-11 DIAGNOSIS — M25552 Pain in left hip: Secondary | ICD-10-CM | POA: Diagnosis not present

## 2023-12-11 DIAGNOSIS — M5459 Other low back pain: Secondary | ICD-10-CM | POA: Diagnosis not present

## 2023-12-11 DIAGNOSIS — M6281 Muscle weakness (generalized): Secondary | ICD-10-CM | POA: Diagnosis not present

## 2023-12-11 NOTE — Therapy (Signed)
 OUTPATIENT PHYSICAL THERAPY THORACOLUMBAR TREATMENT   Patient Name: Andrea Montes MRN: [id number] DOB:1955/10/01, 68 y.o., female Today's Date: 12/11/2023  END OF SESSION:  PT End of Session - 12/11/23 0806     Visit Number 7    Number of Visits 20    Date for PT Re-Evaluation 01/24/24    Authorization Type [employer] Medicare    PT Start Time 0800    PT Stop Time 0840    PT Time Calculation (min) 40 min    Activity Tolerance Patient tolerated treatment well;No increased pain;Patient limited by fatigue;Patient limited by pain    Behavior During Therapy [facility] for tasks assessed/performed                  Past Medical History:  Diagnosis Date   Anemia    no current med.   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   GERD (gastroesophageal reflux disease)    Heart murmur    last  echo- 2010   History of blood transfusion    History of shingles    Hyperlipidemia    Hypertension    borderline not on medicaiton at this time   Impingement syndrome of left shoulder region 10/2014   Joint pain    Migraines    Obesity    Rheumatoid arteritis (HCC)    Seasonal allergies    Sleep apnea    uses CPAP nightly   Trigger finger, left middle finger    Vitamin D  deficiency    Wears partial dentures    upper   Past Surgical History:  Procedure Laterality Date   CARPAL TUNNEL RELEASE Left 04/25/2006   CARPAL TUNNEL RELEASE Right 07/15/2018   Procedure:  RIGHT CARPAL TUNNEL RELEASE;  Surgeon: Andrea Mesi, MD;  Location: [facility] OR;  Service: Orthopedics;  Laterality: Right;   CLOSED MANIPULATION SHOULDER WITH STERIOD INJECTION Left 02/03/2015   Procedure: LEFT  MANIPULATION SHOULDER UNDER ANESTHESIA WITH INJECTION OF STEROID;  Surgeon: Andrea Cross, MD;  Location: [facility];  Service: Orthopedics;  Laterality: Left;   COLONOSCOPY     COLONOSCOPY WITH PROPOFOL   10/09/2011   POLYPECTOMY     RESECTION DISTAL CLAVICAL Left 11/04/2014   Procedure: RESECTION DISTAL CLAVICAL;  Surgeon: Andrea Cross, MD;  Location: [facility];  Service: Orthopedics;  Laterality: Left;   ROTATOR CUFF REPAIR  2010   SHOULDER ARTHROSCOPY WITH ROTATOR CUFF REPAIR Right 10/04/2008   SHOULDER ARTHROSCOPY WITH SUBACROMIAL DECOMPRESSION, ROTATOR CUFF REPAIR AND BICEP TENDON REPAIR Left 11/04/2014   Procedure: LEFT SHOULDER ARTHROSCOPY WITH DEBRIDEMENT, DISTAL CLAVICLE EXCISION, ACROMIOPLASTY, ROTATOR CUFF REPAIR AND BICEP TENODESIS;  Surgeon: Andrea Cross, MD;  Location: [facility];  Service: Orthopedics;  Laterality: Left;   TRIGGER FINGER RELEASE Right 02/03/2015   Procedure: RIGHT LONG FINGER TRIGGER RELEASE;  Surgeon: Andrea Cross, MD;  Location: [facility];  Service: Orthopedics;  Laterality: Right;   TRIGGER FINGER RELEASE Left 10/10/2017   Procedure: LEFT HAND RELEASE TRIGGER FINGER 3RD LONG FINGER AND CYST EXCISION;  Surgeon: Andrea Mesi, MD;  Location: [facility];  Service: Orthopedics;  Laterality: Left;   TRIGGER FINGER RELEASE Right 02/16/2021   Procedure: TRIGGER FINGER RELEASE RIGHT RING FINGER;  Surgeon: Andrea Mesi, MD;  Location: [facility];  Service: Orthopedics;  Laterality: Right;   TUBAL LIGATION     VAGINAL HYSTERECTOMY  2001   partial   Patient Active Problem List   Diagnosis Date Noted   Urinary symptom or sign 03/12/2023   Hirsutism 07/20/2022   Hyperglycemia due to type 2 diabetes  mellitus (HCC) 07/20/2022   Type 2 diabetes mellitus without complication, without long-term current use of insulin  (HCC) 10/01/2018   Trigger finger, right ring finger 09/09/2015   Low back pain radiating to both legs 05/06/2012   Leg length discrepancy 05/06/2012   HYPERTENSION, BENIGN 04/11/2009   LEG EDEMA, BILATERAL 10/08/2008   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 10/17/2006   Carpal tunnel syndrome, right upper limb 10/17/2006    PCP: Andrea Star, MD   REFERRING PROVIDER: Diedra Fowler, MD   REFERRING DIAG:  Diagnosis  [phone] (ICD-10-CM) - Lumbar stenosis with neurogenic claudication    Rationale for Evaluation and Treatment: Rehabilitation  THERAPY DIAG:  Other low back pain  Pain in left hip  Pain in right hip  Difficulty in walking, not elsewhere classified  Muscle weakness (generalized)  ONSET DATE: 4-5 months ago after a fall due to knee buckling  SUBJECTIVE:  SUBJECTIVE STATEMENT: Andrea Montes reporting her Rt hip is giving her difficulty this morning. Pt reporting good response to aquatic therapy and stating she has been [facility] to [facility] the [facility] her own to work in the pool.   PERTINENT HISTORY:  See PMH above  PAIN:  NPRS scale: 4/10 today Pain location: Rt hip worse than left Pain description: achy, sore, occasional throbbing Aggravating factors: sitting too long, transfers, walking too much (overuse) Relieving factors: epidural, ice/heat,  pain meds, Mobic  (1 x in the morning)  PRECAUTIONS: None  WEIGHT BEARING RESTRICTIONS: No  FALLS:  Has patient fallen in last 6 months? Yes. Number of falls 1, pt stating her left knee buckled.   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  Spondylolisthesis seen at L4/5.  No other significant degenerative changes seen.   MRI of the lumbar spine from 09/12/2023 was previously independently reviewed and interpreted, showing central and lateral recess stenosis at L4/5.  Spondylolisthesis seen at L4/5.  Bilateral facet arthropathy at L4/5 and L5/S1.  PATIENT SURVEYS:  Patient-Specific Activity Scoring Scheme  "0" represents "unable to perform." "10" represents "able to perform at prior level. 0 1 2 3 4 5 6 7 8 9  10 (Date and Score)   Activity Eval  11/12/23  12/05/2023  1. walking  5  5  2. Getting out of chair  4  6  3. Getting out of bed 3 7  4.    5.    Score 12/3= 4 6   Total score = sum of the activity scores/number of activities Minimum detectable change (90%CI) for average score = 2 points Minimum detectable change (90%CI) for single activity score = 3 points  SCREENING FOR RED FLAGS: Bowel or bladder incontinence: No Cauda equina syndrome: No  COGNITION: Overall cognitive status: WFL normal      SENSATION: WFL   POSTURE:  Rounded shoulders, forward head, increased lumbar lordosis  PALPATION: TTP:   LUMBAR ROM:   Directional Preference  Assessment: Centralization: Peripheralization:   AROM Eval 11/12/23 11/26/23  Flexion 30 40 c tightness  Extension 22 c pain 22 c tightness  Right lateral flexion 25  c pain 45  Left lateral flexion 12 tingling down left LE 32 c pain down left LE  Right rotation Limited 80% c pain Limited 75%  Left rotation Limited 80% c pain  Limited 75%   (Blank rows = not tested)  LOWER EXTREMITY ROM:      Right 11/12/23 Supine active  Left 11/12/23 Supine active  Hip flexion 88 84  Hip extension    Hip abduction    Hip adduction    Hip internal rotation    Hip external rotation    Knee flexion 110 105  Knee extension     (Blank rows = not tested)  LOWER EXTREMITY MMT:    MMT Right Eval Sitting  Left eval  Hip flexion 19.5 ppsi 17.6 ppsi  Hip extension    Hip abduction    Hip adduction    Hip internal rotation  Hip external rotation    Knee flexion 15.3 ppsi 19.9 ppsi  Knee extension 25.2 ppsi 14.6 ppsi    (Blank rows = not tested)  LUMBAR SPECIAL TESTS:  Slump test: Positive left  FUNCTIONAL TESTS:  11/12/23 30 seconds chair stand test:  12/11/23: 5 time sit to stand: 18.1 seconds no UE support (goal is under 15)  GAIT: Wide BOS, decreased heel strike bilaterally                                                                                                                                                                                                                  Today's Treatment TherEx Nustep: level 5 x 9 minutes UE/LE Standing hip extension 2 x 15 c UE support Standing hip abduction 2 x 15 c UE support  Neuro Re-Ed Opposite arm/leg in sitting for core strengthening and coordination x 20  TherAct Sit to stand: x 10  Manual:  Skilled palpation and active trigger point release during TPDN Handout issued in previous session and instructions reviwed this visit (see pt instructions) Trigger Point Dry Needling Initial Treatment: Pt instructed on Dry Needling  rational, procedures, and possible side effects. Pt instructed to expect mild to moderate muscle soreness later in the day and/or into the next day.  Pt instructed in methods to reduce muscle soreness. Pt instructed to continue prescribed HEP. Patient was educated on signs and symptoms of infection and other risk factors and advised to seek medical attention should they occur.  Patient verbalized understanding of these instructions and education.  Patient Verbal Consent Given: Yes Education Handout Provided: Previously Provided Muscles treated: Rt glutes Treatment response/outcome: local twitch response       12/05/2023  Lumbar extension AROM 10 x 3 seconds Supine hamstrings stretch with other leg straight 4 x 20 seconds bilateral Yoga Bridge 10 x 5 seconds  Functional Activities (to avoid knee buckling, stairs, sit to stand): Double Leg Press 5 x each at 50#; 75#; 100#; 125# and 150# with slow eccentrics Single Leg Press 5 x each at 50#; 62# and 75# left only Pull to chest/Rows Blue Thera-band (postural correction) 20 x 3 seconds Sit to stand slow eccentrics hands PRN 5 x  Neuromuscular re-education: Tandem balance: eyes open; head turning; eyes closed 3 x 20 seconds each   11/29/2023 Pt seen for aquatic therapy today.  Treatment took place in water 3.5-4.75 ft in depth at the [facility] pool. Temp of water was 91.  Pt entered/exited the pool via stairs  Pt requires the buoyancy and hydrostatic pressure of water for support, and to offload joints by unweighting joint load by at least 50 % in navel deep water and by at least 75-80% in chest to neck deep water.  Viscosity of the water is needed for resistance of strengthening. Water current perturbations provides challenge to standing balance requiring increased core activation.   Water exercises -Fwd walking, lateral walking and backwards walking horizontal width of pool  3 round trips each -Mini lunge stance push/pull  with kickboard 15 reps with left foot fwd, 15 reps Right foot fwd -Standing shoulder extension/flexion X15 dumbells -Leg swings abd/add X 15 reps bilat -Leg swings flexion/extension X 15 bilat -Lunge step to pool wall X 10 bilat -Standing shoulder circles into H abd/add "stir the pot" X 15 each way -Standing shoulder abduction/adduction X 15 bilat with dumbell and in squat -Standing shoulder horizontal abd/add X 15 bilat with dumbells -Lumbar "L" stretch 5 sec X 10 flexion, then 5 sec X 10 into extension upon return -Seated on pool bench, cycling, leg swings vertical and horizontal    PATIENT EDUCATION:  Education details: HEP, POC Person educated: Patient Education method: Programmer, multimedia, Demonstration, Verbal cues, and Handouts Education comprehension: verbalized understanding, returned demonstration, and verbal cues required  HOME EXERCISE PROGRAM: Access Code: [email or web address] URL: [email or web address] Date: 12/05/2023 Prepared by: Terral Ferrari  Exercises - Supine Lower Trunk Rotation  - 2 x daily - 7 x weekly - 4 reps - 30 seconds hold - Supine Bridge  - 2 x daily - 7 x weekly - 10 reps - 5-10 seconds  hold - Seated Hamstring Stretch  - 2 x daily - 7 x weekly - 2 sets - 3 reps - 30 seconds hold - Seated Small Alternating Straight Leg Lifts with Heel Touch  - 2 x daily - 7 x weekly - 2 sets - 10 reps - Standing Lumbar Extension at Wall - Forearms  - 5 x daily - 7 x weekly - 1 sets - 5 reps - 3 seconds hold - Standing Scapular Retraction  - 5 x daily - 7 x weekly - 1 sets - 5 reps - 5 second hold  Aquatic HEP Access Code: [email or web address] URL: [email or web address] Date: 11/29/2023 Prepared by: Jamee Mazzoni  Exercises - Standing March at [facility]  - 1 x daily - 2 x weekly - 15 reps - Standing Hip Flexion Extension at [employer]  - 1 x daily - 2 x weekly - 15 reps - Standing Hip Abduction Adduction at Pool Wall  - 1 x daily - 2 x weekly - 20 reps - Forward  Walking  - 1 x daily - 2 x weekly - 4 sets - Backward Walking  - 1 x daily - 2 x weekly - 4 sets - Side Stepping  - 2 x daily - 2 x weekly - 4 sets - 10 reps - Bilateral Shoulder Horizontal Abduction Adduction AROM  - 1 x daily - 2 x weekly - 4 sets - Squat  - 1 x daily - 2 x weekly - 15 reps - Standing Single Leg Hip Circles  - 1 x daily - 2 x weekly - 1 sets - 20 reps  ASSESSMENT:  CLINICAL IMPRESSION: Pt wishing to try DN this visit. Pt was instructed in precautions and agreeable. Pt issued handout at previous session. Pt with good response to DN techniques in her gluteal maximus and gluteal medius with multiple twitch responses noted. Pt also reporting good response to  aquatic therapy and she [facility] in conjunction with therapy appointments. Recommending continued skilled PT interventions to progress toward goals set.    OBJECTIVE IMPAIRMENTS: decreased mobility, difficulty walking, decreased ROM, decreased strength, impaired flexibility, obesity, and pain.   ACTIVITY LIMITATIONS: lifting, bending, sitting, standing, squatting, stairs, transfers, and bed mobility  PARTICIPATION LIMITATIONS: cleaning, laundry, community activity, and occupation  PERSONAL FACTORS: 3+ comorbidities: see PMH above  are also affecting patient's functional outcome.   REHAB POTENTIAL: Good  CLINICAL DECISION MAKING: Stable/uncomplicated  EVALUATION COMPLEXITY: Low   GOALS: Goals reviewed with patient? Yes  SHORT TERM GOALS: (target date for Short term goals are 3 weeks 12/03/2023)  1. Patient will demonstrate independent use of home exercise program to maintain progress from in clinic treatments.  Goal status: Met 11/15/2023  LONG TERM GOALS: (target dates for all long term goals are 10 weeks  01/24/2024)   1. Patient will demonstrate/report pain at worst less than or equal to 2/10 to facilitate minimal limitation in daily activity secondary to pain symptoms.  Goal  status: Ongoing 12/05/2023   2. Patient will demonstrate independent use of home exercise program to facilitate ability to maintain/progress functional gains from skilled physical therapy services.  Goal status: Ongoing 12/05/2023   3. Patient will demonstrate Patient specific functional scale avg > or = 6 to indicate reduced disability due to condition.   Goal status: Met 12/05/2023   4. Patient will demonstrate lumbar extension 100 % WFL s symptoms to facilitate upright standing, walking posture at PLOF s limitation.  Goal status: New   5.  Pt will be able to perform 5 time sit to stand in </= 15 seconds with/without UE support.  Goal status: on-going 12/11/23 (18.1 seconds no UE support)   6.  Pt will improve her trunk rotation by 25% bilaterally.  Goal status: New   PLAN:  PT FREQUENCY: 1-2x/week  PT DURATION: 10 weeks  PLANNED INTERVENTIONS: Can include 08657- PT Re-evaluation, 97110-Therapeutic exercises, 97530- Therapeutic activity, 97112- Neuromuscular re-education, [phone]- Self Care, 97140- Manual therapy, [phone]- Gait training, [phone]- Orthotic Fit/training, [phone]- Canalith repositioning, [id number]- Aquatic Therapy, [phone]- Electrical stimulation (unattended), 97750 Physical performance testing, [id number]- Electrical stimulation (manual), 97016- Vasopneumatic device, [id number]- Ultrasound, [id number]- Traction (mechanical), [id number]- Ionotophoresis 4mg /ml Dexamethasone , Patient/Family education, Balance training, Stair training, Taping, Dry Needling, Joint mobilization, Joint manipulation, Spinal manipulation, Spinal mobilization, Scar mobilization, Vestibular training, Visual/preceptual remediation/compensation, DME instructions, Cryotherapy, and Moist heat.  All performed as medically necessary.  All included unless contraindicated  PLAN FOR NEXT SESSION: Low back and hip abductors strengthening, posture and body mechanics work, consider DN to lumbar paraspinals and glutes (handout issued at eval).   Continue aquatic therapy as she feels like she benefits from this.   Marysue Sola, PT, MPT 12/11/2023, 9:02 AM    Date of referral: 10/31/23 Referring provider: Diedra Fowler, MD  Referring diagnosis?  Diagnosis  M48.062 (ICD-10-CM) - Lumbar stenosis with neurogenic claudication   Treatment diagnosis? (if different than referring diagnosis) M54.59, M25.552, M25.551, R26.2, M62.81  What was this (referring dx) caused by? Hercules Lombard of Condition: Initial Onset (within last 3 months)   Laterality: Both  Current Functional Measure Score: Other Patient specific activity score (4)  Objective measurements identify impairments when they are compared to normal values, the uninvolved extremity, and prior level of function.  [x]  Yes  []  No  Objective assessment of functional ability: Moderate functional limitations   Briefly describe symptoms: pain in low  back, bilateral hips, c h/o fall and knee buckling  How did symptoms start: fall 4-5 months ago  Average pain intensity:  Last 24 hours: 5/10  Past week: can reach 10/10   How often does the pt experience symptoms? Constantly  How much have the symptoms interfered with usual daily activities? Moderately  How has condition changed since care began at this facility? A little worse  In general, how is the patients overall health? Fair   BACK PAIN (STarT Back Screening Tool) Has pain spread down the leg(s) at some time in the last 2 weeks? Yes left leg Has there been pain in the shoulder or neck at some time in the last 2 weeks? Yes Has the pt only walked short distances because of back pain? yes Has patient dressed more slowly because of back pain in the past 2 weeks? yes Does patient think it's not safe for a person with this condition to be physically active? no Does patient have worrying thoughts a lot of the time? no Does patient feel back pain is terrible and will never get any better? no Has patient stopped  enjoying things they usually enjoy? yes

## 2023-12-11 NOTE — Patient Instructions (Signed)

## 2023-12-13 ENCOUNTER — Ambulatory Visit: Admitting: Physical Therapy

## 2023-12-13 ENCOUNTER — Encounter: Payer: Self-pay | Admitting: Physical Therapy

## 2023-12-13 ENCOUNTER — Encounter: Admitting: Physical Therapy

## 2023-12-13 DIAGNOSIS — M6281 Muscle weakness (generalized): Secondary | ICD-10-CM | POA: Diagnosis not present

## 2023-12-13 DIAGNOSIS — R262 Difficulty in walking, not elsewhere classified: Secondary | ICD-10-CM | POA: Diagnosis not present

## 2023-12-13 DIAGNOSIS — M25552 Pain in left hip: Secondary | ICD-10-CM

## 2023-12-13 DIAGNOSIS — M25551 Pain in right hip: Secondary | ICD-10-CM

## 2023-12-13 DIAGNOSIS — M5459 Other low back pain: Secondary | ICD-10-CM | POA: Diagnosis not present

## 2023-12-13 NOTE — Therapy (Signed)
 OUTPATIENT PHYSICAL THERAPY THORACOLUMBAR TREATMENT   Patient Name: Andrea Montes MRN: [id number] DOB:1956/03/15, 68 y.o., female Today's Date: 12/13/2023  END OF SESSION:  PT End of Session - 12/13/23 0806     Visit Number 8    Number of Visits 20    Date for PT Re-Evaluation 01/24/24    Authorization Type [employer] Medicare    PT Start Time 0800    PT Stop Time 0840    PT Time Calculation (min) 40 min    Activity Tolerance Patient tolerated treatment well;No increased pain;Patient limited by fatigue;Patient limited by pain    Behavior During Therapy [facility] for tasks assessed/performed                  Past Medical History:  Diagnosis Date   Anemia    no current med.   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   GERD (gastroesophageal reflux disease)    Heart murmur    last  echo- 2010   History of blood transfusion    History of shingles    Hyperlipidemia    Hypertension    borderline not on medicaiton at this time   Impingement syndrome of left shoulder region 10/2014   Joint pain    Migraines    Obesity    Rheumatoid arteritis (HCC)    Seasonal allergies    Sleep apnea    uses CPAP nightly   Trigger finger, left middle finger    Vitamin D  deficiency    Wears partial dentures    upper   Past Surgical History:  Procedure Laterality Date   CARPAL TUNNEL RELEASE Left 04/25/2006   CARPAL TUNNEL RELEASE Right 07/15/2018   Procedure:  RIGHT CARPAL TUNNEL RELEASE;  Surgeon: Jasmine Mesi, MD;  Location: [facility];  Service: Orthopedics;  Laterality: Right;   CLOSED MANIPULATION SHOULDER WITH STERIOD INJECTION Left 02/03/2015   Procedure: LEFT  MANIPULATION SHOULDER UNDER ANESTHESIA WITH INJECTION OF STEROID;  Surgeon: Sandie Cross, MD;  Location: [facility];  Service: Orthopedics;  Laterality: Left;   COLONOSCOPY     COLONOSCOPY WITH PROPOFOL   10/09/2011   POLYPECTOMY     RESECTION DISTAL CLAVICAL Left 11/04/2014   Procedure: RESECTION DISTAL CLAVICAL;  Surgeon: Sandie Cross, MD;  Location: [facility];  Service: Orthopedics;  Laterality: Left;   ROTATOR CUFF REPAIR  2010   SHOULDER ARTHROSCOPY WITH ROTATOR CUFF REPAIR Right 10/04/2008   SHOULDER ARTHROSCOPY WITH SUBACROMIAL DECOMPRESSION, ROTATOR CUFF REPAIR AND BICEP TENDON REPAIR Left 11/04/2014   Procedure: LEFT SHOULDER ARTHROSCOPY WITH DEBRIDEMENT, DISTAL CLAVICLE EXCISION, ACROMIOPLASTY, ROTATOR CUFF REPAIR AND BICEP TENODESIS;  Surgeon: Sandie Cross, MD;  Location: [facility];  Service: Orthopedics;  Laterality: Left;   TRIGGER FINGER RELEASE Right 02/03/2015   Procedure: RIGHT LONG FINGER TRIGGER RELEASE;  Surgeon: Sandie Cross, MD;  Location: [facility];  Service: Orthopedics;  Laterality: Right;   TRIGGER FINGER RELEASE Left 10/10/2017   Procedure: LEFT HAND RELEASE TRIGGER FINGER 3RD LONG FINGER AND CYST EXCISION;  Surgeon: Jasmine Mesi, MD;  Location: [facility];  Service: Orthopedics;  Laterality: Left;   TRIGGER FINGER RELEASE Right 02/16/2021   Procedure: TRIGGER FINGER RELEASE RIGHT RING FINGER;  Surgeon: Jasmine Mesi, MD;  Location: [facility];  Service: Orthopedics;  Laterality: Right;   TUBAL LIGATION     VAGINAL HYSTERECTOMY  2001   partial   Patient Active Problem List   Diagnosis Date Noted   Urinary symptom or sign 03/12/2023   Hirsutism 07/20/2022   Hyperglycemia due to type 2 diabetes  mellitus (HCC) 07/20/2022   Type 2 diabetes mellitus without complication, without long-term current use of insulin  (HCC) 10/01/2018   Trigger finger, right ring finger 09/09/2015   Low back pain radiating to both legs 05/06/2012   Leg length discrepancy 05/06/2012   HYPERTENSION, BENIGN 04/11/2009   LEG EDEMA, BILATERAL 10/08/2008   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 10/17/2006   Carpal tunnel syndrome, right upper limb 10/17/2006    PCP: Merl Star, MD   REFERRING PROVIDER: Diedra Fowler, MD   REFERRING DIAG:  Diagnosis  [phone] (ICD-10-CM) - Lumbar stenosis with neurogenic claudication    Rationale for Evaluation and Treatment: Rehabilitation  THERAPY DIAG:  Other low back pain  Pain in left hip  Pain in right hip  Difficulty in walking, not elsewhere classified  Muscle weakness (generalized)  ONSET DATE: 4-5 months ago after a fall due to knee buckling  SUBJECTIVE:  SUBJECTIVE STATEMENT: Relays hip had some pain this morning, she was sore after DN but felt it helped some   PERTINENT HISTORY:  See PMH above  PAIN:  NPRS scale: 4/10 today Pain location: Rt hip worse than left Pain description: achy, sore, occasional throbbing Aggravating factors: sitting too long, transfers, walking too much (overuse) Relieving factors: epidural, ice/heat, pain meds, Mobic  (1 x in the morning)  PRECAUTIONS: None  WEIGHT BEARING RESTRICTIONS: No  FALLS:  Has  patient fallen in last 6 months? Yes. Number of falls 1, pt stating her left knee buckled.   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  Spondylolisthesis seen at L4/5.  No other significant degenerative changes seen.   MRI of the lumbar spine from 09/12/2023 was previously independently reviewed and interpreted, showing central and lateral recess stenosis at L4/5.  Spondylolisthesis seen at L4/5.  Bilateral facet arthropathy at L4/5 and L5/S1.  PATIENT SURVEYS:  Patient-Specific Activity Scoring Scheme  "0" represents "unable to perform." "10" represents "able to perform at prior level. 0 1 2 3 4 5 6 7 8 9  10 (Date and Score)   Activity Eval  11/12/23  12/05/2023  1. walking  5  5  2. Getting out of chair  4  6  3. Getting out of bed 3 7  4.    5.    Score 12/3= 4 6   Total score = sum of the activity scores/number of activities Minimum detectable change (90%CI) for average score = 2 points Minimum detectable change (90%CI) for single activity score = 3 points  SCREENING FOR RED FLAGS: Bowel or bladder incontinence: No Cauda equina syndrome: No  COGNITION: Overall cognitive status: WFL normal      SENSATION: WFL   POSTURE:  Rounded shoulders, forward head, increased lumbar lordosis  PALPATION: TTP:   LUMBAR ROM:   Directional Preference Assessment: Centralization: Peripheralization:   AROM Eval 11/12/23 11/26/23  Flexion 30 40 c tightness  Extension 22 c pain 22 c tightness   Right lateral flexion 25  c pain 45  Left lateral flexion 12 tingling down left LE 32 c pain down left LE  Right rotation Limited 80% c pain Limited 75%  Left rotation Limited 80% c pain  Limited 75%   (Blank rows = not tested)  LOWER EXTREMITY ROM:      Right 11/12/23 Supine active  Left 11/12/23 Supine active  Hip flexion 88 84  Hip extension    Hip abduction    Hip adduction    Hip internal rotation    Hip external rotation    Knee flexion 110 105  Knee extension     (Blank rows = not tested)  LOWER EXTREMITY MMT:    MMT Right Eval Sitting  Left eval  Hip flexion 19.5 ppsi 17.6 ppsi  Hip extension    Hip abduction    Hip adduction    Hip internal rotation    Hip external rotation    Knee flexion 15.3 ppsi 19.9 ppsi  Knee extension 25.2  ppsi 14.6 ppsi    (Blank rows = not tested)  LUMBAR SPECIAL TESTS:  Slump test: Positive left  FUNCTIONAL TESTS:  11/12/23 30 seconds chair stand test:  12/11/23: 5 time sit to stand: 18.1 seconds no UE support (goal is under 15)  GAIT: Wide BOS, decreased heel strike bilaterally                                                                                                                                                                                                                  Today's Treatment 12/13/2023 Pt seen for aquatic therapy today.  Treatment took place in water 3.5-4.75 ft in depth at the [facility] pool. Temp of water was 91.  Pt entered/exited the pool via stairs   Pt requires the buoyancy and hydrostatic pressure of water for support, and to offload joints by unweighting joint load by at least 50 % in navel deep water and by at least 75-80% in chest to neck deep water.  Viscosity of the water is needed for resistance of strengthening. Water current perturbations provides challenge to standing balance requiring increased core activation.   Water exercises -Fwd walking, lateral walking and backwards  walking horizontal width of pool  3 round trips each -Mini lunge stance push/pull with kickboard 15 reps with left foot fwd, 15 reps Right foot fwd -Standing shoulder extension/flexion X15 dumbells -Leg swings abd/add X 15 reps bilat -Leg swings flexion/extension X 15 bilat -Lunge step to pool wall X 10 bilat -Standing shoulder circles into H abd/add "stir the pot" X 15 each way -Standing shoulder abduction/adduction X 15 bilat with dumbell and in squat -Standing shoulder horizontal abd/add X 20 bilat with dumbells -Standing push/pull water dumbells X 15 bilat -Standing marches X 15 bilat -tandem walk across pool 2 round trips -Lumbar "L" stretch 5 sec X 10 flexion, then 5 sec X 10 into extension upon return -Squats from first step of pool X 10 reps    12/11/23 TherEx Nustep: level 5 x 9 minutes UE/LE Standing hip extension 2 x 15 c UE support Standing hip abduction 2 x 15 c UE support  Neuro Re-Ed Opposite arm/leg in sitting for core strengthening and coordination x 20  TherAct Sit to stand: x 10  Manual:  Skilled palpation and active trigger point release during TPDN Handout issued in previous session and instructions reviwed this visit (see pt instructions) Trigger Point Dry Needling Initial Treatment: Pt instructed on Dry Needling rational, procedures, and possible side effects. Pt instructed  to expect mild to moderate muscle soreness later in the day and/or into the next day.  Pt instructed in methods to reduce muscle soreness. Pt instructed to continue prescribed HEP. Patient was educated on signs and symptoms of infection and other risk factors and advised to seek medical attention should they occur.  Patient verbalized understanding of these instructions and education.  Patient Verbal Consent Given: Yes Education Handout Provided: Previously Provided Muscles treated: Rt glutes Treatment response/outcome: local twitch response       12/05/2023  Lumbar extension  AROM 10 x 3 seconds Supine hamstrings stretch with other leg straight 4 x 20 seconds bilateral Yoga Bridge 10 x 5 seconds  Functional Activities (to avoid knee buckling, stairs, sit to stand): Double Leg Press 5 x each at 50#; 75#; 100#; 125# and 150# with slow eccentrics Single Leg Press 5 x each at 50#; 62# and 75# left only Pull to chest/Rows Blue Thera-band (postural correction) 20 x 3 seconds Sit to stand slow eccentrics hands PRN 5 x  Neuromuscular re-education: Tandem balance: eyes open; head turning; eyes closed 3 x 20 seconds each     PATIENT EDUCATION:  Education details: HEP, POC Person educated: Patient Education method: Programmer, multimedia, Demonstration, Verbal cues, and Handouts Education comprehension: verbalized understanding, returned demonstration, and verbal cues required  HOME EXERCISE PROGRAM: Access Code: [email or web address] URL: [email or web address] Date: 12/05/2023 Prepared by: Terral Ferrari  Exercises - Supine Lower Trunk Rotation  - 2 x daily - 7 x weekly - 4 reps - 30 seconds hold - Supine Bridge  - 2 x daily - 7 x weekly - 10 reps - 5-10 seconds  hold - Seated Hamstring Stretch  - 2 x daily - 7 x weekly - 2 sets - 3 reps - 30 seconds hold - Seated Small Alternating Straight Leg Lifts with Heel Touch  - 2 x daily - 7 x weekly - 2 sets - 10 reps - Standing Lumbar Extension at Wall - Forearms  - 5 x daily - 7 x weekly - 1 sets - 5 reps - 3 seconds hold - Standing Scapular Retraction  - 5 x daily - 7 x weekly - 1 sets - 5 reps - 5 second hold  Aquatic HEP Access Code: [email or web address] URL: [email or web address] Date: 11/29/2023 Prepared by: Jamee Mazzoni  Exercises - Standing March at [facility]  - 1 x daily - 2 x weekly - 15 reps - Standing Hip Flexion Extension at [employer]  - 1 x daily - 2 x weekly - 15 reps - Standing Hip Abduction Adduction at Pool Wall  - 1 x daily - 2 x weekly - 20 reps - Forward Walking  - 1 x daily - 2 x weekly - 4  sets - Backward Walking  - 1 x daily - 2 x weekly - 4 sets - Side Stepping  - 2 x daily - 2 x weekly - 4 sets - 10 reps - Bilateral Shoulder Horizontal Abduction Adduction AROM  - 1 x daily - 2 x weekly - 4 sets - Squat  - 1 x daily - 2 x weekly - 15 reps - Standing Single Leg Hip Circles  - 1 x daily - 2 x weekly - 1 sets - 20 reps  ASSESSMENT:  CLINICAL IMPRESSION: Good tolerance to aquatic PT session today and she demonstrates understanding of HEP.  Recommending continued skilled PT interventions to progress toward goals set.    OBJECTIVE IMPAIRMENTS: decreased mobility, difficulty walking, decreased ROM, decreased strength, impaired  flexibility, obesity, and pain.   ACTIVITY LIMITATIONS: lifting, bending, sitting, standing, squatting, stairs, transfers, and bed mobility  PARTICIPATION LIMITATIONS: cleaning, laundry, community activity, and occupation  PERSONAL FACTORS: 3+ comorbidities: see PMH above  are also affecting patient's functional outcome.   REHAB POTENTIAL: Good  CLINICAL DECISION MAKING: Stable/uncomplicated  EVALUATION COMPLEXITY: Low   GOALS: Goals reviewed with patient? Yes  SHORT TERM GOALS: (target date for Short term goals are 3 weeks 12/03/2023)  1. Patient will demonstrate independent use of home exercise program to maintain progress from in clinic treatments.  Goal status: Met 11/15/2023  LONG TERM GOALS: (target dates for all long term goals are 10 weeks  01/24/2024)   1. Patient will demonstrate/report pain at worst less than or equal to 2/10 to facilitate minimal limitation in daily activity secondary to pain symptoms.  Goal status: Ongoing 12/05/2023   2. Patient will demonstrate independent use of home exercise program to facilitate ability to maintain/progress functional gains from skilled physical therapy services.  Goal status: Ongoing 12/05/2023   3. Patient will demonstrate Patient specific functional scale avg > or = 6 to indicate reduced  disability due to condition.   Goal status: Met 12/05/2023   4. Patient will demonstrate lumbar extension 100 % WFL s symptoms to facilitate upright standing, walking posture at PLOF s limitation.  Goal status: New   5.  Pt will be able to perform 5 time sit to stand in </= 15 seconds with/without UE support.  Goal status: on-going 12/11/23 (18.1 seconds no UE support)   6.  Pt will improve her trunk rotation by 25% bilaterally.  Goal status: New   PLAN:  PT FREQUENCY: 1-2x/week  PT DURATION: 10 weeks  PLANNED INTERVENTIONS: Can include 16109- PT Re-evaluation, 97110-Therapeutic exercises, 97530- Therapeutic activity, 97112- Neuromuscular re-education, [phone]- Self Care, 97140- Manual therapy, [phone]- Gait training, [phone]- Orthotic Fit/training, [phone]- Canalith repositioning, [id number]- Aquatic Therapy, [phone]- Electrical stimulation (unattended), 97750 Physical performance testing, [id number]- Electrical stimulation (manual), 97016- Vasopneumatic device, [id number]- Ultrasound, [id number]- Traction (mechanical), [id number]- Ionotophoresis 4mg /ml Dexamethasone , Patient/Family education, Balance training, Stair training, Taping, Dry Needling, Joint mobilization, Joint manipulation, Spinal manipulation, Spinal mobilization, Scar mobilization, Vestibular training, Visual/preceptual remediation/compensation, DME instructions, Cryotherapy, and Moist heat.  All performed as medically necessary.  All included unless contraindicated  PLAN FOR NEXT SESSION: Low back and hip abductors strengthening, posture and body mechanics work. DN if desired    Mick Alamin, PT, DPT 12/13/2023, 8:07 AM    Date of referral: 10/31/23 Referring provider: Diedra Fowler, MD  Referring diagnosis?  Diagnosis  M48.062 (ICD-10-CM) - Lumbar stenosis with neurogenic claudication   Treatment diagnosis? (if different than referring diagnosis) M54.59, M25.552, M25.551, R26.2, M62.81  What was this (referring dx) caused by? Hercules Lombard  of Condition: Initial Onset (within last 3 months)   Laterality: Both  Current Functional Measure Score: Other Patient specific activity score (4)  Objective measurements identify impairments when they are compared to normal values, the uninvolved extremity, and prior level of function.  [x]  Yes  []  No  Objective assessment of functional ability: Moderate functional limitations   Briefly describe symptoms: pain in low back, bilateral hips, c h/o fall and knee buckling  How did symptoms start: fall 4-5 months ago  Average pain intensity:  Last 24 hours: 5/10  Past week: can reach 10/10   How often does the pt experience symptoms? Constantly  How much have the symptoms interfered with usual daily activities? Moderately  How has condition  changed since care began at this facility? A little worse  In general, how is the patients overall health? Fair   BACK PAIN (STarT Back Screening Tool) Has pain spread down the leg(s) at some time in the last 2 weeks? Yes left leg Has there been pain in the shoulder or neck at some time in the last 2 weeks? Yes Has the pt only walked short distances because of back pain? yes Has patient dressed more slowly because of back pain in the past 2 weeks? yes Does patient think it's not safe for a person with this condition to be physically active? no Does patient have worrying thoughts a lot of the time? no Does patient feel back pain is terrible and will never get any better? no Has patient stopped enjoying things they usually enjoy? yes

## 2023-12-16 ENCOUNTER — Ambulatory Visit (INDEPENDENT_AMBULATORY_CARE_PROVIDER_SITE_OTHER): Admitting: Physical Therapy

## 2023-12-16 ENCOUNTER — Encounter: Payer: Self-pay | Admitting: Physical Therapy

## 2023-12-16 DIAGNOSIS — M5459 Other low back pain: Secondary | ICD-10-CM | POA: Diagnosis not present

## 2023-12-16 DIAGNOSIS — M25552 Pain in left hip: Secondary | ICD-10-CM | POA: Diagnosis not present

## 2023-12-16 DIAGNOSIS — M25551 Pain in right hip: Secondary | ICD-10-CM | POA: Diagnosis not present

## 2023-12-16 DIAGNOSIS — M6281 Muscle weakness (generalized): Secondary | ICD-10-CM

## 2023-12-16 DIAGNOSIS — R262 Difficulty in walking, not elsewhere classified: Secondary | ICD-10-CM | POA: Diagnosis not present

## 2023-12-16 NOTE — Therapy (Addendum)
 OUTPATIENT PHYSICAL THERAPY THORACOLUMBAR TREATMENT DISCHARGE   Patient Name: Andrea Montes MRN: [id number] DOB:[birth year], 68 y.o., female Today's Date: 12/16/2023  END OF SESSION:  PT End of Session - 12/16/23 0811     Visit Number 9    Number of Visits 20    Date for PT Re-Evaluation 01/24/24    Authorization Type [employer] Medicare    PT Start Time 0803    PT Stop Time 0843    PT Time Calculation (min) 40 min    Activity Tolerance Patient tolerated treatment well;No increased pain;Patient limited by fatigue;Patient limited by pain    Behavior During Therapy [facility] for tasks assessed/performed                  Past Medical History:  Diagnosis Date   Anemia    no current med.   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   GERD (gastroesophageal reflux disease)    Heart murmur    last  echo- 2010   History of blood transfusion    History of shingles    Hyperlipidemia    Hypertension    borderline not on medicaiton at this time   Impingement syndrome of left shoulder region 10/2014   Joint pain    Migraines    Obesity    Rheumatoid arteritis (HCC)    Seasonal allergies    Sleep apnea    uses CPAP nightly   Trigger finger, left middle finger    Vitamin D  deficiency    Wears partial dentures    upper   Past Surgical History:  Procedure Laterality Date   CARPAL TUNNEL RELEASE Left 04/25/2006   CARPAL TUNNEL RELEASE Right 07/15/2018    Procedure: RIGHT CARPAL TUNNEL RELEASE;  Surgeon: Addie Cordella Hamilton, MD;  Location: [facility] OR;  Service: Orthopedics;  Laterality: Right;   CLOSED MANIPULATION SHOULDER WITH STERIOD INJECTION Left 02/03/2015   Procedure: LEFT  MANIPULATION SHOULDER UNDER ANESTHESIA WITH INJECTION OF STEROID;  Surgeon: Toribio Chancy, MD;  Location: [facility];  Service: Orthopedics;  Laterality: Left;   COLONOSCOPY     COLONOSCOPY WITH PROPOFOL   10/09/2011   POLYPECTOMY     RESECTION DISTAL CLAVICAL Left 11/04/2014   Procedure: RESECTION DISTAL CLAVICAL;  Surgeon: Toribio Chancy, MD;  Location: [facility];  Service: Orthopedics;  Laterality: Left;   ROTATOR CUFF REPAIR  2010   SHOULDER ARTHROSCOPY WITH ROTATOR CUFF REPAIR Right 10/04/2008   SHOULDER ARTHROSCOPY WITH SUBACROMIAL DECOMPRESSION, ROTATOR CUFF REPAIR AND BICEP TENDON REPAIR Left 11/04/2014   Procedure: LEFT SHOULDER ARTHROSCOPY WITH DEBRIDEMENT, DISTAL CLAVICLE EXCISION, ACROMIOPLASTY, ROTATOR CUFF REPAIR AND BICEP TENODESIS;  Surgeon: Toribio Chancy, MD;  Location: [facility];  Service: Orthopedics;  Laterality: Left;   TRIGGER FINGER RELEASE Right 02/03/2015   Procedure: RIGHT LONG FINGER TRIGGER RELEASE;  Surgeon: Toribio Chancy, MD;  Location: [facility];  Service: Orthopedics;  Laterality: Right;   TRIGGER FINGER RELEASE Left 10/10/2017   Procedure: LEFT HAND RELEASE TRIGGER FINGER 3RD LONG FINGER AND CYST EXCISION;  Surgeon: Addie Cordella Hamilton, MD;  Location: [facility];  Service: Orthopedics;  Laterality: Left;   TRIGGER FINGER RELEASE Right 02/16/2021   Procedure: TRIGGER FINGER RELEASE RIGHT RING FINGER;  Surgeon: Addie Cordella Hamilton, MD;  Location: [facility] OR;  Service: Orthopedics;  Laterality: Right;   TUBAL LIGATION     VAGINAL HYSTERECTOMY  2001   partial   Patient Active Problem List   Diagnosis Date Noted   Urinary symptom or sign 03/12/2023   Hirsutism 07/20/2022   Hyperglycemia due to type 2  diabetes mellitus (HCC) 07/20/2022   Type 2 diabetes mellitus without complication, without long-term current use of insulin  (HCC) 10/01/2018   Trigger finger, right ring finger 09/09/2015   Low back pain radiating to both legs 05/06/2012   Leg length discrepancy 05/06/2012   HYPERTENSION, BENIGN 04/11/2009   LEG EDEMA, BILATERAL 10/08/2008   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 10/17/2006   Carpal tunnel syndrome, right upper limb 10/17/2006    PCP: Rexanne Ingle, MD   REFERRING PROVIDER: Georgina Ozell LABOR, MD   REFERRING DIAG:  Diagnosis  [phone] (ICD-10-CM) - Lumbar stenosis with neurogenic claudication    Rationale for Evaluation and Treatment: Rehabilitation  THERAPY DIAG:  Other low back pain  Pain in left hip  Pain in right hip  Difficulty in walking, not elsewhere classified  Muscle weakness (generalized)  ONSET DATE: 4-5 months ago after a fall due to knee buckling  SUBJECTIVE:  SUBJECTIVE STATEMENT: I had a busy weekend with working several parties.   PERTINENT HISTORY:  See PMH above  PAIN:  NPRS scale: 3/10 today Pain location: Rt hip worse than left Pain description: achy, sore, occasional throbbing Aggravating factors: sitting too long, transfers, walking too much (overuse) Relieving factors: epidural, ice/heat, pain meds, Mobic  (1 x in the morning)  PRECAUTIONS: None  WEIGHT BEARING RESTRICTIONS: No  FALLS:  Has patient fallen in last 6  months? Yes. Number of falls 1, pt stating her left knee buckled.   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  Spondylolisthesis seen at L4/5.  No other significant degenerative changes seen.   MRI of the lumbar spine from 09/12/2023 was previously independently reviewed and interpreted, showing central and lateral recess stenosis at L4/5.  Spondylolisthesis seen at L4/5.  Bilateral facet arthropathy at L4/5 and L5/S1.  PATIENT SURVEYS:  Patient-Specific Activity Scoring Scheme  0 represents "unable to perform." 10 represents "able to perform at prior level. 0 1 2 3 4 5 6 7 8 9  10 (Date and Score)   Activity Eval  11/12/23  12/05/2023  1. walking  5  5  2. Getting out of chair  4  6  3. Getting out of bed 3 7  4.    5.    Score 12/3= 4 6   Total score = sum of the activity scores/number of activities Minimum detectable change (90%CI) for average score = 2 points Minimum detectable change (90%CI) for single activity score = 3 points  SCREENING FOR RED FLAGS: Bowel or bladder incontinence: No Cauda equina syndrome: No  COGNITION: Overall cognitive status: WFL normal      SENSATION: WFL   POSTURE:  Rounded shoulders, forward head, increased lumbar lordosis  PALPATION: TTP:   LUMBAR ROM:   Directional Preference Assessment: Centralization: Peripheralization:   AROM Eval 11/12/23 11/26/23 12/16/23  Flexion 30 40 c tightness 50  Extension 22 c pain 22 c tightness 25  Right  lateral flexion 25  c pain 45 45  Left lateral flexion 12 tingling down left LE 32 c pain down left LE 42  Right rotation Limited 80% c pain Limited 75% WFL  Left rotation Limited 80% c pain  Limited 75% WFL   (Blank rows = not tested)  LOWER EXTREMITY ROM:      Right 11/12/23 Supine active  Left 11/12/23 Supine active Rt / Left 12/16/23 Supine active  Hip flexion 88 84 112 / 110  Hip extension     Hip abduction     Hip adduction     Hip internal rotation     Hip external rotation     Knee flexion 110 105 125 / 125  Knee extension      (Blank rows = not tested)  LOWER EXTREMITY MMT:    MMT Right Eval Sitting  Left eval Rt / left 12/16/23   Hip flexion 19.5 ppsi 17.6 ppsi 39.7 / 37.3  Hip extension     Hip abduction  Hip adduction     Hip internal rotation     Hip external rotation     Knee flexion 15.3 ppsi 19.9 ppsi 44.6 / 44.7  Knee extension 25.2 ppsi 14.6 ppsi  42.0 / 42.6   (Blank rows = not tested)  LUMBAR SPECIAL TESTS:  Slump test: Positive left  FUNCTIONAL TESTS:  11/12/23 30 seconds chair stand test:  12/11/23: 5 time sit to stand: 18.1 seconds no UE support (goal is under 15)  GAIT: Wide BOS, decreased heel strike bilaterally                                                                                                                                                                                                                  Today's Treatment 12/16/23:  TherEx ROM and MMT updated see charts above Nustep: level 7 x 6 minutes, level 6 x 2 minutes UE/LE Standing hip extension: x 15 bil LE c UE support  Neuro Re-Ed Sliding vectors x 10 bil LE c single UE support on sliding disc reaching ant/lat and post/lat, cues for abdominal activation  TherAct Leg Press: 81# bil LE's x 20 Leg Press single LE: 43l# x 15  Sit to stand: x 10 from mat table in lowest position c no UE support        12/13/2023 Pt seen for aquatic therapy today.   Treatment took place in water 3.5-4.75 ft in depth at the [facility] pool. Temp of water was 91.  Pt entered/exited the pool via stairs   Pt requires the buoyancy and hydrostatic pressure of water for support, and to offload joints by unweighting joint load by at least 50 % in navel deep water and by at least 75-80% in chest to neck deep water.  Viscosity of the water is needed for resistance of strengthening. Water current perturbations provides challenge to standing balance requiring increased core activation.   Water exercises -Fwd walking, lateral walking and backwards walking horizontal width of pool  3 round trips each -Mini lunge stance push/pull with kickboard 15 reps with left foot fwd, 15 reps Right foot fwd -Standing shoulder extension/flexion X15 dumbells -Leg swings abd/add X 15 reps bilat -Leg swings flexion/extension X 15 bilat -Lunge step to pool wall X 10 bilat -Standing shoulder circles into H abd/add stir the pot X 15 each way -Standing shoulder abduction/adduction X 15 bilat with dumbell and in squat -Standing shoulder horizontal abd/add X 20 bilat with dumbells -Standing push/pull water dumbells X 15 bilat -Standing marches  X 15 bilat -tandem walk across pool 2 round trips -Lumbar L stretch 5 sec X 10 flexion, then 5 sec X 10 into extension upon return -Squats from first step of pool X 10 reps    12/11/23 TherEx Nustep: level 5 x 9 minutes UE/LE Standing hip extension 2 x 15 c UE support Standing hip abduction 2 x 15 c UE support  Neuro Re-Ed Opposite arm/leg in sitting for core strengthening and coordination x 20  TherAct Sit to stand: x 10  Manual:  Skilled palpation and active trigger point release during TPDN Handout issued in previous session and instructions reviwed this visit (see pt instructions) Trigger Point Dry Needling Initial Treatment: Pt instructed on Dry Needling rational, procedures, and possible side effects. Pt instructed to  expect mild to moderate muscle soreness later in the day and/or into the next day.  Pt instructed in methods to reduce muscle soreness. Pt instructed to continue prescribed HEP. Patient was educated on signs and symptoms of infection and other risk factors and advised to seek medical attention should they occur.  Patient verbalized understanding of these instructions and education.  Patient Verbal Consent Given: Yes Education Handout Provided: Previously Provided Muscles treated: Rt glutes Treatment response/outcome: local twitch response       PATIENT EDUCATION:  Education details: HEP, POC Person educated: Patient Education method: Programmer, multimedia, Demonstration, Verbal cues, and Handouts Education comprehension: verbalized understanding, returned demonstration, and verbal cues required  HOME EXERCISE PROGRAM: Access Code: [email or web address] URL: [email or web address] Date: 12/05/2023 Prepared by: Lamar Ivory  Exercises - Supine Lower Trunk Rotation  - 2 x daily - 7 x weekly - 4 reps - 30 seconds hold - Supine Bridge  - 2 x daily - 7 x weekly - 10 reps - 5-10 seconds  hold - Seated Hamstring Stretch  - 2 x daily - 7 x weekly - 2 sets - 3 reps - 30 seconds hold - Seated Small Alternating Straight Leg Lifts with Heel Touch  - 2 x daily - 7 x weekly - 2 sets - 10 reps - Standing Lumbar Extension at Wall - Forearms  - 5 x daily - 7 x weekly - 1 sets - 5 reps - 3 seconds hold - Standing Scapular Retraction  - 5 x daily - 7 x weekly - 1 sets - 5 reps - 5 second hold  Aquatic HEP Access Code: [email or web address] URL: [email or web address] Date: 11/29/2023 Prepared by: Redell Moose  Exercises - Standing March at [facility]  - 1 x daily - 2 x weekly - 15 reps - Standing Hip Flexion Extension at [employer]  - 1 x daily - 2 x weekly - 15 reps - Standing Hip Abduction Adduction at Pool Wall  - 1 x daily - 2 x weekly - 20 reps - Forward Walking  - 1 x daily - 2 x weekly - 4  sets - Backward Walking  - 1 x daily - 2 x weekly - 4 sets - Side Stepping  - 2 x daily - 2 x weekly - 4 sets - 10 reps - Bilateral Shoulder Horizontal Abduction Adduction AROM  - 1 x daily - 2 x weekly - 4 sets - Squat  - 1 x daily - 2 x weekly - 15 reps - Standing Single Leg Hip Circles  - 1 x daily - 2 x weekly - 1 sets - 20 reps  ASSESSMENT:  CLINICAL IMPRESSION: Pt has met both her ROM and MMT goals set at her initial evaluation.  After reporting a busy weekend with increased work, pt reporting less pain with trunk mobility. Pt still reporting stiffness when first waking and after sitting prolonged. We discussed skilled PT discharge at her next visit.    OBJECTIVE IMPAIRMENTS: decreased mobility, difficulty walking, decreased ROM, decreased strength, impaired flexibility, obesity, and pain.   ACTIVITY LIMITATIONS: lifting, bending, sitting, standing, squatting, stairs, transfers, and bed mobility  PARTICIPATION LIMITATIONS: cleaning, laundry, community activity, and occupation  PERSONAL FACTORS: 3+ comorbidities: see PMH above are also affecting patient's functional outcome.   REHAB POTENTIAL: Good  CLINICAL DECISION MAKING: Stable/uncomplicated  EVALUATION COMPLEXITY: Low   GOALS: Goals reviewed with patient? Yes  SHORT TERM GOALS: (target date for Short term goals are 3 weeks 12/03/2023)  1. Patient will demonstrate independent use of home exercise program to maintain progress from in clinic treatments.  Goal status: Met 11/15/2023  LONG TERM GOALS: (target dates for all long term goals are 10 weeks  01/24/2024)   1. Patient will demonstrate/report pain at worst less than or equal to 2/10 to facilitate minimal limitation in daily activity secondary to pain symptoms.  Goal status: Ongoing 12/05/2023   2. Patient will demonstrate independent use of home exercise program to facilitate ability to maintain/progress functional gains from skilled physical therapy  services.  Goal status: MET 12/16/23   3. Patient will demonstrate Patient specific functional scale avg > or = 6 to indicate reduced disability due to condition.   Goal status: Met 12/05/2023   4. Patient will demonstrate lumbar extension 100 % WFL s symptoms to facilitate upright standing, walking posture at PLOF s limitation.  Goal status: MET 12/16/23   5.  Pt will be able to perform 5 time sit to stand in </= 15 seconds with/without UE support.  Goal status: on-going 12/11/23 (18.1 seconds no UE support)   6.  Pt will improve her trunk rotation by 25% bilaterally.  Goal status: MET 12/16/23   PLAN:  PT FREQUENCY: 1-2x/week  PT DURATION: 10 weeks  PLANNED INTERVENTIONS: Can include 02853- PT Re-evaluation, 97110-Therapeutic exercises, 97530- Therapeutic activity, 97112- Neuromuscular re-education, 97535- Self Care, 97140- Manual therapy, [phone]- Gait training, [phone]- Orthotic Fit/training, [phone]- Canalith repositioning, [id number]- Aquatic Therapy, [phone]- Electrical stimulation (unattended), 97750 Physical performance testing, [id number]- Electrical stimulation (manual), 97016- Vasopneumatic device, [id number]- Ultrasound, [id number]- Traction (mechanical), [id number]- Ionotophoresis 4mg /ml Dexamethasone , Patient/Family education, Balance training, Stair training, Taping, Dry Needling, Joint mobilization, Joint manipulation, Spinal manipulation, Spinal mobilization, Scar mobilization, Vestibular training, Visual/preceptual remediation/compensation, DME instructions, Cryotherapy, and Moist heat.  All performed as medically necessary.  All included unless contraindicated  PLAN FOR NEXT SESSION: Low back and hip abductors strengthening, posture and body mechanics work. DN if desired    Delon JONELLE Lunger, PT, MPT 12/16/2023, 8:50 AM    Date of referral: 10/31/23 Referring provider: Georgina Ozell LABOR, MD  Referring diagnosis?  Diagnosis  M48.062 (ICD-10-CM) - Lumbar stenosis with neurogenic claudication    Treatment diagnosis? (if different than referring diagnosis) M54.59, M25.552, M25.551, R26.2, M62.81  What was this (referring dx) caused by? Felton Hawks of Condition: Initial Onset (within last 3 months)   Laterality: Both  Current Functional Measure Score: Other Patient specific activity score (4)  Objective measurements identify impairments when they are compared to normal values, the uninvolved extremity, and prior level of function.  [x]  Yes  []  No  Objective assessment of functional ability: Moderate functional limitations   Briefly describe symptoms: pain in low back, bilateral hips, c h/o fall and knee buckling  How did symptoms start: fall 4-5 months ago  Average pain intensity:  Last 24 hours: 5/10  Past week: can reach 10/10   How often does the pt experience symptoms? Constantly  How much have the symptoms interfered with usual daily activities? Moderately  How has condition changed since care began at this facility? A little worse  In general, how is the patients overall health? Fair   BACK PAIN (STarT Back Screening Tool) Has pain spread down the leg(s) at some time in the last 2 weeks? Yes left leg Has there been pain in the shoulder or neck at some time in the last 2 weeks? Yes Has the pt only walked short distances because of back pain? yes Has patient dressed more slowly because of back pain in the past 2 weeks? yes Does patient think it's not safe for a person with this condition to be physically active? no Does patient have worrying thoughts a lot of the time? no Does patient feel back pain is terrible and will never get any better? no Has patient stopped enjoying things they usually enjoy? yes  PHYSICAL THERAPY DISCHARGE SUMMARY  Visits from Start of Care: 9  Current functional level related to goals / functional outcomes: See above   Remaining deficits: See above   Education / Equipment: HEP   Patient agrees to discharge. Patient  goals were partially met. Patient is being discharged due to not returning since the last visit. Delon Lunger, PT MPT 02/19/24 10:16 AM

## 2023-12-18 ENCOUNTER — Encounter: Admitting: Physical Therapy

## 2023-12-20 DIAGNOSIS — I10 Essential (primary) hypertension: Secondary | ICD-10-CM | POA: Diagnosis not present

## 2023-12-20 DIAGNOSIS — E1169 Type 2 diabetes mellitus with other specified complication: Secondary | ICD-10-CM | POA: Diagnosis not present

## 2023-12-20 DIAGNOSIS — E7800 Pure hypercholesterolemia, unspecified: Secondary | ICD-10-CM | POA: Diagnosis not present

## 2023-12-20 DIAGNOSIS — G4730 Sleep apnea, unspecified: Secondary | ICD-10-CM | POA: Diagnosis not present

## 2023-12-20 DIAGNOSIS — K5900 Constipation, unspecified: Secondary | ICD-10-CM | POA: Diagnosis not present

## 2023-12-23 ENCOUNTER — Encounter: Payer: Self-pay | Admitting: Orthopedic Surgery

## 2023-12-23 ENCOUNTER — Ambulatory Visit (INDEPENDENT_AMBULATORY_CARE_PROVIDER_SITE_OTHER): Admitting: Orthopedic Surgery

## 2023-12-23 DIAGNOSIS — M48062 Spinal stenosis, lumbar region with neurogenic claudication: Secondary | ICD-10-CM | POA: Diagnosis not present

## 2023-12-23 NOTE — Progress Notes (Signed)
 Orthopedic Spine Surgery Office Note   Assessment: Patient is a 68 y.o. female with low back pain that radiates into the bilateral lateral hips and thighs. 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 Symptoms consistent with neurogenic claudication     Plan: -Patient has tried Tylenol , Aleve, medrol  dose pak, ESI -Since she is doing better with therapy, encouraged her to continue with the home exercise program -Would consider sending her back to therapy if pain returns as she had good result with that treatment -Patient would need to get to a BMI of 40 or less prior to any elective spine surgery -Patient should return to office on an as needed basis     Patient expressed understanding of the plan and all questions were answered to the patient's satisfaction.    ___________________________________________________________________________     History:   Patient is a 68 y.o. female who presents today for follow up on her lumbar spine.  Patient has been doing significantly better since she was last seen in the office.  She is not really having any pain in her back or radiating into her lateral hips or thighs.  She said every once a while she gets some pain radiating into her lateral thighs.  She usually notices this when she gets up from a seated position.  She has not noticed any new symptoms since she was last seen in the office.   Treatments tried: PT, Tylenol , Aleve, medrol  dosepak, ESI     Physical Exam:   General: no acute distress, appears stated age Neurologic: alert, answering questions appropriately, following commands Respiratory: unlabored breathing on room air, symmetric chest rise Psychiatric: appropriate affect, normal cadence to speech     MSK (spine):   -Strength exam                                                   Left                  Right EHL                              5/5                  5/5 TA                                 5/5                   5/5 GSC                             5/5                  5/5 Knee extension            5/5                  5/5 Hip flexion                    5/5                  5/5   -Sensory exam  Sensation intact to light touch in L3-S1 nerve distributions of bilateral lower extremities     Imaging: XRs of the lumbar spine from 08/28/2023 were previously independently reviewed and interpreted, showing disc height loss at L4/5 and L5/S1.  Spondylolisthesis seen at L4/5.  No other significant degenerative changes seen.   MRI of the lumbar spine from 09/12/2023 was previously independently reviewed and interpreted, showing central and lateral recess stenosis at L4/5.  Spondylolisthesis seen at L4/5.  Bilateral facet arthropathy at L4/5 and L5/S1.     Patient name: Andrea Montes Patient MRN: [id number] Date of visit: 12/23/23

## 2023-12-24 ENCOUNTER — Encounter: Admitting: Physical Therapy

## 2023-12-25 ENCOUNTER — Encounter: Admitting: Physical Therapy

## 2023-12-31 ENCOUNTER — Other Ambulatory Visit (HOSPITAL_COMMUNITY): Payer: Self-pay

## 2023-12-31 MED ORDER — MOUNJARO 7.5 MG/0.5ML ~~LOC~~ SOAJ
7.5000 mg | SUBCUTANEOUS | 0 refills | Status: AC
  Filled 2023-12-31: qty 2, 28d supply, fill #0

## 2024-01-02 ENCOUNTER — Other Ambulatory Visit: Payer: Self-pay | Admitting: Podiatry

## 2024-01-02 ENCOUNTER — Other Ambulatory Visit (HOSPITAL_COMMUNITY): Payer: Self-pay

## 2024-01-02 DIAGNOSIS — R21 Rash and other nonspecific skin eruption: Secondary | ICD-10-CM

## 2024-01-02 DIAGNOSIS — L299 Pruritus, unspecified: Secondary | ICD-10-CM

## 2024-01-03 ENCOUNTER — Other Ambulatory Visit (HOSPITAL_COMMUNITY): Payer: Self-pay

## 2024-01-03 MED ORDER — TRIAMCINOLONE ACETONIDE 0.1 % EX OINT
TOPICAL_OINTMENT | Freq: Two times a day (BID) | CUTANEOUS | 0 refills | Status: AC
Start: 2024-01-03 — End: ?
  Filled 2024-01-03: qty 454, 90d supply, fill #0

## 2024-01-18 DIAGNOSIS — I10 Essential (primary) hypertension: Secondary | ICD-10-CM | POA: Diagnosis not present

## 2024-01-18 DIAGNOSIS — E7800 Pure hypercholesterolemia, unspecified: Secondary | ICD-10-CM | POA: Diagnosis not present

## 2024-01-18 DIAGNOSIS — E1169 Type 2 diabetes mellitus with other specified complication: Secondary | ICD-10-CM | POA: Diagnosis not present

## 2024-01-30 ENCOUNTER — Other Ambulatory Visit (HOSPITAL_COMMUNITY): Payer: Self-pay

## 2024-01-30 MED ORDER — MOUNJARO 10 MG/0.5ML ~~LOC~~ SOAJ
10.0000 mg | SUBCUTANEOUS | 3 refills | Status: DC
  Filled 2024-01-30: qty 2, 28d supply, fill #0
  Filled 2024-02-28: qty 2, 28d supply, fill #1

## 2024-02-17 DIAGNOSIS — E7800 Pure hypercholesterolemia, unspecified: Secondary | ICD-10-CM | POA: Diagnosis not present

## 2024-02-17 DIAGNOSIS — E1169 Type 2 diabetes mellitus with other specified complication: Secondary | ICD-10-CM | POA: Diagnosis not present

## 2024-02-17 DIAGNOSIS — I10 Essential (primary) hypertension: Secondary | ICD-10-CM | POA: Diagnosis not present

## 2024-02-28 ENCOUNTER — Other Ambulatory Visit (HOSPITAL_COMMUNITY): Payer: Self-pay

## 2024-02-28 MED ORDER — ACCU-CHEK SOFTCLIX LANCETS MISC
3 refills | Status: AC
  Filled 2024-02-28: qty 100, 90d supply, fill #0
  Filled 2024-07-17: qty 100, 90d supply, fill #1

## 2024-02-28 MED ORDER — MOUNJARO 10 MG/0.5ML ~~LOC~~ SOAJ
10.0000 mg | SUBCUTANEOUS | 3 refills | Status: DC
  Filled 2024-02-28: qty 6, 84d supply, fill #0

## 2024-02-28 MED ORDER — ACCU-CHEK GUIDE ME W/DEVICE KIT
PACK | 0 refills | Status: DC
  Filled 2024-02-28: qty 1, 30d supply, fill #0

## 2024-02-28 MED ORDER — ACCU-CHEK GUIDE TEST VI STRP
ORAL_STRIP | 3 refills | Status: AC
  Filled 2024-02-28: qty 100, 90d supply, fill #0
  Filled 2024-07-17: qty 100, 90d supply, fill #1

## 2024-02-29 DIAGNOSIS — N390 Urinary tract infection, site not specified: Secondary | ICD-10-CM | POA: Diagnosis not present

## 2024-03-19 DIAGNOSIS — E1169 Type 2 diabetes mellitus with other specified complication: Secondary | ICD-10-CM | POA: Diagnosis not present

## 2024-03-19 DIAGNOSIS — E7800 Pure hypercholesterolemia, unspecified: Secondary | ICD-10-CM | POA: Diagnosis not present

## 2024-03-19 DIAGNOSIS — I10 Essential (primary) hypertension: Secondary | ICD-10-CM | POA: Diagnosis not present

## 2024-03-31 ENCOUNTER — Other Ambulatory Visit (HOSPITAL_COMMUNITY): Payer: Self-pay

## 2024-03-31 ENCOUNTER — Other Ambulatory Visit: Payer: Self-pay

## 2024-03-31 MED ORDER — LOSARTAN POTASSIUM 50 MG PO TABS
50.0000 mg | ORAL_TABLET | Freq: Every day | ORAL | 1 refills | Status: AC
  Filled 2024-03-31: qty 90, 90d supply, fill #0
  Filled 2024-07-10: qty 90, 90d supply, fill #1

## 2024-03-31 MED ORDER — FUROSEMIDE 20 MG PO TABS
20.0000 mg | ORAL_TABLET | Freq: Every day | ORAL | 1 refills | Status: AC
  Filled 2024-03-31: qty 90, 90d supply, fill #0
  Filled 2024-07-10 (×2): qty 90, 90d supply, fill #1

## 2024-03-31 MED ORDER — METFORMIN HCL ER 750 MG PO TB24
1500.0000 mg | ORAL_TABLET | Freq: Every day | ORAL | 1 refills | Status: AC
  Filled 2024-03-31: qty 180, 90d supply, fill #0
  Filled 2024-07-10 (×2): qty 180, 90d supply, fill #1

## 2024-04-03 ENCOUNTER — Other Ambulatory Visit (HOSPITAL_COMMUNITY): Payer: Self-pay

## 2024-04-07 ENCOUNTER — Other Ambulatory Visit: Payer: Self-pay

## 2024-04-19 DIAGNOSIS — E7800 Pure hypercholesterolemia, unspecified: Secondary | ICD-10-CM | POA: Diagnosis not present

## 2024-04-19 DIAGNOSIS — E1169 Type 2 diabetes mellitus with other specified complication: Secondary | ICD-10-CM | POA: Diagnosis not present

## 2024-04-19 DIAGNOSIS — I10 Essential (primary) hypertension: Secondary | ICD-10-CM | POA: Diagnosis not present

## 2024-05-19 DIAGNOSIS — I10 Essential (primary) hypertension: Secondary | ICD-10-CM | POA: Diagnosis not present

## 2024-05-19 DIAGNOSIS — E1169 Type 2 diabetes mellitus with other specified complication: Secondary | ICD-10-CM | POA: Diagnosis not present

## 2024-05-19 DIAGNOSIS — E7800 Pure hypercholesterolemia, unspecified: Secondary | ICD-10-CM | POA: Diagnosis not present

## 2024-05-28 ENCOUNTER — Other Ambulatory Visit (HOSPITAL_COMMUNITY): Payer: Self-pay

## 2024-05-28 MED ORDER — MOUNJARO 10 MG/0.5ML ~~LOC~~ SOAJ
10.0000 mg | SUBCUTANEOUS | 1 refills | Status: DC
  Filled 2024-05-28: qty 6, 84d supply, fill #0

## 2024-06-08 ENCOUNTER — Other Ambulatory Visit (HOSPITAL_COMMUNITY): Payer: Self-pay

## 2024-06-09 DIAGNOSIS — Z5181 Encounter for therapeutic drug level monitoring: Secondary | ICD-10-CM | POA: Diagnosis not present

## 2024-06-09 DIAGNOSIS — Z23 Encounter for immunization: Secondary | ICD-10-CM | POA: Diagnosis not present

## 2024-06-09 DIAGNOSIS — E7800 Pure hypercholesterolemia, unspecified: Secondary | ICD-10-CM | POA: Diagnosis not present

## 2024-06-09 DIAGNOSIS — I10 Essential (primary) hypertension: Secondary | ICD-10-CM | POA: Diagnosis not present

## 2024-06-09 DIAGNOSIS — Z0000 Encounter for general adult medical examination without abnormal findings: Secondary | ICD-10-CM | POA: Diagnosis not present

## 2024-06-09 DIAGNOSIS — E1169 Type 2 diabetes mellitus with other specified complication: Secondary | ICD-10-CM | POA: Diagnosis not present

## 2024-06-09 DIAGNOSIS — G4730 Sleep apnea, unspecified: Secondary | ICD-10-CM | POA: Diagnosis not present

## 2024-06-11 ENCOUNTER — Other Ambulatory Visit (HOSPITAL_COMMUNITY): Payer: Self-pay

## 2024-06-11 MED ORDER — MOUNJARO 12.5 MG/0.5ML ~~LOC~~ SOAJ
12.5000 mg | SUBCUTANEOUS | 11 refills | Status: AC
  Filled 2024-06-11 – 2024-06-16 (×3): qty 2, 28d supply, fill #0
  Filled 2024-07-13: qty 6, 84d supply, fill #1
  Filled 2024-08-28: qty 6, 84d supply, fill #2

## 2024-06-16 ENCOUNTER — Other Ambulatory Visit (HOSPITAL_COMMUNITY): Payer: Self-pay

## 2024-06-19 ENCOUNTER — Ambulatory Visit (INDEPENDENT_AMBULATORY_CARE_PROVIDER_SITE_OTHER): Admitting: Orthopedic Surgery

## 2024-06-19 ENCOUNTER — Other Ambulatory Visit (HOSPITAL_COMMUNITY): Payer: Self-pay

## 2024-06-19 DIAGNOSIS — M48062 Spinal stenosis, lumbar region with neurogenic claudication: Secondary | ICD-10-CM | POA: Diagnosis not present

## 2024-06-19 DIAGNOSIS — E1169 Type 2 diabetes mellitus with other specified complication: Secondary | ICD-10-CM | POA: Diagnosis not present

## 2024-06-19 DIAGNOSIS — M5416 Radiculopathy, lumbar region: Secondary | ICD-10-CM

## 2024-06-19 DIAGNOSIS — M545 Low back pain, unspecified: Secondary | ICD-10-CM

## 2024-06-19 DIAGNOSIS — I10 Essential (primary) hypertension: Secondary | ICD-10-CM | POA: Diagnosis not present

## 2024-06-19 DIAGNOSIS — E7800 Pure hypercholesterolemia, unspecified: Secondary | ICD-10-CM | POA: Diagnosis not present

## 2024-06-19 MED ORDER — TRAMADOL HCL 50 MG PO TABS
50.0000 mg | ORAL_TABLET | Freq: Three times a day (TID) | ORAL | 0 refills | Status: AC | PRN
  Filled 2024-06-19: qty 21, 7d supply, fill #0
  Filled 2024-08-28: qty 9, 3d supply, fill #1

## 2024-06-21 ENCOUNTER — Encounter: Payer: Self-pay | Admitting: Orthopedic Surgery

## 2024-06-21 NOTE — Progress Notes (Signed)
 Office Visit Note   Patient: Andrea Montes           Date of Birth: [birth year]           MRN: [id number] Visit Date: 06/19/2024 Requested by: Rexanne Ingle, MD [address] [place],  [place] [address] PCP: Rexanne Ingle, MD  Subjective: Chief Complaint  Patient presents with   Lower Back - Pain    HPI: Andrea Montes is a 68 y.o. female who presents to the office reporting back pain and bilateral hip pain.  Patient did have MRI scan of her lumbar spine in January of this year which showed stenosis as well as degenerative changes.  Right hip radiographs 2 years ago did not show too much in terms of arthritis.  Did have an epidural steroid injection with Dr. Eldonna in February.  Has seen Dr. Georgina who recommended surgery once her BMI decreases.  She is currently taking weight loss medication orally.  She was on a bus trip last month to [place]  which significantly increased her back and buttock pain.  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  Describes some radicular pain as well as numbness and tingling in that posterior buttock region radiating down to the thighs.  Pain does wake her from sleep at times..                ROS: All systems reviewed are negative as they relate to the chief complaint within the history of present illness.  Patient denies fevers or chills.  Assessment & Plan: Visit Diagnoses:  1. Lumbar radiculopathy   2. Lumbar stenosis with neurogenic claudication   3. Low back pain, unspecified back pain laterality, unspecified chronicity, unspecified whether sciatica present     Plan: Impression is low back pain with bilateral radicular pain.  Would like to refer her to Dr. Eldonna for lumbar spine ESI.  Continue with weight loss and follow-up with Dr. Georgina when she reaches her target weight.  Tramadol  prescription 1 time provided.  This has helped her in the past.  Follow-Up Instructions: No follow-ups on file.    Orders:  Orders Placed This Encounter  Procedures   Ambulatory referral to Physical Medicine Rehab   Meds ordered this encounter  Medications   traMADol  (ULTRAM ) 50 MG tablet    Sig: Take 1 tablet (50 mg total) by mouth every 8 (eight) hours as needed.    Dispense:  30 tablet    Refill:  0      Procedures: No procedures performed   Clinical Data: No additional findings.  Objective: Vital Signs: There were no vitals taken for this visit.  Physical Exam:  Constitutional: Patient appears well-developed HEENT:  Head: Normocephalic Eyes:EOM are normal Neck: Normal range of motion Cardiovascular: Normal rate Pulmonary/chest: Effort normal Neurologic: Patient is alert Skin: Skin is warm Psychiatric: Patient has normal mood and affect  Ortho Exam: Ortho exam demonstrates no trochanteric tenderness.  Minimal to no pain with internal and external rotation of either hip.  Pedal pulses palpable.  Ankle dorsiflexion and plantarflexion quad and hamstring strength is intact.  No muscle atrophy in either leg.  No paresthesias L1-S1 bilaterally  Specialty Comments:  MRI LUMBAR SPINE WITHOUT CONTRAST   TECHNIQUE: Multiplanar, multisequence MR imaging of the lumbar spine was performed. No intravenous contrast was administered.   COMPARISON:  05/17/2014   FINDINGS: Segmentation:  Standard.   Alignment: Minimal grade 1 anterolisthesis of  L4 on L5 and L5 on S1.   Vertebrae: No acute fracture, evidence of discitis, or aggressive bone lesion.   Conus medullaris and cauda equina: Conus extends to the L2 level. Conus and cauda equina appear normal.   Paraspinal and other soft tissues: No acute paraspinal abnormality.   Disc levels:   Disc spaces: Mild disc height loss L5-S1.   T12-L1: No significant disc bulge. No neural foraminal stenosis. No central canal stenosis.   L1-L2: No significant disc bulge. No neural foraminal stenosis. No central canal stenosis.   L2-L3:  Mild broad-based disc bulge. Mild bilateral facet arthropathy. No foraminal or central canal stenosis.   L3-L4: No significant disc bulge. Mild bilateral facet arthropathy. No foraminal or central canal stenosis.   L4-L5: No disc herniation. Severe bilateral facet arthropathy. Moderate central canal stenosis. Bilateral lateral recess stenosis. Mild bilateral foraminal stenosis.   L5-S1: No disc herniation. Severe bilateral facet arthropathy. Mild bilateral foraminal stenosis. No central canal stenosis.   IMPRESSION: 1. At L4-5 there is a no disc herniation. Severe bilateral facet arthropathy. Moderate central canal stenosis. Bilateral lateral recess stenosis. Mild bilateral foraminal stenosis. 2. At L5-S1 there is a no disc herniation. Severe bilateral facet arthropathy. Mild bilateral foraminal stenosis. 3. No acute osseous injury of the lumbar spine.     Electronically Signed   By: Julaine Blanch M.D.   On: 09/21/2023 09:11  Imaging: No results found.   PMFS History: Patient Active Problem List   Diagnosis Date Noted   Urinary symptom or sign 03/12/2023   Hirsutism 07/20/2022   Hyperglycemia due to type 2 diabetes mellitus (HCC) 07/20/2022   Type 2 diabetes mellitus without complication, without long-term current use of insulin  (HCC) 10/01/2018   Trigger finger, right ring finger 09/09/2015   Low back pain radiating to both legs 05/06/2012   Leg length discrepancy 05/06/2012   HYPERTENSION, BENIGN 04/11/2009   LEG EDEMA, BILATERAL 10/08/2008   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 10/17/2006   Carpal tunnel syndrome, right upper limb 10/17/2006   Past Medical History:  Diagnosis Date   Anemia    no current med.   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   GERD (gastroesophageal reflux disease)    Heart murmur    last  echo- 2010   History of blood transfusion    History of shingles    Hyperlipidemia    Hypertension    borderline not on medicaiton at this time   Impingement syndrome of left shoulder region 10/2014   Joint pain    Migraines    Obesity    Rheumatoid arteritis (HCC)    Seasonal allergies    Sleep apnea    uses CPAP nightly   Trigger  finger, left middle finger    Vitamin D  deficiency    Wears partial dentures    upper    Family History  Problem Relation Age of Onset   Kidney disease Brother    Hypertension Brother    Leukemia Mother    Heart disease Mother    Stroke Mother    Cancer Mother    Depression Mother    Anxiety disorder Mother    AAA (abdominal aortic aneurysm) Father    Esophageal cancer Maternal Uncle    Hypertension Brother    Colon cancer Neg Hx    Colon polyps Neg Hx    Rectal cancer Neg Hx    Stomach cancer Neg Hx     Past Surgical History:  Procedure Laterality Date   CARPAL TUNNEL RELEASE Left 04/25/2006   CARPAL TUNNEL RELEASE Right 07/15/2018   Procedure: RIGHT CARPAL TUNNEL RELEASE;  Surgeon: Addie Cordella Hamilton, MD;  Location: [facility] OR;  Service: Orthopedics;  Laterality: Right;   CLOSED MANIPULATION SHOULDER WITH STERIOD INJECTION Left 02/03/2015   Procedure: LEFT  MANIPULATION SHOULDER UNDER ANESTHESIA WITH INJECTION OF STEROID;  Surgeon: Toribio Chancy, MD;  Location: [facility];  Service: Orthopedics;   Laterality: Left;   COLONOSCOPY     COLONOSCOPY WITH PROPOFOL   10/09/2011   POLYPECTOMY     RESECTION DISTAL CLAVICAL Left 11/04/2014   Procedure: RESECTION DISTAL CLAVICAL;  Surgeon: Toribio Chancy, MD;  Location: [facility];  Service: Orthopedics;  Laterality: Left;   ROTATOR CUFF REPAIR  2010   SHOULDER ARTHROSCOPY WITH ROTATOR CUFF REPAIR Right 10/04/2008   SHOULDER ARTHROSCOPY WITH SUBACROMIAL DECOMPRESSION, ROTATOR CUFF REPAIR AND BICEP TENDON REPAIR Left 11/04/2014   Procedure: LEFT SHOULDER ARTHROSCOPY WITH DEBRIDEMENT, DISTAL CLAVICLE EXCISION, ACROMIOPLASTY, ROTATOR CUFF REPAIR AND BICEP TENODESIS;  Surgeon: Toribio Chancy, MD;  Location: [facility];  Service: Orthopedics;  Laterality: Left;   TRIGGER FINGER RELEASE Right 02/03/2015   Procedure: RIGHT LONG FINGER TRIGGER RELEASE;  Surgeon: Toribio Chancy, MD;  Location:  SURGERY CENTER;  Service: Orthopedics;  Laterality: Right;   TRIGGER FINGER RELEASE Left 10/10/2017   Procedure: LEFT HAND RELEASE TRIGGER FINGER 3RD LONG FINGER AND CYST EXCISION;  Surgeon: Addie Cordella Hamilton, MD;  Location: [facility] OR;  Service: Orthopedics;  Laterality: Left;   TRIGGER FINGER RELEASE Right 02/16/2021   Procedure: TRIGGER FINGER RELEASE RIGHT RING FINGER;  Surgeon: Addie Cordella Hamilton, MD;  Location: [facility] OR;  Service: Orthopedics;  Laterality: Right;   TUBAL LIGATION     VAGINAL HYSTERECTOMY  2001   partial   Social History   Occupational History   Not on file  Tobacco Use   Smoking status: Never   Smokeless tobacco: Never  Vaping Use   Vaping status: Never Used  Substance and Sexual Activity   Alcohol use: Not Currently    Comment: occ with a special occ   Drug use: No   Sexual activity: Not on file

## 2024-06-22 ENCOUNTER — Other Ambulatory Visit (HOSPITAL_COMMUNITY): Payer: Self-pay

## 2024-06-22 ENCOUNTER — Encounter: Payer: Self-pay | Admitting: Radiology

## 2024-06-22 MED ORDER — METHOCARBAMOL 750 MG PO TABS
750.0000 mg | ORAL_TABLET | Freq: Three times a day (TID) | ORAL | 1 refills | Status: AC | PRN
  Filled 2024-06-22 – 2024-08-28 (×2): qty 30, 10d supply, fill #0

## 2024-07-02 ENCOUNTER — Other Ambulatory Visit (HOSPITAL_COMMUNITY): Payer: Self-pay

## 2024-07-10 ENCOUNTER — Other Ambulatory Visit: Payer: Self-pay

## 2024-07-10 ENCOUNTER — Other Ambulatory Visit (HOSPITAL_COMMUNITY): Payer: Self-pay

## 2024-07-10 MED ORDER — ATORVASTATIN CALCIUM 10 MG PO TABS
10.0000 mg | ORAL_TABLET | Freq: Every day | ORAL | 1 refills | Status: AC
  Filled 2024-07-10: qty 90, 90d supply, fill #0

## 2024-07-13 ENCOUNTER — Other Ambulatory Visit (HOSPITAL_COMMUNITY): Payer: Self-pay

## 2024-07-13 ENCOUNTER — Other Ambulatory Visit: Payer: Self-pay

## 2024-07-13 ENCOUNTER — Ambulatory Visit (INDEPENDENT_AMBULATORY_CARE_PROVIDER_SITE_OTHER): Admitting: Physical Medicine and Rehabilitation

## 2024-07-13 VITALS — BP 137/81 | HR 79

## 2024-07-13 DIAGNOSIS — M5416 Radiculopathy, lumbar region: Secondary | ICD-10-CM | POA: Diagnosis not present

## 2024-07-13 DIAGNOSIS — M48062 Spinal stenosis, lumbar region with neurogenic claudication: Secondary | ICD-10-CM

## 2024-07-13 MED ORDER — METHYLPREDNISOLONE ACETATE 40 MG/ML IJ SUSP: 40.0000 mg | Freq: Once | INTRAMUSCULAR | Status: AC

## 2024-07-13 MED ORDER — MELOXICAM 15 MG PO TABS
15.0000 mg | ORAL_TABLET | Freq: Every day | ORAL | 3 refills | Status: AC
Start: 2024-07-13 — End: ?
  Filled 2024-07-13: qty 90, 90d supply, fill #0

## 2024-07-13 NOTE — Progress Notes (Signed)
 Pain Scale   Average Pain 7 Patient advising she has lower back pain radiating to right side. Patient advising er pain is constant        +Driver, -BT, -Dye Allergies.

## 2024-07-13 NOTE — Progress Notes (Signed)
 Andrea Montes - 68 y.o. female MRN [id number]  Date of birth: 12-10-1955  Office Visit Note: Visit Date: 07/13/2024 PCP: Rexanne Ingle, MD Referred by: Rexanne Ingle, MD  Subjective: Chief Complaint  Patient presents with   Lower Back - Pain   HPI:  Andrea Montes is a 68 y.o. female who comes in today at the request of Dr. JUDITHANN Glendia Hutchinson for planned Right L4-5 Lumbar Interlaminar epidural steroid injection with fluoroscopic guidance.  The patient has failed conservative care including home exercise, medications, time and activity modification.  This injection will be diagnostic and hopefully therapeutic.  Please see requesting physician notes for further details and justification.   I did elect today to complete injection L5-S1 due to anatomy of arthritic level and mor el% and S1 symptoms.   ROS Otherwise per HPI.  Assessment & Plan: Visit Diagnoses:    ICD-10-CM   1. Lumbar radiculopathy  M54.16 XR C-ARM NO REPORT    Epidural Steroid injection    methylPREDNISolone  acetate (DEPO-MEDROL ) injection 40 mg    2. Lumbar stenosis with neurogenic claudication  M48.062 XR C-ARM NO REPORT    Epidural Steroid injection    methylPREDNISolone  acetate (DEPO-MEDROL ) injection 40 mg      Plan: No additional findings.   Meds & Orders:  Meds ordered this encounter  Medications   methylPREDNISolone  acetate (DEPO-MEDROL ) injection 40 mg    Orders Placed This Encounter  Procedures   XR C-ARM NO REPORT   Epidural Steroid injection    Follow-up: No follow-ups on file.   Procedures: No procedures performed  Lumbar Epidural Steroid Injection - Interlaminar Approach with Fluoroscopic Guidance  Patient: Andrea Montes      Date of Birth: [birth year] MRN: [id number] PCP: Rexanne Ingle, MD      Visit Date: 07/13/2024   Universal Protocol:     Consent Given By: the patient  Position: PRONE  Additional Comments: Vital signs were monitored before and after the  procedure. Patient was prepped and draped in the usual sterile fashion. The correct patient, procedure, and site was verified.   Injection Procedure Details:   Procedure diagnoses: Lumbar radiculopathy [M54.16]   Meds Administered:  Meds ordered this encounter  Medications   methylPREDNISolone  acetate (DEPO-MEDROL ) injection 40 mg     Laterality: Right  Location/Site:  L4-5  Needle: 4.5 in., 20 ga. Tuohy  Needle Placement: Paramedian epidural  Findings:   -Comments: Excellent flow of contrast into the epidural space.  Procedure Details: Using a paramedian approach from the side mentioned above, the region overlying the inferior lamina was localized under fluoroscopic visualization and the soft tissues overlying this structure were infiltrated with 4 ml. of 1% Lidocaine  without Epinephrine . The Tuohy needle was inserted into the epidural space using a paramedian approach.   The epidural space was localized using loss of resistance along with counter oblique bi-planar fluoroscopic views.  After negative aspirate for air, blood, and CSF, a 2 ml. volume of Isovue -250 was injected into the epidural space and the flow of contrast was observed. Radiographs were obtained for documentation purposes.    The injectate was administered into the level noted above.   Additional Comments:  The patient tolerated the procedure well Dressing: 2 x 2 sterile gauze and Band-Aid    Post-procedure details: Patient was observed during the procedure. Post-procedure instructions were reviewed.  Patient left the clinic in stable condition.   Clinical History: MRI LUMBAR SPINE WITHOUT CONTRAST   TECHNIQUE: Multiplanar, multisequence MR imaging  of the lumbar spine was performed. No intravenous contrast was administered.   COMPARISON:  05/17/2014   FINDINGS: Segmentation:  Standard.   Alignment: Minimal grade 1 anterolisthesis of L4 on L5 and L5 on S1.   Vertebrae: No acute fracture,  evidence of discitis, or aggressive bone lesion.   Conus medullaris and cauda equina: Conus extends to the L2 level. Conus and cauda equina appear normal.   Paraspinal and other soft tissues: No acute paraspinal abnormality.   Disc levels:   Disc spaces: Mild disc height loss L5-S1.   T12-L1: No significant disc bulge. No neural foraminal stenosis. No central canal stenosis.   L1-L2: No significant disc bulge. No neural foraminal stenosis. No central canal stenosis.   L2-L3: Mild broad-based disc bulge. Mild bilateral facet arthropathy. No foraminal or central canal stenosis.   L3-L4: No significant disc bulge. Mild bilateral facet arthropathy. No foraminal or central canal stenosis.   L4-L5: No disc herniation. Severe bilateral facet arthropathy. Moderate central canal stenosis. Bilateral lateral recess stenosis. Mild bilateral foraminal stenosis.   L5-S1: No disc herniation. Severe bilateral facet arthropathy. Mild bilateral foraminal stenosis. No central canal stenosis.   IMPRESSION: 1. At L4-5 there is a no disc herniation. Severe bilateral facet arthropathy. Moderate central canal stenosis. Bilateral lateral recess stenosis. Mild bilateral foraminal stenosis. 2. At L5-S1 there is a no disc herniation. Severe bilateral facet arthropathy. Mild bilateral foraminal stenosis. 3. No acute osseous injury of the lumbar spine.     Electronically Signed   By: Julaine Blanch M.D.   On: 09/21/2023 09:11     Objective:  VS:  HT:    WT:   BMI:     BP:137/81  HR:79bpm  TEMP: ( )  RESP:  Physical Exam Vitals and nursing note reviewed.  Constitutional:      General: She is not in acute distress.    Appearance: Normal appearance. She is obese. She is not ill-appearing.  HENT:     Head: Normocephalic and atraumatic.     Right Ear: External ear normal.     Left Ear: External ear normal.  Eyes:     Extraocular Movements: Extraocular movements intact.  Cardiovascular:      Rate and Rhythm: Normal rate.     Pulses: Normal pulses.  Pulmonary:     Effort: Pulmonary effort is normal. No respiratory distress.  Abdominal:     General: There is no distension.     Palpations: Abdomen is soft.  Musculoskeletal:        General: Tenderness present.     Cervical back: Neck supple.     Right lower leg: No edema.     Left lower leg: No edema.     Comments: Patient has good distal strength with no pain over the greater trochanters.  No clonus or focal weakness.  Skin:    Findings: No erythema, lesion or rash.  Neurological:     General: No focal deficit present.     Mental Status: She is alert and oriented to person, place, and time.     Sensory: No sensory deficit.     Motor: No weakness or abnormal muscle tone.     Coordination: Coordination normal.  Psychiatric:        Mood and Affect: Mood normal.        Behavior: Behavior normal.      Imaging: No results found.

## 2024-07-13 NOTE — Procedures (Signed)
 Lumbar Epidural Steroid Injection - Interlaminar Approach with Fluoroscopic Guidance  Patient: Andrea Montes      Date of Birth: 1955-09-01 MRN: [id number] PCP: Rexanne Ingle, MD      Visit Date: 07/13/2024   Universal Protocol:     Consent Given By: the patient  Position: PRONE  Additional Comments: Vital signs were monitored before and after the procedure. Patient was prepped and draped in the usual sterile fashion. The correct patient, procedure, and site was verified.   Injection Procedure Details:   Procedure diagnoses: Lumbar radiculopathy [M54.16]   Meds Administered:  Meds ordered this encounter  Medications   methylPREDNISolone  acetate (DEPO-MEDROL ) injection 40 mg     Laterality: Right  Location/Site:  L4-5  Needle: 4.5 in., 20 ga. Tuohy  Needle Placement: Paramedian epidural  Findings:   -Comments: Excellent flow of contrast into the epidural space.  Procedure Details: Using a paramedian approach from the side mentioned above, the region overlying the inferior lamina was localized under fluoroscopic visualization and the soft tissues overlying this structure were infiltrated with 4 ml. of 1% Lidocaine  without Epinephrine . The Tuohy needle was inserted into the epidural space using a paramedian approach.   The epidural space was localized using loss of resistance along with counter oblique bi-planar fluoroscopic views.  After negative aspirate for air, blood, and CSF, a 2 ml. volume of Isovue -250 was injected into the epidural space and the flow of contrast was observed. Radiographs were obtained for documentation purposes.    The injectate was administered into the level noted above.   Additional Comments:  The patient tolerated the procedure well Dressing: 2 x 2 sterile gauze and Band-Aid    Post-procedure details: Patient was observed during the procedure. Post-procedure instructions were reviewed.  Patient left the clinic in stable  condition.

## 2024-07-14 ENCOUNTER — Telehealth: Payer: Self-pay

## 2024-07-14 ENCOUNTER — Other Ambulatory Visit (HOSPITAL_COMMUNITY): Payer: Self-pay

## 2024-07-14 MED ORDER — ACCU-CHEK GUIDE W/DEVICE KIT
PACK | 0 refills | Status: AC
  Filled 2024-07-14: qty 1, 90d supply, fill #0

## 2024-07-14 NOTE — Telephone Encounter (Signed)
 Patient called and left voicemail on my phone that stated she had pain and discomfort on the left side of back and leg with some numbness after the injection. This morning the discomfort and numbness have disappeared. She is feeling better. I advised she could continue with ice, heat and OTC meds as directed per protocol yesterday after the procedure.

## 2024-07-17 ENCOUNTER — Other Ambulatory Visit (HOSPITAL_COMMUNITY): Payer: Self-pay

## 2024-08-26 ENCOUNTER — Other Ambulatory Visit (INDEPENDENT_AMBULATORY_CARE_PROVIDER_SITE_OTHER): Payer: Self-pay

## 2024-08-26 ENCOUNTER — Ambulatory Visit: Admitting: Orthopedic Surgery

## 2024-08-26 DIAGNOSIS — M5416 Radiculopathy, lumbar region: Secondary | ICD-10-CM

## 2024-08-26 DIAGNOSIS — M25551 Pain in right hip: Secondary | ICD-10-CM | POA: Diagnosis not present

## 2024-08-26 DIAGNOSIS — M48062 Spinal stenosis, lumbar region with neurogenic claudication: Secondary | ICD-10-CM | POA: Diagnosis not present

## 2024-08-26 NOTE — Progress Notes (Signed)
 "  Office Visit Note   Patient: Andrea Montes           Date of Birth: [birth year]           MRN: [id number] Visit Date: 08/26/2024 Requested by: Rexanne Ingle, MD [address] [place],  [place] [address] PCP: Rexanne Ingle, MD  Subjective: Chief Complaint  Patient presents with   Lower Back - Follow-up    HPI: Andrea Montes is a 69 y.o. female who presents to the office reporting continued low back pain.  Patient had lumbar ESI 07/13/2024.  Did get relief from that for about 3 weeks.  Describes that the pain has recurred and is now waking her from sleep at night.  Pain is constant.  At times it is hard for her to walk well.  Feels like the whole right side is swollen.  Reports some new groin pain as well as perineal type pain.  Hard for her to lift that right leg.  She has to go up the steps sideways.  The pain in her groin is new.  Pain medications have not been helpful.  She is interested in surgical intervention.  She has finished a course of physical therapy for her back and shoulder.  Shot before the lumbar ESI in November lasted her for a year.  Most recent 1 again only lasted 3 weeks.  MRI scan does show significant facet arthritis in the lower lumbar spine..                ROS: All systems reviewed are negative as they relate to the chief complaint within the history of present illness.  Patient denies fevers or chills.  Assessment & Plan: Visit Diagnoses:  1. Pain in right hip   2. Lumbar stenosis with neurogenic claudication   3. Lumbar radiculopathy     Plan: Impression is low back pain with degenerative arthritis particular in the facet joints.  Patient has failed nonoperative management.  She is interested in surgical intervention.  Hips are radiographed today and she only has mild degenerative changes and her exam is also not particularly impressive for symptomatic hip arthritis.  She did get better with epidural steroid injection in her back on 2  occasions referred to Dr. Georgina for surgical evaluation.  Follow-up with me as needed.  Follow-Up Instructions: No follow-ups on file.   Orders:  Orders Placed This Encounter  Procedures   XR HIP UNILAT W OR W/O PELVIS 2-3 VIEWS RIGHT   Ambulatory referral to Orthopedic Surgery   No orders of the defined types were placed in this encounter.     Procedures: No procedures performed   Clinical Data: No additional findings.  Objective: Vital Signs: There were no vitals taken for this visit.  Physical Exam:  Constitutional: Patient appears well-developed HEENT:  Head: Normocephalic Eyes:EOM are normal Neck: Normal range of motion Cardiovascular: Normal rate Pulmonary/chest: Effort normal Neurologic: Patient is alert Skin: Skin is warm Psychiatric: Patient has normal mood and affect  Ortho Exam: Ortho exam demonstrates normal gait and alignment.  No nerve root tension signs.  5 out of 5 ankle dorsiflexion plantarflexion quad and hamstring strength.  Not too much groin pain with internal/external rotation of either leg.  She is reporting some deeper perineal type pain with motion.  Mild pain with forward and lateral bending.  No paresthesias L1 S1 bilaterally.  Specialty Comments:  MRI LUMBAR SPINE WITHOUT CONTRAST   TECHNIQUE: Multiplanar, multisequence MR imaging of  the lumbar spine was performed. No intravenous contrast was administered.   COMPARISON:  05/17/2014   FINDINGS: Segmentation:  Standard.   Alignment: Minimal grade 1 anterolisthesis of L4 on L5 and L5 on S1.   Vertebrae: No acute fracture, evidence of discitis, or aggressive bone lesion.   Conus medullaris and cauda equina: Conus extends to the L2 level. Conus and cauda equina appear normal.   Paraspinal and other soft tissues: No acute paraspinal abnormality.   Disc levels:   Disc spaces: Mild disc height loss L5-S1.   T12-L1: No significant disc bulge. No neural foraminal stenosis. No central  canal stenosis.   L1-L2: No significant disc bulge. No neural foraminal stenosis. No central canal stenosis.   L2-L3: Mild broad-based disc bulge. Mild bilateral facet arthropathy. No foraminal or central canal stenosis.   L3-L4: No significant disc bulge. Mild bilateral facet arthropathy. No foraminal or central canal stenosis.   L4-L5: No disc herniation. Severe bilateral facet arthropathy. Moderate central canal stenosis. Bilateral lateral recess stenosis. Mild bilateral foraminal stenosis.   L5-S1: No disc herniation. Severe bilateral facet arthropathy. Mild bilateral foraminal stenosis. No central canal stenosis.   IMPRESSION: 1. At L4-5 there is a no disc herniation. Severe bilateral facet arthropathy. Moderate central canal stenosis. Bilateral lateral recess stenosis. Mild bilateral foraminal stenosis. 2. At L5-S1 there is a no disc herniation. Severe bilateral facet arthropathy. Mild bilateral foraminal stenosis. 3. No acute osseous injury of the lumbar spine.     Electronically Signed   By: Julaine Blanch M.D.   On: 09/21/2023 09:11  Imaging: XR HIP UNILAT W OR W/O PELVIS 2-3 VIEWS RIGHT Result Date: 08/26/2024 AP pelvis lateral right hip radiographs reviewed.  Mild arthritis is present with some joint space narrowing in the right hip.  Also present in the left hip and to a lesser degree.  No acute fracture.    PMFS History: Patient Active Problem List   Diagnosis Date Noted   Urinary symptom or sign 03/12/2023   Hirsutism 07/20/2022   Hyperglycemia due to type 2 diabetes mellitus (HCC) 07/20/2022   Type 2 diabetes mellitus without complication, without long-term current use of insulin  (HCC) 10/01/2018   Trigger finger, right ring finger 09/09/2015   Low back pain radiating to both legs 05/06/2012   Leg length discrepancy 05/06/2012   HYPERTENSION, BENIGN 04/11/2009   LEG EDEMA, BILATERAL 10/08/2008   CHRONIC MIGRAINE W/O AURA W/INTRACTABLE W/SM 03/09/2008    OVARIAN FAILURE 09/02/2007   VITAMIN D  DEFICIENCY 09/02/2007   OBESITY, NOS 10/17/2006   ANEMIA, IRON DEFICIENCY, UNSPEC. 10/17/2006   Carpal tunnel syndrome, right upper limb 10/17/2006   Past Medical History:  Diagnosis Date   Anemia    no current med.   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   GERD (gastroesophageal reflux disease)    Heart murmur    last  echo-  2010   History of blood transfusion    History of shingles    Hyperlipidemia    Hypertension    borderline not on medicaiton at this time   Impingement syndrome of left shoulder region 10/2014   Joint pain    Migraines    Obesity    Rheumatoid arteritis (HCC)    Seasonal allergies    Sleep apnea    uses CPAP nightly   Trigger finger, left middle finger    Vitamin D  deficiency    Wears partial dentures    upper    Family History  Problem Relation Age of Onset   Kidney disease Brother    Hypertension Brother    Leukemia Mother    Heart disease Mother    Stroke Mother    Cancer Mother    Depression Mother    Anxiety disorder Mother    AAA (abdominal aortic aneurysm) Father    Esophageal cancer Maternal Uncle    Hypertension Brother    Colon cancer Neg Hx    Colon polyps Neg Hx    Rectal cancer Neg Hx    Stomach cancer Neg Hx     Past Surgical History:  Procedure Laterality Date   CARPAL TUNNEL RELEASE Left 04/25/2006   CARPAL TUNNEL RELEASE Right 07/15/2018   Procedure:  RIGHT CARPAL TUNNEL RELEASE;  Surgeon: Addie Cordella Hamilton, MD;  Location: [facility] OR;  Service: Orthopedics;  Laterality: Right;   CLOSED MANIPULATION SHOULDER WITH STERIOD INJECTION Left 02/03/2015   Procedure: LEFT  MANIPULATION SHOULDER UNDER ANESTHESIA WITH INJECTION OF STEROID;  Surgeon: Toribio Chancy, MD;  Location: [facility];  Service: Orthopedics;  Laterality: Left;   COLONOSCOPY     COLONOSCOPY WITH PROPOFOL   10/09/2011   POLYPECTOMY     RESECTION DISTAL CLAVICAL Left 11/04/2014   Procedure: RESECTION DISTAL CLAVICAL;  Surgeon: Toribio Chancy, MD;  Location: [facility];  Service: Orthopedics;  Laterality: Left;   ROTATOR CUFF REPAIR  2010   SHOULDER ARTHROSCOPY WITH ROTATOR CUFF REPAIR Right 10/04/2008   SHOULDER ARTHROSCOPY WITH SUBACROMIAL DECOMPRESSION, ROTATOR CUFF REPAIR AND BICEP TENDON REPAIR Left 11/04/2014   Procedure: LEFT SHOULDER ARTHROSCOPY WITH DEBRIDEMENT, DISTAL CLAVICLE EXCISION, ACROMIOPLASTY, ROTATOR CUFF REPAIR AND BICEP TENODESIS;  Surgeon: Toribio Chancy, MD;  Location: [facility];  Service: Orthopedics;  Laterality: Left;   TRIGGER FINGER RELEASE Right 02/03/2015   Procedure: RIGHT LONG FINGER TRIGGER RELEASE;  Surgeon: Toribio Chancy, MD;  Location: [facility];  Service: Orthopedics;  Laterality: Right;   TRIGGER FINGER RELEASE Left 10/10/2017   Procedure: LEFT HAND RELEASE TRIGGER FINGER 3RD LONG FINGER AND CYST EXCISION;  Surgeon: Addie Cordella Hamilton, MD;  Location: [facility];  Service: Orthopedics;  Laterality: Left;   TRIGGER FINGER RELEASE Right 02/16/2021   Procedure: TRIGGER FINGER RELEASE RIGHT RING FINGER;  Surgeon: Addie Cordella Hamilton, MD;  Location: [facility];  Service: Orthopedics;  Laterality: Right;   TUBAL LIGATION     VAGINAL HYSTERECTOMY  2001   partial   Social History   Occupational History   Not on file  Tobacco Use   Smoking status: Never   Smokeless tobacco: Never  Vaping Use   Vaping status:  Never Used  Substance and Sexual Activity   Alcohol use: Not Currently    Comment: occ with a special occ   Drug use: No   Sexual activity: Not on file        "

## 2024-08-27 ENCOUNTER — Encounter: Payer: Self-pay | Admitting: Orthopedic Surgery

## 2024-08-28 ENCOUNTER — Other Ambulatory Visit: Payer: Self-pay

## 2024-08-28 ENCOUNTER — Other Ambulatory Visit (HOSPITAL_COMMUNITY): Payer: Self-pay

## 2024-09-02 ENCOUNTER — Other Ambulatory Visit (HOSPITAL_COMMUNITY): Payer: Self-pay

## 2024-10-01 ENCOUNTER — Encounter: Admitting: Orthopedic Surgery
